# Patient Record
Sex: Male | Born: 1950 | Race: White | Hispanic: No | Smoking: Former smoker
Health system: Southern US, Community
[De-identification: ages and names within clinical notes are randomized; demographics above are authoritative.]

## PROBLEM LIST (undated history)

## (undated) DIAGNOSIS — K7682 Hepatic encephalopathy: Secondary | ICD-10-CM

## (undated) DIAGNOSIS — H3320 Serous retinal detachment, unspecified eye: Secondary | ICD-10-CM

## (undated) DIAGNOSIS — Z992 Dependence on renal dialysis: Secondary | ICD-10-CM

## (undated) DIAGNOSIS — K625 Hemorrhage of anus and rectum: Secondary | ICD-10-CM

## (undated) DIAGNOSIS — N186 End stage renal disease: Secondary | ICD-10-CM

## (undated) DIAGNOSIS — I781 Nevus, non-neoplastic: Secondary | ICD-10-CM

## (undated) DIAGNOSIS — K7290 Hepatic failure, unspecified without coma: Secondary | ICD-10-CM

## (undated) DIAGNOSIS — K922 Gastrointestinal hemorrhage, unspecified: Secondary | ICD-10-CM

## (undated) DIAGNOSIS — I8500 Esophageal varices without bleeding: Secondary | ICD-10-CM

## (undated) DIAGNOSIS — J189 Pneumonia, unspecified organism: Secondary | ICD-10-CM

## (undated) DIAGNOSIS — I10 Essential (primary) hypertension: Secondary | ICD-10-CM

## (undated) DIAGNOSIS — K709 Alcoholic liver disease, unspecified: Secondary | ICD-10-CM

## (undated) HISTORY — PX: OTHER SURGICAL HISTORY: SHX169

## (undated) HISTORY — DX: Hepatic encephalopathy: K76.82

## (undated) HISTORY — DX: Alcoholic liver disease, unspecified: K70.9

## (undated) HISTORY — DX: Dependence on renal dialysis: Z99.2

## (undated) HISTORY — DX: Essential (primary) hypertension: I10

## (undated) HISTORY — DX: Hemorrhage of anus and rectum: K62.5

## (undated) HISTORY — DX: Esophageal varices without bleeding: I85.00

## (undated) HISTORY — DX: Gastrointestinal hemorrhage, unspecified: K92.2

## (undated) HISTORY — PX: ESOPHAGOGASTRODUODENOSCOPY: SHX1529

## (undated) HISTORY — DX: Hepatic failure, unspecified without coma: K72.90

---

## 1999-03-03 ENCOUNTER — Encounter: Payer: Self-pay | Admitting: Gastroenterology

## 1999-03-03 ENCOUNTER — Inpatient Hospital Stay (HOSPITAL_COMMUNITY): Admission: EM | Admit: 1999-03-03 | Discharge: 1999-03-06 | Payer: Self-pay | Admitting: Emergency Medicine

## 1999-03-05 ENCOUNTER — Encounter: Payer: Self-pay | Admitting: Gastroenterology

## 2001-11-30 ENCOUNTER — Encounter: Payer: Self-pay | Admitting: Urology

## 2001-11-30 ENCOUNTER — Ambulatory Visit (HOSPITAL_COMMUNITY): Admission: RE | Admit: 2001-11-30 | Discharge: 2001-11-30 | Payer: Self-pay | Admitting: Urology

## 2001-12-22 ENCOUNTER — Encounter: Admission: RE | Admit: 2001-12-22 | Discharge: 2001-12-22 | Payer: Self-pay | Admitting: Sports Medicine

## 2001-12-22 ENCOUNTER — Encounter: Payer: Self-pay | Admitting: Sports Medicine

## 2001-12-28 ENCOUNTER — Encounter: Admission: RE | Admit: 2001-12-28 | Discharge: 2001-12-28 | Payer: Self-pay | Admitting: Infectious Diseases

## 2002-01-01 ENCOUNTER — Ambulatory Visit (HOSPITAL_COMMUNITY): Admission: RE | Admit: 2002-01-01 | Discharge: 2002-01-01 | Payer: Self-pay | Admitting: Neurosurgery

## 2002-01-03 ENCOUNTER — Inpatient Hospital Stay (HOSPITAL_COMMUNITY): Admission: EM | Admit: 2002-01-03 | Discharge: 2002-01-05 | Payer: Self-pay | Admitting: Emergency Medicine

## 2002-01-03 ENCOUNTER — Encounter: Admission: RE | Admit: 2002-01-03 | Discharge: 2002-01-03 | Payer: Self-pay | Admitting: Infectious Diseases

## 2002-01-04 ENCOUNTER — Encounter: Payer: Self-pay | Admitting: Internal Medicine

## 2002-01-05 ENCOUNTER — Encounter: Payer: Self-pay | Admitting: Internal Medicine

## 2002-02-19 ENCOUNTER — Encounter: Admission: RE | Admit: 2002-02-19 | Discharge: 2002-02-19 | Payer: Self-pay | Admitting: Internal Medicine

## 2002-03-19 ENCOUNTER — Encounter: Admission: RE | Admit: 2002-03-19 | Discharge: 2002-03-19 | Payer: Self-pay | Admitting: Infectious Diseases

## 2003-06-09 ENCOUNTER — Inpatient Hospital Stay (HOSPITAL_COMMUNITY): Admission: AD | Admit: 2003-06-09 | Discharge: 2003-06-11 | Payer: Self-pay | Admitting: Family Medicine

## 2007-03-23 ENCOUNTER — Inpatient Hospital Stay (HOSPITAL_COMMUNITY): Admission: EM | Admit: 2007-03-23 | Discharge: 2007-03-25 | Payer: Self-pay | Admitting: Emergency Medicine

## 2007-03-24 ENCOUNTER — Encounter (INDEPENDENT_AMBULATORY_CARE_PROVIDER_SITE_OTHER): Payer: Self-pay | Admitting: Internal Medicine

## 2007-04-19 ENCOUNTER — Ambulatory Visit: Payer: Self-pay | Admitting: Cardiology

## 2007-07-31 ENCOUNTER — Emergency Department (HOSPITAL_COMMUNITY): Admission: EM | Admit: 2007-07-31 | Discharge: 2007-07-31 | Payer: Self-pay | Admitting: Emergency Medicine

## 2009-09-02 ENCOUNTER — Ambulatory Visit: Payer: Self-pay | Admitting: Cardiology

## 2009-12-01 ENCOUNTER — Inpatient Hospital Stay (HOSPITAL_COMMUNITY)
Admission: EM | Admit: 2009-12-01 | Discharge: 2009-12-06 | Payer: Self-pay | Source: Home / Self Care | Admitting: Emergency Medicine

## 2009-12-02 ENCOUNTER — Ambulatory Visit: Payer: Self-pay | Admitting: Cardiology

## 2009-12-02 ENCOUNTER — Encounter (INDEPENDENT_AMBULATORY_CARE_PROVIDER_SITE_OTHER): Payer: Self-pay | Admitting: Internal Medicine

## 2009-12-10 HISTORY — PX: LUNG SURGERY: SHX703

## 2010-04-13 ENCOUNTER — Telehealth: Payer: Self-pay | Admitting: Gastroenterology

## 2010-08-13 NOTE — Progress Notes (Signed)
Summary: wants asap appt  ---- Converted from flag ---- ---- 04/13/2010 11:00 AM, Cloria Spring LPN wrote: Timothy Chan, i have received two phone calls on this pt this AM. First his son called and wanted him seen TODAY. I told him we do not do same day appts. He said he is having liver problems and was told to call and see Dr. Jena Gauss. He said he thought he needed fluid drawn off of his stomach. (Last seen here in 2001) I told him if he had an immediate problem he should contact his PCP, who his son said was Dr. Sherril Croon. And then if Dr. Sherril Croon thought he should be seen here he could let the doctors know. A little while later, pt's friend, Timothy Chan, called.  She said he was seen in hospital at [facility] approx 4-6 weeks ago. She  said Dr. Karilyn Cota saw him there and said he should follow up with a liver doctor because he was not a liver doctor. She said pt is having problems with BP especially and it is going real low when he has dialysis and they think it is because of his liver and not his dialysis. please advise! ------------------------------   Agree with above. He should f/u with his PCP for urgent needs. We do not do taps, we arrange all via radiology which his PCP can arrange as well. He is a NUR patient, he should consider arranging appt with NUR, cannot flip back and forth between practices. Otherwise, can use an urgent for next week.  Appended Document: wants asap appt Pt was informed.

## 2010-09-04 ENCOUNTER — Inpatient Hospital Stay (HOSPITAL_COMMUNITY): Payer: Medicare Other

## 2010-09-04 ENCOUNTER — Inpatient Hospital Stay (HOSPITAL_COMMUNITY)
Admission: RE | Admit: 2010-09-04 | Discharge: 2010-09-08 | DRG: 377 | Disposition: A | Discharge status: Home / Self Care | Payer: Medicare Other | Source: Other Acute Inpatient Hospital | Attending: Internal Medicine | Admitting: Internal Medicine

## 2010-09-04 DIAGNOSIS — K219 Gastro-esophageal reflux disease without esophagitis: Secondary | ICD-10-CM | POA: Diagnosis present

## 2010-09-04 DIAGNOSIS — Z992 Dependence on renal dialysis: Secondary | ICD-10-CM

## 2010-09-04 DIAGNOSIS — N039 Chronic nephritic syndrome with unspecified morphologic changes: Secondary | ICD-10-CM | POA: Diagnosis present

## 2010-09-04 DIAGNOSIS — J4489 Other specified chronic obstructive pulmonary disease: Secondary | ICD-10-CM | POA: Diagnosis present

## 2010-09-04 DIAGNOSIS — K31811 Angiodysplasia of stomach and duodenum with bleeding: Secondary | ICD-10-CM | POA: Diagnosis present

## 2010-09-04 DIAGNOSIS — D631 Anemia in chronic kidney disease: Secondary | ICD-10-CM | POA: Diagnosis present

## 2010-09-04 DIAGNOSIS — K766 Portal hypertension: Secondary | ICD-10-CM | POA: Diagnosis present

## 2010-09-04 DIAGNOSIS — F17200 Nicotine dependence, unspecified, uncomplicated: Secondary | ICD-10-CM | POA: Diagnosis present

## 2010-09-04 DIAGNOSIS — K2961 Other gastritis with bleeding: Principal | ICD-10-CM | POA: Diagnosis present

## 2010-09-04 DIAGNOSIS — G8929 Other chronic pain: Secondary | ICD-10-CM | POA: Diagnosis present

## 2010-09-04 DIAGNOSIS — D62 Acute posthemorrhagic anemia: Secondary | ICD-10-CM | POA: Diagnosis present

## 2010-09-04 DIAGNOSIS — E119 Type 2 diabetes mellitus without complications: Secondary | ICD-10-CM | POA: Diagnosis present

## 2010-09-04 DIAGNOSIS — M549 Dorsalgia, unspecified: Secondary | ICD-10-CM | POA: Diagnosis present

## 2010-09-04 DIAGNOSIS — K319 Disease of stomach and duodenum, unspecified: Secondary | ICD-10-CM | POA: Diagnosis present

## 2010-09-04 DIAGNOSIS — N186 End stage renal disease: Secondary | ICD-10-CM | POA: Diagnosis present

## 2010-09-04 DIAGNOSIS — Z79899 Other long term (current) drug therapy: Secondary | ICD-10-CM

## 2010-09-04 DIAGNOSIS — Z86711 Personal history of pulmonary embolism: Secondary | ICD-10-CM

## 2010-09-04 DIAGNOSIS — K7030 Alcoholic cirrhosis of liver without ascites: Secondary | ICD-10-CM | POA: Diagnosis present

## 2010-09-04 DIAGNOSIS — J449 Chronic obstructive pulmonary disease, unspecified: Secondary | ICD-10-CM | POA: Diagnosis present

## 2010-09-04 DIAGNOSIS — I120 Hypertensive chronic kidney disease with stage 5 chronic kidney disease or end stage renal disease: Secondary | ICD-10-CM | POA: Diagnosis present

## 2010-09-04 LAB — GLUCOSE, CAPILLARY: Glucose-Capillary: 158 mg/dL — ABNORMAL HIGH (ref 70–99)

## 2010-09-05 HISTORY — PX: ESOPHAGOGASTRODUODENOSCOPY: SHX1529

## 2010-09-05 LAB — COMPREHENSIVE METABOLIC PANEL
ALT: 14 U/L (ref 0–53)
Alkaline Phosphatase: 82 U/L (ref 39–117)
GFR calc non Af Amer: 13 mL/min — ABNORMAL LOW (ref >60)
Potassium: 3.8 mEq/L (ref 3.5–5.1)
Sodium: 134 mEq/L — ABNORMAL LOW (ref 135–145)

## 2010-09-05 LAB — PHOSPHORUS: Phosphorus: 4.3 mg/dL (ref 2.3–4.6)

## 2010-09-05 LAB — GLUCOSE, CAPILLARY
Glucose-Capillary: 94 mg/dL (ref 70–99)
Glucose-Capillary: 106 mg/dL — ABNORMAL HIGH (ref 70–99)
Glucose-Capillary: 206 mg/dL — ABNORMAL HIGH (ref 70–99)

## 2010-09-05 LAB — HEMOGLOBIN AND HEMATOCRIT, BLOOD
HCT: 26.9 % — ABNORMAL LOW (ref 39.0–52.0)
Hemoglobin: 9.1 g/dL — ABNORMAL LOW (ref 13.0–17.0)

## 2010-09-05 LAB — IRON AND TIBC
TIBC: 189 ug/dL — ABNORMAL LOW (ref 215–435)
UIBC: 151 ug/dL

## 2010-09-05 LAB — HEMOGLOBIN A1C: Mean Plasma Glucose: 123 mg/dL — ABNORMAL HIGH (ref <117)

## 2010-09-05 LAB — APTT: aPTT: 32 seconds (ref 24–37)

## 2010-09-05 LAB — LIPID PANEL
HDL: 33 mg/dL — ABNORMAL LOW (ref >39)
Triglycerides: 58 mg/dL (ref <150)

## 2010-09-05 LAB — MAGNESIUM: Magnesium: 1.9 mg/dL (ref 1.5–2.5)

## 2010-09-06 LAB — COMPREHENSIVE METABOLIC PANEL
ALT: 16 U/L (ref 0–53)
BUN: 27 mg/dL — ABNORMAL HIGH (ref 6–23)
CO2: 27 mEq/L (ref 19–32)
Calcium: 8.5 mg/dL (ref 8.4–10.5)
Creatinine, Ser: 5.85 mg/dL — ABNORMAL HIGH (ref 0.4–1.5)
GFR calc non Af Amer: 10 mL/min — ABNORMAL LOW (ref >60)
Glucose, Bld: 141 mg/dL — ABNORMAL HIGH (ref 70–99)
Sodium: 134 mEq/L — ABNORMAL LOW (ref 135–145)

## 2010-09-06 LAB — CBC
HCT: 28.3 % — ABNORMAL LOW (ref 39.0–52.0)
Hemoglobin: 9.5 g/dL — ABNORMAL LOW (ref 13.0–17.0)
MCH: 29.9 pg (ref 26.0–34.0)
MCHC: 33.6 g/dL (ref 30.0–36.0)
MCV: 89 fL (ref 78.0–100.0)

## 2010-09-06 LAB — AMMONIA: Ammonia: 82 umol/L — ABNORMAL HIGH (ref 11–35)

## 2010-09-06 LAB — GLUCOSE, CAPILLARY: Glucose-Capillary: 204 mg/dL — ABNORMAL HIGH (ref 70–99)

## 2010-09-07 ENCOUNTER — Inpatient Hospital Stay (HOSPITAL_COMMUNITY): Payer: Medicare Other

## 2010-09-07 HISTORY — PX: COLONOSCOPY: SHX174

## 2010-09-07 LAB — COMPREHENSIVE METABOLIC PANEL
ALT: 14 U/L (ref 0–53)
AST: 34 U/L (ref 0–37)
Alkaline Phosphatase: 74 U/L (ref 39–117)
CO2: 25 mEq/L (ref 19–32)
Chloride: 100 mEq/L (ref 96–112)
GFR calc Af Amer: 10 mL/min — ABNORMAL LOW (ref >60)
GFR calc non Af Amer: 9 mL/min — ABNORMAL LOW (ref >60)
Sodium: 134 mEq/L — ABNORMAL LOW (ref 135–145)
Total Bilirubin: 1.1 mg/dL (ref 0.3–1.2)

## 2010-09-07 LAB — AMMONIA: Ammonia: 75 umol/L — ABNORMAL HIGH (ref 11–35)

## 2010-09-07 LAB — CBC
Hemoglobin: 9.2 g/dL — ABNORMAL LOW (ref 13.0–17.0)
MCH: 31.9 pg (ref 26.0–34.0)
MCHC: 35.2 g/dL (ref 30.0–36.0)

## 2010-09-08 LAB — GLUCOSE, CAPILLARY: Glucose-Capillary: 163 mg/dL — ABNORMAL HIGH (ref 70–99)

## 2010-09-08 LAB — CBC
HCT: 26.5 % — ABNORMAL LOW (ref 39.0–52.0)
MCV: 91.4 fL (ref 78.0–100.0)
RBC: 2.9 MIL/uL — ABNORMAL LOW (ref 4.22–5.81)
WBC: 5.7 10*3/uL (ref 4.0–10.5)

## 2010-09-09 NOTE — Consult Note (Signed)
NAMEDEVONTAE, CASASOLA NO.:  [id number]  MEDICAL RECORD NO.:  [id number]           PATIENT TYPE:  I  LOCATION:  2604                         FACILITY:  [facility]  PHYSICIAN:  Willis Modena, MD     DATE OF BIRTH:  26-Oct-1950  DATE OF CONSULTATION:  09/05/2010 DATE OF DISCHARGE:                                CONSULTATION   CONSULTING PHYSICIAN:  Rosanna Randy, MD, of [facility].  REASON FOR CONSULTATION:  Guaiac-positive stools with anemia.  CHIEF COMPLAINT:  Weakness.  HISTORY OF PRESENT ILLNESS:  Mr. Zoeller is a 60 year old gentleman with history of alcohol-related cirrhosis.  His history is challenging, but his son helps to provide some of the background.  Apparently, he was diagnosed many years with alcohol-related cirrhosis and had a TIPS procedure done in [place] for refractory ascites over 10 years ago.  He has had some troubles with encephalopathy recently, according to the family, they have partially occluded his TIPS to help improve this.  He presented to [facility], and subsequently was transferred to [facility], for evaluation of black stool with anemia.  Apparently, he was admitted for weakness and found to have a hemoglobin of 7 that was heme positive.  His hemoglobin did not improve appropriately with transfusion, and he was transferred over to [facility].  The patient denies any hematemesis, but the son tells me he has had some episodes of hematemesis as well as his anemia.  He apparently had an endoscopy many years ago.  He also endorses having a colonoscopy.  None of these reports were available for review.  Past medical history, allergies, past surgical history, home medications, family history, social history, and review of systems all from dictated note from Dr. Gwenlyn Perking.  I have reviewed and I agree.  PHYSICAL EXAMINATION:  GENERAL:  Mr. Cozort is chronically ill appearing.  He appears older  than stated age. SKIN:  Patient has some telangiectasias and ecchymoses consistent with cirrhosis. ENT:  Poor dentition.  Sclerae are anicteric. LUNGS:  Clear to auscultation. HEART:  Regular rhythm. ABDOMEN:  Slightly distended, but no appreciable ascites.  No obviously palpable liver or splenic enlargement. EXTREMITIES:  He has AV fistula in his left wrist.  No peripheral cyanosis or clubbing. NEUROLOGIC:  The patient is somewhat somnolent, but re-arousable, diffusely weak, but nonfocal without lateralizing signs. LYMPHATICS:  No palpable axillary, submandibular, or supraclavicular adenopathy.  LABORATORY STUDIES:  Hemoglobin was 9.1 after transfusion, INR 1.23. Sodium 134, potassium 3.8, chloride 93, bicarb 23, BUN 19, creatinine 4.7, total protein 5.1, albumin 1.9, total bilirubin 1.2, alk phos 82, AST 22, ALT 14.  RADIOLOGY:  Chest x-ray done yesterday showed chronic lung disease.  No acute cardiopulmonary disease.  IMPRESSION:  Mr. Cassarino is a 60 year old gentleman with anemia, heme- positive stool.  There is a report of hematemesis, but I cannot get this corroborated.  He is hemodynamically stable.  He is chronically ill from his cirrhosis.  PLAN: 1. Agree with supportive management with gentle hydration, serial CBCs     and proton pump inhibitor therapy. 2. We will proceed  with endoscopy for further evaluation. 3. If his endoscopy is unrevealing, one can consider colonoscopy.     However, we will need to address his encephalopathy and ability to     tolerate a bowel prep further before we pursue this. 4. We will start antibiotics in the setting of GI bleeding in a     patient with cirrhosis.  Thanks again for allowing me to participate in Mr. Russomanno care.     Willis Modena, MD     WO/MEDQ  D:  09/05/2010  T:  09/06/2010  Job:  [id number]  Electronically Signed by Willis Modena  on 09/09/2010 05:43:32 PM

## 2010-09-16 ENCOUNTER — Inpatient Hospital Stay (HOSPITAL_COMMUNITY)
Admission: EM | Admit: 2010-09-16 | Discharge: 2010-09-18 | DRG: 368 | Disposition: A | Discharge status: Home Health | Payer: Medicare Other | Attending: Internal Medicine | Admitting: Internal Medicine

## 2010-09-16 ENCOUNTER — Emergency Department (HOSPITAL_COMMUNITY): Payer: Medicare Other

## 2010-09-16 DIAGNOSIS — K7290 Hepatic failure, unspecified without coma: Secondary | ICD-10-CM | POA: Diagnosis present

## 2010-09-16 DIAGNOSIS — K7682 Hepatic encephalopathy: Secondary | ICD-10-CM | POA: Diagnosis present

## 2010-09-16 DIAGNOSIS — D638 Anemia in other chronic diseases classified elsewhere: Secondary | ICD-10-CM | POA: Diagnosis present

## 2010-09-16 DIAGNOSIS — F1011 Alcohol abuse, in remission: Secondary | ICD-10-CM | POA: Diagnosis present

## 2010-09-16 DIAGNOSIS — N186 End stage renal disease: Secondary | ICD-10-CM | POA: Diagnosis present

## 2010-09-16 DIAGNOSIS — Z86718 Personal history of other venous thrombosis and embolism: Secondary | ICD-10-CM

## 2010-09-16 DIAGNOSIS — K7030 Alcoholic cirrhosis of liver without ascites: Secondary | ICD-10-CM | POA: Diagnosis present

## 2010-09-16 DIAGNOSIS — E119 Type 2 diabetes mellitus without complications: Secondary | ICD-10-CM | POA: Diagnosis present

## 2010-09-16 DIAGNOSIS — M549 Dorsalgia, unspecified: Secondary | ICD-10-CM | POA: Diagnosis present

## 2010-09-16 DIAGNOSIS — K226 Gastro-esophageal laceration-hemorrhage syndrome: Principal | ICD-10-CM | POA: Diagnosis present

## 2010-09-16 DIAGNOSIS — K219 Gastro-esophageal reflux disease without esophagitis: Secondary | ICD-10-CM | POA: Diagnosis present

## 2010-09-16 DIAGNOSIS — G8929 Other chronic pain: Secondary | ICD-10-CM | POA: Diagnosis present

## 2010-09-16 DIAGNOSIS — I120 Hypertensive chronic kidney disease with stage 5 chronic kidney disease or end stage renal disease: Secondary | ICD-10-CM | POA: Diagnosis present

## 2010-09-16 DIAGNOSIS — R040 Epistaxis: Secondary | ICD-10-CM | POA: Diagnosis present

## 2010-09-16 DIAGNOSIS — Z992 Dependence on renal dialysis: Secondary | ICD-10-CM

## 2010-09-16 LAB — COMPREHENSIVE METABOLIC PANEL
ALT: 18 U/L (ref 0–53)
AST: 29 U/L (ref 0–37)
Alkaline Phosphatase: 86 U/L (ref 39–117)
CO2: 23 mEq/L (ref 19–32)
GFR calc Af Amer: 9 mL/min — ABNORMAL LOW (ref >60)
GFR calc non Af Amer: 7 mL/min — ABNORMAL LOW (ref >60)
Glucose, Bld: 139 mg/dL — ABNORMAL HIGH (ref 70–99)
Potassium: 5 mEq/L (ref 3.5–5.1)
Sodium: 139 mEq/L (ref 135–145)
Total Bilirubin: 0.9 mg/dL (ref 0.3–1.2)
Total Protein: 6.2 g/dL (ref 6.0–8.3)

## 2010-09-16 LAB — DIFFERENTIAL
Basophils Absolute: 0.1 10*3/uL (ref 0.0–0.1)
Basophils Relative: 1 % (ref 0–1)
Eosinophils Absolute: 0.2 10*3/uL (ref 0.0–0.7)
Lymphocytes Relative: 36 % (ref 12–46)
Monocytes Relative: 9 % (ref 3–12)
Neutro Abs: 2.5 10*3/uL (ref 1.7–7.7)
Neutrophils Relative %: 50 % (ref 43–77)

## 2010-09-16 LAB — CBC
HCT: 29.9 % — ABNORMAL LOW (ref 39.0–52.0)
Hemoglobin: 9.8 g/dL — ABNORMAL LOW (ref 13.0–17.0)
RBC: 3.16 MIL/uL — ABNORMAL LOW (ref 4.22–5.81)
WBC: 4.9 10*3/uL (ref 4.0–10.5)

## 2010-09-16 LAB — AMMONIA: Ammonia: 77 umol/L — ABNORMAL HIGH (ref 11–35)

## 2010-09-16 LAB — MRSA PCR SCREENING: MRSA by PCR: NEGATIVE

## 2010-09-16 LAB — PROTIME-INR: Prothrombin Time: 14.8 seconds (ref 11.6–15.2)

## 2010-09-16 LAB — HEMOGLOBIN AND HEMATOCRIT, BLOOD: HCT: 27.1 % — ABNORMAL LOW (ref 39.0–52.0)

## 2010-09-17 ENCOUNTER — Inpatient Hospital Stay (HOSPITAL_COMMUNITY): Payer: Medicare Other

## 2010-09-17 DIAGNOSIS — K7030 Alcoholic cirrhosis of liver without ascites: Secondary | ICD-10-CM

## 2010-09-17 DIAGNOSIS — Z8719 Personal history of other diseases of the digestive system: Secondary | ICD-10-CM

## 2010-09-17 DIAGNOSIS — N186 End stage renal disease: Secondary | ICD-10-CM

## 2010-09-17 DIAGNOSIS — K920 Hematemesis: Secondary | ICD-10-CM

## 2010-09-17 LAB — GLUCOSE, CAPILLARY
Glucose-Capillary: 103 mg/dL — ABNORMAL HIGH (ref 70–99)
Glucose-Capillary: 132 mg/dL — ABNORMAL HIGH (ref 70–99)
Glucose-Capillary: 143 mg/dL — ABNORMAL HIGH (ref 70–99)
Glucose-Capillary: 86 mg/dL (ref 70–99)

## 2010-09-17 LAB — HEMOGLOBIN AND HEMATOCRIT, BLOOD
HCT: 26 % — ABNORMAL LOW (ref 39.0–52.0)
HCT: 26.5 % — ABNORMAL LOW (ref 39.0–52.0)
HCT: 25.3 % — ABNORMAL LOW (ref 39.0–52.0)
Hemoglobin: 8.7 g/dL — ABNORMAL LOW (ref 13.0–17.0)
Hemoglobin: 8.7 g/dL — ABNORMAL LOW (ref 13.0–17.0)
Hemoglobin: 8.3 g/dL — ABNORMAL LOW (ref 13.0–17.0)

## 2010-09-17 LAB — BASIC METABOLIC PANEL
BUN: 57 mg/dL — ABNORMAL HIGH (ref 6–23)
Chloride: 110 mEq/L (ref 96–112)
Creatinine, Ser: 7.75 mg/dL — ABNORMAL HIGH (ref 0.4–1.5)
Glucose, Bld: 97 mg/dL (ref 70–99)
Potassium: 4.4 mEq/L (ref 3.5–5.1)

## 2010-09-17 LAB — MAGNESIUM: Magnesium: 2.3 mg/dL (ref 1.5–2.5)

## 2010-09-17 NOTE — Op Note (Signed)
  NAMEGUTHRIE, LEMME                  ACCOUNT NO.:  [id number]  MEDICAL RECORD NO.:  [id number]           PATIENT TYPE:  I  LOCATION:  6730                         FACILITY:  [facility]  PHYSICIAN:  Jebediah Macrae C. Madilyn Fireman, M.D.    DATE OF BIRTH:  1951-01-12  DATE OF PROCEDURE:  09/07/2010 DATE OF DISCHARGE:                              OPERATIVE REPORT   INDICATIONS FOR PROCEDURE:  Heme-positive stool and anemia.  PROCEDURE:  The patient was placed in the left lateral decubitus position and placed on the pulse monitor with continuous low-flow oxygen delivered by nasal cannula.  He was sedated with 50 mcg of IV fentanyl and 4 mg of IV Versed.  Olympus video colonoscope was inserted into the rectum and advanced to the cecum, confirmed by transillumination of McBurney point and visualization of the ileocecal valve and appendiceal orifice.  The prep was fair, but I could not rule out polyps less than 2 cm in all locations.  Otherwise cecum, ascending, transverse, descending, and sigmoid colon all appeared normal with no masses, polyps, diverticula, or other mucosal abnormalities.  The rectum likewise appeared normal and retroflexed view of the anus revealed no obvious internal hemorrhoids.  The scope was then withdrawn and the patient returned to the recovery room in a stable condition.  He tolerated the procedure well and there were no immediate complications.  IMPRESSION:  Normal colonoscopy.  PLAN:  Repeat colonoscopy in 10 years for screening purposes.          ______________________________ Everardo All Madilyn Fireman, M.D.     JCH/MEDQ  D:  09/07/2010  T:  09/08/2010  Job:  [id number]  Electronically Signed by Dorena Cookey M.D. on 09/15/2010 07:10:55 PM

## 2010-09-18 LAB — CBC
HCT: 25 % — ABNORMAL LOW (ref 39.0–52.0)
Hemoglobin: 8.2 g/dL — ABNORMAL LOW (ref 13.0–17.0)
RBC: 2.7 MIL/uL — ABNORMAL LOW (ref 4.22–5.81)
WBC: 5 10*3/uL (ref 4.0–10.5)

## 2010-09-18 LAB — HEMOGLOBIN AND HEMATOCRIT, BLOOD
HCT: 25.1 % — ABNORMAL LOW (ref 39.0–52.0)
Hemoglobin: 8.1 g/dL — ABNORMAL LOW (ref 13.0–17.0)

## 2010-09-18 LAB — BASIC METABOLIC PANEL
CO2: 27 mEq/L (ref 19–32)
GFR calc Af Amer: 14 mL/min — ABNORMAL LOW (ref >60)
Glucose, Bld: 90 mg/dL (ref 70–99)
Potassium: 3.7 mEq/L (ref 3.5–5.1)
Sodium: 139 mEq/L (ref 135–145)

## 2010-09-18 LAB — DIFFERENTIAL
Basophils Absolute: 0.1 10*3/uL (ref 0.0–0.1)
Basophils Relative: 1 % (ref 0–1)
Eosinophils Absolute: 0.2 10*3/uL (ref 0.0–0.7)
Eosinophils Relative: 3 % (ref 0–5)
Lymphs Abs: 1.6 10*3/uL (ref 0.7–4.0)
Neutrophils Relative %: 52 % (ref 43–77)

## 2010-09-18 LAB — GLUCOSE, CAPILLARY: Glucose-Capillary: 89 mg/dL (ref 70–99)

## 2010-09-20 NOTE — Consult Note (Signed)
NAMEANZEL, KEARSE NO.:  [id number]  MEDICAL RECORD NO.:  [id number]           PATIENT TYPE:  LOCATION:                                 FACILITY:  PHYSICIAN:  Jorja Loa, M.D.DATE OF BIRTH:  06-01-1951  DATE OF CONSULTATION: DATE OF DISCHARGE:                                CONSULTATION   The patient is admitted to the hospitalist group.  REASON FOR CONSULTATION:  End-stage renal disease for hemodialysis.  Timothy Chan is a 60 years old gentleman who has history of end-stage renal disease on maintenance hemodialysis Monday, Wednesday, and Friday, history of alcoholic liver cirrhosis, portal hypertension and status post TIPS  placement and removal.  Presently, came with complaints of hematemesis and also dark stools for a couple of days.  Timothy Chan had similar problem in February about 3 weeks ago, where he was admitted to [facility].  During that time, the patient had colonoscopy and also endoscopy.  The only finding was some mild gastritis and the patient was discharged home.  Presently, he came back as stated above with some dark stool and also with cough and he said he has seen some blood with his sputum.  He denies any nausea or vomiting at this moment. He is feeling better.  He said, he did not have any bowel movement today.  He denies any fevers, chills, or sweating.  PAST MEDICAL HISTORY: 1. He has history of alcoholic liver cirrhosis. 2. History of portal hypertension, status post TIPS placement and     removal because of very high ammonia level uncontrolled on his     medication. 3. History of end-stage renal disease on maintenance hemodialysis     Monday, Wednesday, Friday.  Last dialysis was on Monday. 4. History of diabetes. 5. History of PE. 6. History of chronic back pain. 7. History of hypertension. 8. History of COPD. 9. Previous history of osteomyelitis. 10.History of GERD. 11.Occasional recurrent hepatic  encephalopathy. 12.History of anemia.  SURGICAL HISTORY:  He is status post TIPS placement and status post removal.  His medication include insulin sliding scale.  He is also on lactulose 22.5 mL t.i.d.  He is also on Protonix 80 mg IV daily.  He is also on Zofran 4 mg on a p.r.n. basis. as an outpatient.  However, the patient was also on metronidazole 250 mg twice a day, and rifaximin 550 mg one tablet p.o. b.i.d.  He is also getting Fosrenol 1000 mg half tablet p.o. t.i.d. with snack and because of his hypokalemia he is also getting 20 mEq of KCl for dialysis.  He is on 3K bath, and he is on sodium bicarb 650 mg twice a day and folic acid 1 tablet p.o. t.i.d. p.o. daily.  SOCIAL HISTORY:  He has history of smoking, but he has stopped alcohol use or illicit drug use.  FAMILY HISTORY:  History of renal failure.  REVIEW OF SYSTEMS:  He is feeling better, he is asking, have to eat some food.  He does not have any nausea.  No vomiting.  He denies any chest pain.  He  denies also any urgency or frequency.  He denies any shortness of breath, orthopnea, paroxysmal nocturnal dyspnea.  Overall, he said since this morning he did not offer any complaints.  PHYSICAL EXAMINATION:  VITAL SIGNS:  The patient's blood pressure is 126/57, pulse of 75. HEENT:  No conjunctival pallor.  No icterus.  Oral mucosa seems to be moist.  Chest is clear to auscultation. HEART:  Reveals regular rate and rhythm.  No murmur.  No S3. ABDOMEN:  Soft and positive bowel sounds. EXTREMITIES:  No edema.  LABORATORY DATA:  His white blood cell count is 4.9, hemoglobin 8.4, hematocrit 26, seems to slowly declining and platelet is 121.  Sodium 141, potassium 4.4, BUN is 57, creatinine 7.7 and his phosphorus is 6.8. His ammonia level is 77.  ASSESSMENT: 1. Nausea and also vomiting of blood which seems to be getting better.     He had similar issue as stated above in February, he had endoscopy     and colonoscopy,  possible gastritis but no significant finding.     Presently, still his H and H seems to be declining. 2. History of alcoholic liver cirrhosis. 3. History of end-stage renal disease.  He is on maintenance     hemodialysis Monday, Wednesday, Friday.  His last dialysis on     Monday. 4. History of diabetes. 5. History of previous pulmonary embolism and he was on Coumadin.     Presently, he is off. 6. History of  gastroesophageal reflux disease. 7. History of chronic back pain. 8. History of hepatic encephalopathy.  He is on lactulose and also on     p.o. antibiotics. 9. Hyperphosphatemia.  The patient was on Fosrenol 500 mg p.o. his     phosphorus seems to be somewhat high at this moment, he is not on     any binder. 10.History of status post TIPS removal.  RECOMMENDATIONS:  We will make arrangement for the patient to get dialysis.  At this moment, we will try to hold heparin, we will give him some Epogen once the patient is able to eat, probably will put him back on binders.  The patient probably also may need to be put back on other medications once he is able to eat.     Jorja Loa, M.D.    BB/MEDQ  D:  09/17/2010  T:  09/18/2010  Job:  [id number]  Electronically Signed by Jorja Loa M.D. on 09/20/2010 08:35:12 AM

## 2010-09-28 LAB — COMPREHENSIVE METABOLIC PANEL
AST: 25 U/L (ref 0–37)
Albumin: 2.1 g/dL — ABNORMAL LOW (ref 3.5–5.2)
Alkaline Phosphatase: 96 U/L (ref 39–117)
BUN: 44 mg/dL — ABNORMAL HIGH (ref 6–23)
GFR calc Af Amer: 16 mL/min — ABNORMAL LOW (ref >60)
Potassium: 3.9 mEq/L (ref 3.5–5.1)
Total Protein: 6.2 g/dL (ref 6.0–8.3)

## 2010-09-28 LAB — BASIC METABOLIC PANEL
BUN: 43 mg/dL — ABNORMAL HIGH (ref 6–23)
BUN: 50 mg/dL — ABNORMAL HIGH (ref 6–23)
BUN: 52 mg/dL — ABNORMAL HIGH (ref 6–23)
CO2: 18 mEq/L — ABNORMAL LOW (ref 19–32)
CO2: 23 mEq/L (ref 19–32)
Calcium: 8.1 mg/dL — ABNORMAL LOW (ref 8.4–10.5)
Calcium: 8.1 mg/dL — ABNORMAL LOW (ref 8.4–10.5)
Calcium: 8.1 mg/dL — ABNORMAL LOW (ref 8.4–10.5)
Chloride: 113 mEq/L — ABNORMAL HIGH (ref 96–112)
Creatinine, Ser: 4.58 mg/dL — ABNORMAL HIGH (ref 0.4–1.5)
Creatinine, Ser: 5.43 mg/dL — ABNORMAL HIGH (ref 0.4–1.5)
Creatinine, Ser: 5.52 mg/dL — ABNORMAL HIGH (ref 0.4–1.5)
GFR calc Af Amer: 13 mL/min — ABNORMAL LOW (ref >60)
GFR calc Af Amer: 13 mL/min — ABNORMAL LOW (ref >60)
GFR calc non Af Amer: 11 mL/min — ABNORMAL LOW (ref >60)
GFR calc non Af Amer: 12 mL/min — ABNORMAL LOW (ref >60)
GFR calc non Af Amer: 13 mL/min — ABNORMAL LOW (ref >60)
Glucose, Bld: 136 mg/dL — ABNORMAL HIGH (ref 70–99)
Glucose, Bld: 143 mg/dL — ABNORMAL HIGH (ref 70–99)
Glucose, Bld: 156 mg/dL — ABNORMAL HIGH (ref 70–99)
Potassium: 3.3 mEq/L — ABNORMAL LOW (ref 3.5–5.1)

## 2010-09-28 LAB — CROSSMATCH
ABO/RH(D): B NEG
Antibody Screen: NEGATIVE

## 2010-09-28 LAB — RENAL FUNCTION PANEL
Albumin: 1.9 g/dL — ABNORMAL LOW (ref 3.5–5.2)
BUN: 48 mg/dL — ABNORMAL HIGH (ref 6–23)
Calcium: 8.3 mg/dL — ABNORMAL LOW (ref 8.4–10.5)
Creatinine, Ser: 4.6 mg/dL — ABNORMAL HIGH (ref 0.4–1.5)
Glucose, Bld: 124 mg/dL — ABNORMAL HIGH (ref 70–99)
Phosphorus: 4.2 mg/dL (ref 2.3–4.6)
Potassium: 3.3 mEq/L — ABNORMAL LOW (ref 3.5–5.1)

## 2010-09-28 LAB — GLUCOSE, CAPILLARY
Glucose-Capillary: 165 mg/dL — ABNORMAL HIGH (ref 70–99)
Glucose-Capillary: 167 mg/dL — ABNORMAL HIGH (ref 70–99)
Glucose-Capillary: 181 mg/dL — ABNORMAL HIGH (ref 70–99)
Glucose-Capillary: 205 mg/dL — ABNORMAL HIGH (ref 70–99)
Glucose-Capillary: 208 mg/dL — ABNORMAL HIGH (ref 70–99)
Glucose-Capillary: 212 mg/dL — ABNORMAL HIGH (ref 70–99)
Glucose-Capillary: 222 mg/dL — ABNORMAL HIGH (ref 70–99)

## 2010-09-28 LAB — HEPATIC FUNCTION PANEL
ALT: 19 U/L (ref 0–53)
AST: 27 U/L (ref 0–37)
Albumin: 2.2 g/dL — ABNORMAL LOW (ref 3.5–5.2)
Alkaline Phosphatase: 101 U/L (ref 39–117)
Indirect Bilirubin: 0.8 mg/dL (ref 0.3–0.9)
Total Protein: 6.6 g/dL (ref 6.0–8.3)

## 2010-09-28 LAB — CBC
HCT: 24.9 % — ABNORMAL LOW (ref 39.0–52.0)
HCT: 26 % — ABNORMAL LOW (ref 39.0–52.0)
HCT: 27.9 % — ABNORMAL LOW (ref 39.0–52.0)
Hemoglobin: 8.6 g/dL — ABNORMAL LOW (ref 13.0–17.0)
MCHC: 34.9 g/dL (ref 30.0–36.0)
MCHC: 35.7 g/dL (ref 30.0–36.0)
MCV: 93.2 fL (ref 78.0–100.0)
MCV: 95.2 fL (ref 78.0–100.0)
Platelets: 110 10*3/uL — ABNORMAL LOW (ref 150–400)
Platelets: 118 10*3/uL — ABNORMAL LOW (ref 150–400)
Platelets: 119 10*3/uL — ABNORMAL LOW (ref 150–400)
Platelets: 138 10*3/uL — ABNORMAL LOW (ref 150–400)
Platelets: 149 10*3/uL — ABNORMAL LOW (ref 150–400)
RBC: 2.45 MIL/uL — ABNORMAL LOW (ref 4.22–5.81)
RBC: 2.59 MIL/uL — ABNORMAL LOW (ref 4.22–5.81)
RDW: 14.7 % (ref 11.5–15.5)
RDW: 14.8 % (ref 11.5–15.5)
RDW: 15.3 % (ref 11.5–15.5)
RDW: 15.4 % (ref 11.5–15.5)
WBC: 17.1 10*3/uL — ABNORMAL HIGH (ref 4.0–10.5)
WBC: 8.7 10*3/uL (ref 4.0–10.5)
WBC: 9.3 10*3/uL (ref 4.0–10.5)
WBC: 9.5 10*3/uL (ref 4.0–10.5)

## 2010-09-28 LAB — DIFFERENTIAL
Band Neutrophils: 1 % (ref 0–10)
Basophils Absolute: 0.1 10*3/uL (ref 0.0–0.1)
Basophils Absolute: 0.1 10*3/uL (ref 0.0–0.1)
Basophils Absolute: 0.1 10*3/uL (ref 0.0–0.1)
Basophils Relative: 1 % (ref 0–1)
Basophils Relative: 1 % (ref 0–1)
Basophils Relative: 1 % (ref 0–1)
Eosinophils Absolute: 0.1 10*3/uL (ref 0.0–0.7)
Eosinophils Absolute: 0.2 10*3/uL (ref 0.0–0.7)
Eosinophils Relative: 0 % (ref 0–5)
Eosinophils Relative: 1 % (ref 0–5)
Eosinophils Relative: 2 % (ref 0–5)
Eosinophils Relative: 3 % (ref 0–5)
Lymphocytes Relative: 12 % (ref 12–46)
Lymphocytes Relative: 14 % (ref 12–46)
Lymphocytes Relative: 32 % (ref 12–46)
Lymphs Abs: 1.9 10*3/uL (ref 0.7–4.0)
Lymphs Abs: 3.5 10*3/uL (ref 0.7–4.0)
Monocytes Absolute: 1.2 10*3/uL — ABNORMAL HIGH (ref 0.1–1.0)
Monocytes Absolute: 1.8 10*3/uL — ABNORMAL HIGH (ref 0.1–1.0)
Monocytes Relative: 12 % (ref 3–12)
Monocytes Relative: 13 % — ABNORMAL HIGH (ref 3–12)
Monocytes Relative: 2 % — ABNORMAL LOW (ref 3–12)
Neutro Abs: 4 10*3/uL (ref 1.7–7.7)
Neutro Abs: 4.9 10*3/uL (ref 1.7–7.7)
Neutro Abs: 8.2 10*3/uL — ABNORMAL HIGH (ref 1.7–7.7)
Neutrophils Relative %: 44 % (ref 43–77)
Neutrophils Relative %: 60 % (ref 43–77)
Neutrophils Relative %: 63 % (ref 43–77)
Neutrophils Relative %: 84 % — ABNORMAL HIGH (ref 43–77)

## 2010-09-28 LAB — TSH: TSH: 1.621 u[IU]/mL (ref 0.350–4.500)

## 2010-09-28 LAB — CULTURE, BLOOD (ROUTINE X 2)
Culture: NO GROWTH
Report Status: 5312011
Report Status: 5312011

## 2010-09-28 LAB — PROTIME-INR
INR: 1.25 (ref 0.00–1.49)
INR: 1.28 (ref 0.00–1.49)
INR: 1.86 — ABNORMAL HIGH (ref 0.00–1.49)
INR: 2.95 — ABNORMAL HIGH (ref 0.00–1.49)
Prothrombin Time: 15.6 seconds — ABNORMAL HIGH (ref 11.6–15.2)
Prothrombin Time: 15.9 seconds — ABNORMAL HIGH (ref 11.6–15.2)
Prothrombin Time: 30.5 seconds — ABNORMAL HIGH (ref 11.6–15.2)

## 2010-09-28 LAB — LIPID PANEL
Cholesterol: 152 mg/dL (ref 0–200)
LDL Cholesterol: 72 mg/dL (ref 0–99)

## 2010-09-28 LAB — CARDIAC PANEL(CRET KIN+CKTOT+MB+TROPI)
CK, MB: 3 ng/mL (ref 0.3–4.0)
CK, MB: 3.2 ng/mL (ref 0.3–4.0)
Relative Index: INVALID (ref 0.0–2.5)
Total CK: 83 U/L (ref 7–232)
Total CK: 86 U/L (ref 7–232)
Troponin I: 0.02 ng/mL (ref 0.00–0.06)
Troponin I: 0.02 ng/mL (ref 0.00–0.06)

## 2010-09-28 LAB — IRON AND TIBC
Iron: 48 ug/dL (ref 42–135)
Saturation Ratios: 33 % (ref 20–55)
UIBC: 96 ug/dL

## 2010-09-28 LAB — HEPARIN LEVEL (UNFRACTIONATED)
Heparin Unfractionated: 0.21 IU/mL — ABNORMAL LOW (ref 0.30–0.70)
Heparin Unfractionated: 0.34 IU/mL (ref 0.30–0.70)
Heparin Unfractionated: 0.38 IU/mL (ref 0.30–0.70)
Heparin Unfractionated: 0.45 IU/mL (ref 0.30–0.70)
Heparin Unfractionated: 0.98 IU/mL — ABNORMAL HIGH (ref 0.30–0.70)
Heparin Unfractionated: 1.5 IU/mL — ABNORMAL HIGH (ref 0.30–0.70)
Heparin Unfractionated: <0.1 IU/mL — ABNORMAL LOW (ref 0.30–0.70)

## 2010-09-28 LAB — D-DIMER, QUANTITATIVE: D-Dimer, Quant: 0.68 ug/mL-FEU — ABNORMAL HIGH (ref 0.00–0.48)

## 2010-09-28 LAB — BRAIN NATRIURETIC PEPTIDE: Pro B Natriuretic peptide (BNP): 109 pg/mL — ABNORMAL HIGH (ref 0.0–100.0)

## 2010-09-28 LAB — POCT CARDIAC MARKERS

## 2010-09-28 LAB — VITAMIN B12: Vitamin B-12: 726 pg/mL (ref 211–911)

## 2010-09-30 NOTE — Discharge Summary (Signed)
[name], [name] NO.:  [id number]  MEDICAL RECORD NO.:  [id number]           PATIENT TYPE:  I  LOCATION:  6730                         FACILITY:  [facility]  PHYSICIAN:  Clydia Llano, MD       DATE OF BIRTH:  [birth year]  DATE OF ADMISSION:  09/04/2010 DATE OF DISCHARGE:                              DISCHARGE SUMMARY   PRIMARY CARE PHYSICIAN:  Everardo Pacific in [place], [place].  REASON FOR ADMISSION:  GI bleed.  DISCHARGE DIAGNOSES: 1. Gastrointestinal bleed probably secondary to mild gastritis versus     mild gastric antral vascular ectasia. 2. Anemia. 3. End-stage renal disease on hemodialysis. 4. Liver cirrhosis. 5. Status post transjugular intrahepatic portosystemic shunt. 6. History of alcohol abuse. 7. History of diabetes mellitus type 2. 8. Hypertension. 9. Chronic back pain. 10.Chronic obstructive pulmonary disease. 11.History of L1-L2 osteomyelitis in 2003. 12.Gastroesophageal reflux disease.  CONSULTS: 1. Willis Modena, [facility]. 2. [employer].  PROCEDURES: 1. EGD showed:     a.     Mild portal hypertensive gastropathy.     b.     Mild linear antral gastritis versus mild GAVE. 2. Otherwise, normal endoscopy.  No evidence of old or fresh blood. 3. Colonoscopy:  Normal colonoscopy. 4. Radiology, September 04, 2010, chest x-ray showed chronic lung     disease including right basilar pleural scarring and/or chronic     small effusion adjacent airspace disease, appears stable.  BRIEF HISTORY EXAMINATION:  Timothy Chan is 60 year old Caucasian gentleman with history of alcohol-related cirrhosis.  The patient appears to have TIPS procedure done for refractory ascites over 10 years ago.  The patient developed encephalopathy from that and according to the family, they have partially occluded his TIPS to improve his mentation.  The patient presented to [facility] and then subsequently transferred to [facility] after he had dark stools.  The patient admitted at that time for weakness and found to have hemoglobin of 7 and his Hemoccult was positive.  He did have transfusion over there and his hemoglobin did not improve, so transferred over to [facility] for further evaluation.  Upon admission, the patient denies hematemesis, denies fever, denies chills.  BRIEF HOSPITAL COURSE: 1. GI bleed.  The patient admitted to the hospital to telemetry.     Gastroenterology was consulted.  EGD was done on September 05, 2010,     which showed mild antral gastritis versus gastric antral vascular     ectasia and according to GI, this probably can explain the anemia     and Hemoccult positive stools.  The patient also had colonoscopy     which appears to be normal.  GI recommended ciprofloxacin for 7     days because of patient cirrhosis with a concurrent GI bleed.  They     recommend also iron supplementation.  The patient got IV iron with     dialysis and probably he will get iron with dialysis afterwards. 2. Anemia.  The patient's anemia is secondary to end-stage renal     disease exacerbated by recent GI bleed.  The patient did have 2     units of blood transfused in the [facility].  The patient's     hemoglobin was stable during his stay in [facility] with     hemoglobin around 9.  On day of discharge, hemoglobin is 9.2.  GI     recommended iron supplementations to get it with dialysis. 3. Liver cirrhosis.  The patient's cirrhosis is stable.  Continue on     his lactulose.  The patient has TIPS procedure done for refractory     ascites. 4. Diabetes mellitus.  His diabetes is well controlled with hemoglobin     A1c 5.9.  His home medication were continued throughout the     hospital stay and patient CBGs were within the accepted limits. 5. End-stage renal disease.  Nephrology were consulted for a routine     hemodialysis.  The patient was seen by Nephrology, dialyzed twice      while he was in the hospital.  Please note that the patient wants to go home.  He is pushing for that. He will have his dialysis this afternoon and afterwards, he will be able to go home.  The patient refused to stay overnight to check his hemoglobin after his colonoscopy were negative.  DISCHARGE INSTRUCTIONS: 1. Diet, renal carbohydrate-modified diet. 2. Activity as tolerated. 3. Disposition, home.  Follow up with hemodialysis.     Clydia Llano, MD     ME/MEDQ  D:  09/07/2010  T:  09/08/2010  Job:  [id number]  cc:   Dr. Dellie Burns, MD  Electronically Signed by Clydia Llano  on 09/30/2010 07:59:11 PM

## 2010-09-30 NOTE — H&P (Signed)
  Timothy Chan, Timothy Chan NO.:  [id number]  MEDICAL RECORD NO.:  [id number]           PATIENT TYPE:  I  LOCATION:  IC11                          FACILITY:  [facility]  PHYSICIAN:  Houston Siren, MD           DATE OF BIRTH:  04/28/1951  DATE OF ADMISSION:  09/16/2010 DATE OF DISCHARGE:                             HISTORY & PHYSICAL   REASON FOR ADMISSION:  Hematemesis.  ADVANCE DIRECTIVE:  Full code.  PRIMARY CARE PHYSICIAN:  Everardo Pacific in [place], [place].  HISTORY OF PRESENT ILLNESS:  This is a 60 year old male with history of end-stage renal disease, on hemodialysis Monday, Wednesday and Friday, history of alcoholic cirrhosis, status post TIPS procedure over 10 years ago, history of pulmonary embolism off Coumadin 2011, type 2 diabetes, chronic back pain, presented to the emergency room with one episode of hematemesis with cough and fresh blood.  He was not retching prior to this and has no abdominal pain.  He has black stool for the past 2-3 weeks and has been on iron supplement.  He did not take any aspirin or any nonsteroidal anti-inflammatory drugs.  It should be noted that on September 07, 2010, he was admitted to [facility] for GI bleed and at that time, upper and lower endoscopy were done, which showed mild gastritis versus mild GAVE. There were no presence of ulcer and  particularly no esophageal varices.  In the emergency room, hemoglobin was 9.8, which is stable, normal INR, platelet count 120,000.  He remained stable hemodynamically.  His ammonia level is 77.  Abdominal film shows no changes in the right pleural effusion and no other acute finding.  With his end-stage renal disease on hemodialysis, his creatinine is 7.50 and his potassium is 5.0.  Liver function tests were normal.  Hospitalist was asked to admit the patient for upper GI bleed.  Dr. Karilyn Cota was consulted by the emergency room physician and will do consultation tomorrow.  PAST MEDICAL  HISTORY: 1. Pulmonary embolism, last dose Coumadin in December 2011. 2. End-stage renal disease, on dialysis Monday, Wednesday, Friday. 3. Liver cirrhosis, status post transjugular intrahepatic systemic     shunt. 4. History of alcohol abuse, who has not been drinking for over 10     years. 5. Diabetes type 2. 6. Hypertension. 7. Chronic back pain. 8. Anemia of chronic disease.  Baseline hemoglobin 9.0. 9. Emphysema. 10.History of osteomyelitis of L1-L2 in 2003. 11.GERD.  MEDICATIONS:  He is on lactulose, sodium bicarbonate, glipizide 5 mg b.i.d., folic acid, Ultram, Zofran as needed with potassium supplement. This is a partial list.  SOCIAL HISTORY:  He lives in [place], smoke about pack per day. Denies current alcohol or drug abuse.  REVIEW OF SYSTEMS:  Otherwise unremarkable.  PHYSICAL EXAMINATION: Dictation ended at this point.     Houston Siren, MD     PL/MEDQ  D:  09/16/2010  T:  09/16/2010  Job:  [id number]  Electronically Signed by Houston Siren  on 09/30/2010 05:44:57 AM

## 2010-09-30 NOTE — H&P (Signed)
  Timothy Chan, CUMBIE NO.:  [id number]  MEDICAL RECORD NO.:  [id number]           PATIENT TYPE:  I  LOCATION:  IC11                          FACILITY:  [facility]  PHYSICIAN:  Houston Siren, MD           DATE OF BIRTH:  1951-04-11  DATE OF ADMISSION:  09/16/2010 DATE OF DISCHARGE:  LH                             HISTORY & PHYSICAL   ADDENDUM  Dictated H and P was cut off.  This is a re-dictation, continuation of H and P on September 16, 2010.  PHYSICAL EXAMINATION:  VITAL SIGNS:  Blood pressure 120/50, pulse of 80, respiratory rate of 20, temp 97.6. GENERAL:  He is alert and oriented and is in no apparent distress. HEENT:  Sclerae nonicteric.  Speech is fluent.  Tongue is midline. Uvula elevated with phonation. NECK:  Supple.  No lymphadenopathy.  No stridor. CARDIAC:  Revealed S1 and S2, regular.  I did not hear any murmur, rub, or gallop. LUNGS:  Clear.  No wheezes, rales, or any evidence of consolidation. ABDOMEN:  Slightly obese, nondistended, nontender.  Bowel sounds present.  No palpable mass.  No rebound.  Minimal if any evidence of ascites. EXTREMITIES:  Showed 1-2+ edema.  Good distal perfusion with no central or peripheral cyanosis. SKIN:  Warm and dry. MUSCULOSKELETAL:  He has no asterixis. NEUROLOGIC:  Unremarkable. PSYCHIATRIC:  Unremarkable as well.  LABORATORY STUDY:  White count 4900, hemoglobin of 9.8, platelet count of 121,000.  INR of 1.14.  Serum sodium 139, potassium 5.0, glucose of 139, creatinine is 7.50, BUN 54, ammonia level 77, calcium of 8.8, albumin 2.3.  IMPRESSION:  This is a 60 year old male with history of end-stage renal disease on hemodialysis, alcoholic cirrhosis, status post transjugular intrahepatic portosystemic shunt, and diabetes, who presented with upper gastrointestinal bleed.  He has stable hemoglobin and good hemodynamic stability, and I suspect that the amount of upper gastrointestinal bleed is very small.  We will  follow H and H q.6 hours and will transfuse if his hemoglobin is less than 8.  He does have elevated ammonia level and I will give him lactulose q.i.d. if he can tolerate it.  We will hold his iron supplements in case he needs to be rescoped.  We will consult Renal, so he can continue his dialysis as he needs one today. Fortunately, he does not have any esophageal varices and hopefully he will not rebleed.  He is a full code, will be admitted to [facility] Team 1.  He is stable.     Houston Siren, MD     PL/MEDQ  D:  09/16/2010  T:  09/17/2010  Job:  [id number]  Electronically Signed by Houston Siren  on 09/30/2010 05:46:05 AM

## 2010-10-02 ENCOUNTER — Inpatient Hospital Stay (HOSPITAL_COMMUNITY)
Admission: EM | Admit: 2010-10-02 | Discharge: 2010-10-03 | DRG: 811 | Discharge status: Against Medical Advice | Payer: Medicare Other | Attending: Internal Medicine | Admitting: Internal Medicine

## 2010-10-02 ENCOUNTER — Encounter: Payer: Self-pay | Admitting: Internal Medicine

## 2010-10-02 DIAGNOSIS — D649 Anemia, unspecified: Principal | ICD-10-CM | POA: Diagnosis present

## 2010-10-02 DIAGNOSIS — D696 Thrombocytopenia, unspecified: Secondary | ICD-10-CM | POA: Diagnosis present

## 2010-10-02 DIAGNOSIS — E876 Hypokalemia: Secondary | ICD-10-CM | POA: Diagnosis present

## 2010-10-02 DIAGNOSIS — K7030 Alcoholic cirrhosis of liver without ascites: Secondary | ICD-10-CM | POA: Diagnosis present

## 2010-10-02 DIAGNOSIS — I120 Hypertensive chronic kidney disease with stage 5 chronic kidney disease or end stage renal disease: Secondary | ICD-10-CM | POA: Diagnosis present

## 2010-10-02 DIAGNOSIS — E119 Type 2 diabetes mellitus without complications: Secondary | ICD-10-CM | POA: Diagnosis present

## 2010-10-02 DIAGNOSIS — K921 Melena: Secondary | ICD-10-CM | POA: Diagnosis present

## 2010-10-02 DIAGNOSIS — N186 End stage renal disease: Secondary | ICD-10-CM | POA: Diagnosis present

## 2010-10-02 DIAGNOSIS — Z992 Dependence on renal dialysis: Secondary | ICD-10-CM

## 2010-10-02 LAB — COMPREHENSIVE METABOLIC PANEL
AST: 29 U/L (ref 0–37)
CO2: 26 mEq/L (ref 19–32)
Calcium: 8.5 mg/dL (ref 8.4–10.5)
Creatinine, Ser: 4.62 mg/dL — ABNORMAL HIGH (ref 0.4–1.5)
GFR calc Af Amer: 16 mL/min — ABNORMAL LOW (ref >60)
GFR calc non Af Amer: 13 mL/min — ABNORMAL LOW (ref >60)

## 2010-10-02 LAB — DIFFERENTIAL
Basophils Relative: 1 % (ref 0–1)
Eosinophils Absolute: 0.2 10*3/uL (ref 0.0–0.7)
Monocytes Absolute: 0.7 10*3/uL (ref 0.1–1.0)
Monocytes Relative: 12 % (ref 3–12)

## 2010-10-02 LAB — CBC
Hemoglobin: 7.8 g/dL — ABNORMAL LOW (ref 13.0–17.0)
MCH: 30.1 pg (ref 26.0–34.0)
MCHC: 31.7 g/dL (ref 30.0–36.0)
Platelets: 145 10*3/uL — ABNORMAL LOW (ref 150–400)

## 2010-10-02 LAB — GLUCOSE, CAPILLARY: Glucose-Capillary: 331 mg/dL — ABNORMAL HIGH (ref 70–99)

## 2010-10-02 LAB — APTT: aPTT: 32 seconds (ref 24–37)

## 2010-10-02 LAB — PROTIME-INR
INR: 1.14 (ref 0.00–1.49)
Prothrombin Time: 14.6 seconds (ref 11.6–15.2)

## 2010-10-02 NOTE — Telephone Encounter (Signed)
rx called to [facility] in [place]

## 2010-10-02 NOTE — Telephone Encounter (Signed)
Patient called evening of 3/22; out of Dexilant 60 mg daily - just ran out - can't afford it; wants prilosec for reflux.  We will call in an Rx to [facility]

## 2010-10-03 LAB — CBC
MCHC: 32.4 g/dL (ref 30.0–36.0)
Platelets: 135 10*3/uL — ABNORMAL LOW (ref 150–400)
RDW: 17.5 % — ABNORMAL HIGH (ref 11.5–15.5)
WBC: 4.9 10*3/uL (ref 4.0–10.5)

## 2010-10-03 LAB — GLUCOSE, CAPILLARY: Glucose-Capillary: 151 mg/dL — ABNORMAL HIGH (ref 70–99)

## 2010-10-03 LAB — RENAL FUNCTION PANEL
Albumin: 2 g/dL — ABNORMAL LOW (ref 3.5–5.2)
Calcium: 8.3 mg/dL — ABNORMAL LOW (ref 8.4–10.5)
Creatinine, Ser: 5.38 mg/dL — ABNORMAL HIGH (ref 0.4–1.5)
GFR calc Af Amer: 13 mL/min — ABNORMAL LOW (ref >60)
GFR calc non Af Amer: 11 mL/min — ABNORMAL LOW (ref >60)
Phosphorus: 4.1 mg/dL (ref 2.3–4.6)

## 2010-10-03 LAB — DIFFERENTIAL
Basophils Absolute: 0.1 10*3/uL (ref 0.0–0.1)
Eosinophils Absolute: 0.3 10*3/uL (ref 0.0–0.7)
Eosinophils Relative: 5 % (ref 0–5)
Monocytes Absolute: 0.8 10*3/uL (ref 0.1–1.0)

## 2010-10-03 LAB — CROSSMATCH: ABO/RH(D): B NEG

## 2010-10-04 NOTE — H&P (Signed)
NAME:  Timothy Chan, Timothy Chan NO.:  [id number]  MEDICAL RECORD NO.:  [id number]           PATIENT TYPE:  I  LOCATION:  A334                          FACILITY:  [facility]  PHYSICIAN:  Elliot Cousin, M.D.    DATE OF BIRTH:  February 02, 1951  DATE OF ADMISSION:  10/02/2010 DATE OF DISCHARGE:  LH                             HISTORY & PHYSICAL   CHIEF COMPLAINT:  The patient was sent to the emergency department for anemia.  He also reports black stools.  HISTORY OF PRESENT ILLNESS:  The patient is a 60 year old man with a past medical history significant for alcoholic cirrhosis, type 2 diabetes mellitus, end-stage renal disease, and recent evaluation for gastrointestinal bleeding.  He was hospitalized in February 2012 for a heme-positive stool and anemia.  At that time, an upper endoscopy was performed presumably by Dr. Dorena Cookey.  Per the records reviewed, there was only mild gastritis versus mild gastric antral vascular ectasia.  A colonoscopy was performed in February as well and per Dr. Florina Ou report, the patient had a normal colonoscopy on September 08, 2010.  He returned to the hospital on September 16, 2010, with coughing of bloody sputum.  He was evaluated by Dr. Karilyn Cota.  It was believed at that time that the patient may have had a Mallory-Weiss tear versus swallowed blood from epistaxis.  When he was discharged on September 18, 2010, his hemoglobin was 8.1.  The patient was at hemodialysis today in no acute distress.  Apparently, lab work was ordered.  Following the results, the patient was advised to come to the hospital for further evaluation of anemia.  In the emergency department, his hemoglobin is 7.8. Per my discussion with him, he has had no abdominal pain, coughing up of blood, coffee-ground emesis, or any blood in his emesis.  Two to three days ago, he did have one episode of nausea and vomiting, but it was only undigested food that he had eaten 2-3 hours prior.  He  does acknowledge black stools; however, he also agrees that he has had black stools for the past 2 months.  PAST MEDICAL HISTORY: 1. GI bleeding in February 2012, possibly secondary to mild gastritis     versus mild antral vascular ectasia per EGD.  The patient's     colonoscopy was normal. 2. Chronic anemia, secondary to possible chronic GI bleeding and end-     stage renal disease.  His baseline hemoglobin is approximately 8.5     to 9.0 grams. 3. Hematemesis versus hemoptysis, on September 16, 2010, possibly secondary     to Mallory-Weiss tear versus swallowed blood from epistaxis. 4. History of alcohol-induced cirrhosis. 5. Status post TIPS placement and removal in the past. 6. Portal hypertension secondary to cirrhosis. 7. End-stage renal disease, on maintenance hemodialysis Mondays,     Wednesdays, and Fridays. 8. Type 2 diabetes mellitus. 9. History of pulmonary emboli. 10.COPD. 11.Chronic low back pain. 12.Hypertension. 13.Osteomyelitis of L1-L2 vertebrae in June 2003. 14.Gastroesophageal reflux disease. 15.History of hepatic encephalopathy.  MEDICATIONS: 1. Folic acid 1 mg daily. 2. Fosrenol 1000 mg half a tablet with  meals and snacks. 3. Lactulose 60 mL four times daily. 4. Nexium 40 mg daily. 5. Potassium chloride 20 mEq 3 tablets 3 times a week after dialysis. 6. Sodium bicarbonate 650 mg b.i.d. 7. Ultram 50 mg q.4 h p.r.n. pain. 8. Rifaximin 550 mg twice daily (the patient states that he has not     been taking this medication because he cannot financially afford     it). 9. Zinc gluconate daily over-the-counter tablet. 10.Zofran 4 mg every 4 hours as needed for nausea.  ALLERGIES:  THE PATIENT HAS ALLERGY TO MORPHINE, WHICH CAUSES ITCHING.  SOCIAL HISTORY:  The patient is married.  His wife is in [place]. According to him, the authorities will not allow her to come back into the [place].  He has three children.  [place]  He  is disabled.  He is a former Corporate investment banker. He denies alcohol use, although he did have a history of heavy alcohol use.  He denies illicit drug use.  He says that he smokes, but does not inhale less than half a pack of cigarettes daily.  FAMILY HISTORY:  His mother is 55 years of age and has diabetes mellitus and degenerative joint disease.  His father is 28 years of age and has coronary artery disease, status post CABG, and hypertension.  PHYSICAL EXAMINATION:  VITAL SIGNS:  Temperature 97.7, blood pressure 138/66, pulse 91, respiratory rate 20, oxygen saturation 100% on room air.  GENERAL:  The patient is a pleasant, alert, 60 year old Caucasian man, who is currently sitting up in bed, in no acute distress. HEENT:  Head is normocephalic, nontraumatic.  Pupils equal, round, and reactive to light.  Extraocular movements are intact.  Conjunctivae are clear.  Sclerae are white.  Tympanic membranes not examined.  Nasal mucosa is dry.  No sinus tenderness.  Oropharynx reveals a full set of dentures.  Mucous membranes are moist.  No posterior exudates or erythema. NECK:  Supple.  No adenopathy.  No thyromegaly.  No bruit.  No JVD. LUNGS:  Decreased breath sounds in the bases, otherwise clear. HEART:  S1 and S2 with no murmurs, rubs, or gallops. ABDOMEN:  Moderately obese, positive bowel sounds, soft, nontender, and nondistended.  No hepatosplenomegaly.  No masses palpated. GU/RECTAL:  Deferred. EXTREMITIES:  Trace of pedal edema bilaterally.  Pedal pulses barely palpable.  No acute hot red joints.  There is a palpable thrill of his left arm. NEUROLOGIC:  The patient is alert and oriented x3.  Cranial nerves II through XII are intact.  Strength is 5/5 throughout.  Sensation is intact.  ADMISSION LABORATORIES:  PTT 32, PT 14.6, INR 1.14.  Sodium 136, potassium 3.3, chloride 99, CO2 of 26, glucose 198, BUN 18, creatinine 4.62, total bilirubin 0.6, alkaline phosphatase 103, SGOT 29,  SGPT 18, total protein 6.2, albumin 2.2, calcium 8.5.  WBC 5.9, hemoglobin 7.8, hematocrit 24.6, platelet count 145, MCV 95.  ASSESSMENT: 1. Acute on chronic anemia with concomitant melena, which has been     subacute to chronic over the past several months.  The patient has     neither had hematemesis nor bright red blood per rectum.  He has     had black stools for several months, which is not new.  He is     asymptomatic.  His hemoglobin was 8.1 two weeks ago and it is 7.8     today.  The small drop in his hemoglobin could be attributed  to lab variation.  He is treated chronically with a proton pump     inhibitor.  He is hemodynamically stable.  However, given his     medical history, he will be admitted for transfusion and     monitoring for 24-48 hours.  Of note, the patient wants to go     home, but did agree to staying overnight for the transfusion. 2. End-stage renal disease. 3. Type 2 diabetes mellitus.  Apparently, glipizide was discontinued     during the previous hospitalization due to low normal blood sugars.     His venous glucose is currently 198. 4. Chronic hypokalemia.  The patient's serum potassium is 3.3 today. 5. Chronic thrombocytopenia.  His thrombocytopenia is likely from     cirrhosis.  During the review of his past medical history     and previous anemia panel, there appears to be no evidence of     vitamin B12 or folate deficiency.  His platelet count in February     was 109 and today it is 145. 6. Cirrhosis.  He does have a history of hepatic encephalopathy, but     no evidence currently.  He is alert and oriented and cooperative.  PLAN: 1. The patient will be admitted for 24 to 48-hour monitoring.  We will     consult the gastroenterologist only if needed. 2. We will transfuse him 1 unit of packed red blood cells over 4 hours     at a minimum. 3. We will consult Nephrology as needed. 4. We will start intravenous proton pump inhibitor therapy for  24-48     hours before transitioning him to the oral preparation. 5. Supportive treatment. 6. We will start with a full liquid diet and advance as tolerated     particularly if he continues to be asymptomatic. 7. We will check his hemoglobin after transfusion tonight and then     again in the morning. 8. We will start sliding scale NovoLog before each meal and at bedtime     and monitor his capillary blood glucose closely.     Elliot Cousin, M.D.     DF/MEDQ  D:  10/02/2010  T:  10/02/2010  Job:  [id number]  cc:   Lionel December, M.D. Fax: [phone]  Doreen Beam, MD Fax: [phone]  Dr. Everardo Pacific  Electronically Signed by Elliot Cousin M.D. on 10/04/2010 02:15:47 PM

## 2010-10-04 NOTE — Discharge Summary (Signed)
NAMESHERON, Timothy Chan NO.:  [id number]  MEDICAL RECORD NO.:  [id number]           PATIENT TYPE:  I  LOCATION:  A334                          FACILITY:  [facility]  PHYSICIAN:  Elliot Cousin, M.D.    DATE OF BIRTH:  [birth year]  DATE OF ADMISSION:  10/02/2010 DATE OF DISCHARGE:  03/24/2012LH                              DISCHARGE SUMMARY   DISCHARGE DIAGNOSES: 1. The patient left against medical advice. 2. Acute on chronic anemia with concomitant melena. 3. The patient's hemoglobin on admission was 7.8 and following 1 unit     of packed red blood cell transfusion, it was 7.7 at the     time he left. 4. Hepatic cirrhosis. 5. Chronic thrombocytopenia. 6. End stage renal disease.  DISCHARGE MEDICATIONS:  None as the patient left against medical advice.  DISCHARGE DISPOSITION:  The patient left against medical advice.  He did mention that he will follow up with his primary care physician, Dr. Sherril Croon and possibly go to the hospital in [place], [place] for further treatment.  CONSULTATIONS:  Nephrology consultation was pending.  PROCEDURE PERFORMED:  None.  HISTORY OF PRESENT ILLNESS:  The patient is a 60 year old man with a past medical history significant for acute on chronic anemia, recent diagnosis of mild gastritis versus mild antral vascular ectasia in February 2012, cirrhosis, and end-stage renal disease.  He presented to the emergency department yesterday after the dialysis staff found that his hemoglobin was low.  When he presented to the emergency department, his hemoglobin was 7.8.  When he was discharged on September 18, 2010, his hemoglobin was 8.1.  The patient also reported black tarry stools, which he has had on and off for several months.  The patient was admitted for further evaluation and management.  HOSPITAL COURSE:  The patient was restarted on proton pump inhibitor therapy with Protonix.  It was given intravenously, but the plan was  to transition it to the oral preparation after 48 hours.  One 1 unit of packed red blood cells was typed and crossed.  He was transfused over 4 hours.  I decided to transfuse him only 1 unit overnight because he has end-stage renal disease.  CBC was ordered 1 hour after transfusion, however, apparently it was not done.  Following the 1 unit transfusion, the following morning, his hemoglobin was 7.7.  The patient denied any bright red blood per rectum.  He denied hematemesis or coughing up of blood.  He denied abdominal pain or cramping.  He did not have a bowel movement overnight or this morning.  When I informed him that his hemoglobin did not increase, he became upset because he received only 1 unit of blood instead of two overnight.  I explained that I did not want to give him 2 units of blood in the setting of end-stage renal disease because his next scheduled hemodialysis was not until March 25.  I discussed the patient with Dr. Kristian Covey.  He did agree with giving him another unit of packed red blood cells. He went on to say, that if the patient needed emergent dialysis, he  would be aware of it and be prepared if it was needed.  I informed the patient of this, however, he was upset and wanted to leave.  He did not want to wait for another transfusion.  He stated that he will go to the hospital in [place], [place].  The patient decided to leave against medical advice, although I did explain to him that there was a potential source of bleeding that we had not found yet.  He understood, but was adamant about leaving against medical advice.  PHYSICAL EXAMINATION:  VITAL SIGNS:  Temperature 98.3, pulse 87, respiratory rate 16, blood pressure 126/67, oxygen saturation 98% on room air.  CBG 151. LUNGS:  Decreased breath sounds in the bases.  Clear anteriorly. HEART:  S1 and S2 with a soft systolic murmur. ABDOMEN:  Obese, positive bowel sounds, soft, nontender,  nondistended. No hepatosplenomegaly.  No masses palpated. EXTREMITIES:  Trace of pedal edema bilaterally.  Pedal pulses barely palpable. NEUROLOGIC:  The patient is alert and oriented x3.  Cranial nerves II through XII are intact.  Sensation is decreased over the lower extremities.  The patient is able to raise both legs against gravity off the bed.  CURRENT AND DISCHARGE LABORATORY DATA:  WBC 4.9, hemoglobin 7.7, MCV 93.3, platelet count 135.  Sodium 137, potassium 3.7, chloride 104, CO2 of 27, glucose 168, BUN 23, creatinine 5.38, albumin 2.0, calcium 8.3, phosphorus 4.1.     Elliot Cousin, M.D.     DF/MEDQ  D:  10/03/2010  T:  10/03/2010  Job:  [id number]  cc:   Lionel December, M.D. Fax: [phone]  Doreen Beam, MD Fax: [phone]  Electronically Signed by Elliot Cousin M.D. on 10/04/2010 02:29:09 PM

## 2010-10-07 NOTE — Telephone Encounter (Signed)
.  rmr 

## 2010-10-13 NOTE — Consult Note (Signed)
NAMEREISE, Timothy Chan NO.:  [id number]  MEDICAL RECORD NO.:  [id number]           PATIENT TYPE:  LOCATION:                                 FACILITY:  PHYSICIAN:  Lionel December, M.D.    DATE OF BIRTH:  Oct 05, 1950  DATE OF CONSULTATION: DATE OF DISCHARGE:                                CONSULTATION   CHIEF COMPLAINT:  Coughing up blood.  HISTORY OF PRESENT ILLNESS:  This is a 60 year old male with a history of end-stage renal disease on hemodialysis, alcoholic cirrhosis, and diabetes who presented to the hospital with history of coughing up blood one time.  The patient was admitted to the hospital at this time and he has had a stable hemoglobin and H and H q.6 h. has remained stable throughout the course of his hospitalization.  The patient states that he felt a little nauseous on the day of the hospital admission.  He states he coughed up blood 1 time less than a cup full and decided to come to the hospital at that time.  The patient does state he has had black stools for the past 2 weeks; however, the patient has been iron supplement at this time.  Further history reveals that the patient did have a mild nose bleed 2 days prior to the episode of hematemesis at which he states was mild and did not recur again.  The patient was hospitalized in February at [facility] for GI bleed and he did have an upper and lower endoscopy.  At that time, EGD showed mild gastritis versus mild portal gastropathy with no esophageal varices.  The patient had a normal colonoscopy during that hospital stay as well.  PAST MEDICAL HISTORY: 1. End-stage renal disease on dialysis, Monday, Wednesday, and Friday     schedule. 2. Liver cirrhosis status post transjugular intrahepatic systemic     shunt. 3. Diabetes type 2. 4. Hypertension. 5. Anemia of chronic disease with baseline hemoglobin 9.0. 6. GERD. 7. Emphysema.  MEDICATIONS:  The patient is on lactulose, glipizide 5 mg  b.i.d., folic acid, Ultram, Zofran.  SOCIAL HISTORY:  The patient is a former Corporate investment banker, smokes a pack per day, denies current alcohol or drug use.  REVIEW OF SYSTEMS:  Significant for nose bleed 2 days prior.  The patient denies any abdominal pain, change in bowel habits other than black stool for 2 weeks.  PHYSICAL EXAMINATION:  GENERAL:  Alert and oriented x3, no acute distress. VITAL SIGNS:  Temperature 98.1, pulse 70, respirations 14, blood pressure 147/71. HEENT:  Normocephalic and atraumatic.  Pupils equal, round, and reactive to light.  Extraocular muscles are intact.  Throat is clear and dry. NECK:  Supple.  No masses.  No thyromegaly. CARDIOVASCULAR:  Regular rate and rhythm with no murmur appreciated. LUNGS:  Clear to auscultation bilaterally.  No wheezes, rales, or rhonchi. ABDOMEN:  Bowel sounds present.  Mild distention, soft.  No mass present.  No hepatosplenomegaly. EXTREMITIES:  Pulses 2/4 bilaterally.  No pretibial edema.  LABORATORY DATA:  Hemoglobin times last four 9.8, 8.7, 8.4, 8.7; hematocrit most recent 26.5.  Ammonia 77.  RADIOLOGY:  PA, lateral on chest impression, no change in effusion and basilar airspace opacities with chronic abnormality.  IMPRESSION:  This is a 60 year old male with known alcoholic liver disease complicated by ascites and HE who presents with single episode of  hematemesis. He is hemodynamically stable. he does give history of nose bleeds. He therefore could have vomited swallowed blood or he could have Mallory Weiss  tear. He just underwent EGD and TCS at [facility] last month. I do not see any  indication to repeat these studies unless he has active GI bleed.  RECOMMENDATIONS: 1. Serial H/H as you are doing. 2. Will start him back on Xifaxan for chronic HE. We appreciat the opportunity to participate in the care of this gentleman.    Lionel December, M.D.     NR/MEDQ  D:  09/17/2010  T:  09/18/2010  Job:   [id number]  Electronically Signed by Lionel December M.D. on 10/13/2010 09:30:06 AM

## 2010-10-22 NOTE — Discharge Summary (Signed)
NAMEEWART, CARRERA                  ACCOUNT NO.:  [id number]  MEDICAL RECORD NO.:  [id number]           PATIENT TYPE:  I  LOCATION:  IC12                          FACILITY:  [facility]  PHYSICIAN:  Naziah Portee L. Lendell Caprice, MDDATE OF BIRTH:  [birth year]  DATE OF ADMISSION:  09/16/2010 DATE OF DISCHARGE:  03/09/2012LH                              DISCHARGE SUMMARY   DISCHARGE DIAGNOSES: 1. Hematemesis, possibly Mallory-Weiss tear versus swallowed blood     from epistaxis. 2. Acute on chronic anemia. 3. History of alcoholic cirrhosis, currently sober. 4. History of TIPS procedure. 5. Diabetes. 6. Endstage renal disease, on dialysis. 7. Hepatic encephalopathy. 8. History of pulmonary embolus. 9. Chronic back pain. 10.Gastroesophageal reflux disease.  DISCHARGE MEDICATIONS: 1. Folic acid 1 mg daily. 2. Fosrenol 1000 mg one-half tablet with meals and snacks. 3. Lactulose 60 mL p.o. q.i.d. 4. Nexium 40 mg daily. 5. KCl 20 mEq 3 tablets three times a week after dialysis. 6. Sodium bicarbonate 650 mg twice a day. 7. Ultram 50 mg p.o. q.4 h. p.r.n. pain. 8. Xifaxan 550 mg p.o. b.i.d. 9. Zinc gluconate daily (over-the-counter). 10.Zofran 4 mg p.o. q.4 h. p.r.n. nausea. 11.Stop glipizide.  CONDITION:  Stable.  ACTIVITY:  He uses a wheelchair.  FOLLOWUP:  With his primary care physician next week to monitor hemoglobin and diabetes.  Follow up with Dr. Karilyn Cota if bleeding returns.  CONDITION:  Stable.  CONSULTATIONS:  Dr. Kristian Covey and Dr. Karilyn Cota.  PROCEDURES:  Hemodialysis.  Diet should be diabetic.  LABORATORY DATA:  On admission, CBC significant for a hemoglobin of 9.8, hematocrit 29.9, and platelet count 121.  At discharge, his hemoglobin is 8.2, hematocrit 25, and platelet count 93,000.  He did not require transfusion on admission.  Complete metabolic panel significant for a glucose of 139, BUN 54, and creatinine 7.5.  At discharge, his glucose is 90, BUN 26, creatinine 5.9,  magnesium 2.3, phosphorus 6.8, and ammonia 77.  PT and PTT normal.  DIAGNOSTICS:  Acute abdominal series showed no change in right effusion and basilar airspace opacity, most consistent with chronic change.  HISTORY AND HOSPITAL COURSE:  Please see H and P for details.  Mr. Hands is a 60 year old white male with a history of cirrhosis who presented with hematemesis.  He had a recent upper and lower endoscopy last month at [facility].  He also reported black stool, but he is on iron supplementation.  His endoscopy showed gastritis last month. The patient also reported some epistaxis and may have swallowed some blood.  The patient was admitted to step-down.  His hemoglobin dropped slightly, but remained stable for several days.  GI was consulted and felt that since he had no further obvious bleeding and his hemoglobin was stable, he would not require repeat endoscopy.  The patient did have hemodialysis while here.  He has had no further epistaxis, and his hemodynamic monitoring has been stable.  He is requesting discharge.  I will advance his diet as tolerated.  He can be followed up as an outpatient.  His blood glucoses remained 80 to about 110 while off his glipizide,  and I have asked him to hold it unless his blood sugars become more elevated.  His other medical problems remained stable during the hospitalization. Total time on the day of discharge is greater than 30 minutes.     Courage Biglow L. Lendell Caprice, MD     CLS/MEDQ  D:  09/18/2010  T:  09/19/2010  Job:  [id number]  Electronically Signed by Crista Curb MD on 10/22/2010 11:01:49 AM

## 2010-11-17 ENCOUNTER — Ambulatory Visit (INDEPENDENT_AMBULATORY_CARE_PROVIDER_SITE_OTHER): Payer: Medicare Other | Admitting: Internal Medicine

## 2010-11-17 DIAGNOSIS — K625 Hemorrhage of anus and rectum: Secondary | ICD-10-CM

## 2010-11-24 NOTE — Discharge Summary (Signed)
Timothy Chan, Timothy Chan NO.:  [id number]   MEDICAL RECORD NO.:  [id number]          PATIENT TYPE:  INP   LOCATION:  A212                          FACILITY:  [facility]   PHYSICIAN:  Timothy Shipper, MD     DATE OF BIRTH:  09-Feb-1951   DATE OF ADMISSION:  03/23/2007  DATE OF DISCHARGE:  09/13/2008LH                               DISCHARGE SUMMARY   The patient used to see Dr. Kathlene Chan in [place] but no longer has a PMD.   He is also followed by [facility] for liver cirrhosis.   DISCHARGE DIAGNOSES:  1. Pulmonary edema, improved, of unclear etiology.  2. History of hypertension, stable.  3. History of type 2 diabetes, stable.  4. History of alcoholic liver cirrhosis, stable.  5. Renal failure, likely chronic.   Please see the H&P dictated by Dr. Benson Chan for details regarding  patient's presenting illness.   BRIEF HOSPITAL COURSE:  1. Shortness of breath.  This is a 60 year old Caucasian male who      presented with a one-week history of shortness of breath.  He was      evaluated in the emergency department.  He was also giving history      of orthopnea and PND.  The patient underwent chest x-ray which      showed bilateral effusions with associated atelectasis, right worse      than left, and there was evidence for mild interstitial edema.  The      patients BNP unfortunately was checked, not at that day, but the      next day, and it was 111.  The patient was given IV Lasix in the ED      and subsequently admitted with b.i.d. IV Lasix.  The patient      imroved significantly yesterday.  His symptoms had previously      resolved as of yesterday.  Today, he is feeling much, much more      better.  He is very keen on going home.  Echocardiogram was      ordered; however, it was not done for unclear reasons.  Now, this      is the weekend, and the earliest it would be done is Monday.  Since      the patient wants to go home, I  think I will go ahead and discharge      him.  His cardiac enzymes have been negative, so there is really no      urgent need to do this echocardiogram.  BNP today is 52.  Etiology      for the pulmonary edema again is not very clear.  We will discharge      him on once a day dose of Lasix, and we will go ahead and      discontinue his Ace inhibitor.  2. Renal insufficiency.  Creatinine when he presented was 2.2 with a      BUN of 24.  It has gone up a little bit to 29 and 2.7 today.  He is      making urine.  He says he has a history of kidney disease.  Again,      we would like to see improvement in the renal function.  However,      since the patient is feeling so much better and is stable, I will      let him go and have labs done in a few days time.  He tells me that      he has an appointment at [facility] on September 24, and      if he is feeling well, I think it will be reasonable for him to      have the blood work done there.  3. History of diabetes, stable.  He can continue his home medications.  4. 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  Function is stable.  He had      an ultrasound which showed that he has had a TIPS placed in the      past, and that appears to be patent.  No gallstones and fatty      infiltration of the liver was noted.  He also has anemia which      appears to be secondary to chronic disease, and H&H is also stable.   Once again, we are left with a not-so-ideal situation here.  Today is a  weekend.  The patient does not have a PMD on the outside, but he is  symptomatically much better.  He is really keen on going home.  I think  it is reasonable to discharge him, with close followup.  We will give  him the phone number for one of the unassigned physicians who was on, on  September 11th, and he can call their office on Monday and get an  appointment set up pretty quickly.  I will write him prescriptions for  the echocardiogram and a basic  metabolic profile.  I have told him that,  if he at any time has any difficulty passing urine, experiences nausea  or vomiting, worsening shortness of breath, then he is to seek attention  immediately.   DISCHARGE MEDICATIONS:  1. Lasix 40 mg p.o. daily.  2. Glipizide 10 mg b.i.d.  3. He is to discontinue his lisinopril.   OTHER TESTS:  BMET in a week's time, 2D echocardiogram as an outpatient.   FOLLOWUP:  With the unassigned physician for September 11th (Dr.  Erby Chan).  The patient to call for appointment on Monday, September  15th and follow up with [facility] as scheduled on September  24th.   DIET:  Renal, modified carbohydrate.   PHYSICAL ACTIVITY:  No restrictions.   CONSULTATIONS:  No consultations obtained during this admission.   Total time of discharge 35 minutes.      Timothy Shipper, MD  Electronically Signed     GK/MEDQ  D:  03/25/2007  T:  03/25/2007  Job:  [id number]   cc:   Timothy Chan, M.D.

## 2010-11-24 NOTE — H&P (Signed)
NAMEREMINGTON, HIGHBAUGH NO.:  [id number]   MEDICAL RECORD NO.:  [id number]          PATIENT TYPE:  EMS   LOCATION:  ED                            FACILITY:  [facility]   PHYSICIAN:  Marcello Moores, MD   DATE OF BIRTH:  09-28-1950   DATE OF ADMISSION:  03/23/2007  DATE OF DISCHARGE:  LH                              HISTORY & PHYSICAL   PMD:  Unassigned.   CHIEF COMPLAINT:  Shortness of breath of 1 week's duration.   HISTORY OF PRESENT ILLNESS:  Timothy Chan is 60 year old man with a  history of liver cirrhosis, alcohol-related, diabetes mellitus,  hypertension, who came with shortness of breath of 1 week's duration.  According to the patient, for the last 1 week he had this shortness of  breath, which was worsened every day.  He admitted that he has orthopnea  and paroxysmal nocturnal dyspnea.  He has associated cough with whitish  sputum but denied any fever, no chest pain, no palpitation, no  dizziness, no vomiting.  He has not any abdominal or urinary complaints  as well.  The patient stated that he had cirrhosis for the last several  years and he had his creatinine related to his cirrhosis and he believes  that his chronic liver problem is related to his alcohol abuse.  The  patient lives in [place] and he following with Dr. Doyne Keel at [facility]  before but now he has not any primary doctor.   REVIEW OF SYSTEMS:  A 10-point review of systems is as detailed above.  She has not any headache, no neurological or any musculoskeletal  complaints as well.   ALLERGIES:  He has no known drug allergies.   SOCIAL HISTORY:  He lives in [place] and is a current smoker and  still admitted he drinks and denied any drug abuse.   FAMILY HISTORY:  Noncontributory.   PAST MEDICAL HISTORY:  1. Cirrhosis of liver secondary to alcohol abuse.  2. Diabetes mellitus.  Hypertension.   HOME MEDICATIONS:  1. Glipizide 10 mg twice a day.  2. Lisinopril 10 mg once a day.   PHYSICAL  EXAMINATION:  The patient is lying in the bed without any  current respiratory distress, lying comfortably.  VITAL SIGNS:  Blood pressure is 169/74, temperature 98.8 and pulse is  92, respiratory rate is 20 per minute and saturation is 96% on room air.  HEENT:  He has pink conjunctivae.  Nonicteric sclerae.  NECK:  Supple.  CHEST:  He has air entry bilaterally with decreased air entry to  posterior basal area bilaterally, more on the right side.  Otherwise, he  has no rhonchi.  No wheezes.  CVS:  S1-S2 well-heard.  No murmur or gallop appreciated.  ABDOMEN:  Distended, obese, questionable flank dullness.  No area of  tenderness.  Normoactive bowel sounds.  EXTREMITIES:  He has not any pitting pedal or pretibial edema.  Peripheral pulses positive.  CNS:  He is alert and well-oriented.   LABS:  CBC:  White blood cell count is 8.9, hemoglobin is 10.9,  hematocrit 32  and platelet count is 233.  PT/INR is 14/1.1.  CK-MB is  2.3 and troponin is less than 0.05.  On the chemistry, sodium is 142,  potassium is 4.5, chloride is 114 and bicarb 24, glucose 88, BUN 24,  creatinine is 2.2, calcium is 9.  Total protein is 6.6 and albumin is  2.2, AST is 29.  Chest x-ray was done in the emergency room, which  showed bilateral pleural effusion, more on the right, and mild pulmonary  edema as well.   ASSESSMENT:  1. Bilateral pleural effusion with mild pulmonary edema associated      with shortness of breath.  This could be related to his liver      failure but congestive heart failure should be ruled out as well.      Will put him on Lasix and Aldactone and will do tomorrow      echocardiogram.  We will send BNP now and will do tomorrow      ultrasound of the liver and ascites as well as the kidney.  Will      send an ammonia level today and we will do ABG and will send liver      function panel as well.  2. Hypertension, not well-controlled.  Will continue his home      medication, lisinopril, and  the patient will receive also Lasix as      well as Aldactone, and will monitor his vital signs.  3. Liver cirrhosis, from the history alcohol-related.  Will see how      the liver function tests and will see also whether there is ascites      related to this tomorrow by ultrasound of the abdomen.  4. Diabetes mellitus, fairly controlled.  Will put him on sliding      scale sensitive level.   Our further management will depend on our investigation results whether  we will go for congestive heart failure or liver cirrhosis and response  of the patient, and the patient will be put on GI as well as DVT  prophylaxis.      Marcello Moores, MD  Electronically Signed     MT/MEDQ  D:  03/23/2007  T:  03/24/2007  Job:  [id number]

## 2010-11-27 NOTE — Discharge Summary (Signed)
[place]. [facility]  Patient:    Timothy Chan, Timothy Chan Visit Number: [id number] MRN: [id number]          Service Type: MED Location: [phone] Attending Physician:  Madaline Guthrie Dictated by:   Hillery Aldo, M.D. Admit Date:  01/03/2002 Discharge Date: 01/05/2002   CC:         Cristi Loron, M.D.  Rockey Situ. Flavia Shipper., M.D.  [facility], Attn. Dr. Doyne Keel   Discharge Summary  DISCHARGE DIAGNOSES:  1. 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  2. Diskitis.  3. Cirrhosis of the liver, status post transjugular intrahepatic     portosystemic shunt in 2001 secondary to intractable ascites.  4. Diet controlled diabetes.  5. History of gastrointestinal bleed,  6. History of alcohol abuse, abstinence since 1999.  DISCHARGE MEDICATIONS:  1. Vancomycin 2 g IV q.d.  2. Nexium 20 mg p.o. b.i.d.  3. Stool softeners p.r.n. while on pain medications.  4. Percocet 5/325 mg one to two tablets p.o. q.4h p.r.n. back pain.  CONSULTANTS: Dr. Burnice Logan, infectious disease.  HISTORY OF PRESENT ILLNESS: The patient is a 60 year old white male with vertebral osteomyelitis and diskitis dating back for five months.  The patient initially presented with back pain and had multiple evaluations by both multiple hospitals and specialists, and was finally diagnosed back in March 2003.  The patient recently underwent intrathecal contrast CT scanning on December 22, 2001 which showed an L1-L2 narrowing of the interspace and disruption of the anterior margin of the L2 vertebral body with left lateral and anterolateral involvement as well.  This was worse than the CT scan that was obtained on September 21, 2001 at [facility].  The patient was therefore thought to have osteomyelitis and diskitis as well as a paravertebral abscess which was also seen on the CT scan on December 22, 2001.  The patient underwent a CT-guided aspiration of this area on January 01, 2002, which subsequently  grew out Staphylococcus aureus, pan-sensitive.  The patient denied any fever or chills.  He has had back pain, nearly constant and of a throbbing quality, rated 10+/10 at its worse.  The patient states that lying still sometimes partially alleviates the pain.  He also reports bilateral leg numbness for approximately one month.  He has had some gait instability secondary to the numb feeling.  He denies any bowel or bladder incontinence.  He does have decreased appetite, with an approximate 20 pound weight loss in the past five months.  No nausea, vomiting, or diarrhea.  He does have occasional constipation secondary to pain medications.  He does have occasional dizziness as well.  No hematuria but occasional dysuria and hesitancy.  ADMISSION PHYSICAL EXAMINATION:  VITAL SIGNS: Temperature 97.8 degrees, blood pressure 160/80, pulse 85, respirations 20.  O2 saturation 98% on room air.  GENERAL: Well-developed, well-nourished white male in obvious discomfort.  HEENT: Normocephalic, atraumatic.  PERRL.  EOMI.  Oropharynx clear.  NECK: Supple without lymphadenopathy or thyromegaly.  CHEST: Lungs clear to auscultation bilaterally with positive spider angiomata on the anterior chest wall.  HEART: Regular rate and rhythm.  No murmurs, rubs, or gallops.  ABDOMEN: Soft, nontender, nondistended, with positive bowel sounds.  There is questionable hepatosplenomegaly.  EXTREMITIES: No clubbing, edema, or cyanosis.  BACK: The patient is exquisitely tender to mild palpation in the L1-L2 area as well as the left paraspinal area, which is greater than his tenderness on the right.  NEUROLOGIC: Alert and oriented x3.  The patient reports  subjective numbness of bilateral lower extremities, although he can determine sensation to monofilament testing.  There is no asterixis.  The patient does have some mild motor weakness to his bilateral lower extremities but this appears to be secondary to pain  and not an actual neurological impingement.  LABORATORY DATA: Sodium 135, potassium 4.0, chloride 101, bicarbonate 28, glucose 154, BUN 20, creatinine 1.7, calcium 8.9, total protein 8.2, albumin 2.3, AST 23, ALT 14, alkaline phosphatase 213, total bilirubin 0.5.  CBC showed a WBC of 6.0, hemoglobin 9.0, hematocrit 26.4, platelet count 237,000. His erythrocyte sedimentation rate was 125.  His C-reactive protein was 13.1. Urinalysis was negative for nitrites, leukocyte esterase, and ketones.  He did have 30 mg/dl protein and a large amount of hemoglobin.  Hemoglobin A1C was 6.0.  Repeat urinalysis revealed 3-6 rbc/hpf.  The patients PT was 15.5, INR 1.2.  Ferritin was 548, GGT 156, reticulocyte count 1.0%, absolute reticulocyte count 31.2.  Iron was 19, total iron binding capacity 172, per cent saturation 11.  HOSPITAL COURSE: PROBLEM #1 - OSTEOMYELITIS/DISKITIS: The patient was initially treated with empiric Ancef.  The sensitivities returned the following day and showed a pan-sensitive Staphylococcus aureus.  A PICC line was placed secondary to the need for long-term antibiotic therapy for six to eight weeks.  The patients antibiotic regimen was changed to IV vancomycin on January 05, 2002 secondary to need for q.d. dosing as he will have to obtain his IV antibiotics at an outpatient facility.  The pharmacy saw him and dosed his vancomycin.  He is to have a vancomycin trough drawn before the fifth dose is given and once a week after that.  PROBLEM #2 - ANEMIA: The patient has a history of hematuria and did have a large amount of blood in the first urinalysis.  This was inadequate to explain his hemoglobin which dropped from 9.0 to 7.8 on admission.  He has had urological work-up in the past, although it is unclear as to what the source of his urological problem was.  Iron studies revealed a picture of anemia  secondary to chronic disease.  His ferritin was elevated so he was not felt  to benefit from iron supplementation.  His hemoglobin stabilized and on discharge was 8.3.  PROBLEM #3 - LIVER DISEASE: The patient has known cirrhosis, which has been stable.  His elevated GGT was worrisome so an MRI was obtained to rule out seeding of the liver by Staphylococcus aureus.  Once these films were reviewed, the patient was cleared for discharge.  PROBLEM #4 - HYPERGLYCEMIA: The patient maintains his glucose level strictly through dietary control.  He has not required any additional intervention at this point./  PROBLEM #5 - HYPERTENSION: The patient was somewhat hypertensive during this hospital stay but this was felt to be secondary to acute pain.  If it persists he may need an antihypertensive.  PROBLEM #6 - PAIN: The patient had severe back pain and was treated with p.r.n. narcotic analgesics.  He was also fitted for a lumbar corset to help relieve the pressure on the lumbar spine.  PROBLEM #7 - CONSTIPATION: The patient was given stool softeners p.r.n. and will need to take these for as long as he remains on narcotic analgesic medications.  DISPOSITION: The patient is discharged home.  FOLLOW-UP: He is to return to [facility] as an outpatient for daily vancomycin therapy.  He understands he should call the M.D. for any worsening back pain, increased numbness or loss of power in  his legs, bladder or bowel dysfunction, fever or chills, or faintness.  The patient will follow up with Dr. Roxan Hockey at the outpatient clinic in three weeks.  Additionally, Dr. Doyne Keel at [facility] will assume his care on discharge. Dictated by:   Hillery Aldo, M.D. Attending Physician:  Madaline Guthrie DD:  01/07/02 TD:  01/09/02 Job: [id number]

## 2010-11-27 NOTE — Discharge Summary (Signed)
NAME:  Timothy Chan, Timothy Chan                            ACCOUNT NO.:  [id number]   MEDICAL RECORD NO.:  [id number]                   PATIENT TYPE:  INP   LOCATION:  5707                                 FACILITY:  [facility]   PHYSICIAN:  Alfonse Spruce, M.D.               DATE OF BIRTH:  1950-10-29   DATE OF ADMISSION:  06/09/2003  DATE OF DISCHARGE:  06/10/2003                                 DISCHARGE SUMMARY   DISPOSITION:  Discharged against medical advice.   FINAL DIAGNOSIS:  1. Acute pancreatitis.  2. Alcohol abuse with history of liver cirrhosis.  3. Underlying history of hypertension started on beta blocker.  4. History of vascular stent.  5. Renal function impairment.   HISTORY OF PRESENT ILLNESS:  The patient was admitted by Dr. __________ with  abdominal epigastric pain, and he has significant elevation of his lipase  and amylase as well. His amylase was 1,079, lipase was 4,373, and also he  had epigastric pain. He underwent ultrasound which fluid around the pancreas  with underlying pancreatitis with history of drinking beer in the month;  however, the patient is not compliant and reliable and probability of  drinking more than that amount to cause the acute pancreatitis. In the  hospital, the patient was started IV fluids as well as thiamine as well as  monitoring infection, started on Primaxin. The patient subsequently today  called the nurses and told them that he wants to leave, and in spite of  nursing discussion as well as myself - I went and tried to persuade the  patient for continuing the course of treatment for the acute pancreatitis  and discussed his current laboratory and treatment with still the lipase and  amylase was elevated. His blood pressure was high this morning at 204/98;  however, it improved subsequently after he took the Lopressor but still  uncontrolled 162/84, and the patient was planned for the patient planning to  gradually increase his diet to clear  liquid to full liquid to advance to  avoid the nausea and vomiting as well. The patient adamantly and resistant  refused and decided to sign against medical advice.   Laboratory on discharge was sodium 139, potassium 3.8, chloride 102, carbon  dioxide 24, BUN 7, creatinine 1.7, blood sugar 108. His vital signs were  temperature of 98.6, pulse 70, respiratory rate 22, pulse oximetry 97, and  blood pressure 162/84. Discuss with him the results of lab as well with the  serum amylase and lipase slightly improved, however, is not normalized; his  amylase 412 and the lipase 845. His examination was with clinical  improvement, however, and the patient advised to see his physician, Dr.  Wende Crease. The patient stated that he will see him tomorrow. Given him  small prescription for blood pressure to hold until he sees doctor Lopressor  25 mg p.o. b.i.d., 10 tablets, and Protonix 40  mg p.o. q.d. five tablets,  and the patient advised to advance the diet very slow from clear to fluid  liquids to see if he tolerates the intake.                                                Alfonse Spruce, M.D.    Wynn Maudlin  D:  06/11/2003  T:  06/11/2003  Job:  [id number]

## 2010-11-27 NOTE — H&P (Signed)
NAME:  Timothy Chan, Timothy Chan NO.:  [id number]   MEDICAL RECORD NO.:  [id number]                   PATIENT TYPE:  EMS   LOCATION:  MAJO                                 FACILITY:  [facility]   PHYSICIAN:  Renato Battles, M.D.                  DATE OF BIRTH:  [birth year]   DATE OF ADMISSION:  06/09/2003  DATE OF DISCHARGE:                                HISTORY & PHYSICAL   REASON FOR ADMISSION:  Abdominal pain.   HISTORY OF PRESENT ILLNESS:  The patient is a pleasant 60 year old white  male who presented to the emergency room with three-day history of  progressive epigastric pain without radiation, associated with nausea and  vomiting and anorexia.  The pain got much worse today.  The patient was  unable to tolerate it any longer, so was driven down with significant others  to multiple hospitals for admission and treatment.   REVIEW OF SYSTEMS:  CONSTITUTIONAL:  No fevers, chills, or night sweats.  No  weight changes.  GI; Positive for nausea, vomiting, and abdominal pain,  loose bowel movements.  No hematochezia, no hematemesis.  CARDIOPULMONARY:  No chest pain, no cough, no shortness of breath.  GU:  No dysuria,  hematuria, or urinary retention.   PAST MEDICAL HISTORY:  1. Hepatic cirrhosis.  2. Alcoholism.  3. History of pancreatitis.  4. History of spinal abscess.   PAST SURGICAL HISTORY:  1. Liver stent.  2. Vascular stents in lower extremity vessels.   FAMILY HISTORY:  Mother had diabetes.  Father has heart disease.   SOCIAL HISTORY:  He smokes a pack and one-half a day for the last 33 years.  He relapsed back to alcoholism one month ago.  Denies drugs.  He lives in  [place].   HOME MEDICATIONS:  None.   ALLERGIES:  No known drug allergies.   PHYSICAL EXAMINATION:  GENERAL:  Alert and oriented in no acute distress.  VITAL SIGNS:  Temperature 99, heart rate 96, respiratory rate 12, blood  pressure 172/93.  HEENT:  Atraumatic and normocephalic.   Pupils are equal and reactive to  light and accommodation.  The patient has mild jaundice.  He has significant  buckle telangiectasia.  NECK:  No lymphadenopathy, thyromegaly, JVD.  CHEST:  Clear to auscultation bilaterally.  No wheeze, rales, or rhonchi.  Positive bilateral gynecomastia.  HEART:  Regular rate and rhythm.  Cardiac no murmur, gallop, or rub.  ABDOMEN:  Soft, nondistended, and no hepatomegaly.  The patient has mild  tenderness on deep palpation in epigastric area.  There is early abdominal  wall venous dilation.  EXTREMITIES:  No cyanosis, edema, clubbing.   LABORATORY STUDIES:  CBC showed white count of 10.6 with 86% neutrophils.  Hemoglobin was 126 with MCV 95.  Platelets were low at 89.  Amylase was  elevated at 1079.  Lipase was elevated at 1079.  Basic  metabolic panel  showed elevated creatinine at 1.8, elevated glucose at 182.  Liver functions  showed normal __________ 7.2 and albumin 3.1, AST 49, ALT 31.   Chest x-ray showed unchanged heart enlargement.   ASSESSMENT/PLAN:  1. 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  The patient will be on     bowel rest, supportive treatment with Demerol for pain management and     Phenergan for nausea and vomiting.  We are going to rule out any hepatic     obstructive lesion as the cause for this pancreatitis.  I am going to     order hepatic ultrasound.  2. Alcoholism.  The patient will be thiamine and folate.  3. Elevated blood pressure. This is most likely secondary to pain; however,     will put the patient on low-dose beta blocker.  4. Thrombocytopenia.  I am going to just repeat the blood test and monitor.  5. Elevated creatinine.  We are going to hydrate patient and reevaluate.                                                Renato Battles, M.D.    SA/MEDQ  D:  06/09/2003  T:  06/09/2003  Job:  [id number]

## 2010-11-30 ENCOUNTER — Emergency Department (HOSPITAL_COMMUNITY)
Admission: EM | Admit: 2010-11-30 | Discharge: 2010-12-01 | Disposition: A | Discharge status: Home / Self Care | Payer: Medicare Other | Attending: Emergency Medicine | Admitting: Emergency Medicine

## 2010-11-30 DIAGNOSIS — N186 End stage renal disease: Secondary | ICD-10-CM | POA: Insufficient documentation

## 2010-11-30 DIAGNOSIS — K7460 Unspecified cirrhosis of liver: Secondary | ICD-10-CM | POA: Insufficient documentation

## 2010-11-30 DIAGNOSIS — R0602 Shortness of breath: Secondary | ICD-10-CM | POA: Insufficient documentation

## 2010-11-30 DIAGNOSIS — I120 Hypertensive chronic kidney disease with stage 5 chronic kidney disease or end stage renal disease: Secondary | ICD-10-CM | POA: Insufficient documentation

## 2010-11-30 DIAGNOSIS — Z950 Presence of cardiac pacemaker: Secondary | ICD-10-CM | POA: Insufficient documentation

## 2010-11-30 DIAGNOSIS — Z79899 Other long term (current) drug therapy: Secondary | ICD-10-CM | POA: Insufficient documentation

## 2010-11-30 DIAGNOSIS — J40 Bronchitis, not specified as acute or chronic: Secondary | ICD-10-CM | POA: Insufficient documentation

## 2010-11-30 DIAGNOSIS — E119 Type 2 diabetes mellitus without complications: Secondary | ICD-10-CM | POA: Insufficient documentation

## 2010-11-30 LAB — GLUCOSE, CAPILLARY: Glucose-Capillary: 165 mg/dL — ABNORMAL HIGH (ref 70–99)

## 2010-12-01 ENCOUNTER — Encounter (HOSPITAL_BASED_OUTPATIENT_CLINIC_OR_DEPARTMENT_OTHER): Payer: Medicare Other | Admitting: Internal Medicine

## 2010-12-01 ENCOUNTER — Emergency Department (HOSPITAL_COMMUNITY): Payer: Medicare Other

## 2010-12-01 ENCOUNTER — Ambulatory Visit (HOSPITAL_COMMUNITY)
Admission: RE | Admit: 2010-12-01 | Discharge: 2010-12-01 | Disposition: A | Discharge status: Home / Self Care | Payer: Medicare Other | Source: Ambulatory Visit | Attending: Internal Medicine | Admitting: Internal Medicine

## 2010-12-01 DIAGNOSIS — K921 Melena: Secondary | ICD-10-CM | POA: Insufficient documentation

## 2010-12-01 DIAGNOSIS — D509 Iron deficiency anemia, unspecified: Secondary | ICD-10-CM

## 2010-12-01 DIAGNOSIS — K5521 Angiodysplasia of colon with hemorrhage: Secondary | ICD-10-CM

## 2010-12-01 DIAGNOSIS — K922 Gastrointestinal hemorrhage, unspecified: Secondary | ICD-10-CM

## 2010-12-01 DIAGNOSIS — F1020 Alcohol dependence, uncomplicated: Secondary | ICD-10-CM | POA: Insufficient documentation

## 2010-12-01 DIAGNOSIS — K625 Hemorrhage of anus and rectum: Secondary | ICD-10-CM

## 2010-12-01 DIAGNOSIS — N186 End stage renal disease: Secondary | ICD-10-CM | POA: Insufficient documentation

## 2010-12-01 DIAGNOSIS — K7030 Alcoholic cirrhosis of liver without ascites: Secondary | ICD-10-CM | POA: Insufficient documentation

## 2010-12-01 DIAGNOSIS — I789 Disease of capillaries, unspecified: Secondary | ICD-10-CM | POA: Insufficient documentation

## 2010-12-01 DIAGNOSIS — Z992 Dependence on renal dialysis: Secondary | ICD-10-CM | POA: Insufficient documentation

## 2010-12-01 HISTORY — PX: OTHER SURGICAL HISTORY: SHX169

## 2010-12-01 LAB — DIFFERENTIAL
Basophils Absolute: 0.1 10*3/uL (ref 0.0–0.1)
Eosinophils Absolute: 0.1 10*3/uL (ref 0.0–0.7)
Eosinophils Relative: 1 % (ref 0–5)
Lymphs Abs: 1.1 10*3/uL (ref 0.7–4.0)
Monocytes Absolute: 1.1 10*3/uL — ABNORMAL HIGH (ref 0.1–1.0)

## 2010-12-01 LAB — COMPREHENSIVE METABOLIC PANEL
AST: 40 U/L — ABNORMAL HIGH (ref 0–37)
Albumin: 2.1 g/dL — ABNORMAL LOW (ref 3.5–5.2)
Alkaline Phosphatase: 107 U/L (ref 39–117)
CO2: 30 mEq/L (ref 19–32)
Creatinine, Ser: 5.16 mg/dL — ABNORMAL HIGH (ref 0.4–1.5)
Glucose, Bld: 187 mg/dL — ABNORMAL HIGH (ref 70–99)
Sodium: 137 mEq/L (ref 135–145)
Total Bilirubin: 0.6 mg/dL (ref 0.3–1.2)

## 2010-12-01 LAB — CBC
MCHC: 31.9 g/dL (ref 30.0–36.0)
MCV: 96.2 fL (ref 78.0–100.0)
Platelets: 113 10*3/uL — ABNORMAL LOW (ref 150–400)
RDW: 16.6 % — ABNORMAL HIGH (ref 11.5–15.5)
WBC: 5.1 10*3/uL (ref 4.0–10.5)

## 2010-12-07 ENCOUNTER — Emergency Department (HOSPITAL_COMMUNITY): Payer: Medicare Other

## 2010-12-07 ENCOUNTER — Emergency Department (HOSPITAL_COMMUNITY)
Admission: EM | Admit: 2010-12-07 | Discharge: 2010-12-07 | Disposition: A | Discharge status: Home / Self Care | Payer: Medicare Other | Attending: Emergency Medicine | Admitting: Emergency Medicine

## 2010-12-07 DIAGNOSIS — J40 Bronchitis, not specified as acute or chronic: Secondary | ICD-10-CM | POA: Insufficient documentation

## 2010-12-07 DIAGNOSIS — R05 Cough: Secondary | ICD-10-CM | POA: Insufficient documentation

## 2010-12-07 DIAGNOSIS — R0602 Shortness of breath: Secondary | ICD-10-CM | POA: Insufficient documentation

## 2010-12-07 DIAGNOSIS — K7460 Unspecified cirrhosis of liver: Secondary | ICD-10-CM | POA: Insufficient documentation

## 2010-12-07 DIAGNOSIS — Z992 Dependence on renal dialysis: Secondary | ICD-10-CM | POA: Insufficient documentation

## 2010-12-07 DIAGNOSIS — E119 Type 2 diabetes mellitus without complications: Secondary | ICD-10-CM | POA: Insufficient documentation

## 2010-12-07 DIAGNOSIS — I120 Hypertensive chronic kidney disease with stage 5 chronic kidney disease or end stage renal disease: Secondary | ICD-10-CM | POA: Insufficient documentation

## 2010-12-07 DIAGNOSIS — N186 End stage renal disease: Secondary | ICD-10-CM | POA: Insufficient documentation

## 2010-12-07 DIAGNOSIS — R059 Cough, unspecified: Secondary | ICD-10-CM | POA: Insufficient documentation

## 2010-12-07 LAB — DIFFERENTIAL
Basophils Relative: 1 % (ref 0–1)
Eosinophils Absolute: 0.3 10*3/uL (ref 0.0–0.7)
Eosinophils Relative: 4 % (ref 0–5)
Monocytes Absolute: 0.8 10*3/uL (ref 0.1–1.0)
Monocytes Relative: 10 % (ref 3–12)

## 2010-12-07 LAB — BASIC METABOLIC PANEL
BUN: 39 mg/dL — ABNORMAL HIGH (ref 6–23)
CO2: 24 mEq/L (ref 19–32)
Calcium: 8.8 mg/dL (ref 8.4–10.5)
Chloride: 102 mEq/L (ref 96–112)
Creatinine, Ser: 7.49 mg/dL — ABNORMAL HIGH (ref 0.4–1.5)

## 2010-12-07 LAB — CBC
Hemoglobin: 7.5 g/dL — ABNORMAL LOW (ref 13.0–17.0)
MCH: 30.4 pg (ref 26.0–34.0)
MCHC: 32.2 g/dL (ref 30.0–36.0)
MCV: 94.3 fL (ref 78.0–100.0)
Platelets: 157 10*3/uL (ref 150–400)
RBC: 2.47 MIL/uL — ABNORMAL LOW (ref 4.22–5.81)

## 2010-12-10 ENCOUNTER — Encounter (HOSPITAL_BASED_OUTPATIENT_CLINIC_OR_DEPARTMENT_OTHER): Payer: Medicare Other | Admitting: Internal Medicine

## 2010-12-10 ENCOUNTER — Ambulatory Visit (HOSPITAL_COMMUNITY)
Admission: RE | Admit: 2010-12-10 | Discharge: 2010-12-10 | Disposition: A | Discharge status: Home / Self Care | Payer: Medicare Other | Source: Ambulatory Visit | Attending: Internal Medicine | Admitting: Internal Medicine

## 2010-12-10 DIAGNOSIS — D509 Iron deficiency anemia, unspecified: Secondary | ICD-10-CM

## 2010-12-10 DIAGNOSIS — Z79899 Other long term (current) drug therapy: Secondary | ICD-10-CM | POA: Insufficient documentation

## 2010-12-10 DIAGNOSIS — K31811 Angiodysplasia of stomach and duodenum with bleeding: Secondary | ICD-10-CM

## 2010-12-10 DIAGNOSIS — D649 Anemia, unspecified: Secondary | ICD-10-CM | POA: Insufficient documentation

## 2010-12-10 DIAGNOSIS — K922 Gastrointestinal hemorrhage, unspecified: Secondary | ICD-10-CM

## 2010-12-10 LAB — CBC
MCH: 29.9 pg (ref 26.0–34.0)
MCV: 92.6 fL (ref 78.0–100.0)
Platelets: 156 10*3/uL (ref 150–400)
RDW: 15.8 % — ABNORMAL HIGH (ref 11.5–15.5)

## 2010-12-10 LAB — GLUCOSE, CAPILLARY
Glucose-Capillary: 341 mg/dL — ABNORMAL HIGH (ref 70–99)
Glucose-Capillary: 352 mg/dL — ABNORMAL HIGH (ref 70–99)

## 2010-12-10 LAB — HEMOGLOBIN AND HEMATOCRIT, BLOOD: HCT: 27.3 % — ABNORMAL LOW (ref 39.0–52.0)

## 2010-12-10 LAB — BASIC METABOLIC PANEL
BUN: 42 mg/dL — ABNORMAL HIGH (ref 6–23)
CO2: 26 mEq/L (ref 19–32)
Calcium: 8.9 mg/dL (ref 8.4–10.5)
Creatinine, Ser: 5.18 mg/dL — ABNORMAL HIGH (ref 0.4–1.5)
Glucose, Bld: 280 mg/dL — ABNORMAL HIGH (ref 70–99)

## 2010-12-11 LAB — CROSSMATCH: Unit division: 0

## 2010-12-21 NOTE — Op Note (Signed)
  NAMEKEY, CEN NO.:  [id number]  MEDICAL RECORD NO.:  [id number]           PATIENT TYPE:  O  LOCATION:  DAYP                          FACILITY:  [facility]  PHYSICIAN:  Lionel December, M.D.    DATE OF BIRTH:  02-Dec-1950  DATE OF PROCEDURE:  12/01/2010 DATE OF DISCHARGE:  12/01/2010                              OPERATIVE REPORT   PROCEDURE:  Small bowel Given capsule study.  INDICATION:  Timothy Chan is a 60 year old Caucasian male with history of alcoholic cirrhosis complicated by hepatic encephalopathy and ascites as well as end-stage renal disease who is on hemodialysis.  He has been experiencing recurrent GI bleed and need for transfusion.  He apparently had EGD and colonoscopy within the last 8-10 weeks at [facility].  He previously has been evaluated at [facility] in [place], [place], but presently not on list.  He was recently hospitalized at [facility] and transfused.  The patient's son, Timothy Chan, talked with Dr. Fulton Mole, patient's hepatologist at [facility], [place], and she recommended further evaluation with Given capsule study.  Procedure risks were reviewed with the patient.  Informed consent was obtained.  FINDINGS:  The patient was able to swallow Given capsule without any difficulty.  Capsule reached the stomach in 45 minutes and in the bulb in 11 minutes and 31 seconds, and reached ileocecal valve in 3 hours 47 minutes and 11 seconds period, it was an 8-hour study.  Course review of esophageal mucosa revealed no varices.  In the stomach, there are areas of mucosa covered in fresh blood and numerous telangiectasia.  Proximal part of small bowel had coffee-ground material.  Distally, mucosa was normal.  Capsule reached colon in 3 hours and 47 minutes with view of colonic mucosa was very limited because of stool.  FINAL DIAGNOSES: 1. Examination of small bowel is very limited because of coffee-     ground, felt to be originating from  stomach. 2. 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     Suspect these changes are due to severe portal gastropathy.  RECOMMENDATIONS: 1. These findings were reviewed with the patient's son, Timothy Chan     over the phone. 2. I recommended that if he develop postal symptoms or evidence of     active bleeding, he should preferably be taken to [facility] and they can     decide if they could treat him with argon plasma coagulator.     Unfortunately,     treatment options are very limited. 3. If patient does not want to go back to [facility], he can consider coming     to [facility] in [place] or go to [facility] and we would     offer him argon plasma, we will offer him endoscopic therapy with     APC.          ______________________________ Lionel December, M.D.     NR/MEDQ  D:  12/03/2010  T:  12/04/2010  Job:  [id number]  cc:   Dr. Sherril Croon  Electronically Signed by Lionel December M.D. on 12/21/2010 02:14:04 PM

## 2010-12-21 NOTE — Op Note (Signed)
[name], Chan                  ACCOUNT NO.:  [id number]  MEDICAL RECORD NO.:  [id number]           PATIENT TYPE:  O  LOCATION:  A322                          FACILITY:  [facility]  PHYSICIAN:  Lionel December, M.D.    DATE OF BIRTH:  Nov 28, 1950  DATE OF PROCEDURE:  12/10/2010 DATE OF DISCHARGE:  12/10/2010                              OPERATIVE REPORT   PROCEDURE:  Esophagogastroduodenoscopy with APC ablation of gastric antral vascular ectasia.  INDICATION:  Timothy Chan is a 60 year old Caucasian male with multiple medical problems including alcoholic cirrhosis who has been requiring frequent transfusions.  He has had EGD and colonoscopy at other institutions.  He had a Given capsule by me last week which shows multiple telangiectasia in his stomach, some covered with fresh blood.  He is therefore admitted losing blood from his stomach.  I reviewed the findings with the patient and his son and also talked with Jim Like, patient's hepatologist at [facility] and we felt that he would benefit from [facility] therapy.  Because the patient lives in [place], it would be difficult for him to travel back-and-forth to [facility] and he is therefore undergoing this therapy at this facility.  Procedure risks were reviewed with the patient. Informed consent was obtained.  Please note, the patient's hemoglobin this morning is 7.3.  His INR is 1.12 and his platelet count is 155,000.  MEDICATIONS FOR CONSCIOUS SEDATION:  Cetacaine spray for pharyngeal topical anesthesia, Demerol 50 mg IV, and Versed 6 mg IV.  FINDINGS:  Procedure performed in endoscopy suite.  The patient's vital signs and O2 saturations were monitored during the procedure and remained stable.  The patient was placed in left lateral recumbent position and Pentax videoscope was passed via oropharynx without any difficulty into esophagus.  Esophagus:  Mucosa of the esophagus was normal except distally there were 2 patches of pink mucosa  suspicious for short segment Barrett's. This was left alone.  No varices were identified.  GE junction was at 43 cm from the incisors.  Stomach:  It was empty and distended very well with insufflation.  No blood was noted in the stomach.  There was mucosal edema at gastric body with few red spots best seen on retroflex view.  However, in the antrum, there were multiple bright red telangiectasia i.e., watermelon stomach. None of these were bleeding.  Pictures were taken for the record. Angularis also had few of these lesions.  Fundus and cardia were examined and no varices were identified.  Duodenum:  Bulbar and postbulbar mucosa was normal.  Endoscope was pulled back in the stomach and the procedure with APC therapy of these lesions.  More than 50% of these lesions treated, some bled transiently, one on application of energy stopped bleeding. Pictures were taken post APC therapy for the record.  Endoscope was withdrawn.  The patient tolerated the procedure well.  FINAL DIAGNOSES: 1. 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 2. Mild changes of portal gastropathy. 3. Gastric antral vascular ectasia.  More than 50% of these lesions     were ablated with argon plasma coagulator. 4. No evidence of esophageal  and gastric varices or peptic ulcer     disease.  RECOMMENDATIONS: 1. The patient will be admitted as an observation and he will be given     2 units of PRBCs before he goes home. 2. The patient was advised not to take any OTC NSAIDs or aspirin. 3. We will bring him back in 2-3 weeks for repeat therapy.          ______________________________ Lionel December, M.D.     NR/MEDQ  D:  12/10/2010  T:  12/10/2010  Job:  [id number]  cc:   Dr. Barnett Abu, [facility] [place], [place] [facility], [place], [place]  Electronically Signed by Lionel December M.D. on 12/21/2010 02:14:17 PM

## 2010-12-31 ENCOUNTER — Encounter (HOSPITAL_BASED_OUTPATIENT_CLINIC_OR_DEPARTMENT_OTHER): Payer: Medicare Other | Admitting: Internal Medicine

## 2010-12-31 ENCOUNTER — Ambulatory Visit (HOSPITAL_COMMUNITY)
Admission: RE | Admit: 2010-12-31 | Discharge: 2010-12-31 | Disposition: A | Discharge status: Home / Self Care | Payer: Medicare Other | Source: Ambulatory Visit | Attending: Internal Medicine | Admitting: Internal Medicine

## 2010-12-31 DIAGNOSIS — K259 Gastric ulcer, unspecified as acute or chronic, without hemorrhage or perforation: Secondary | ICD-10-CM | POA: Insufficient documentation

## 2010-12-31 DIAGNOSIS — K31811 Angiodysplasia of stomach and duodenum with bleeding: Secondary | ICD-10-CM | POA: Insufficient documentation

## 2010-12-31 DIAGNOSIS — N186 End stage renal disease: Secondary | ICD-10-CM | POA: Insufficient documentation

## 2010-12-31 DIAGNOSIS — Z992 Dependence on renal dialysis: Secondary | ICD-10-CM | POA: Insufficient documentation

## 2010-12-31 DIAGNOSIS — K922 Gastrointestinal hemorrhage, unspecified: Secondary | ICD-10-CM

## 2010-12-31 DIAGNOSIS — K31819 Angiodysplasia of stomach and duodenum without bleeding: Secondary | ICD-10-CM

## 2010-12-31 DIAGNOSIS — K7460 Unspecified cirrhosis of liver: Secondary | ICD-10-CM

## 2010-12-31 DIAGNOSIS — E119 Type 2 diabetes mellitus without complications: Secondary | ICD-10-CM | POA: Insufficient documentation

## 2010-12-31 HISTORY — PX: ESOPHAGOGASTRODUODENOSCOPY: SHX1529

## 2011-01-19 NOTE — Op Note (Signed)
NAMEOSIE, AMPARO NO.:  [id number]  MEDICAL RECORD NO.:  [id number]  LOCATION:  DAYP                          FACILITY:  [facility]  PHYSICIAN:  Lionel December, M.D.    DATE OF BIRTH:  1950/10/07  DATE OF PROCEDURE:  12/31/2010 DATE OF DISCHARGE:                              OPERATIVE REPORT   PROCEDURE:  Esophagogastroduodenoscopy with APC therapy of gastric antral vascular ectasia (second session).  INDICATION:  Timothy Chan is a 60 year old Caucasian male with end-stage cirrhosis as well as end-stage renal disease, was on hemodialysis.  He was recently evaluated for recurrent melena and found to have gastric antral vascular ectasia with active bleeding on given capsule study.  He underwent first session of APC therapy on Dec 10, 2010 and he states he has not had any need for transfusion and has not had black stools.  He is returning for second session today.  Procedure risks were reviewed with the patient.  Informed consent was obtained.  MEDICATIONS FOR CONSCIOUS SEDATION:  Cetacaine spray for pharyngeal topical anesthesia, Demerol 50 mg IV and Versed 6 mg IV.  FINDINGS:  Procedure performed in endoscopy suite.  The patient was placed in left lateral recumbent position and Pentax videoscope was passed oropharynx without any difficulty into esophagus.  Esophagus, mucosa of the esophagus was normal.  No varices were identified.  GE junction was at 43 cm.  Stomach, it was emptied and distended very well insufflation.  Folds of proximal stomach revealed some edema and mosaic pattern consistent with changes of portal gastropathy.  There was some coffee-ground material in the antrum and there was oozing from four of these antral vacular and angiectasia.  There were quite a few left even though several were treated 3 weeks ago.  There are few small ulcers sites which were retreated.  Pyloric channel was patent.  Angularis, fundus, and cardia were examined by  retroflexing scope and no varices were identified.  Duodenum, bulbar and postbulbar mucosa was normal.  Scope was passed into second part of duodenum where mucosa and folds were normal.  Endoscope was pulled back in the stomach and several of these vascular lesions were treated, starting with once which were oozing.  Most of these lesions were treated.  Pictures taken for the record.  Endoscope was withdrawn.  The patient tolerated the procedure well.  FINAL DIAGNOSES: 1. Active bleeding/oozing noted from 4 or 5 vascular lesions at antrum     (GAVE).  These and others were treated with APC. 2. Mild change of portal gastropathy. 3. No evidence of esophageal/gastric varices. 4. Few small ulcers noted at gastric antrum sites where he had APC     therapy.  RECOMMENDATIONS: 1. Standard instructions given. 2. Carafate liquid 1 g a.c. and nightly for 2 weeks. 3. We will try to get a copy of his H and H from the [facility]ialysis [facility]. 4. We will repeat his H and H in 3-4 weeks.          ______________________________ Lionel December, M.D.     NR/MEDQ  D:  12/31/2010  T:  01/01/2011  Job:  [id number]  cc:   Doreen Beam, MD Fax:  [phone]  Viviana Simpler, PA Hepatology Department [place] [facility] [place], [facility] [place]  Electronically Signed by Lionel December M.D. on 01/19/2011 08:18:02 PM

## 2011-02-09 ENCOUNTER — Encounter (INDEPENDENT_AMBULATORY_CARE_PROVIDER_SITE_OTHER): Payer: Self-pay

## 2011-02-22 ENCOUNTER — Emergency Department (HOSPITAL_COMMUNITY)
Admission: EM | Admit: 2011-02-22 | Discharge: 2011-02-22 | Disposition: A | Discharge status: Home / Self Care | Payer: Medicare Other | Attending: Emergency Medicine | Admitting: Emergency Medicine

## 2011-02-22 ENCOUNTER — Encounter (HOSPITAL_COMMUNITY): Payer: Self-pay | Admitting: *Deleted

## 2011-02-22 ENCOUNTER — Emergency Department (HOSPITAL_COMMUNITY): Payer: Medicare Other

## 2011-02-22 DIAGNOSIS — N186 End stage renal disease: Secondary | ICD-10-CM

## 2011-02-22 DIAGNOSIS — F17200 Nicotine dependence, unspecified, uncomplicated: Secondary | ICD-10-CM | POA: Insufficient documentation

## 2011-02-22 DIAGNOSIS — Z992 Dependence on renal dialysis: Secondary | ICD-10-CM | POA: Insufficient documentation

## 2011-02-22 DIAGNOSIS — K7682 Hepatic encephalopathy: Secondary | ICD-10-CM

## 2011-02-22 DIAGNOSIS — K7290 Hepatic failure, unspecified without coma: Secondary | ICD-10-CM | POA: Insufficient documentation

## 2011-02-22 DIAGNOSIS — E119 Type 2 diabetes mellitus without complications: Secondary | ICD-10-CM | POA: Insufficient documentation

## 2011-02-22 DIAGNOSIS — K7689 Other specified diseases of liver: Secondary | ICD-10-CM | POA: Insufficient documentation

## 2011-02-22 DIAGNOSIS — I120 Hypertensive chronic kidney disease with stage 5 chronic kidney disease or end stage renal disease: Secondary | ICD-10-CM | POA: Insufficient documentation

## 2011-02-22 LAB — CBC
HCT: 26.7 % — ABNORMAL LOW (ref 39.0–52.0)
Platelets: 192 10*3/uL (ref 150–400)
RDW: 17.9 % — ABNORMAL HIGH (ref 11.5–15.5)
WBC: 6.1 10*3/uL (ref 4.0–10.5)

## 2011-02-22 LAB — URINALYSIS, ROUTINE W REFLEX MICROSCOPIC
Bilirubin Urine: NEGATIVE
Glucose, UA: 250 mg/dL — AB
Ketones, ur: NEGATIVE mg/dL
Protein, ur: 100 mg/dL — AB

## 2011-02-22 LAB — COMPREHENSIVE METABOLIC PANEL
AST: 19 U/L (ref 0–37)
Albumin: 2.5 g/dL — ABNORMAL LOW (ref 3.5–5.2)
Alkaline Phosphatase: 112 U/L (ref 39–117)
BUN: 43 mg/dL — ABNORMAL HIGH (ref 6–23)
Chloride: 107 mEq/L (ref 96–112)
Potassium: 4 mEq/L (ref 3.5–5.1)
Total Bilirubin: 0.6 mg/dL (ref 0.3–1.2)

## 2011-02-22 LAB — URINE MICROSCOPIC-ADD ON

## 2011-02-22 LAB — AMMONIA: Ammonia: 102 umol/L — ABNORMAL HIGH (ref 11–60)

## 2011-02-22 MED ORDER — LACTULOSE 10 GM/15ML PO SOLN: 20.0000 g | Freq: Three times a day (TID) | ORAL | Status: AC

## 2011-02-22 MED ORDER — LACTULOSE 10 GM/15ML PO SOLN
20.0000 g | Freq: Once | ORAL | Status: AC
  Administered 2011-02-22: 20 g via ORAL
  Filled 2011-02-22: qty 30

## 2011-02-22 MED ORDER — LACTULOSE 10 GM/15ML PO SOLN
ORAL | Status: AC
  Filled 2011-02-22: qty 30

## 2011-02-22 MED ORDER — LACTULOSE ENEMA
300.0000 mL | Freq: Once | ORAL | Status: DC
  Filled 2011-02-22: qty 300

## 2011-02-22 NOTE — ED Notes (Signed)
Patient states his legs hurt and he was pulled up in bed by D. Mabe and T. Abbott. Patient was given a warm blanket for comfort.

## 2011-02-22 NOTE — ED Notes (Signed)
In and Out done per Dr. Adriana Simas. Clean and clear in color. Performed by Stephens Shire NT Assisted by Fran Lowes.

## 2011-02-22 NOTE — ED Notes (Signed)
Pt was sent from dialysis because he had altered mental status. Stated that he wasn't acting right. Pt alert and oriented x 3 at this time but states that he doesn't feel right. Pt did not have his dialysis treatment this am. States that he just feels weak and sleepy. Denies pain.

## 2011-02-22 NOTE — ED Notes (Signed)
Patient states that he is on dialysis and can not urinate right now but a urinal was left just in case some could be given. RN Jill Alexanders aware.

## 2011-02-22 NOTE — ED Notes (Signed)
Pt resting in bed no noted distress no stated needs 

## 2011-02-22 NOTE — ED Provider Notes (Addendum)
History    Scribed for Donnetta Hutching, MD, the patient was seen in room APA11/APA11. This chart was scribed by Katha Cabal.    CSN: [id number] Arrival date & time: 02/22/2011 12:28 PM  Chief Complaint  Patient presents with  . Altered Mental Status   HPI Timothy Chan is a 60 year old male that was brought to the ED by friend for AMS onset unknown.  Notes associated weakness.  Denies SOB and chest pain.  Pt is in end stage renal disease with hx of alcohol abuse and cirrhosis of the liver.  Pt missed dialysis today and is usually scheduled for M/W/F.   Patient notes AMS hx with increased ammonia levels in the past.   HPI ELEMENTS:  Onset: unknown Duration:perisisent since onset Timing: constat  Context: Pt missed dialysis today.  Associated symptoms: weakness, denies SOB, denies chest pain   PAST MEDICAL HISTORY:  Past Medical History  Diagnosis Date  . Rectal bleeding   . Dialysis care   . GI bleed   . Hypertension   . Esophageal varices   . Alcoholic liver disease   . Hepatic encephalopathy   . Ascites   . Diabetes mellitus     PAST SURGICAL HISTORY:  Past Surgical History  Procedure Date  . Esophagogastroduodenoscopy 12/31/2010    EGD APC THERAPY  . Esophagogastroduodenoscopy 05/31/2012E    GD APC ABLATION  . Small bowel givens 12/01/2010  . Colonoscopy 09/07/2010  . Esophagogastroduodenoscopy 09/05/2010    OUTLAW    MEDICATIONS:  Previous Medications   ESOMEPRAZOLE (NEXIUM) 40 MG CAPSULE    Take 40 mg by mouth daily before breakfast.     FOLIC ACID (FOLVITE) 1 MG TABLET    Take 1 mg by mouth daily.     GLIPIZIDE (GLUCOTROL XL) 5 MG 24 HR TABLET    Take 5 mg by mouth at bedtime. IF NEEDED TAKE ONE TABLET IN THE MORNING FOR HIGH GLUCOSE LEVELS    GLIPIZIDE (GLUCOTROL) 5 MG TABLET    Take 5 mg by mouth 2 (two) times daily before a meal.     LACTULOSE (CHRONULAC) 10 GM/15ML SOLUTION    Take 60 mLs by mouth 2 (two) times daily.     LANTHANUM (FOSRENOL) 1000 MG CHEWABLE  TABLET    Chew 500 mg by mouth 3 (three) times daily with meals.    LIDOCAINE (XYLOCAINE) 2 % JELLY    Apply topically as needed.     LIDOCAINE-PRILOCAINE (EMLA) CREAM    Apply 1 application topically as needed. APPLY PRIOR TO DIALYSIS    ONDANSETRON (ZOFRAN) 4 MG TABLET    Take 4 mg by mouth every 8 (eight) hours as needed. FOR NAUSEA   POTASSIUM CHLORIDE (K-DUR) 10 MEQ TABLET    Take 30 mEq by mouth 3 (three) times a week. TAKE 3 TABLETS ONCE A DAY THREE TIMES PER WEEK AFTER DIALYSIS   RIFAXIMIN (XIFAXAN) 200 MG TABLET    Take by mouth.     RIFAXIMIN (XIFAXAN) 550 MG TABS    Take 175 mg by mouth 2 (two) times daily. TAKE ONE HALF OF A TABLET TWICE A DAY    SODIUM BICARBONATE 650 MG TABLET    Take 650 mg by mouth 2 (two) times daily.    TRAMADOL (ULTRAM) 50 MG TABLET    Take 50 mg by mouth every 6 (six) hours as needed. For PAIN   ZINC GLUCONATE 50 MG TABLET    Take 50 mg by mouth daily.  ALLERGIES:  Allergies as of 02/22/2011 - Review Complete 02/22/2011  Allergen Reaction Noted  . Tylenol (acetaminophen) Other (See Comments) 02/09/2011  . Morphine and related Itching 02/09/2011     FAMILY HISTORY:  History reviewed. No pertinent family history.   SOCIAL HISTORY: History   Social History  . Marital Status: Married    Spouse Name: N/A    Number of Children: N/A  . Years of Education: N/A   Social History Main Topics  . Smoking status: Current Everyday Smoker -- 0.5 packs/day  . Smokeless tobacco: None  . Alcohol Use: No  . Drug Use: No  . Sexually Active:    Other Topics Concern  . None   Social History Narrative  . None    Review of Systems 10 Systems reviewed and are negative for acute change except as noted in the HPI.  Physical Exam  BP 172/62  Pulse 87  Temp(Src) 98.3 F (36.8 C) (Oral)  Resp 18  Ht 6\' 3"  (1.905 m)  Wt 260 lb (117.935 kg)  BMI 32.50 kg/m2  SpO2 100%  Physical Exam  Nursing note and vitals reviewed. Constitutional: He appears  well-developed and well-nourished.  HENT:  Head: Normocephalic and atraumatic.  Eyes: Pupils are equal, round, and reactive to light.  Neck: Normal range of motion. Neck supple.  Cardiovascular: Normal rate and regular rhythm.   No murmur heard. Pulmonary/Chest: Effort normal. He has no wheezes.  Abdominal: Soft. He exhibits no distension. There is no tenderness.  Musculoskeletal: Normal range of motion. He exhibits no edema.  Neurological: No sensory deficit.  Skin: Skin is warm and dry.       Slight jaundice   Psychiatric:       Flat affect    ED Course  Procedures  OTHER DATA REVIEWED: Nursing notes, vital signs, and past medical records reviewed.    DIAGNOSTIC STUDIES: Oxygen Saturation is 100% on room air normal, by my interpretation.     LABS / RADIOLOGY: Results for orders placed during the hospital encounter of 02/22/11  GLUCOSE, CAPILLARY      Component Value Range   Glucose-Capillary 149 (*) 70 - 99 (mg/dL)  AMMONIA      Component Value Range   Ammonia 102 (*) 11 - 60 (umol/L)  CBC      Component Value Range   WBC 6.1  4.0 - 10.5 (K/uL)   RBC 2.94 (*) 4.22 - 5.81 (MIL/uL)   Hemoglobin 8.4 (*) 13.0 - 17.0 (g/dL)   HCT 16.1 (*) 09.6 - 52.0 (%)   MCV 90.8  78.0 - 100.0 (fL)   MCH 28.6  26.0 - 34.0 (pg)   MCHC 31.5  30.0 - 36.0 (g/dL)   RDW 04.5 (*) 40.9 - 15.5 (%)   Platelets 192  150 - 400 (K/uL)  COMPREHENSIVE METABOLIC PANEL      Component Value Range   Sodium 143  135 - 145 (mEq/L)   Potassium 4.0  3.5 - 5.1 (mEq/L)   Chloride 107  96 - 112 (mEq/L)   CO2 18 (*) 19 - 32 (mEq/L)   Glucose, Bld 142 (*) 70 - 99 (mg/dL)   BUN 43 (*) 6 - 23 (mg/dL)   Creatinine, Ser 8.11 (*) 0.50 - 1.35 (mg/dL)   Calcium 9.4  8.4 - 91.4 (mg/dL)   Total Protein 7.1  6.0 - 8.3 (g/dL)   Albumin 2.5 (*) 3.5 - 5.2 (g/dL)   AST 19  0 - 37 (U/L)   ALT 12  0 - 53 (U/L)   Alkaline Phosphatase 112  39 - 117 (U/L)   Total Bilirubin 0.6  0.3 - 1.2 (mg/dL)   GFR calc non Af  Amer 7 (*) >60 (mL/min)   GFR calc Af Amer 9 (*) >60 (mL/min)  URINALYSIS, ROUTINE W REFLEX MICROSCOPIC      Component Value Range   Color, Urine YELLOW  YELLOW    Appearance CLEAR  CLEAR    Specific Gravity, Urine 1.010  1.005 - 1.030    pH 8.0  5.0 - 8.0    Glucose, UA 250 (*) NEGATIVE (mg/dL)   Hgb urine dipstick TRACE (*) NEGATIVE    Bilirubin Urine NEGATIVE  NEGATIVE    Ketones, ur NEGATIVE  NEGATIVE (mg/dL)   Protein, ur 829 (*) NEGATIVE (mg/dL)   Urobilinogen, UA 0.2  0.0 - 1.0 (mg/dL)   Nitrite NEGATIVE  NEGATIVE    Leukocytes, UA NEGATIVE  NEGATIVE   URINE MICROSCOPIC-ADD ON      Component Value Range   Squamous Epithelial / LPF RARE  RARE    WBC, UA 0-2  <3 (WBC/hpf)   RBC / HPF 0-2  <3 (RBC/hpf)   Bacteria, UA RARE  RARE    Casts HYALINE CASTS (*) NEGATIVE    Dg Chest 2 View  02/22/2011  *RADIOLOGY REPORT*  Clinical Data: Weakness  CHEST - 2 VIEW  Comparison: 12/07/2010  Findings: Stable right pleural thickening/fluid.  Lungs are otherwise essentially clear. No pneumothorax.  Stable mild cardiomegaly.  Degenerative changes of the visualized thoracolumbar spine.  IMPRESSION: Stable right pleural thickening/fluid.  Stable mild cardiomegaly.  Original Report Authenticated By: Charline Bills, M.D.    PROCEDURES: Level V caveat severe illness and urgent need for intervention  ED COURSE / COORDINATION OF CARE: Admit for hepatic encephalopathy and end-stage renal disease   MDM: Differential Diagnosis:  Labs Ordered: Hepatic Function, Lipase, Ammonia Level, CBC, UA   PLAN:  The patient is to return the emergency department if there is any worsening of symptoms. I have reviewed the discharge instructions with the patient/family   CONDITION ON DISCHARGE: (Stable, Improved, Guarded, Critical)   MEDICATIONS GIVEN IN THE E.D.  Medications  glipiZIDE (GLUCOTROL XL) 5 MG 24 hr tablet (not administered)  lidocaine-prilocaine (EMLA) cream (not administered)  lactulose  (CHRONULAC) 10 GM/15ML solution (not administered)  rifaximin (XIFAXAN) 550 MG TABS (not administered)      I personally performed the services described in this documentation, which was scribed  in my presence. The recorded information has been reviewed and considered. Donnetta Hutching, MD    Patient is more alert. Does not want to stay in hospital. Will go home with family member. Is nontoxic at this time.    Donnetta Hutching, MD 02/22/11 1844  Donnetta Hutching, MD 02/22/11 5621  Donnetta Hutching, MD 02/22/11 2015

## 2011-02-22 NOTE — ED Notes (Signed)
Pt resting in bed in room no noted distress pt stating he is bored

## 2011-02-22 NOTE — ED Notes (Signed)
Patient is comfortable does not need anything at this time. 

## 2011-03-04 ENCOUNTER — Encounter (INDEPENDENT_AMBULATORY_CARE_PROVIDER_SITE_OTHER): Payer: Self-pay | Admitting: Internal Medicine

## 2011-03-04 ENCOUNTER — Ambulatory Visit (INDEPENDENT_AMBULATORY_CARE_PROVIDER_SITE_OTHER): Payer: Medicare Other | Admitting: Internal Medicine

## 2011-03-04 VITALS — BP 122/48 | HR 88 | Temp 97.9°F | Ht 75.0 in | Wt 224.1 lb

## 2011-03-04 DIAGNOSIS — K922 Gastrointestinal hemorrhage, unspecified: Secondary | ICD-10-CM

## 2011-03-04 DIAGNOSIS — K7460 Unspecified cirrhosis of liver: Secondary | ICD-10-CM

## 2011-03-04 DIAGNOSIS — D649 Anemia, unspecified: Secondary | ICD-10-CM

## 2011-03-04 LAB — CBC WITH DIFFERENTIAL/PLATELET
Basophils Relative: 1 % (ref 0–1)
Eosinophils Absolute: 0.3 10*3/uL (ref 0.0–0.7)
Eosinophils Relative: 3 % (ref 0–5)
Hemoglobin: 7.3 g/dL — ABNORMAL LOW (ref 13.0–17.0)
Lymphs Abs: 2.6 10*3/uL (ref 0.7–4.0)
MCH: 28.4 pg (ref 26.0–34.0)
MCHC: 30.2 g/dL (ref 30.0–36.0)
MCV: 94.2 fL (ref 78.0–100.0)
Monocytes Relative: 10 % (ref 3–12)
Neutrophils Relative %: 58 % (ref 43–77)
Platelets: 214 10*3/uL (ref 150–400)
RBC: 2.57 MIL/uL — ABNORMAL LOW (ref 4.22–5.81)

## 2011-03-04 NOTE — Progress Notes (Signed)
Subjective:     Patient ID: Timothy Chan, male   DOB: 12/17/1950, 60 y.o.   MRN: [id number]  HPI  Mr. Nathanyal presents today for f/u.  He has a history gastric antral vascular ectasis.  Deniece Portela has a hx of end stage cirrhosis as well as end stage renal disease. He is on hemodialysis.  He had a hx of recurrent melena and found to have gastric antral vascular with active bleeding on given capsule study. He underwent first session of APC therapy on Dec 10, 2010. In June he underwent his second session of APC therapy which revealed active bleeding,oozing noted from 4-5 vascular lesion at antrum. These and others were treated with APC.  Mild changes of portal gastropathy. No evidence of esophageal/gastric varicies.   Few small ulcers noted at gastric antrum sites where he had APC therapy.  He tells me he stools are green.  He denies seeing any rectal bleeding or melena.  Hemoglobin  02/19/2011: 8.2.  02/12/2011: 8.8, 02/03/2011: 9.7. 11/18/2010 9.4 and 28.8. His appetite is good. No weight loss.   He says he has not drank in over 20 yrs.  He takes dialysis Mon-Wed-Fridays.   Review of Systems see hpi  Current Outpatient Prescriptions  Medication Sig Dispense Refill  . esomeprazole (NEXIUM) 40 MG capsule Take 40 mg by mouth daily before breakfast.        . folic acid (FOLVITE) 1 MG tablet Take 1 mg by mouth daily.        [name] glipiZIDE (GLUCOTROL XL) 5 MG 24 hr tablet Take 5 mg by mouth at bedtime. IF NEEDED TAKE ONE TABLET IN THE MORNING FOR HIGH GLUCOSE LEVELS      . lactulose (CHRONULAC) 10 GM/15ML solution Take 60 mLs by mouth 2 (two) times daily.        [name] lactulose (CHRONULAC) 10 GM/15ML solution Take 30 mLs (20 g total) by mouth 3 (three) times daily.  240 mL  0  . lanthanum (FOSRENOL) 1000 MG chewable tablet Chew 500 mg by mouth 3 (three) times daily with meals.       . lidocaine (XYLOCAINE) 2 % jelly Apply topically as needed.        . lidocaine-prilocaine (EMLA) cream Apply 1 application topically as needed.  APPLY PRIOR TO DIALYSIS       . ondansetron (ZOFRAN) 4 MG tablet Take 4 mg by mouth every 8 (eight) hours as needed. FOR NAUSEA      . potassium chloride (K-DUR) 10 MEQ tablet Take 30 mEq by mouth 3 (three) times a week. TAKE 3 TABLETS ONCE A DAY THREE TIMES PER WEEK AFTER DIALYSIS      . rifaximin (XIFAXAN) 200 MG tablet Take 250 mg by mouth 2 (two) times daily.       . traMADol (ULTRAM) 50 MG tablet Take 50 mg by mouth every 6 (six) hours as needed. For PAIN      . zinc gluconate 50 MG tablet Take 50 mg by mouth daily.        [name] glipiZIDE (GLUCOTROL) 5 MG tablet Take 5 mg by mouth 2 (two) times daily before a meal.        . rifaximin (XIFAXAN) 550 MG TABS Take 175 mg by mouth 2 (two) times daily. TAKE ONE HALF OF A TABLET TWICE A DAY       . sodium bicarbonate 650 MG tablet Take 650 mg by mouth 2 (two) times daily.       [name] Past  Surgical History  Procedure Date  . Esophagogastroduodenoscopy 12/31/2010    EGD APC THERAPY  . Esophagogastroduodenoscopy 05/31/2012E    GD APC ABLATION  . Small bowel givens 12/01/2010  . Colonoscopy 09/07/2010  . Esophagogastroduodenoscopy 09/05/2010    OUTLAW      Past Medical History  Diagnosis Date  . Rectal bleeding   . Dialysis care   . GI bleed   . Hypertension   . Esophageal varices   . Alcoholic liver disease   . Hepatic encephalopathy   . Ascites   . Diabetes mellitus         Objective:   Physical Exam  Blood pressure 122/48, pulse 88, temperature 97.9 F (36.6 C), height 6\' 3"  (1.905 m), weight 224 lb 1.6 oz (101.651 kg).    Alert and oriented. Skin warm and dry. Oral mucosa is moist. Dentures in place. Sclera anicteric, conjunctivae is pink. Thyroid not enlarged. No cervical lymphadenopathy. Lungs clear. Heart regular rate and rhythm.  Abdomen is soft. Bowel sounds are positive. No hepatomegaly. No abdominal masses felt. No tenderness.  No edema to lower extremities. Patient is alert and oriented.      Assessment:   GAVE.  He has  had 2 sessions of APC therapy.  He has a slight drop in his hemoglobin.     Plan:    Repeat CBC today and discuss with Dr Karilyn Cota about possible transfusion.

## 2011-03-05 ENCOUNTER — Telehealth (INDEPENDENT_AMBULATORY_CARE_PROVIDER_SITE_OTHER): Payer: Self-pay | Admitting: Internal Medicine

## 2011-03-05 NOTE — Telephone Encounter (Signed)
Results given to patient.  He will come by office Monday.  I am going to guaiac his stool. If it is positive, he will be scheduled for an EGD with APC.  He is presently be transfused at [facility] in [place] for hemoglobin of 7.3

## 2011-03-08 ENCOUNTER — Telehealth (INDEPENDENT_AMBULATORY_CARE_PROVIDER_SITE_OTHER): Payer: Self-pay | Admitting: *Deleted

## 2011-03-08 NOTE — Telephone Encounter (Signed)
tammy Hyman wayne daltons family member margaret called this morning she states mr Huston told her he was to have a procedure done this morning ,that when he called him Friday nur told him his blood was low .she stated he is really confused and she needed to verify what Tyler Deis told him she said can nurse call her at [phone] work or cell # [phone]

## 2011-03-10 ENCOUNTER — Telehealth (INDEPENDENT_AMBULATORY_CARE_PROVIDER_SITE_OTHER): Payer: Self-pay | Admitting: Internal Medicine

## 2011-03-10 NOTE — Telephone Encounter (Signed)
Patient to be seen in office today by Delrae Rend

## 2011-03-10 NOTE — Telephone Encounter (Signed)
Timothy Chan presented today for me to guaiac his stool. He was guaiac positive in the office today. He was transfused last Friday 03/05/2011 with 2 units of PRBCs for a hemoglobin of 7.6.  He went home and became sick and developed a rash.  He went back to the hospital and received 2 more units.  After dialysis on Monday he received two more units for a hemoglobin 6.8.   Timothy Chan, Timothy Chan will need an EGD with APC theapy very soon.  He is having rectal bleeding and he is anemic.  Next week if you can.  Thank Camelia Eng

## 2011-03-11 ENCOUNTER — Telehealth (INDEPENDENT_AMBULATORY_CARE_PROVIDER_SITE_OTHER): Payer: Self-pay | Admitting: *Deleted

## 2011-03-11 DIAGNOSIS — K922 Gastrointestinal hemorrhage, unspecified: Secondary | ICD-10-CM

## 2011-03-11 DIAGNOSIS — R195 Other fecal abnormalities: Secondary | ICD-10-CM

## 2011-03-11 NOTE — Telephone Encounter (Signed)
Per Terri patient will need EGD w/ APC soon, per Dr Karilyn Cota ok to sch'd 03/26/11  EGD sch'd 03/26/11 @ 8:30 (7:30), lmom advising patient of appt, instructions mailed

## 2011-03-11 NOTE — Telephone Encounter (Signed)
EGD sch'd 03/26/11

## 2011-03-24 ENCOUNTER — Encounter (HOSPITAL_COMMUNITY): Payer: Self-pay | Admitting: *Deleted

## 2011-03-24 ENCOUNTER — Inpatient Hospital Stay (HOSPITAL_COMMUNITY)
Admission: EM | Admit: 2011-03-24 | Discharge: 2011-03-25 | DRG: 811 | Disposition: A | Discharge status: Home / Self Care | Payer: Medicare Other | Attending: Family Medicine | Admitting: Family Medicine

## 2011-03-24 DIAGNOSIS — Z992 Dependence on renal dialysis: Secondary | ICD-10-CM

## 2011-03-24 DIAGNOSIS — K31811 Angiodysplasia of stomach and duodenum with bleeding: Secondary | ICD-10-CM | POA: Diagnosis present

## 2011-03-24 DIAGNOSIS — N186 End stage renal disease: Secondary | ICD-10-CM

## 2011-03-24 DIAGNOSIS — D649 Anemia, unspecified: Secondary | ICD-10-CM

## 2011-03-24 DIAGNOSIS — D631 Anemia in chronic kidney disease: Secondary | ICD-10-CM | POA: Diagnosis present

## 2011-03-24 DIAGNOSIS — D62 Acute posthemorrhagic anemia: Principal | ICD-10-CM | POA: Diagnosis present

## 2011-03-24 DIAGNOSIS — I120 Hypertensive chronic kidney disease with stage 5 chronic kidney disease or end stage renal disease: Secondary | ICD-10-CM | POA: Diagnosis present

## 2011-03-24 DIAGNOSIS — K7030 Alcoholic cirrhosis of liver without ascites: Secondary | ICD-10-CM | POA: Diagnosis present

## 2011-03-24 LAB — COMPREHENSIVE METABOLIC PANEL
AST: 18 U/L (ref 0–37)
Albumin: 1.8 g/dL — ABNORMAL LOW (ref 3.5–5.2)
CO2: 34 mEq/L — ABNORMAL HIGH (ref 19–32)
Calcium: 7.8 mg/dL — ABNORMAL LOW (ref 8.4–10.5)
Creatinine, Ser: 2.81 mg/dL — ABNORMAL HIGH (ref 0.50–1.35)
GFR calc non Af Amer: 23 mL/min — ABNORMAL LOW (ref >60)
Sodium: 138 mEq/L (ref 135–145)
Total Protein: 5.4 g/dL — ABNORMAL LOW (ref 6.0–8.3)

## 2011-03-24 LAB — CBC
HCT: 19.4 % — ABNORMAL LOW (ref 39.0–52.0)
MCHC: 30.9 g/dL (ref 30.0–36.0)
MCV: 93.3 fL (ref 78.0–100.0)
Platelets: 178 10*3/uL (ref 150–400)
RDW: 16.9 % — ABNORMAL HIGH (ref 11.5–15.5)

## 2011-03-24 LAB — DIFFERENTIAL
Basophils Absolute: 0 10*3/uL (ref 0.0–0.1)
Basophils Relative: 1 % (ref 0–1)
Eosinophils Absolute: 0.1 10*3/uL (ref 0.0–0.7)
Eosinophils Relative: 3 % (ref 0–5)
Monocytes Absolute: 0.6 10*3/uL (ref 0.1–1.0)

## 2011-03-24 MED ORDER — GLIPIZIDE 5 MG PO TABS
5.0000 mg | ORAL_TABLET | Freq: Two times a day (BID) | ORAL | Status: DC
  Administered 2011-03-25: 5 mg via ORAL
  Filled 2011-03-24: qty 1

## 2011-03-24 MED ORDER — POTASSIUM CHLORIDE 10 MEQ PO TBCR
30.0000 meq | EXTENDED_RELEASE_TABLET | ORAL | Status: DC
  Filled 2011-03-24: qty 3

## 2011-03-24 MED ORDER — SODIUM CHLORIDE 0.9 % IJ SOLN
3.0000 mL | Freq: Two times a day (BID) | INTRAMUSCULAR | Status: DC
  Administered 2011-03-24: 3 mL via INTRAVENOUS

## 2011-03-24 MED ORDER — FOLIC ACID 1 MG PO TABS
1.0000 mg | ORAL_TABLET | ORAL | Status: DC
  Administered 2011-03-25: 1 mg via ORAL
  Filled 2011-03-24: qty 1

## 2011-03-24 MED ORDER — PANTOPRAZOLE SODIUM 40 MG PO TBEC
80.0000 mg | DELAYED_RELEASE_TABLET | Freq: Every day | ORAL | Status: DC
  Filled 2011-03-24: qty 2

## 2011-03-24 MED ORDER — SODIUM BICARBONATE 650 MG PO TABS
650.0000 mg | ORAL_TABLET | Freq: Two times a day (BID) | ORAL | Status: DC
  Administered 2011-03-25: 650 mg via ORAL
  Filled 2011-03-24: qty 1

## 2011-03-24 MED ORDER — SODIUM CHLORIDE 0.9 % IJ SOLN: 3.0000 mL | INTRAMUSCULAR | Status: DC | PRN

## 2011-03-24 MED ORDER — LACTULOSE 10 GM/15ML PO SOLN
40.0000 g | Freq: Two times a day (BID) | ORAL | Status: DC
  Administered 2011-03-25: 40 g via ORAL
  Filled 2011-03-24: qty 60

## 2011-03-24 MED ORDER — PANTOPRAZOLE SODIUM 40 MG IV SOLR
40.0000 mg | Freq: Two times a day (BID) | INTRAVENOUS | Status: DC
  Administered 2011-03-25: 40 mg via INTRAVENOUS
  Filled 2011-03-24: qty 40

## 2011-03-24 MED ORDER — ZINC SULFATE 220 (50 ZN) MG PO CAPS: 220.0000 mg | ORAL_CAPSULE | Freq: Every day | ORAL | Status: DC

## 2011-03-24 NOTE — H&P (Signed)
[name], Chan NO.:  [id number]  MEDICAL RECORD NO.:  [id number]  LOCATION:  [facility]06                         FACILITY:  [facility]  PHYSICIAN:  Tarry Kos, MD       DATE OF BIRTH:  1951-05-14  DATE OF ADMISSION:  03/24/2011 DATE OF DISCHARGE:  LH                             HISTORY & PHYSICAL   CHIEF COMPLAINT:  Weakness.  HISTORY OF PRESENT ILLNESS:  Timothy Chan is a 60 year old male who has a history of alcoholic cirrhosis, end-stage renal disease who is a dialysis dependent on Monday, Wednesday, Friday in [place], who comes in to the ED after being told by his Dialysis Unit that he needed a blood transfusion.  Apparently, he has been requiring a significant amount of transfusions over the last 6 months or so.  He says he had an EGD about 5 months ago, at which point he said that he had what sounds like varices in his stomach that were cauterized.  He was having a significant amount of melena at the end.  After he was cauterized with EGD, he says his melena stopped and he stopped having bleeding for several months and then over the past month, he started again having melanotic stools.  He has received at least 8 units of blood as an outpatient in his Dialysis Unit at [facility], and his hemoglobin was 6.7 at the Dialysis Unit today.  He is scheduled for an outpatient EGD on Friday with Dr. Tresa Res here at [facility], however, because he has progressively been getting weak, he finally came to the emergency room for further evaluation.  He denies any dizziness, denies any nausea, vomiting, abdominal pain.  Denies any diarrhea.  Denies any bright red blood per rectum.  REVIEW OF SYSTEMS:  Otherwise, negative.  He denies any fevers or cough.  MEDICATIONS:  Reviewed please see epic list  PAST MEDICAL HISTORY: 1. Alcoholic cirrhosis. 2. End-stage renal disease, dialysis dependent on Monday, Wednesday,     and Friday. 3. Hypertension. 4. Recent EGD showing  gastric antral vascular ectasia which was oozing at the time.  SOCIAL HISTORY:  He says he does not drink, however, his alcohol level is elevated here.  Denies any drugs.  Denies any smoking.  ALLERGIES:  None.  PHYSICAL EXAMINATION:  VITAL SIGNS:  His temperature is 97.3, pulse 73, respirations 18, blood pressure 102/65, 96% O2 sats on room air. GENERAL:  He is alert and oriented x4, in no apparent stress, cooperative and friendly and pale. HEENT:  Extraocular movements intact.  Pupils equal, round, and reactive to light.  Oropharynx clear.  Mucous membranes moist. NECK:  No JVD.  No carotid bruits. CARDIAC:  Regular rate and rhythm without murmurs, rubs, or gallops. CHEST:  Clear to auscultation bilaterally.  No wheeze, rhonchi, or rales. ABDOMEN:  Soft, nontender, nondistended.  Positive bowel sounds.  No hepatosplenomegaly. EXTREMITIES:  No clubbing, cyanosis, or edema. PSYCHIATRIC:  Normal affect. NEUROLOGIC:  No focal neurologic deficits. SKIN:  No rashes.  LABS:  His sodium is 138, potassium is 2.9, his creatinine is 2.8, his calcium is 7.8.  Hemoglobin is 6.  Chest x-ray reviewed by myself  shows no infiltrate.  Old records have been discovered.  He did have an EGD done which showed gastric antral vascular ectasia.  ASSESSMENT AND PLAN:  This is a 60 year old male with acute on chronic anemia, likely secondary to blood loss.  1. Upper gastrointestinal bleed with a history of gastric antral     vascular ectasia.  We will place the patient on telemetry floor,     will obtain a GI consultation, transfuse him 2 units of blood, keep     him n.p.o. overnight for possible EGD in the morning, hold off on     any anticoagulants at this point. 2. End-stage renal disease, dialysis dependent on Monday, Wednesday,     and Friday.  We will obtain a Nephrology consult. 3. History of alcoholic cirrhosis, stable.                                            ______________________________ Tarry Kos, MD     RD/MEDQ  D:  03/24/2011  T:  03/24/2011  Job:  [id number]

## 2011-03-24 NOTE — ED Provider Notes (Addendum)
History     CSN: [id number] Arrival date & time: 03/24/2011  4:55 PM  Chief Complaint  Patient presents with  . Headache   HPI level V caveat for urgent need for intervention area the patient has end-stage renal disease dialysis on Monday Wednesday and Friday. He was told by the dialysis center today that he was anemic and needed a transfusion. He is required 12 units of blood in the past month. See his local gastroenterologist for uncertain GI problem.  Dialysis performed in [facility]. Also has cirrhosis. Feels dizzy and has a headache.  Past Medical History  Diagnosis Date  . Rectal bleeding   . Dialysis care   . GI bleed   . Hypertension   . Esophageal varices   . Alcoholic liver disease   . Hepatic encephalopathy   . Ascites   . Diabetes mellitus     Past Surgical History  Procedure Date  . Esophagogastroduodenoscopy 12/31/2010    EGD APC THERAPY  . Esophagogastroduodenoscopy 05/31/2012E    GD APC ABLATION  . Small bowel givens 12/01/2010  . Colonoscopy 09/07/2010  . Esophagogastroduodenoscopy 09/05/2010    OUTLAW    History reviewed. No pertinent family history.  History  Substance Use Topics  . Smoking status: Former Smoker -- 0.5 packs/day  . Smokeless tobacco: Not on file  . Alcohol Use: No      Review of Systems  Unable to perform ROS: Other    Physical Exam  BP 117/60  Pulse 94  Temp(Src) 98.1 F (36.7 C) (Oral)  Resp 18  Ht 6\' 3"  (1.905 m)  Wt 224 lb (101.606 kg)  BMI 28.00 kg/m2  SpO2 98%  Physical Exam  Nursing note and vitals reviewed. Constitutional: He is oriented to person, place, and time. He appears well-developed and well-nourished.  HENT:  Head: Normocephalic and atraumatic.  Eyes: Conjunctivae and EOM are normal. Pupils are equal, round, and reactive to light.  Neck: Normal range of motion. Neck supple.  Cardiovascular: Normal rate and regular rhythm.   Pulmonary/Chest: Effort normal and breath sounds normal.    Abdominal: Soft. Bowel sounds are normal.  Musculoskeletal: Normal range of motion.  Neurological: He is alert and oriented to person, place, and time.  Skin: Skin is warm and dry.       Pale  Psychiatric: He has a normal mood and affect.    ED Course  Procedures Results for orders placed during the hospital encounter of 03/24/11  CBC      Component Value Range   WBC 4.5  4.0 - 10.5 (K/uL)   RBC 2.08 (*) 4.22 - 5.81 (MIL/uL)   Hemoglobin 6.0 (*) 13.0 - 17.0 (g/dL)   HCT 40.9 (*) 81.1 - 52.0 (%)   MCV 93.3  78.0 - 100.0 (fL)   MCH 28.8  26.0 - 34.0 (pg)   MCHC 30.9  30.0 - 36.0 (g/dL)   RDW 91.4 (*) 78.2 - 15.5 (%)   Platelets 178  150 - 400 (K/uL)  DIFFERENTIAL      Component Value Range   Neutrophils Relative 50  43 - 77 (%)   Neutro Abs 2.2  1.7 - 7.7 (K/uL)   Lymphocytes Relative 32  12 - 46 (%)   Lymphs Abs 1.4  0.7 - 4.0 (K/uL)   Monocytes Relative 14 (*) 3 - 12 (%)   Monocytes Absolute 0.6  0.1 - 1.0 (K/uL)   Eosinophils Relative 3  0 - 5 (%)   Eosinophils Absolute 0.1  0.0 - 0.7 (K/uL)   Basophils Relative 1  0 - 1 (%)   Basophils Absolute 0.0  0.0 - 0.1 (K/uL)  COMPREHENSIVE METABOLIC PANEL      Component Value Range   Sodium 138  135 - 145 (mEq/L)   Potassium 2.9 (*) 3.5 - 5.1 (mEq/L)   Chloride 98  96 - 112 (mEq/L)   CO2 34 (*) 19 - 32 (mEq/L)   Glucose, Bld 200 (*) 70 - 99 (mg/dL)   BUN 12  6 - 23 (mg/dL)   Creatinine, Ser 1.47 (*) 0.50 - 1.35 (mg/dL)   Calcium 7.8 (*) 8.4 - 10.5 (mg/dL)   Total Protein 5.4 (*) 6.0 - 8.3 (g/dL)   Albumin 1.8 (*) 3.5 - 5.2 (g/dL)   AST 18  0 - 37 (U/L)   ALT 11  0 - 53 (U/L)   Alkaline Phosphatase 115  39 - 117 (U/L)   Total Bilirubin 0.4  0.3 - 1.2 (mg/dL)   GFR calc non Af Amer 23 (*) >60 (mL/min)   GFR calc Af Amer 28 (*) >60 (mL/min)  TYPE AND SCREEN      Component Value Range   ABO/RH(D) B NEG     Antibody Screen NEG     Sample Expiration 03/27/2011     No results found.  MDM Patient has a host of health  problems.  hemoglobin 6.0. Will admit and transfuse. Discussed with hospitalist.      Donnetta Hutching, MD 03/24/11 1944  Donnetta Hutching, MD 03/24/11 1956  Donnetta Hutching, MD 03/24/11 1956

## 2011-03-24 NOTE — ED Notes (Signed)
Critical value hgb 6.0, hct 19.4 dr Adriana Simas notified

## 2011-03-24 NOTE — ED Notes (Signed)
Pt sent from dialysis for low blood count and pt states that he needs a transfusion. Pt states that his hemoglobin was 6.6. Pt finished his dialysis treatment today. C/o headache and dizziness. Pt also c/o pain in the back of his neck.

## 2011-03-24 NOTE — Progress Notes (Signed)
  [id number] 

## 2011-03-25 LAB — COMPREHENSIVE METABOLIC PANEL
ALT: 11 U/L (ref 0–53)
Alkaline Phosphatase: 105 U/L (ref 39–117)
CO2: 31 mEq/L (ref 19–32)
Chloride: 104 mEq/L (ref 96–112)
GFR calc Af Amer: 16 mL/min — ABNORMAL LOW (ref >60)
GFR calc non Af Amer: 14 mL/min — ABNORMAL LOW (ref >60)
Glucose, Bld: 194 mg/dL — ABNORMAL HIGH (ref 70–99)
Potassium: 3.3 mEq/L — ABNORMAL LOW (ref 3.5–5.1)
Sodium: 142 mEq/L (ref 135–145)
Total Bilirubin: 1.5 mg/dL — ABNORMAL HIGH (ref 0.3–1.2)

## 2011-03-25 LAB — CBC
MCHC: 32.8 g/dL (ref 30.0–36.0)
RDW: 17.1 % — ABNORMAL HIGH (ref 11.5–15.5)

## 2011-03-25 LAB — PROTIME-INR
INR: 1.19 (ref 0.00–1.49)
Prothrombin Time: 15.4 seconds — ABNORMAL HIGH (ref 11.6–15.2)

## 2011-03-25 MED ORDER — SODIUM CHLORIDE 0.45 % IV SOLN
Freq: Once | INTRAVENOUS | Status: AC
  Administered 2011-03-26: 20 mL/h via INTRAVENOUS

## 2011-03-25 MED ORDER — SODIUM CHLORIDE 0.9 % IJ SOLN
INTRAMUSCULAR | Status: AC
  Administered 2011-03-25: 02:00:00
  Filled 2011-03-25: qty 10

## 2011-03-25 MED ORDER — OXYCODONE HCL 5 MG PO TABS
2.5000 mg | ORAL_TABLET | Freq: Once | ORAL | Status: DC
  Filled 2011-03-25: qty 1

## 2011-03-25 NOTE — Discharge Summary (Signed)
Physician Discharge Summary  Patient ID: Timothy Chan MRN: [id number] DOB/AGE: 1951/03/31 60 y.o.  Admit date: 03/24/2011 Discharge date: 03/25/2011  Primary Care Physician:  Ignatius Specking., MD, MD   Discharge Diagnoses:   #1 anemia secondary to gastric vascular ectasia, status post 2 units of PRBCs. #2 melena secondary to gastric vascular ectasia, for EGD in the morning with Dr. Karilyn Cota. #3 end-stage renal disease on hemodialysis Monday Wednesday Friday. #4 alcoholic cirrhosis. #5 hypertension.    Current Discharge Medication List    CONTINUE these medications which have NOT CHANGED   Details  esomeprazole (NEXIUM) 40 MG capsule Take 40 mg by mouth daily before breakfast.      folic acid (FOLVITE) 1 MG tablet Take 1 mg by mouth every morning. At 6am    glipiZIDE (GLUCOTROL) 5 MG tablet Take 5 mg by mouth 2 (two) times daily before a meal. At 6 am and 5 pm     lactulose (CHRONULAC) 10 GM/15ML solution Take 60 mLs by mouth 2 (two) times daily.      lanthanum (FOSRENOL) 1000 MG chewable tablet Chew 500 mg by mouth 3 (three) times daily with meals. Chew one-half tablet with meals and with snacks    lidocaine-prilocaine (EMLA) cream Apply 1 application topically as needed. Rub on arm PRIOR TO DIALYSIS on Mondays, Wednesdays, and Fridays.    ondansetron (ZOFRAN) 4 MG tablet Take 4 mg by mouth every 12 (twelve) hours as needed. FOR NAUSEA    potassium chloride (K-DUR) 10 MEQ tablet Take 30 mEq by mouth 3 (three) times a week. TAKE 3 TABLETS ONCE A DAY THREE TIMES PER WEEK AFTER DIALYSIS on Mondays, Wednesdays, and Fridays.    sodium bicarbonate 650 MG tablet Take 650 mg by mouth 2 (two) times daily.     traMADol (ULTRAM) 50 MG tablet Take 50 mg by mouth every 4 (four) hours as needed. For PAIN    zinc gluconate 50 MG tablet Take 50 mg by mouth daily.        STOP taking these medications     glipiZIDE (GLUCOTROL XL) 5 MG 24 hr tablet      lidocaine (XYLOCAINE) 2 % jelly        rifaximin (XIFAXAN) 200 MG tablet      rifaximin (XIFAXAN) 550 MG TABS          Disposition and Follow-up: Patient is stable for discharge home today after 2 units of PRBC transfusion. Has a scheduled EGD in the morning with Dr. Karilyn Cota. We'll need to schedule a followup appointment with his primary care physician in 2 weeks.  Consults:  none    Significant Diagnostic Studies:  No results found.  Brief H and P: For complete details please refer to admission H and P, but in brief  Timothy Chan is a 60 year old white man with multiple medical comorbidities including end-stage renal disease on hemodialysis as well as melena secondary to gastric vascular ectasia with multiple PRBC transfusions over the past 4 weeks. He was sent to the hospital from his dialysis unit because he needed blood. He was found to have a hemoglobin of 6. He is asymptomatic. We were asked to admit him for further evaluation and management.    Hospital Course:  #1 anemia: This is secondary to acute blood loss anemia on top of anemia of chronic disease from his end-stage renal disease. He is known to have gastric vascular ectasia and in fact is due for a repeat EGD in the morning. He has  received approximately 15 units of PRBCs over the past 4 weeks.  Rest of chronic medical conditions are stable. His home medications have not been altered.   Time spent on Discharge:  greater than 30 minutes.   SignedChaya Jan 03/25/2011, 11:07 AM

## 2011-03-25 NOTE — Progress Notes (Signed)
D/c instructions reviewed with patient. Verbalized understanding. Pt dc'd to home with sister.  Schonewitz, Candelaria Stagers  03/25/2011  1125

## 2011-03-26 ENCOUNTER — Encounter (HOSPITAL_COMMUNITY): Payer: Self-pay | Admitting: *Deleted

## 2011-03-26 ENCOUNTER — Encounter (HOSPITAL_COMMUNITY)
Admission: RE | Disposition: A | Discharge status: Home / Self Care | Payer: Self-pay | Source: Ambulatory Visit | Attending: Internal Medicine

## 2011-03-26 ENCOUNTER — Ambulatory Visit (HOSPITAL_COMMUNITY)
Admission: RE | Admit: 2011-03-26 | Discharge: 2011-03-26 | Disposition: A | Discharge status: Home / Self Care | Payer: Medicare Other | Source: Ambulatory Visit | Attending: Internal Medicine | Admitting: Internal Medicine

## 2011-03-26 DIAGNOSIS — K319 Disease of stomach and duodenum, unspecified: Secondary | ICD-10-CM

## 2011-03-26 DIAGNOSIS — Z992 Dependence on renal dialysis: Secondary | ICD-10-CM | POA: Insufficient documentation

## 2011-03-26 DIAGNOSIS — I10 Essential (primary) hypertension: Secondary | ICD-10-CM | POA: Insufficient documentation

## 2011-03-26 DIAGNOSIS — K7030 Alcoholic cirrhosis of liver without ascites: Secondary | ICD-10-CM | POA: Insufficient documentation

## 2011-03-26 DIAGNOSIS — K31811 Angiodysplasia of stomach and duodenum with bleeding: Secondary | ICD-10-CM

## 2011-03-26 DIAGNOSIS — N186 End stage renal disease: Secondary | ICD-10-CM | POA: Insufficient documentation

## 2011-03-26 DIAGNOSIS — Z79899 Other long term (current) drug therapy: Secondary | ICD-10-CM | POA: Insufficient documentation

## 2011-03-26 DIAGNOSIS — E119 Type 2 diabetes mellitus without complications: Secondary | ICD-10-CM | POA: Insufficient documentation

## 2011-03-26 DIAGNOSIS — K449 Diaphragmatic hernia without obstruction or gangrene: Secondary | ICD-10-CM

## 2011-03-26 DIAGNOSIS — K922 Gastrointestinal hemorrhage, unspecified: Secondary | ICD-10-CM

## 2011-03-26 DIAGNOSIS — K7689 Other specified diseases of liver: Secondary | ICD-10-CM

## 2011-03-26 DIAGNOSIS — F1020 Alcohol dependence, uncomplicated: Secondary | ICD-10-CM | POA: Insufficient documentation

## 2011-03-26 HISTORY — PX: ESOPHAGOGASTRODUODENOSCOPY: SHX5428

## 2011-03-26 HISTORY — PX: HOT HEMOSTASIS: SHX5433

## 2011-03-26 SURGERY — EGD (ESOPHAGOGASTRODUODENOSCOPY)
Anesthesia: Moderate Sedation

## 2011-03-26 MED ORDER — MIDAZOLAM HCL 5 MG/5ML IJ SOLN
INTRAMUSCULAR | Status: DC | PRN
  Administered 2011-03-26 (×3): 2 mg via INTRAVENOUS

## 2011-03-26 MED ORDER — MEPERIDINE HCL 50 MG/ML IJ SOLN
INTRAMUSCULAR | Status: AC
  Filled 2011-03-26: qty 1

## 2011-03-26 MED ORDER — MIDAZOLAM HCL 5 MG/5ML IJ SOLN
INTRAMUSCULAR | Status: AC
  Filled 2011-03-26: qty 10

## 2011-03-26 MED ORDER — MEPERIDINE HCL 25 MG/ML IJ SOLN
INTRAMUSCULAR | Status: DC | PRN
  Administered 2011-03-26 (×2): 25 mg via INTRAVENOUS

## 2011-03-26 MED ORDER — BUTAMBEN-TETRACAINE-BENZOCAINE 2-2-14 % EX AERO
INHALATION_SPRAY | CUTANEOUS | Status: DC | PRN
  Administered 2011-03-26: 2 via TOPICAL

## 2011-03-26 NOTE — Op Note (Signed)
EGD PROCEDURE REPORT  PATIENT:  Timothy Chan  MR#:  [id number] Birthdate:  1950/09/15, 60 y.o., male Endoscopist:  Dr. Malissa Hippo, MD Referred By:  Hinda Lenis, MD Procedure Date: 03/26/2011  Procedure:   EGD with APC of GAVE.  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 He is undergoing APC therapy hoping to decrease the need for blood transfusion.            Informed Consent: Procedure and risks were reviewed with the patient and informed consent was obtained.  Medications:  Demerol 50 mg IV Versed 6 mg IV Cetacaine spray topically for oropharyngeal anesthesia  Description of procedure:  The endoscope was introduced through the mouth and advanced to the second portion of the duodenum without difficulty or limitations. The mucosal surfaces were surveyed very carefully during advancement of the scope and upon withdrawal.  Findings:  Esophagus:  Normal mucosa without esophageal varices. GEJ:  39 cm Hiatus:  41 cm Stomach:  There is edema to mucosa at gastric body. Numerous antral vascular ectasia noted in 3 of these were oozing. The ones that were bleeding were treated along with many more with argon plasma coagulator. No fundal varices identified. Pyloric channel is patent. Duodenum:  Normal bulbar and post bulbar mucosa.  Therapeutic/Diagnostic Maneuvers Performed:  Multiple telangiectasia and antrum were ablated with argon plasma coagulator.  Complications:  None  Impression: No evidence of esophageal or gastric varices. 2 cm size sliding-type hernia. Multiple gastric vascular antral ectasia. 3 of these lesions were oozing were treated with argon coagulator. Multiple other lesions are also treated he still had many more.  Recommendations:  Continue Nexium as before. No aspirin or other OTC NSAIDs. Next session in 3 weeks.  Timothy Chan,Timothy Chan  03/26/2011  9:25  AM  CC: Dr. Ignatius Specking, MD

## 2011-03-26 NOTE — H&P (Signed)
Timothy Chan is an 60 y.o. male.   Chief Complaint: Patient is here for EGD and APC of gastric antral vascular ectasia. 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 documented to be secondary to gastric antral vascular ectasia. He underwent EGD with APC in May and again in June 2012. He is having recurrent melena and need for blood transfusion. He is therefore undergoing repeat EGD with APC. Patient has been evaluated for liver transplant at [facility] in [facility]. His disease has been complicated by end-stage renal disease and he is on hemodialysis He unfortunately has been taken off the transplant list. He is still being followed at [facility]. He does not take any NSAIDs.  Past Medical History  Diagnosis Date  . Rectal bleeding   . Dialysis care   . GI bleed   . Hypertension   . Esophageal varices   . Alcoholic liver disease   . Hepatic encephalopathy   . Ascites   . Diabetes mellitus     Past Surgical History  Procedure Date  . Esophagogastroduodenoscopy 12/31/2010    EGD APC THERAPY  . Esophagogastroduodenoscopy 05/31/2012E    GD APC ABLATION  . Small bowel givens 12/01/2010  . Colonoscopy 09/07/2010  . Esophagogastroduodenoscopy 09/05/2010    OUTLAW  . Lung surgery 6/11    [place]    History reviewed. No pertinent family history. Social History:  reports that he has quit smoking. He does not have any smokeless tobacco history on file. He reports that he does not drink alcohol or use illicit drugs.  Allergies:  Allergies  Allergen Reactions  . Tylenol (Acetaminophen) Other (See Comments)    cirrhosis  . Morphine And Related Itching    Medications Prior to Admission  Medication Dose Route Frequency Provider Last Rate Last Dose  . 0.45 % sodium chloride infusion   Intravenous Once Malissa Hippo, MD 20 mL/hr at 03/26/11 0833 20 mL/hr at 03/26/11 0833  . meperidine (DEMEROL) 50  MG/ML injection           . midazolam (VERSED) 5 MG/5ML injection            Medications Prior to Admission  Medication Sig Dispense Refill  . esomeprazole (NEXIUM) 40 MG capsule Take 40 mg by mouth daily before breakfast.        . folic acid (FOLVITE) 1 MG tablet Take 1 mg by mouth every morning. At 6am      . lactulose (CHRONULAC) 10 GM/15ML solution Take 60 mLs by mouth 2 (two) times daily.        [name] lanthanum (FOSRENOL) 1000 MG chewable tablet Chew 500 mg by mouth 3 (three) times daily with meals. Chew one-half tablet with meals and with snacks      . ondansetron (ZOFRAN) 4 MG tablet Take 4 mg by mouth every 12 (twelve) hours as needed. FOR NAUSEA      . potassium chloride (K-DUR) 10 MEQ tablet Take 30 mEq by mouth 3 (three) times a week. TAKE 3 TABLETS ONCE A DAY THREE TIMES PER WEEK AFTER DIALYSIS on Mondays, Wednesdays, and Fridays.      . sodium bicarbonate 650 MG tablet Take 650 mg by mouth 2 (two) times daily.       . traMADol (ULTRAM) 50 MG tablet Take 50 mg by mouth every 4 (four) hours as needed. For PAIN      . zinc gluconate 50 MG tablet Take  50 mg by mouth daily.        [name] lidocaine-prilocaine (EMLA) cream Apply 1 application topically as needed. Rub on arm PRIOR TO DIALYSIS on Mondays, Wednesdays, and Fridays.        Results for orders placed during the hospital encounter of 03/24/11 (from the past 48 hour(s))  CBC     Status: Abnormal   Collection Time   03/24/11  6:10 PM      Component Value Range Comment   WBC 4.5  4.0 - 10.5 (K/uL)    RBC 2.08 (*) 4.22 - 5.81 (MIL/uL)    Hemoglobin 6.0 (*) 13.0 - 17.0 (g/dL)    HCT 16.1 (*) 09.6 - 52.0 (%)    MCV 93.3  78.0 - 100.0 (fL)    MCH 28.8  26.0 - 34.0 (pg)    MCHC 30.9  30.0 - 36.0 (g/dL)    RDW 04.5 (*) 40.9 - 15.5 (%)    Platelets 178  150 - 400 (K/uL)   DIFFERENTIAL     Status: Abnormal   Collection Time   03/24/11  6:10 PM      Component Value Range Comment   Neutrophils Relative 50  43 - 77 (%)    Neutro Abs 2.2   1.7 - 7.7 (K/uL)    Lymphocytes Relative 32  12 - 46 (%)    Lymphs Abs 1.4  0.7 - 4.0 (K/uL)    Monocytes Relative 14 (*) 3 - 12 (%)    Monocytes Absolute 0.6  0.1 - 1.0 (K/uL)    Eosinophils Relative 3  0 - 5 (%)    Eosinophils Absolute 0.1  0.0 - 0.7 (K/uL)    Basophils Relative 1  0 - 1 (%)    Basophils Absolute 0.0  0.0 - 0.1 (K/uL)   COMPREHENSIVE METABOLIC PANEL     Status: Abnormal   Collection Time   03/24/11  6:10 PM      Component Value Range Comment   Sodium 138  135 - 145 (mEq/L)    Potassium 2.9 (*) 3.5 - 5.1 (mEq/L)    Chloride 98  96 - 112 (mEq/L)    CO2 34 (*) 19 - 32 (mEq/L)    Glucose, Bld 200 (*) 70 - 99 (mg/dL)    BUN 12  6 - 23 (mg/dL)    Creatinine, Ser 8.11 (*) 0.50 - 1.35 (mg/dL)    Calcium 7.8 (*) 8.4 - 10.5 (mg/dL)    Total Protein 5.4 (*) 6.0 - 8.3 (g/dL)    Albumin 1.8 (*) 3.5 - 5.2 (g/dL)    AST 18  0 - 37 (U/L)    ALT 11  0 - 53 (U/L)    Alkaline Phosphatase 115  39 - 117 (U/L)    Total Bilirubin 0.4  0.3 - 1.2 (mg/dL)    GFR calc non Af Amer 23 (*) >60 (mL/min)    GFR calc Af Amer 28 (*) >60 (mL/min)   TYPE AND SCREEN     Status: Normal   Collection Time   03/24/11  6:10 PM      Component Value Range Comment   ABO/RH(D) B NEG      Antibody Screen NEG      Sample Expiration 03/27/2011      Unit Number [id number]      Blood Component Type RED CELLS,LR      Unit division 00      Status of Unit ISSUED,FINAL      Transfusion Status OK  TO TRANSFUSE      Crossmatch Result Compatible      Unit Number [id number]      Blood Component Type RED CELLS,LR      Unit division 00      Status of Unit ISSUED,FINAL      Transfusion Status OK TO TRANSFUSE      Crossmatch Result Compatible     PREPARE RBC (CROSSMATCH)     Status: Normal   Collection Time   03/24/11  6:10 PM      Component Value Range Comment   Order Confirmation ORDER PROCESSED BY BLOOD BANK     COMPREHENSIVE METABOLIC PANEL     Status: Abnormal   Collection Time   03/25/11  7:39 AM       Component Value Range Comment   Sodium 142  135 - 145 (mEq/L)    Potassium 3.3 (*) 3.5 - 5.1 (mEq/L)    Chloride 104  96 - 112 (mEq/L)    CO2 31  19 - 32 (mEq/L)    Glucose, Bld 194 (*) 70 - 99 (mg/dL)    BUN 18  6 - 23 (mg/dL)    Creatinine, Ser 5.40 (*) 0.50 - 1.35 (mg/dL) DELTA CHECK NOTED   Calcium 7.8 (*) 8.4 - 10.5 (mg/dL)    Total Protein 5.0 (*) 6.0 - 8.3 (g/dL)    Albumin 2.0 (*) 3.5 - 5.2 (g/dL)    AST 22  0 - 37 (U/L)    ALT 11  0 - 53 (U/L)    Alkaline Phosphatase 105  39 - 117 (U/L)    Total Bilirubin 1.5 (*) 0.3 - 1.2 (mg/dL)    GFR calc non Af Amer 14 (*) >60 (mL/min)    GFR calc Af Amer 16 (*) >60 (mL/min)   CBC     Status: Abnormal   Collection Time   03/25/11  7:39 AM      Component Value Range Comment   WBC 5.1  4.0 - 10.5 (K/uL)    RBC 2.66 (*) 4.22 - 5.81 (MIL/uL)    Hemoglobin 7.9 (*) 13.0 - 17.0 (g/dL) DELTA CHECK NOTED   HCT 24.1 (*) 39.0 - 52.0 (%)    MCV 90.6  78.0 - 100.0 (fL)    MCH 29.7  26.0 - 34.0 (pg)    MCHC 32.8  30.0 - 36.0 (g/dL)    RDW 98.1 (*) 19.1 - 15.5 (%)    Platelets 135 (*) 150 - 400 (K/uL) DELTA CHECK NOTED  PROTIME-INR     Status: Abnormal   Collection Time   03/25/11  7:39 AM      Component Value Range Comment   Prothrombin Time 15.4 (*) 11.6 - 15.2 (seconds)    INR 1.19  0.00 - 1.49    APTT     Status: Normal   Collection Time   03/25/11  7:39 AM      Component Value Range Comment   aPTT 32  24 - 37 (seconds)   CARDIAC PANEL(CRET KIN+CKTOT+MB+TROPI)     Status: Normal   Collection Time   03/25/11  7:40 AM      Component Value Range Comment   Total CK 41  7 - 232 (U/L)    CK, MB 2.5  0.3 - 4.0 (ng/mL)    Troponin I <0.30  <0.30 (ng/mL)    Relative Index RELATIVE INDEX IS INVALID  0.0 - 2.5     No results found.  Review of Systems  Constitutional: Negative for  weight loss.  Gastrointestinal: Negative for heartburn, nausea, vomiting, diarrhea, constipation and blood in stool. Abdominal pain: intermittent pain in right  lower quadrant. Melena: recurrent melena.    Blood pressure 135/73, pulse 86, temperature 98 F (36.7 C), temperature source Oral, resp. rate 16, height 6\' 3"  (1.905 m), weight 224 lb (101.606 kg), SpO2 100.00%. Physical Exam  Constitutional: He is oriented to person, place, and time. He appears well-developed and well-nourished.  HENT:  Head: Normocephalic.  Mouth/Throat: Oropharynx is clear and moist.  Eyes: Conjunctivae are normal. Pupils are equal, round, and reactive to light. No scleral icterus.  Neck: No thyromegaly present.  Cardiovascular: Normal rate, regular rhythm and normal heart sounds.   No murmur heard. GI: Soft. He exhibits no mass. There is no tenderness.  Musculoskeletal: Edema: race LE edema.  Lymphadenopathy:    He has no cervical adenopathy.  Neurological: He is alert and oriented to person, place, and time.       No asterixis  Skin: Skin is warm and dry.     Assessment/Plan Recurrent GI bleed secondary to gastric antral vascular ectasia with need for frequent transfusion with PRBCs. EGD with APC. This therapy if it works will only be temporary and what he needs is a not only a new liver but also a renal transplant. He has an appointment at [facility] in one week  Zenia Guest U 03/26/2011, 8:55 AM

## 2011-03-27 LAB — TYPE AND SCREEN
Antibody Screen: NEGATIVE
Unit division: 0

## 2011-03-29 ENCOUNTER — Telehealth (INDEPENDENT_AMBULATORY_CARE_PROVIDER_SITE_OTHER): Payer: Self-pay | Admitting: *Deleted

## 2011-03-29 NOTE — Telephone Encounter (Signed)
He Timothy Chan) was unable to attend his procedure Friday, would like for you to call him to bring him up to date on what is going on.

## 2011-04-01 ENCOUNTER — Encounter (HOSPITAL_COMMUNITY): Payer: Self-pay | Admitting: Internal Medicine

## 2011-04-04 NOTE — Telephone Encounter (Signed)
I talk with Timothy Chan earlier this week about his stats condition. His dad has very poor prognosis on this is likely enough to get liver and renal transplants. Timothy Chan has an office visit at [facility] in near future

## 2011-04-05 NOTE — Progress Notes (Signed)
Encounter addended by: Clarene Critchley on: 04/05/2011  3:07 PM<BR>     Documentation filed: Flowsheet VN

## 2011-04-23 LAB — DIFFERENTIAL
Basophils Absolute: 0.1
Basophils Absolute: 0.1
Eosinophils Absolute: 0.3
Eosinophils Absolute: 0.3
Eosinophils Relative: 3
Eosinophils Relative: 5
Lymphocytes Relative: 19
Lymphs Abs: 1.7
Lymphs Abs: 2.1
Monocytes Absolute: 0.9 — ABNORMAL HIGH
Monocytes Relative: 11
Neutro Abs: 5.8
Neutrophils Relative %: 62
Neutrophils Relative %: 65

## 2011-04-23 LAB — IRON AND TIBC
Saturation Ratios: 18 — ABNORMAL LOW
TIBC: 217
UIBC: 177

## 2011-04-23 LAB — APTT: aPTT: 33

## 2011-04-23 LAB — COMPREHENSIVE METABOLIC PANEL
AST: 29
BUN: 24 — ABNORMAL HIGH
CO2: 24
Calcium: 9
Chloride: 114 — ABNORMAL HIGH
Creatinine, Ser: 2.25 — ABNORMAL HIGH
GFR calc Af Amer: 37 — ABNORMAL LOW
GFR calc non Af Amer: 30 — ABNORMAL LOW
Total Bilirubin: 1.1

## 2011-04-23 LAB — BASIC METABOLIC PANEL
BUN: 26 — ABNORMAL HIGH
BUN: 29 — ABNORMAL HIGH
Chloride: 111
Creatinine, Ser: 2.36 — ABNORMAL HIGH
Creatinine, Ser: 2.77 — ABNORMAL HIGH
GFR calc non Af Amer: 24 — ABNORMAL LOW
GFR calc non Af Amer: 29 — ABNORMAL LOW
Glucose, Bld: 140 — ABNORMAL HIGH
Potassium: 3.9

## 2011-04-23 LAB — HEMOGLOBIN A1C
Hgb A1c MFr Bld: 5.6
Mean Plasma Glucose: 122

## 2011-04-23 LAB — FERRITIN: Ferritin: 418 — ABNORMAL HIGH (ref 22–322)

## 2011-04-23 LAB — CBC
HCT: 29.4 — ABNORMAL LOW
HCT: 31.8 — ABNORMAL LOW
MCHC: 34.4
MCV: 93.7
MCV: 94.1
MCV: 94.1
Platelets: 228
Platelets: 247
RBC: 3.38 — ABNORMAL LOW
RDW: 13.5
WBC: 7.7
WBC: 8.9

## 2011-04-23 LAB — BLOOD GAS, ARTERIAL
Bicarbonate: 21.9
Patient temperature: 37
TCO2: 19.6
pCO2 arterial: 32.8 — ABNORMAL LOW
pH, Arterial: 7.439
pO2, Arterial: 94.7

## 2011-04-23 LAB — POCT CARDIAC MARKERS
Operator id: 106841
Troponin i, poc: <0.05

## 2011-04-23 LAB — PROTIME-INR
INR: 1.1
Prothrombin Time: 14.1

## 2011-04-23 LAB — CARDIAC PANEL(CRET KIN+CKTOT+MB+TROPI)
Relative Index: INVALID
Total CK: 62
Troponin I: 0.02
Troponin I: 0.04

## 2011-04-23 LAB — B-NATRIURETIC PEPTIDE (CONVERTED LAB): Pro B Natriuretic peptide (BNP): 52.1

## 2011-04-23 LAB — HEPATIC FUNCTION PANEL
Indirect Bilirubin: 0.7
Total Protein: 6.1

## 2011-04-23 LAB — LACTATE DEHYDROGENASE: LDH: 221

## 2011-04-23 LAB — FOLATE RBC: RBC Folate: 1286 — ABNORMAL HIGH

## 2011-04-27 ENCOUNTER — Ambulatory Visit (INDEPENDENT_AMBULATORY_CARE_PROVIDER_SITE_OTHER): Payer: Medicare Other | Admitting: Internal Medicine

## 2011-05-04 ENCOUNTER — Telehealth (INDEPENDENT_AMBULATORY_CARE_PROVIDER_SITE_OTHER): Payer: Self-pay | Admitting: *Deleted

## 2011-05-04 NOTE — Telephone Encounter (Signed)
Timothy Chan needs a letter stating that he needs a letter from each of his doctors stating that he needs someone to take care of him.  He is trying to get his wife back her from [place].

## 2011-05-25 ENCOUNTER — Ambulatory Visit: Payer: Medicare Other | Admitting: Physical Therapy

## 2011-05-26 ENCOUNTER — Ambulatory Visit: Payer: Medicare Other | Attending: Internal Medicine | Admitting: Physical Therapy

## 2011-05-26 DIAGNOSIS — IMO0001 Reserved for inherently not codable concepts without codable children: Secondary | ICD-10-CM | POA: Insufficient documentation

## 2011-05-26 DIAGNOSIS — R5381 Other malaise: Secondary | ICD-10-CM | POA: Insufficient documentation

## 2011-05-26 DIAGNOSIS — M6281 Muscle weakness (generalized): Secondary | ICD-10-CM | POA: Insufficient documentation

## 2011-05-26 DIAGNOSIS — R262 Difficulty in walking, not elsewhere classified: Secondary | ICD-10-CM | POA: Insufficient documentation

## 2011-05-27 ENCOUNTER — Ambulatory Visit: Payer: Medicare Other | Admitting: Physical Therapy

## 2011-06-01 ENCOUNTER — Ambulatory Visit: Payer: Medicare Other | Admitting: Physical Therapy

## 2011-06-02 ENCOUNTER — Ambulatory Visit: Payer: Medicare Other | Admitting: Physical Therapy

## 2011-06-07 ENCOUNTER — Ambulatory Visit: Payer: Medicare Other | Admitting: Physical Therapy

## 2011-06-09 ENCOUNTER — Ambulatory Visit: Payer: Medicare Other | Admitting: Physical Therapy

## 2011-06-11 ENCOUNTER — Ambulatory Visit: Payer: Medicare Other | Admitting: Physical Therapy

## 2011-06-14 ENCOUNTER — Encounter: Payer: Medicare Other | Admitting: Physical Therapy

## 2011-06-16 ENCOUNTER — Ambulatory Visit: Payer: Medicare Other | Attending: Internal Medicine | Admitting: Physical Therapy

## 2011-06-16 DIAGNOSIS — M6281 Muscle weakness (generalized): Secondary | ICD-10-CM | POA: Insufficient documentation

## 2011-06-16 DIAGNOSIS — R5381 Other malaise: Secondary | ICD-10-CM | POA: Insufficient documentation

## 2011-06-16 DIAGNOSIS — R262 Difficulty in walking, not elsewhere classified: Secondary | ICD-10-CM | POA: Insufficient documentation

## 2011-06-16 DIAGNOSIS — IMO0001 Reserved for inherently not codable concepts without codable children: Secondary | ICD-10-CM | POA: Insufficient documentation

## 2011-06-18 ENCOUNTER — Ambulatory Visit: Payer: Medicare Other | Admitting: Physical Therapy

## 2011-06-21 ENCOUNTER — Ambulatory Visit: Payer: Medicare Other | Admitting: Physical Therapy

## 2011-06-23 ENCOUNTER — Ambulatory Visit: Payer: Medicare Other | Admitting: Physical Therapy

## 2011-06-25 ENCOUNTER — Ambulatory Visit: Payer: Medicare Other | Admitting: Physical Therapy

## 2011-06-28 ENCOUNTER — Ambulatory Visit: Payer: Medicare Other | Admitting: Physical Therapy

## 2011-06-30 ENCOUNTER — Ambulatory Visit: Payer: Medicare Other | Admitting: Physical Therapy

## 2011-06-30 ENCOUNTER — Telehealth (INDEPENDENT_AMBULATORY_CARE_PROVIDER_SITE_OTHER): Payer: Self-pay | Admitting: *Deleted

## 2011-06-30 NOTE — Telephone Encounter (Signed)
Would like to see if Dr. Karilyn Cota would please refer him to Dr. Doretha Sou at [place], [phone]. This will cut his drive in 1/2 compared to going to [facility] where he see Dr. Chilton Si now, [phone].  Timothy Chan's return phone number is [phone]. He would also like to know if we have an samples of Xifaxan?

## 2011-06-30 NOTE — Telephone Encounter (Signed)
I called the patient at the number provided. I left a message that we had samples of the Xifaxan, and he could come by and pick them up . I also told him that I would make Dr. Karilyn Cota aware of his request to go to [facility] instead of [place]. Once addressed we would make him ware.

## 2011-06-30 NOTE — Telephone Encounter (Signed)
We can make an appointment for him to go [facility]

## 2011-07-02 ENCOUNTER — Ambulatory Visit: Payer: Medicare Other | Admitting: Physical Therapy

## 2011-07-08 NOTE — Telephone Encounter (Signed)
Referral & Notes faxed to [facility], they will contact patient with appt

## 2011-07-12 ENCOUNTER — Encounter: Payer: Medicare Other | Admitting: Physical Therapy

## 2011-07-14 ENCOUNTER — Encounter: Payer: Medicare Other | Admitting: Physical Therapy

## 2011-12-09 ENCOUNTER — Encounter (INDEPENDENT_AMBULATORY_CARE_PROVIDER_SITE_OTHER): Payer: Self-pay

## 2012-01-21 ENCOUNTER — Telehealth (INDEPENDENT_AMBULATORY_CARE_PROVIDER_SITE_OTHER): Payer: Self-pay | Admitting: *Deleted

## 2012-01-21 NOTE — Telephone Encounter (Signed)
Needs samples of Xifaxan please call [phone]

## 2012-01-24 NOTE — Telephone Encounter (Signed)
Samples left at front desk and labeled

## 2012-01-24 NOTE — Telephone Encounter (Signed)
Samples left at front desk and labeled 

## 2012-02-17 ENCOUNTER — Telehealth (INDEPENDENT_AMBULATORY_CARE_PROVIDER_SITE_OTHER): Payer: Self-pay | Admitting: *Deleted

## 2012-02-17 NOTE — Telephone Encounter (Signed)
Timothy Chan called on 02/16/12 and ask if Dr.Rehman would make a referral to a doctor for him for his lower back and hip pain. I told him that his PCP should do this. His reply was that he had ask them and they insisted that it had to be done in [place] , TimothyDavid does not want to go there for this problem. He was told that Dr.Rehman would be made aware and once it was addressed we would call him .

## 2012-02-17 NOTE — Telephone Encounter (Signed)
He needs to have PCP do this.

## 2012-02-17 NOTE — Telephone Encounter (Signed)
Patient was called and made aware of Dr.Rehman's recommendation. 

## 2012-04-10 ENCOUNTER — Encounter (INDEPENDENT_AMBULATORY_CARE_PROVIDER_SITE_OTHER): Payer: Self-pay | Admitting: Internal Medicine

## 2012-04-10 ENCOUNTER — Ambulatory Visit (INDEPENDENT_AMBULATORY_CARE_PROVIDER_SITE_OTHER): Payer: Medicare Other | Admitting: Internal Medicine

## 2012-04-10 VITALS — BP 150/84 | HR 88 | Temp 97.7°F | Resp 16 | Ht 75.0 in | Wt 229.4 lb

## 2012-04-10 DIAGNOSIS — H3321 Serous retinal detachment, right eye: Secondary | ICD-10-CM | POA: Insufficient documentation

## 2012-04-10 DIAGNOSIS — G629 Polyneuropathy, unspecified: Secondary | ICD-10-CM | POA: Insufficient documentation

## 2012-04-10 DIAGNOSIS — N186 End stage renal disease: Secondary | ICD-10-CM | POA: Insufficient documentation

## 2012-04-10 DIAGNOSIS — K7030 Alcoholic cirrhosis of liver without ascites: Secondary | ICD-10-CM

## 2012-04-10 DIAGNOSIS — E1165 Type 2 diabetes mellitus with hyperglycemia: Secondary | ICD-10-CM | POA: Insufficient documentation

## 2012-04-10 DIAGNOSIS — K7290 Hepatic failure, unspecified without coma: Secondary | ICD-10-CM

## 2012-04-10 MED ORDER — RIFAXIMIN 550 MG PO TABS: 550.0000 mg | ORAL_TABLET | Freq: Two times a day (BID) | ORAL | Status: DC

## 2012-04-10 MED ORDER — LACTULOSE 10 GM/15ML PO SOLN: 10.0000 g | Freq: Three times a day (TID) | ORAL | Status: DC

## 2012-04-10 NOTE — Progress Notes (Signed)
Presenting complaint;  Followup for chronic liver disease, hepatic encephalopathy and anemia.  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 He was last seen in September 2012 when he presented with anemia and GI bleed and underwent EGD with APC of gastric antral vascular ectasia. He states he has not required any more blood transfusion. He is now seeing Dr. Pollyann Samples of [facility] and has been evaluated for liver and renal transplant. His last visit was in March 2013 and he is supposed to be seeing him later this year. He has good appetite. He is still driving but only in [place]. He lives alone but he has plenty of support from his family members and friends. He denies recent change in his weight or confusion. He has not taken lactulose at at the recommended dose because he became incontinent. He has mild back pain. From his description it appears he has had vertebroplasty. He also complains of numbness in his lower extremities. He was given Tegretol by Dr. Ophelia Charter but it made him drowsy and he stopped this medication. He denies melena or rectal bleeding. He had surgery on his right eye for detached retina on 03/22/2012. He states his vision is slowly coming back. He quit cigarette smoking in February 2013. He has not drank alcohol in the last 12 years. He has been on dialysis for 3-1/2 years.  Current Medications: Current Outpatient Prescriptions  Medication Sig Dispense Refill  . esomeprazole (NEXIUM) 40 MG capsule Take 40 mg by mouth daily before breakfast.        . glipiZIDE (GLUCOTROL) 5 MG tablet Take 5 mg by mouth 2 (two) times daily before a meal. At 6 am and 5 pm       . lanthanum (FOSRENOL) 1000 MG chewable tablet Chew 500 mg by mouth 3 (three) times daily with meals. Chew one-half tablet with meals and with snacks      . lidocaine-prilocaine (EMLA) cream Apply 1 application topically as needed. Rub on arm PRIOR TO  DIALYSIS on Mondays, Wednesdays, and Fridays.      . ondansetron (ZOFRAN) 4 MG tablet Take 4 mg by mouth every 12 (twelve) hours as needed. FOR NAUSEA      . potassium chloride (K-DUR) 10 MEQ tablet Take 30 mEq by mouth 3 (three) times a week. TAKE 3 TABLETS ONCE A DAY THREE TIMES PER WEEK AFTER DIALYSIS on Mondays, Wednesdays, and Fridays.      . rifaximin (XIFAXAN) 550 MG TABS Take 550 mg by mouth. Patient takes 1/2 pill in the morning and 1/2 pill at night      . traMADol (ULTRAM) 50 MG tablet Take 50 mg by mouth every 4 (four) hours as needed. For PAIN         Objective: Blood pressure 150/84, pulse 88, temperature 97.7 F (36.5 C), temperature source Oral, resp. rate 16, height 6\' 3"  (1.905 m), weight 229 lb 6.4 oz (104.055 kg). Patient is alert and does not have asterixis. Conjunctiva is pink. Sclera is nonicteric Oropharyngeal mucosa is normal. No neck masses or thyromegaly noted. Cardiac exam with regular rhythm normal S1 and S2.  Lungs are clear to auscultation. Abdomen is protuberant. Shifting dullness is absent. Spleen is palpable. Liver edge is indistinct. No LE edema or clubbing noted.  Lab data; Ultrasound from 10/06/2011 done at [facility]. Small to moderate right pleural effusion small left lower effusion stable cardiomegaly and TIPS in place.    Assessment:  #1. Alcoholic  cirrhosis complicated by hepatic encephalopathy ascites. He has had TIPS few years ago. Clinically he does not have ascites. He does not appear to be encephalopathic but he needs to be on lactulose in addition to Xifaxan. He is waiting for liver and kidney transplant. #2. History of anemia secondary GI bleed from gastric antral vascular ectasia. Last APC one year ago.   Plan: Patient advised to have regular daily contact via phone with family and friends so that we know he is doing fine. Lactulose 10-20 g by mouth twice a day new prescription given. Continue Xifaxan at 550 mg by mouth twice a day.  Samples given. Alpha-fetoprotein with his next blood work to be done at dialysis center. Office visit in 3 months.

## 2012-04-10 NOTE — Patient Instructions (Addendum)
Please have dialysis center fax your next blood work report to our office. Please have dialysis center do alpha-fetoprotein with your next blood draw(paperwork provided).

## 2012-04-24 ENCOUNTER — Encounter (HOSPITAL_COMMUNITY): Payer: Self-pay

## 2012-04-24 ENCOUNTER — Emergency Department (HOSPITAL_COMMUNITY)
Admission: EM | Admit: 2012-04-24 | Discharge: 2012-04-24 | Disposition: A | Discharge status: Home / Self Care | Payer: Medicare Other | Attending: Emergency Medicine | Admitting: Emergency Medicine

## 2012-04-24 DIAGNOSIS — I120 Hypertensive chronic kidney disease with stage 5 chronic kidney disease or end stage renal disease: Secondary | ICD-10-CM | POA: Insufficient documentation

## 2012-04-24 DIAGNOSIS — Z888 Allergy status to other drugs, medicaments and biological substances status: Secondary | ICD-10-CM | POA: Insufficient documentation

## 2012-04-24 DIAGNOSIS — N186 End stage renal disease: Secondary | ICD-10-CM | POA: Insufficient documentation

## 2012-04-24 DIAGNOSIS — Z87891 Personal history of nicotine dependence: Secondary | ICD-10-CM | POA: Insufficient documentation

## 2012-04-24 DIAGNOSIS — Z885 Allergy status to narcotic agent status: Secondary | ICD-10-CM | POA: Insufficient documentation

## 2012-04-24 DIAGNOSIS — E119 Type 2 diabetes mellitus without complications: Secondary | ICD-10-CM | POA: Insufficient documentation

## 2012-04-24 DIAGNOSIS — M549 Dorsalgia, unspecified: Secondary | ICD-10-CM

## 2012-04-24 DIAGNOSIS — K709 Alcoholic liver disease, unspecified: Secondary | ICD-10-CM | POA: Insufficient documentation

## 2012-04-24 DIAGNOSIS — F1021 Alcohol dependence, in remission: Secondary | ICD-10-CM | POA: Insufficient documentation

## 2012-04-24 MED ORDER — OXYCODONE HCL 5 MG PO TABS: 5.0000 mg | ORAL_TABLET | ORAL | Status: DC | PRN

## 2012-04-24 NOTE — ED Notes (Signed)
Pt with left side /back pain for 28 months after his lung surgery for pleural effusions and PNA

## 2012-04-24 NOTE — ED Provider Notes (Addendum)
History     CSN: [id number]  Arrival date & time 04/24/12  1026   First MD Initiated Contact with Patient 04/24/12 1033      No chief complaint on file.   (Consider location/radiation/quality/duration/timing/severity/associated sxs/prior treatment) HPI  61 year old male with history of diabetes, end-stage renal disease on (M-W-F) dialysis, and history of hypertension presents complaining of back pain. Patient reports he has history of back pain and history of slipped disc. He has been having pain to the left side of his back for over 2 years. Pains described as a stabbing sensation, constant, worsened with movement or when he lays on his left side. Pain is nonradiating. There is no associated fever, lightheadedness, chest pain, shortness of breath, rash, recent trauma, or urinary symptoms. He has been taking, last needed for pain. His pain has been worsening for the past 4 days which concerns him. He denies any new numbness weakness. Patient does make urine but denies any urinary complaint. He reports he has seen by orthopedic, Dr. Ophelia Charter this past september for his back pain and was treated with "liquid cement" which did provides transient relief.  Patient has history of alcohol abuse and history of liver cirrhosis. However states he has not been drinking alcohol as of late.    Past Medical History  Diagnosis Date  . Rectal bleeding   . Dialysis care   . GI bleed   . Hypertension   . Esophageal varices   . Alcoholic liver disease   . Hepatic encephalopathy   . Ascites   . Diabetes mellitus     Past Surgical History  Procedure Date  . Esophagogastroduodenoscopy 12/31/2010    EGD APC THERAPY  . Esophagogastroduodenoscopy 05/31/2012E    GD APC ABLATION  . Small bowel givens 12/01/2010  . Colonoscopy 09/07/2010  . Esophagogastroduodenoscopy 09/05/2010    OUTLAW  . Lung surgery 6/11    [place]  . Esophagogastroduodenoscopy 03/26/2011    Procedure:  ESOPHAGOGASTRODUODENOSCOPY (EGD);  Surgeon: Malissa Hippo, MD;  Location: [facility] ENDO SUITE;  Service: Endoscopy;  Laterality: N/A;  8:30 am  . Hot hemostasis 03/26/2011    Procedure: HOT HEMOSTASIS (ARGON PLASMA COAGULATION/BICAP);  Surgeon: Malissa Hippo, MD;  Location: [facility] ENDO SUITE;  Service: Endoscopy;  Laterality: N/A;    No family history on file.  History  Substance Use Topics  . Smoking status: Former Smoker -- 0.5 packs/day    Types: Cigarettes    Quit date: 08/11/2011  . Smokeless tobacco: Never Used  . Alcohol Use: No      Review of Systems  All other systems reviewed and are negative.    Allergies  Tylenol and Morphine and related  Home Medications   Current Outpatient Rx  Name Route Sig Dispense Refill  . ESOMEPRAZOLE MAGNESIUM 40 MG PO CPDR Oral Take 40 mg by mouth daily before breakfast.      . GLIPIZIDE 5 MG PO TABS Oral Take 5 mg by mouth 2 (two) times daily before a meal. At 6 am and 5 pm     . LACTULOSE 10 GM/15ML PO SOLN Oral Take 15 mLs (10 g total) by mouth 3 (three) times daily. 960 mL 5  . LANTHANUM CARBONATE 1000 MG PO CHEW Oral Chew 500 mg by mouth 3 (three) times daily with meals. Chew one-half tablet with meals and with snacks    . LIDOCAINE-PRILOCAINE 2.5-2.5 % EX CREA Topical Apply 1 application topically as needed. Rub on arm PRIOR TO DIALYSIS on Mondays, Wednesdays, and  Fridays.    [name] ONDANSETRON HCL 4 MG PO TABS Oral Take 4 mg by mouth every 12 (twelve) hours as needed. FOR NAUSEA    . POTASSIUM CHLORIDE ER 10 MEQ PO TBCR Oral Take 30 mEq by mouth 3 (three) times a week. TAKE 3 TABLETS ONCE A DAY THREE TIMES PER WEEK AFTER DIALYSIS on Mondays, Wednesdays, and Fridays.    [name] RIFAXIMIN 550 MG PO TABS Oral Take 1 tablet (550 mg total) by mouth 2 (two) times daily. Patient takes 1/2 pill in the morning and 1/2 pill at night 18 tablet 0  . TRAMADOL HCL 50 MG PO TABS Oral Take 50 mg by mouth every 4 (four) hours as needed. For PAIN      There were no  vitals taken for this visit.  Physical Exam  Nursing note reviewed. Constitutional: He is oriented to person, place, and time. He appears well-developed and well-nourished. No distress.  HENT:  Head: Atraumatic.  Eyes: Conjunctivae normal are normal.  Neck: Neck supple.  Abdominal: Bowel sounds are normal. He exhibits distension. There is no tenderness. There is no rebound and no guarding.       No CVA tenderness  Musculoskeletal: He exhibits tenderness (Increasing tenderness to mid and lower lumbar midline with back flexion and extension. No step-off).       Left forearm fistula with normal bruit and appears to be in good working order  Tenderness to left lateral aspects of mid and low back on percussion and palpation without overlying skin changes. Well-healing posterior scar in the left from prior lobectomy. No signs of infection  Neurological: He is alert and oriented to person, place, and time.  Skin: Skin is warm. No rash noted.  Psychiatric: He has a normal mood and affect.    ED Course  Procedures (including critical care time)  Labs Reviewed - No data to display No results found.   No diagnosis found.  1. Back pain  MDM  Patient is acute on chronic back pain. No urinary symptoms concerning for kidney stones, or pyelonephritis. No recent trauma concerning for acute fractures or dislocation. neurovascularly intact. Due to the chronicity of his pain and since pain is reproducible on exam, pain is likely to be musculoskeletal. Low suspicion for AAA, or aortic dissection. Discussed care with my attending.  Plan to prescribe a short course of pain medication and recommendation for followup with Dr. Ophelia Charter for further care. Patient otherwise appears nontoxic, afebrile, stable normal vital signs.        Fayrene Helper, PA-C 04/24/12 1100  Fayrene Helper, PA-C 04/24/12 1130

## 2012-04-24 NOTE — ED Provider Notes (Signed)
Medical screening examination/treatment/procedure(s) were conducted as a shared visit with non-physician practitioner(s) and myself.  I personally evaluated the patient during the encounter Pt with left sided mid back pain for past 2+ years. Constant. Pt notes kyphoplasty for same 1-2 months ago w transient relief in symptoms. Pain worse w change in position, turning/rotating torso, palpation area. No cough or sob. No cp. No pleuritic pain. No recent trauma/fall. Spine non tender. No fever/chills. abd soft nt, no masses.   Suzi Roots, MD 04/24/12 1125

## 2012-04-25 NOTE — ED Provider Notes (Signed)
Medical screening examination/treatment/procedure(s) were conducted as a shared visit with non-physician practitioner(s) and myself.  I personally evaluated the patient during the encountes\ See prior note  Suzi Roots, MD 04/25/12 [phone]

## 2012-05-26 ENCOUNTER — Encounter (HOSPITAL_COMMUNITY): Payer: Self-pay | Admitting: *Deleted

## 2012-05-26 ENCOUNTER — Emergency Department (HOSPITAL_COMMUNITY)
Admission: EM | Admit: 2012-05-26 | Discharge: 2012-05-26 | Disposition: A | Discharge status: Home / Self Care | Payer: Medicare Other | Attending: Emergency Medicine | Admitting: Emergency Medicine

## 2012-05-26 DIAGNOSIS — Z8719 Personal history of other diseases of the digestive system: Secondary | ICD-10-CM | POA: Insufficient documentation

## 2012-05-26 DIAGNOSIS — K7290 Hepatic failure, unspecified without coma: Secondary | ICD-10-CM | POA: Insufficient documentation

## 2012-05-26 DIAGNOSIS — I10 Essential (primary) hypertension: Secondary | ICD-10-CM | POA: Insufficient documentation

## 2012-05-26 DIAGNOSIS — Z79899 Other long term (current) drug therapy: Secondary | ICD-10-CM | POA: Insufficient documentation

## 2012-05-26 DIAGNOSIS — E1169 Type 2 diabetes mellitus with other specified complication: Secondary | ICD-10-CM | POA: Insufficient documentation

## 2012-05-26 DIAGNOSIS — R188 Other ascites: Secondary | ICD-10-CM | POA: Insufficient documentation

## 2012-05-26 DIAGNOSIS — K7682 Hepatic encephalopathy: Secondary | ICD-10-CM | POA: Insufficient documentation

## 2012-05-26 DIAGNOSIS — R739 Hyperglycemia, unspecified: Secondary | ICD-10-CM

## 2012-05-26 DIAGNOSIS — Z87891 Personal history of nicotine dependence: Secondary | ICD-10-CM | POA: Insufficient documentation

## 2012-05-26 HISTORY — DX: Serous retinal detachment, unspecified eye: H33.20

## 2012-05-26 MED ORDER — SODIUM CHLORIDE 0.9 % IV BOLUS (SEPSIS): 1000.0000 mL | Freq: Once | INTRAVENOUS | Status: DC

## 2012-05-26 NOTE — ED Notes (Signed)
Pt says his cbg was 407 at 5 pm.  Pts doctor told him to take extra glipizide.  Recent adm to [facility] for hyperglycemia.

## 2012-05-26 NOTE — ED Notes (Signed)
MD informed of CBG-  251      Awaiting additional orders prior to IV start or labs - patient does not want an IV started

## 2012-05-26 NOTE — ED Notes (Signed)
Informed patient that Dr. Estell Harpin still wants him to have IV fluid and labs completed.  Patient is refusing IV fluid, states that he doesn't want fluids because dialysis will have to pull harder.  Dr. Estell Harpin aware and orders cancelled.

## 2012-05-26 NOTE — ED Provider Notes (Signed)
History  This chart was scribed for Timothy Lennert, MD by Timothy Chan, ED Scribe. The patient was seen in room APA05/APA05. Patient's care was started at 2135.   CSN: [id number]  Arrival date & time 05/26/12  2123   First MD Initiated Contact with Patient 05/26/12 2135      Chief Complaint  Patient presents with  . Hyperglycemia    Patient is a 61 y.o. male presenting with diabetes problem. The history is provided by the patient. No language interpreter was used.  Diabetes He has type 2 diabetes mellitus. Pertinent negatives for hypoglycemia include no headaches or seizures. Pertinent negatives for diabetes include no chest pain and no fatigue. Hypoglycemia complications include hospitalization. Risk factors for coronary artery disease include diabetes mellitus and hypertension. He is compliant with treatment all of the time. His home blood glucose trend is fluctuating minimally. His lunch blood glucose range is generally >200 mg/dl.     HPI Comments: Timothy Chan is a 61 y.o. male with a history of diabetes who presents to the Emergency Department complaining of hyperglycemia onset 4-5 hours ago. Patient states that he ate lunch at 3:00 PM. When he checked his CBG at 5:00 PM, it was 407. Patient says that he called his doctor who referred him to the ED for further evaluation. Patient was admitted to [facility] on Wednesday, 05/24/12 for hyperglycemia and was discharged yesterday. Patient states that his level was 475 when he arrived at [facility]. Patient denies any other symptoms at this time. Other medical history includes end stage renal disease, alcoholic liver disease, hepatic encephalopathy, HTN and dialysis care. He is a former smoker (quit date: 08/11/2011).  PCP - Timothy Chan   Past Medical History  Diagnosis Date  . Rectal bleeding   . Dialysis care   . GI bleed   . Hypertension   . Esophageal varices   . Alcoholic liver disease   . Hepatic encephalopathy   .  Ascites   . Diabetes mellitus   . Detached retina     Past Surgical History  Procedure Date  . Esophagogastroduodenoscopy 12/31/2010    EGD [facility] THERAPY  . Esophagogastroduodenoscopy 05/31/2012E    GD APC ABLATION  . Small bowel givens 12/01/2010  . Colonoscopy 09/07/2010  . Esophagogastroduodenoscopy 09/05/2010    OUTLAW  . Lung surgery 6/11    [place]  . Esophagogastroduodenoscopy 03/26/2011    Procedure: ESOPHAGOGASTRODUODENOSCOPY (EGD);  Surgeon: Malissa Hippo, MD;  [facility];  Service: Endoscopy;  Laterality: N/A;  8:30 am  . Hot hemostasis 03/26/2011    Procedure: HOT HEMOSTASIS (ARGON PLASMA COAGULATION/BICAP);  Surgeon: Malissa Hippo, MD;  Location: [facility];  Service: Endoscopy;  Laterality: N/A;  . Stent in liver     History reviewed. No pertinent family history.  History  Substance Use Topics  . Smoking status: Former Smoker -- 0.5 packs/day    Types: Cigarettes    Quit date: 08/11/2011  . Smokeless tobacco: Never Used  . Alcohol Use: No      Review of Systems  Constitutional: Negative for fatigue.  HENT: Negative for congestion, sinus pressure and ear discharge.   Eyes: Negative for discharge.  Respiratory: Negative for cough.   Cardiovascular: Negative for chest pain.  Gastrointestinal: Negative for abdominal pain and diarrhea.  Genitourinary: Negative for frequency and hematuria.  Musculoskeletal: Negative for back pain.  Skin: Negative for rash.  Neurological: Negative for seizures and headaches.  Hematological: Negative.   Psychiatric/Behavioral: Negative  for hallucinations.    Allergies  Tylenol and Morphine and related  Home Medications   Current Outpatient Rx  Name  Route  Sig  Dispense  Refill  . CIPROFLOXACIN HCL 0.3 % OP SOLN   Right Eye   Place 1 drop into the right eye every 2 (two) hours.         [name] ESOMEPRAZOLE MAGNESIUM 40 MG PO CPDR   Oral   Take 40 mg by mouth daily before breakfast.            . FUROSEMIDE 80 MG PO TABS   Oral   Take 80 mg by mouth 2 (two) times daily.         [name] GABAPENTIN 300 MG PO CAPS   Oral   Take 300 mg by mouth 2 (two) times daily.         [name] GLIPIZIDE 5 MG PO TABS   Oral   Take 5 mg by mouth 2 (two) times daily before a meal. At 6 am and 5 pm          . LACTULOSE 10 GM/15ML PO SOLN   Oral   Take 20 g by mouth at bedtime.         [name] LANTHANUM CARBONATE 1000 MG PO CHEW   Oral   Chew 500-1,000 mg by mouth 3 (three) times daily with meals. Chew one-half tablet with meals and with snacks         . LIDOCAINE-PRILOCAINE 2.5-2.5 % EX CREA   Topical   Apply 1 application topically as needed. Rub on arm PRIOR TO DIALYSIS on Mondays, Wednesdays, and Fridays.         [name] RENA-VITE PO TABS   Oral   Take 1 tablet by mouth daily.         [name] ONDANSETRON HCL 4 MG PO TABS   Oral   Take 4 mg by mouth every 12 (twelve) hours as needed. FOR NAUSEA         . OXYCODONE HCL 5 MG PO TABS   Oral   Take 1 tablet (5 mg total) by mouth every 4 (four) hours as needed for pain.   20 tablet   0   . POTASSIUM CHLORIDE ER 10 MEQ PO TBCR   Oral   Take 30 mEq by mouth See admin instructions. Takes 30 meq post dialysis on Monday, Wednesday, and Friday.         [name] PREDNISOLONE ACETATE 1 % OP SUSP   Right Eye   Place 1 drop into the right eye 4 (four) times daily.         [name] RIFAXIMIN 550 MG PO TABS   Oral   Take 275 mg by mouth 2 (two) times daily. Patient takes 1/2 pill in the morning and 1/2 pill at night           Triage Vitals: BP 128/66  Pulse 88  Temp 98.8 F (37.1 C) (Oral)  Resp 20  Ht 6\' 3"  (1.905 m)  Wt 208 lb (94.348 kg)  BMI 26.00 kg/m2  SpO2 98%  Physical Exam  Constitutional: He is oriented to person, place, and time. He appears well-developed.  HENT:  Head: Normocephalic and atraumatic.  Mouth/Throat: Mucous membranes are dry.  Eyes: Conjunctivae normal and EOM are normal. No scleral icterus.  Neck: Neck supple. No  thyromegaly present.  Cardiovascular: Normal rate and regular rhythm.  Exam reveals no gallop and no friction rub.   No murmur heard.  Pulmonary/Chest: No stridor. He has no wheezes. He has no rales. He exhibits no tenderness.  Abdominal: He exhibits no distension. There is no tenderness. There is no rebound.  Musculoskeletal: Normal range of motion. He exhibits no edema.  Lymphadenopathy:    He has no cervical adenopathy.  Neurological: He is oriented to person, place, and time. Coordination normal.  Skin: No rash noted. No erythema.  Psychiatric: He has a normal mood and affect. His behavior is normal.    ED Course  Procedures (including critical care time) DIAGNOSTIC STUDIES: Oxygen Saturation is 98% on room air, normal by my interpretation.    COORDINATION OF CARE: 9:40 PM- Patient informed of current plan for treatment and evaluation and agrees with plan at this time. Will administer 1000 ml IV fluids and order basic metabolic panel and CBC.  Results for orders placed during the hospital encounter of 05/26/12  GLUCOSE, CAPILLARY      Component Value Range   Glucose-Capillary 251 (*) 70 - 99 mg/dL    Labs Reviewed - No data to display  No results found.   No diagnosis found.  Pt refused blood work  MDM        The chart was scribed for me under my direct supervision.  I personally performed the history, physical, and medical decision making and all procedures in the evaluation of this patient.Timothy Lennert, MD 05/26/12 2245

## 2012-05-26 NOTE — ED Notes (Signed)
Informed the Nurse Tech he wants to go home

## 2012-05-26 NOTE — ED Notes (Addendum)
States he was discharged from [facility] yesterday and his blood sugar was high and he was never given anything no insulin no glypizide - nothing.  Had dialysis today and blood sugar was in the 300's.  Was given fluid at dialysis due to his headache.    "Just wants a shot of insulin and then send him home"

## 2012-06-14 ENCOUNTER — Encounter (INDEPENDENT_AMBULATORY_CARE_PROVIDER_SITE_OTHER): Payer: Self-pay | Admitting: *Deleted

## 2012-07-07 ENCOUNTER — Telehealth (INDEPENDENT_AMBULATORY_CARE_PROVIDER_SITE_OTHER): Payer: Self-pay | Admitting: *Deleted

## 2012-07-07 NOTE — Telephone Encounter (Signed)
Timothy Chan would like to know if there is something cheaper he can take or stop until the office gets more samples in? He is taking Xifaxan.  Jauan has no pills left as of Monday 07/10/12.  His return phone number is [phone] after 3:00 pm.

## 2012-07-10 NOTE — Telephone Encounter (Signed)
We currently do not have any samples of Xifaxan.

## 2012-07-11 ENCOUNTER — Ambulatory Visit: Payer: Self-pay | Admitting: Vascular Surgery

## 2012-07-11 DIAGNOSIS — E119 Type 2 diabetes mellitus without complications: Secondary | ICD-10-CM

## 2012-07-11 LAB — BASIC METABOLIC PANEL
Anion Gap: 9 (ref 7–16)
Chloride: 100 mmol/L (ref 98–107)
Co2: 26 mmol/L (ref 21–32)
Creatinine: 6.28 mg/dL — ABNORMAL HIGH (ref 0.60–1.30)
EGFR (Non-African Amer.): 9 — ABNORMAL LOW
Glucose: 244 mg/dL — ABNORMAL HIGH (ref 65–99)
Potassium: 3.6 mmol/L (ref 3.5–5.1)
Sodium: 135 mmol/L — ABNORMAL LOW (ref 136–145)

## 2012-07-11 LAB — CBC
HCT: 30.2 % — ABNORMAL LOW (ref 40.0–52.0)
HGB: 10.6 g/dL — ABNORMAL LOW (ref 13.0–18.0)
MCH: 32.6 pg (ref 26.0–34.0)
MCHC: 35 g/dL (ref 32.0–36.0)
RBC: 3.24 10*6/uL — ABNORMAL LOW (ref 4.40–5.90)
RDW: 15.1 % — ABNORMAL HIGH (ref 11.5–14.5)

## 2012-07-11 LAB — HEPATIC FUNCTION PANEL A (ARMC)
Alkaline Phosphatase: 147 U/L — ABNORMAL HIGH (ref 50–136)
Bilirubin, Direct: 0.2 mg/dL (ref 0.00–0.20)

## 2012-07-18 NOTE — Telephone Encounter (Signed)
Patient advised no samples available.

## 2012-07-20 ENCOUNTER — Ambulatory Visit: Payer: Self-pay | Admitting: Vascular Surgery

## 2012-08-08 ENCOUNTER — Ambulatory Visit (INDEPENDENT_AMBULATORY_CARE_PROVIDER_SITE_OTHER): Payer: Medicare Other | Admitting: Internal Medicine

## 2012-08-08 ENCOUNTER — Encounter (INDEPENDENT_AMBULATORY_CARE_PROVIDER_SITE_OTHER): Payer: Self-pay | Admitting: Internal Medicine

## 2012-08-08 VITALS — BP 130/80 | HR 86 | Temp 98.0°F | Resp 18 | Ht 75.0 in | Wt 229.1 lb

## 2012-08-08 DIAGNOSIS — K7290 Hepatic failure, unspecified without coma: Secondary | ICD-10-CM

## 2012-08-08 DIAGNOSIS — K7460 Unspecified cirrhosis of liver: Secondary | ICD-10-CM

## 2012-08-08 NOTE — Progress Notes (Signed)
Presenting complaint;  Followup for decompensated cirrhosis.  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 He was last seen 4 months ago. He has been evaluated at [facility] and felt not to be candidate for hepatorenal transplant is still staying in touch with transplant coordinator. He had a PD catheter placed at [facility] and is going to start peritoneal dialysis next week. He believes he had had a wide is that he is getting better. His appetite is up and down. He denies nausea vomiting abdominal pain melena or rectal bleeding. He has not experienced confusion episodes as long as he takes Xifaxan and would like to get more samples. He states his last hemoglobin was 11.6 g. He states he has not require transfusion since last APC therapy for gait in September 2012. His last colon[place] was in [place] in February 2012.  Current Medications: Current Outpatient Prescriptions  Medication Sig Dispense Refill  . Bisacodyl (LAXATIVE PO) Take 1 tablet by mouth daily as needed. For relief      . esomeprazole (NEXIUM) 40 MG capsule Take 40 mg by mouth daily as needed. For indigestion      . furosemide (LASIX) 80 MG tablet Take 80 mg by mouth 2 (two) times daily.      [name] glipiZIDE (GLUCOTROL) 5 MG tablet Take 10 mg by mouth 2 (two) times daily. At 6 am and 5 pm      . lactulose (CHRONULAC) 10 GM/15ML solution Take 10 g by mouth 2 (two) times daily.       [name] lanthanum (FOSRENOL) 1000 MG chewable tablet Chew 500-1,000 mg by mouth 3 (three) times daily with meals. Patient states that he chew 1 tablet with each meal , and 1/2 tablet with snack      . lidocaine-prilocaine (EMLA) cream Apply 1 application topically as needed. Rub on arm PRIOR TO DIALYSIS on Mondays, Wednesdays, and Fridays.      . multivitamin (RENA-VIT) TABS tablet Take 1 tablet by mouth daily.      . ondansetron (ZOFRAN) 4 MG  tablet Take 4 mg by mouth every 12 (twelve) hours as needed. FOR NAUSEA      . potassium chloride (K-DUR) 10 MEQ tablet Take 30 mEq by mouth See admin instructions. Takes 30 meq post dialysis on Monday, Wednesday, and Friday.      . traMADol (ULTRAM) 50 MG tablet Take 50 mg by mouth every 6 (six) hours as needed. For pain      . rifaximin (XIFAXAN) 550 MG TABS Take 275 mg by mouth 2 (two) times daily. Patient takes 1/2 pill in the morning and 1/2 pill at night         Objective: Blood pressure 130/80, pulse 86, temperature 98 F (36.7 C), temperature source Oral, resp. rate 18, height 6\' 3"  (1.905 m), weight 229 lb 1.6 oz (103.919 kg). Patient is alert and does not have asterixis. Conjunctiva is pink. Sclera is nonicteric Oropharyngeal mucosa is normal. No neck masses or thyromegaly noted. Cardiac exam with regular rhythm normal S1 and S2. No murmur or gallop noted. Lungs are clear to auscultation. Abdomen is full distressing to her right midabdomen device held in the right periumblical region.  No LE edema or clubbing noted.   Assessment:  Decompensated alcoholic cirrhosis. He had TIPS for refractory ascites few years ago at [facility]. He has encephalopathy responding to therapy. He is due for screening for HCC.  Plan:  Sample of Xifaxan given to patient. He will have AFP with his next blood draw at renal clinic. Hepatic ultrasound for screening for HCC. Office visit in 4 months.

## 2012-08-08 NOTE — Patient Instructions (Addendum)
Ultrasound of upper abdomen to be scheduled. Physician will contact you with results of ultrasound and blood work and completed.

## 2012-08-10 ENCOUNTER — Ambulatory Visit (HOSPITAL_COMMUNITY)
Admission: RE | Admit: 2012-08-10 | Discharge: 2012-08-10 | Disposition: A | Discharge status: Home / Self Care | Payer: Medicare Other | Source: Ambulatory Visit | Attending: Internal Medicine | Admitting: Internal Medicine

## 2012-08-10 DIAGNOSIS — R188 Other ascites: Secondary | ICD-10-CM | POA: Insufficient documentation

## 2012-08-10 DIAGNOSIS — K7460 Unspecified cirrhosis of liver: Secondary | ICD-10-CM | POA: Insufficient documentation

## 2012-08-14 ENCOUNTER — Telehealth (INDEPENDENT_AMBULATORY_CARE_PROVIDER_SITE_OTHER): Payer: Self-pay | Admitting: *Deleted

## 2012-08-14 NOTE — Telephone Encounter (Signed)
Dr.Rehman the number is noted below for the [facility].

## 2012-08-14 NOTE — Telephone Encounter (Signed)
Timothy Chan requested Timothy Chan from [facility] call Timothy Chan office about their resent findings. Timothy Chan would like to speak with Timothy Chan or Timothy Chan and her return phone number is [phone].

## 2012-08-15 ENCOUNTER — Other Ambulatory Visit (INDEPENDENT_AMBULATORY_CARE_PROVIDER_SITE_OTHER): Payer: Self-pay | Admitting: Internal Medicine

## 2012-08-15 DIAGNOSIS — K7460 Unspecified cirrhosis of liver: Secondary | ICD-10-CM

## 2012-08-15 DIAGNOSIS — R935 Abnormal findings on diagnostic imaging of other abdominal regions, including retroperitoneum: Secondary | ICD-10-CM

## 2012-08-16 ENCOUNTER — Encounter: Payer: Self-pay | Admitting: Cardiology

## 2012-08-16 DIAGNOSIS — R079 Chest pain, unspecified: Secondary | ICD-10-CM

## 2012-08-16 DIAGNOSIS — R0602 Shortness of breath: Secondary | ICD-10-CM

## 2012-08-17 NOTE — Telephone Encounter (Signed)
I called Ms. Timothy Chan yesterday and have not heard back from her

## 2012-08-18 ENCOUNTER — Telehealth (INDEPENDENT_AMBULATORY_CARE_PROVIDER_SITE_OTHER): Payer: Self-pay | Admitting: *Deleted

## 2012-08-18 NOTE — Telephone Encounter (Signed)
Timothy Chan just got out of [facility] last night, 08/17/12, at 9:30 pm. He said he is still passing blood and was told to he needed to have a full blood count done. Advised Timothy Chan that Dr. Karilyn Cota is not in the office that he needed to call his PCP or go [facility] ED. Timothy Chan said "that he was not going back to Dr. Sherril Croon because he was paged 5 times the night he went to [facility] ED and never heard back from him and he will go to the ED next week if the blood doesn't clear up." Scheduled an apt with Terri on 08/22/12. One was offered for Monday and he refused stating he had dialysis then. There return phone number is [phone].

## 2012-08-18 NOTE — Telephone Encounter (Signed)
I have called [facility] and have ask that they fax to [place] the notes from last night ED visit. I will tehn review it with Delrae Rend.

## 2012-08-18 NOTE — Telephone Encounter (Signed)
Terri reviewed the results from [facility]. She states that the patient should go to ED as his HGB is 7.4 on 08/17/12 and on 08/16/12 it was 8.0. She is going to make Dr.Rehman aware , patient will be called again and advised that he needs to go to the ED for further evaluation.

## 2012-08-22 ENCOUNTER — Ambulatory Visit (INDEPENDENT_AMBULATORY_CARE_PROVIDER_SITE_OTHER): Payer: Medicare Other | Admitting: Internal Medicine

## 2012-08-24 ENCOUNTER — Ambulatory Visit (HOSPITAL_COMMUNITY)
Admission: RE | Admit: 2012-08-24 | Discharge: 2012-08-24 | Disposition: A | Discharge status: Home / Self Care | Payer: Medicare Other | Source: Ambulatory Visit | Attending: Internal Medicine | Admitting: Internal Medicine

## 2012-08-24 DIAGNOSIS — K7460 Unspecified cirrhosis of liver: Secondary | ICD-10-CM | POA: Insufficient documentation

## 2012-08-24 DIAGNOSIS — R935 Abnormal findings on diagnostic imaging of other abdominal regions, including retroperitoneum: Secondary | ICD-10-CM | POA: Insufficient documentation

## 2012-08-24 MED ORDER — IOHEXOL 300 MG/ML  SOLN
100.0000 mL | Freq: Once | INTRAMUSCULAR | Status: AC | PRN
  Administered 2012-08-24: 100 mL via INTRAVENOUS

## 2012-08-30 ENCOUNTER — Telehealth (INDEPENDENT_AMBULATORY_CARE_PROVIDER_SITE_OTHER): Payer: Self-pay | Admitting: *Deleted

## 2012-08-30 NOTE — Telephone Encounter (Signed)
Patient called and a message left that when Dr.Rehman gets the results of CT , he would contact him with those. results

## 2012-08-30 NOTE — Telephone Encounter (Signed)
Would like to get the results of his CT done on 08/24/12. The return phone number is [phone].

## 2012-08-31 ENCOUNTER — Encounter (INDEPENDENT_AMBULATORY_CARE_PROVIDER_SITE_OTHER): Payer: Self-pay

## 2012-09-18 ENCOUNTER — Encounter (HOSPITAL_COMMUNITY): Payer: Self-pay | Admitting: *Deleted

## 2012-09-18 ENCOUNTER — Telehealth (INDEPENDENT_AMBULATORY_CARE_PROVIDER_SITE_OTHER): Payer: Self-pay | Admitting: *Deleted

## 2012-09-18 ENCOUNTER — Emergency Department (HOSPITAL_COMMUNITY)
Admission: EM | Admit: 2012-09-18 | Discharge: 2012-09-18 | Disposition: A | Discharge status: Home / Self Care | Payer: Medicare Other | Attending: Emergency Medicine | Admitting: Emergency Medicine

## 2012-09-18 ENCOUNTER — Emergency Department (HOSPITAL_COMMUNITY): Payer: Medicare Other

## 2012-09-18 DIAGNOSIS — E119 Type 2 diabetes mellitus without complications: Secondary | ICD-10-CM | POA: Insufficient documentation

## 2012-09-18 DIAGNOSIS — Z992 Dependence on renal dialysis: Secondary | ICD-10-CM | POA: Insufficient documentation

## 2012-09-18 DIAGNOSIS — Z79899 Other long term (current) drug therapy: Secondary | ICD-10-CM | POA: Insufficient documentation

## 2012-09-18 DIAGNOSIS — Z8719 Personal history of other diseases of the digestive system: Secondary | ICD-10-CM | POA: Insufficient documentation

## 2012-09-18 DIAGNOSIS — K7682 Hepatic encephalopathy: Secondary | ICD-10-CM | POA: Insufficient documentation

## 2012-09-18 DIAGNOSIS — R142 Eructation: Secondary | ICD-10-CM | POA: Insufficient documentation

## 2012-09-18 DIAGNOSIS — R141 Gas pain: Secondary | ICD-10-CM | POA: Insufficient documentation

## 2012-09-18 DIAGNOSIS — H3320 Serous retinal detachment, unspecified eye: Secondary | ICD-10-CM | POA: Insufficient documentation

## 2012-09-18 DIAGNOSIS — Z8619 Personal history of other infectious and parasitic diseases: Secondary | ICD-10-CM | POA: Insufficient documentation

## 2012-09-18 DIAGNOSIS — Z9889 Other specified postprocedural states: Secondary | ICD-10-CM | POA: Insufficient documentation

## 2012-09-18 DIAGNOSIS — I10 Essential (primary) hypertension: Secondary | ICD-10-CM | POA: Insufficient documentation

## 2012-09-18 DIAGNOSIS — K5900 Constipation, unspecified: Secondary | ICD-10-CM | POA: Insufficient documentation

## 2012-09-18 DIAGNOSIS — R110 Nausea: Secondary | ICD-10-CM | POA: Insufficient documentation

## 2012-09-18 DIAGNOSIS — Z87891 Personal history of nicotine dependence: Secondary | ICD-10-CM | POA: Insufficient documentation

## 2012-09-18 DIAGNOSIS — Z8669 Personal history of other diseases of the nervous system and sense organs: Secondary | ICD-10-CM | POA: Insufficient documentation

## 2012-09-18 LAB — COMPREHENSIVE METABOLIC PANEL
BUN: 39 mg/dL — ABNORMAL HIGH (ref 6–23)
CO2: 28 mEq/L (ref 19–32)
Chloride: 95 mEq/L — ABNORMAL LOW (ref 96–112)
Creatinine, Ser: 8.08 mg/dL — ABNORMAL HIGH (ref 0.50–1.35)
GFR calc Af Amer: 7 mL/min — ABNORMAL LOW (ref >90)
GFR calc non Af Amer: 6 mL/min — ABNORMAL LOW (ref >90)
Glucose, Bld: 249 mg/dL — ABNORMAL HIGH (ref 70–99)
Total Bilirubin: 0.8 mg/dL (ref 0.3–1.2)

## 2012-09-18 LAB — AMMONIA: Ammonia: 66 umol/L — ABNORMAL HIGH (ref 11–60)

## 2012-09-18 LAB — CBC WITH DIFFERENTIAL/PLATELET
Basophils Absolute: 0.1 10*3/uL (ref 0.0–0.1)
Eosinophils Absolute: 0.4 10*3/uL (ref 0.0–0.7)
Lymphocytes Relative: 21 % (ref 12–46)
Lymphs Abs: 1.6 10*3/uL (ref 0.7–4.0)
Neutrophils Relative %: 60 % (ref 43–77)
Platelets: 159 10*3/uL (ref 150–400)
RBC: 3.37 MIL/uL — ABNORMAL LOW (ref 4.22–5.81)
WBC: 7.6 10*3/uL (ref 4.0–10.5)

## 2012-09-18 MED ORDER — FLEET ENEMA 7-19 GM/118ML RE ENEM
1.0000 | ENEMA | Freq: Once | RECTAL | Status: AC
  Administered 2012-09-18: 1 via RECTAL

## 2012-09-18 MED ORDER — PEG 3350-KCL-NABCB-NACL-NASULF 236 G PO SOLR: 4.0000 L | Freq: Once | ORAL | Status: DC

## 2012-09-18 NOTE — Telephone Encounter (Signed)
Timothy Chan said he has not had a bowel movement for 7 days. He has tried Lactulose and it has not helped. Timothy Chan started vomiting this morning, 09/18/12. His return phone number is [phone].

## 2012-09-18 NOTE — ED Provider Notes (Signed)
History     This chart was scribed for Charles B. Bernette Mayers, MD, MD by Smitty Pluck, ED Scribe. The patient was seen in room APA02/APA02 and the patient's care was started at 3:17 PM.   CSN: [id number]  Arrival date & time 09/18/12  1232       Chief Complaint  Patient presents with  . Constipation    The history is provided by the patient and medical records. No language interpreter was used.   Timothy Chan is a 62 y.o. male with hx of dialysis (started 3 years), DM, GI Bleed and who presents to the Emergency Department complaining of constant, moderate constipation onset 7 days ago. Pt reports that he has nausea. He reports that he was seen in [place] ED 3 days ago and had normal labs. He states he has taken laxatives without relief. Pt denies fever, chills, nausea, vomiting, diarrhea, weakness, cough, SOB and any other pain. He reports having stents placed in his liver due to cirrhosis.   PCP is Dr. Sherril Croon    Past Medical History  Diagnosis Date  . Rectal bleeding   . Dialysis care   . GI bleed   . Hypertension   . Esophageal varices   . Alcoholic liver disease   . Hepatic encephalopathy   . Ascites   . Diabetes mellitus   . Detached retina     Past Surgical History  Procedure Laterality Date  . Esophagogastroduodenoscopy  12/31/2010    EGD [facility] THERAPY  . Esophagogastroduodenoscopy  05/31/2012E    GD APC ABLATION  . Small bowel givens  12/01/2010  . Colonoscopy  09/07/2010  . Esophagogastroduodenoscopy  09/05/2010    OUTLAW  . Lung surgery  6/11    [place]  . Esophagogastroduodenoscopy  03/26/2011    Procedure: ESOPHAGOGASTRODUODENOSCOPY (EGD);  Surgeon: Malissa Hippo, MD;  [facility];  Service: Endoscopy;  Laterality: N/A;  8:30 am  . Hot hemostasis  03/26/2011    Procedure: HOT HEMOSTASIS (ARGON PLASMA COAGULATION/BICAP);  Surgeon: Malissa Hippo, MD;  Location: [facility];  Service: Endoscopy;  Laterality: N/A;  . Stent in liver       History reviewed. No pertinent family history.  History  Substance Use Topics  . Smoking status: Former Smoker -- 0.50 packs/day    Types: Cigarettes    Quit date: 08/11/2011  . Smokeless tobacco: Never Used  . Alcohol Use: No      Review of Systems10 Systems reviewed and all are negative for acute change except as noted in the HPI.    Allergies  Tylenol and Morphine and related  Home Medications   Current Outpatient Rx  Name  Route  Sig  Dispense  Refill  . Bisacodyl (LAXATIVE PO)   Oral   Take 1 tablet by mouth daily as needed. For relief         . esomeprazole (NEXIUM) 40 MG capsule   Oral   Take 40 mg by mouth daily as needed. For indigestion         . furosemide (LASIX) 80 MG tablet   Oral   Take 80 mg by mouth 2 (two) times daily.         [name] glipiZIDE (GLUCOTROL) 5 MG tablet   Oral   Take 10 mg by mouth 2 (two) times daily. At 6 am and 5 pm         . lactulose (CHRONULAC) 10 GM/15ML solution   Oral   Take 10 g  by mouth 2 (two) times daily.          [name] lanthanum (FOSRENOL) 1000 MG chewable tablet   Oral   Chew 500-1,000 mg by mouth 3 (three) times daily with meals. Patient states that he chew 1 tablet with each meal , and 1/2 tablet with snack         . lidocaine-prilocaine (EMLA) cream   Topical   Apply 1 application topically as needed. Rub on arm PRIOR TO DIALYSIS on Mondays, Wednesdays, and Fridays.         . multivitamin (RENA-VIT) TABS tablet   Oral   Take 1 tablet by mouth daily.         . ondansetron (ZOFRAN) 4 MG tablet   Oral   Take 4 mg by mouth every 12 (twelve) hours as needed. FOR NAUSEA         . potassium chloride (K-DUR) 10 MEQ tablet   Oral   Take 30 mEq by mouth See admin instructions. Takes 30 meq post dialysis on Monday, Wednesday, and Friday.         . rifaximin (XIFAXAN) 550 MG TABS   Oral   Take 275 mg by mouth 2 (two) times daily. Patient takes 1/2 pill in the morning and 1/2 pill at night          . traMADol (ULTRAM) 50 MG tablet   Oral   Take 50 mg by mouth every 6 (six) hours as needed. For pain           BP 132/60  Pulse 71  Temp(Src) 97.7 F (36.5 C) (Oral)  Resp 18  Ht 6\' 3"  (1.905 m)  Wt 226 lb (102.513 kg)  BMI 28.25 kg/m2  SpO2 98%  Physical Exam  Nursing note and vitals reviewed. Constitutional: He is oriented to person, place, and time. He appears well-developed and well-nourished.  HENT:  Head: Normocephalic and atraumatic.  Eyes: EOM are normal. Pupils are equal, round, and reactive to light.  Neck: Normal range of motion. Neck supple.  Cardiovascular: Normal rate, normal heart sounds and intact distal pulses.   Pulmonary/Chest: Effort normal and breath sounds normal.  Abdominal: He exhibits distension. Bowel sounds are decreased. There is no tenderness.  Musculoskeletal: Normal range of motion. He exhibits no edema and no tenderness.  Neurological: He is alert and oriented to person, place, and time. He has normal strength. No cranial nerve deficit or sensory deficit.  Skin: Skin is warm and dry. No rash noted.  Psychiatric: He has a normal mood and affect.    ED Course  Procedures (including critical care time) DIAGNOSTIC STUDIES: Oxygen Saturation is 98% on room air, normal by my interpretation.    COORDINATION OF CARE: 3:22 PM Discussed ED treatment with pt and pt agrees.     Labs Reviewed  COMPREHENSIVE METABOLIC PANEL - Abnormal; Notable for the following:    Potassium 3.2 (*)    Chloride 95 (*)    Glucose, Bld 249 (*)    BUN 39 (*)    Creatinine, Ser 8.08 (*)    Albumin 2.8 (*)    Alkaline Phosphatase 120 (*)    GFR calc non Af Amer 6 (*)    GFR calc Af Amer 7 (*)    All other components within normal limits  CBC WITH DIFFERENTIAL - Abnormal; Notable for the following:    RBC 3.37 (*)    Hemoglobin 10.6 (*)    HCT 32.0 (*)    Monocytes Relative  13 (*)    All other components within normal limits  AMMONIA - Abnormal; Notable  for the following:    Ammonia 66 (*)    All other components within normal limits   Dg Abd Acute W/chest  09/18/2012  *RADIOLOGY REPORT*  Clinical Data: Abdominal pain.  Swelling.  Question of small bowel obstruction.  The patient is on peritoneal dialysis.  ACUTE ABDOMEN SERIES (ABDOMEN 2 VIEW & CHEST 1 VIEW)  Comparison: 09/15/2012  Findings: Heart is mildly enlarged.  There is right-sided pleural thickening and/or pleural effusion.  Left side shows emphysematous changes but no focal consolidations.  There is minimal atelectasis bilaterally. At the right lung base, there is a rounded density which measures 5.0 cm in diameter, suspicious for mass or rounded atelectasis.  By previous CT exam, this has been a stable finding since 2012 and may represent rounded atelectasis.  Right upper quadrant TIPSS is visualized.  There is metallic density throughout the stool.  Residual contrast present. Nonobstructive bowel gas pattern.  Degenerative changes are seen in the spine and hips.  IMPRESSION:  1. 1.  Right lung base atelectasis, pleural change, and rounded density as described. 2.  Nonobstructed bowel gas pattern with significant residual contrast/metallic density in the stool. 3.  Prior TIPSS.   Original Report Authenticated By: Norva Pavlov, M.D.      No diagnosis found.    MDM  Labs and imaging as above, unremarkable. Abdomen is benign, no fever, and no leukocytosis to suggest SBP. No SBO on AAS. Pt has had some results with enema in the ED. Advised to continue home meds and use miralax/golytely for continued constipation at home. PCP followup.   I personally performed the services described in this documentation, which was scribed in my presence. The recorded information has been reviewed and is accurate.         Charles B. Bernette Mayers, MD 09/18/12 1745

## 2012-09-18 NOTE — ED Notes (Addendum)
Pt says no bm for 8 days,  Seen at [facility] on Friday for same sx.  .  Says his bp has been elevated.   and cbg has been running over 200.  Pt is on peritoneal dialysis

## 2012-09-18 NOTE — ED Notes (Signed)
Pt performs peritoneal dialysis at home.  States injection of fluid approx 1145 today.  Pt states does not need to be drained until approx 1900.

## 2012-09-18 NOTE — Telephone Encounter (Signed)
Per Dr.Rehman the patient should use 2 fleet enemas and double up on his lactulose. Patient called and made aware.

## 2012-09-26 ENCOUNTER — Telehealth (INDEPENDENT_AMBULATORY_CARE_PROVIDER_SITE_OTHER): Payer: Self-pay | Admitting: *Deleted

## 2012-09-26 NOTE — Telephone Encounter (Signed)
Dr.Rehman to call patient

## 2012-09-26 NOTE — Telephone Encounter (Signed)
Timothy Chan said he still has not heard back on his CT scan. He would like to get the results. Please have Dr. Karilyn Cota return his call to [phone].

## 2012-10-05 ENCOUNTER — Encounter (INDEPENDENT_AMBULATORY_CARE_PROVIDER_SITE_OTHER): Payer: Self-pay

## 2012-10-05 ENCOUNTER — Telehealth (INDEPENDENT_AMBULATORY_CARE_PROVIDER_SITE_OTHER): Payer: Self-pay | Admitting: *Deleted

## 2012-10-05 NOTE — Telephone Encounter (Signed)
This has been addressed.

## 2012-10-05 NOTE — Telephone Encounter (Signed)
Open Error

## 2012-10-20 ENCOUNTER — Inpatient Hospital Stay (HOSPITAL_COMMUNITY)
Admission: EM | Admit: 2012-10-20 | Discharge: 2012-10-24 | DRG: 441 | Discharge status: Against Medical Advice | Payer: Medicare Other | Attending: Internal Medicine | Admitting: Internal Medicine

## 2012-10-20 ENCOUNTER — Encounter (HOSPITAL_COMMUNITY): Payer: Self-pay | Admitting: Emergency Medicine

## 2012-10-20 ENCOUNTER — Emergency Department (HOSPITAL_COMMUNITY): Payer: Medicare Other

## 2012-10-20 DIAGNOSIS — Z6828 Body mass index (BMI) 28.0-28.9, adult: Secondary | ICD-10-CM

## 2012-10-20 DIAGNOSIS — Z91158 Patient's noncompliance with renal dialysis for other reason: Secondary | ICD-10-CM

## 2012-10-20 DIAGNOSIS — R4182 Altered mental status, unspecified: Secondary | ICD-10-CM | POA: Diagnosis present

## 2012-10-20 DIAGNOSIS — IMO0002 Reserved for concepts with insufficient information to code with codable children: Secondary | ICD-10-CM | POA: Diagnosis present

## 2012-10-20 DIAGNOSIS — K319 Disease of stomach and duodenum, unspecified: Secondary | ICD-10-CM | POA: Diagnosis present

## 2012-10-20 DIAGNOSIS — Z79899 Other long term (current) drug therapy: Secondary | ICD-10-CM

## 2012-10-20 DIAGNOSIS — K922 Gastrointestinal hemorrhage, unspecified: Secondary | ICD-10-CM | POA: Diagnosis present

## 2012-10-20 DIAGNOSIS — Z992 Dependence on renal dialysis: Secondary | ICD-10-CM | POA: Diagnosis present

## 2012-10-20 DIAGNOSIS — K7290 Hepatic failure, unspecified without coma: Principal | ICD-10-CM

## 2012-10-20 DIAGNOSIS — Z87891 Personal history of nicotine dependence: Secondary | ICD-10-CM

## 2012-10-20 DIAGNOSIS — K7682 Hepatic encephalopathy: Principal | ICD-10-CM

## 2012-10-20 DIAGNOSIS — I120 Hypertensive chronic kidney disease with stage 5 chronic kidney disease or end stage renal disease: Secondary | ICD-10-CM | POA: Diagnosis present

## 2012-10-20 DIAGNOSIS — Z885 Allergy status to narcotic agent status: Secondary | ICD-10-CM

## 2012-10-20 DIAGNOSIS — K7030 Alcoholic cirrhosis of liver without ascites: Secondary | ICD-10-CM | POA: Diagnosis present

## 2012-10-20 DIAGNOSIS — K31811 Angiodysplasia of stomach and duodenum with bleeding: Secondary | ICD-10-CM | POA: Diagnosis present

## 2012-10-20 DIAGNOSIS — D62 Acute posthemorrhagic anemia: Secondary | ICD-10-CM | POA: Diagnosis present

## 2012-10-20 DIAGNOSIS — E119 Type 2 diabetes mellitus without complications: Secondary | ICD-10-CM

## 2012-10-20 DIAGNOSIS — F1020 Alcohol dependence, uncomplicated: Secondary | ICD-10-CM | POA: Diagnosis present

## 2012-10-20 DIAGNOSIS — M479 Spondylosis, unspecified: Secondary | ICD-10-CM | POA: Diagnosis present

## 2012-10-20 DIAGNOSIS — Z9115 Patient's noncompliance with renal dialysis: Secondary | ICD-10-CM

## 2012-10-20 DIAGNOSIS — E876 Hypokalemia: Secondary | ICD-10-CM | POA: Diagnosis present

## 2012-10-20 DIAGNOSIS — E1165 Type 2 diabetes mellitus with hyperglycemia: Secondary | ICD-10-CM | POA: Diagnosis present

## 2012-10-20 DIAGNOSIS — E669 Obesity, unspecified: Secondary | ICD-10-CM | POA: Diagnosis present

## 2012-10-20 DIAGNOSIS — E7220 Disorder of urea cycle metabolism, unspecified: Secondary | ICD-10-CM | POA: Diagnosis present

## 2012-10-20 DIAGNOSIS — D649 Anemia, unspecified: Secondary | ICD-10-CM

## 2012-10-20 DIAGNOSIS — N186 End stage renal disease: Secondary | ICD-10-CM

## 2012-10-20 DIAGNOSIS — N2581 Secondary hyperparathyroidism of renal origin: Secondary | ICD-10-CM | POA: Diagnosis present

## 2012-10-20 DIAGNOSIS — Z886 Allergy status to analgesic agent status: Secondary | ICD-10-CM

## 2012-10-20 LAB — CBC WITH DIFFERENTIAL/PLATELET
Basophils Absolute: 0 10*3/uL (ref 0.0–0.1)
Eosinophils Relative: 0 % (ref 0–5)
Lymphs Abs: 2 10*3/uL (ref 0.7–4.0)
MCH: 30.1 pg (ref 26.0–34.0)
Monocytes Absolute: 0.9 10*3/uL (ref 0.1–1.0)
Monocytes Relative: 9 % (ref 3–12)
Neutrophils Relative %: 71 % (ref 43–77)
Platelets: 171 10*3/uL (ref 150–400)
RBC: 2.26 MIL/uL — ABNORMAL LOW (ref 4.22–5.81)
RDW: 15 % (ref 11.5–15.5)
WBC: 9.9 10*3/uL (ref 4.0–10.5)

## 2012-10-20 LAB — APTT: aPTT: 33 seconds (ref 24–37)

## 2012-10-20 LAB — COMPREHENSIVE METABOLIC PANEL
ALT: 48 U/L (ref 0–53)
AST: 55 U/L — ABNORMAL HIGH (ref 0–37)
Albumin: 2 g/dL — ABNORMAL LOW (ref 3.5–5.2)
CO2: 24 mEq/L (ref 19–32)
Calcium: 8.5 mg/dL (ref 8.4–10.5)
Creatinine, Ser: 8.97 mg/dL — ABNORMAL HIGH (ref 0.50–1.35)
GFR calc non Af Amer: 6 mL/min — ABNORMAL LOW (ref >90)
Sodium: 133 mEq/L — ABNORMAL LOW (ref 135–145)

## 2012-10-20 LAB — PROTIME-INR
INR: 1.22 (ref 0.00–1.49)
Prothrombin Time: 15.2 seconds (ref 11.6–15.2)

## 2012-10-20 LAB — GLUCOSE, CAPILLARY: Glucose-Capillary: 244 mg/dL — ABNORMAL HIGH (ref 70–99)

## 2012-10-20 MED ORDER — SODIUM CHLORIDE 0.9 % IV SOLN
80.0000 mg | Freq: Two times a day (BID) | INTRAVENOUS | Status: DC
  Administered 2012-10-21 (×2): 80 mg via INTRAVENOUS
  Filled 2012-10-20 (×6): qty 80

## 2012-10-20 MED ORDER — LACTULOSE ENEMA
300.0000 mL | Freq: Once | ORAL | Status: AC
  Administered 2012-10-20: 300 mL via RECTAL
  Filled 2012-10-20: qty 300

## 2012-10-20 MED ORDER — SODIUM CHLORIDE 0.9 % IJ SOLN
3.0000 mL | Freq: Two times a day (BID) | INTRAMUSCULAR | Status: DC
  Administered 2012-10-21 – 2012-10-23 (×6): 3 mL via INTRAVENOUS

## 2012-10-20 MED ORDER — ONDANSETRON HCL 4 MG/2ML IJ SOLN
4.0000 mg | INTRAMUSCULAR | Status: DC | PRN
  Administered 2012-10-23 (×2): 4 mg via INTRAVENOUS
  Filled 2012-10-20 (×3): qty 2

## 2012-10-20 MED ORDER — SODIUM CHLORIDE 0.9 % IV SOLN
INTRAVENOUS | Status: AC
  Administered 2012-10-20: 23:00:00 via INTRAVENOUS

## 2012-10-20 MED ORDER — LACTULOSE ENEMA
300.0000 mL | ORAL | Status: DC
  Administered 2012-10-21 – 2012-10-22 (×6): 300 mL via RECTAL
  Filled 2012-10-20 (×15): qty 300

## 2012-10-20 NOTE — ED Provider Notes (Signed)
History     CSN: [id number]  Arrival date & time 10/20/12  1814   First MD Initiated Contact with Patient 10/20/12 1843      Chief Complaint  Patient presents with  . Fatigue  . Hyperglycemia     HPI Pt was seen at 1900.   Per pt, c/o gradual onset and persistence of constant generalized weakness and fatigue for the past 1 week.  Has been associated with generalized body aches.  Pt states he was admitted to [facility] 4 days ago for same, but left AMA 3 days ago.  EMS states pt's family reported pt's CBG's were "high" but EMS noted CBG of "315."  Denies fevers, no CP/SOB, no cough, no back pain, no abd pain, no N/V/D.     Past Medical History  Diagnosis Date  . Rectal bleeding   . Dialysis care   . GI bleed   . Hypertension   . Esophageal varices   . Alcoholic liver disease   . Hepatic encephalopathy   . Ascites   . Diabetes mellitus   . Detached retina     Past Surgical History  Procedure Laterality Date  . Esophagogastroduodenoscopy  12/31/2010    EGD [facility] THERAPY  . Esophagogastroduodenoscopy  05/31/2012E    GD [facility] ABLATION  . Small bowel givens  12/01/2010  . Colonoscopy  09/07/2010  . Esophagogastroduodenoscopy  09/05/2010    OUTLAW  . Lung surgery  6/11    [place]  . Esophagogastroduodenoscopy  03/26/2011    Procedure: ESOPHAGOGASTRODUODENOSCOPY (EGD);  Surgeon: Malissa Hippo, MD;  Location: [facility];  Service: Endoscopy;  Laterality: N/A;  8:30 am  . Hot hemostasis  03/26/2011    Procedure: HOT HEMOSTASIS (ARGON PLASMA COAGULATION/BICAP);  Surgeon: Malissa Hippo, MD;  Location: [facility];  Service: Endoscopy;  Laterality: N/A;  . Stent in liver       History  Substance Use Topics  . Smoking status: Former Smoker -- 0.50 packs/day    Types: Cigarettes    Quit date: 08/11/2011  . Smokeless tobacco: Never Used  . Alcohol Use: No      Review of Systems ROS: Statement: All systems negative except as marked or noted in the  HPI; Constitutional: Negative for fever and chills. +generalized weakness/fatigue, generalized body aches.; ; Eyes: Negative for eye pain, redness and discharge. ; ; ENMT: Negative for ear pain, hoarseness, nasal congestion, sinus pressure and sore throat. ; ; Cardiovascular: Negative for chest pain, palpitations, diaphoresis, dyspnea and peripheral edema. ; ; Respiratory: Negative for cough, wheezing and stridor. ; ; Gastrointestinal: Negative for nausea, vomiting, diarrhea, abdominal pain, blood in stool, hematemesis, jaundice and rectal bleeding. . ; ; Genitourinary: Negative for dysuria, flank pain and hematuria. ; ; Musculoskeletal: Negative for back pain and neck pain. Negative for swelling and trauma.; ; Skin: Negative for pruritus, rash, abrasions, blisters, bruising and skin lesion.; ; Neuro: +lethargy. Negative for headache, lightheadedness and neck stiffness. Negative for altered mental status, extremity weakness, paresthesias, involuntary movement, seizure and syncope.       Allergies  Tylenol and Morphine and related  Home Medications   Current Outpatient Rx  Name  Route  Sig  Dispense  Refill  . Bisacodyl (LAXATIVE PO)   Oral   Take 1 tablet by mouth daily as needed. For relief         . esomeprazole (NEXIUM) 40 MG capsule   Oral   Take 40 mg by mouth daily as needed. For  indigestion         . furosemide (LASIX) 80 MG tablet   Oral   Take 80 mg by mouth 2 (two) times daily.         [name] glipiZIDE (GLUCOTROL) 5 MG tablet   Oral   Take 10 mg by mouth 2 (two) times daily. At 6 am and 5 pm         . lactulose (CHRONULAC) 10 GM/15ML solution   Oral   Take 10 g by mouth 2 (two) times daily.          [name] lanthanum (FOSRENOL) 1000 MG chewable tablet   Oral   Chew 500-1,000 mg by mouth 3 (three) times daily with meals. Patient states that he chew 1 tablet with each meal , and 1/2 tablet with snack         . lidocaine-prilocaine (EMLA) cream   Topical   Apply 1  application topically as needed. Rub on arm PRIOR TO DIALYSIS on Mondays, Wednesdays, and Fridays.         . multivitamin (RENA-VIT) TABS tablet   Oral   Take 1 tablet by mouth daily.         . ondansetron (ZOFRAN) 4 MG tablet   Oral   Take 4 mg by mouth every 12 (twelve) hours as needed. FOR NAUSEA         . polyethylene glycol (GOLYTELY) 236 G solution   Oral   Take 4,000 mLs by mouth once.   4000 mL   0   . potassium chloride (K-DUR) 10 MEQ tablet   Oral   Take 30 mEq by mouth See admin instructions. Takes 30 meq post dialysis on Monday, Wednesday, and Friday.         . rifaximin (XIFAXAN) 550 MG TABS   Oral   Take 275 mg by mouth 2 (two) times daily. Patient takes 1/2 pill in the morning and 1/2 pill at night         . traMADol (ULTRAM) 50 MG tablet   Oral   Take 50 mg by mouth every 6 (six) hours as needed. For pain           BP 160/71  Pulse 84  Temp(Src) 98.1 F (36.7 C) (Oral)  Resp 12  Ht 6\' 3"  (1.905 m)  Wt 226 lb (102.513 kg)  BMI 28.25 kg/m2  SpO2 99%  Physical Exam 1905: Physical examination:  Nursing notes reviewed; Vital signs and O2 SAT reviewed;  Constitutional: Well developed, Well nourished, In no acute distress; Head:  Normocephalic, atraumatic; Eyes: EOMI, PERRL, No scleral icterus; ENMT: Mouth and pharynx normal, Mucous membranes dry; Neck: Supple, Full range of motion, No lymphadenopathy; Cardiovascular: Regular rate and rhythm, No gallop; Respiratory: Breath sounds clear & equal bilaterally, No wheezes.  Speaking full sentences with ease, Normal respiratory effort/excursion; Chest: Nontender, Movement normal; Abdomen: Soft, Nontender, Nondistended, Normal bowel sounds.+PD catheter without drainage. Rectal exam performed w/permission of pt and ED RN chaperone present.  Anal tone normal.  Non-tender, soft brown stool in rectal vault, heme positive.  No fissures, no external hemorrhoids, no palp masses.;; Extremities: +left wrist AV fistula  with palp thrill. Pulses normal, No tenderness, No edema, No calf edema or asymmetry.; Neuro: Lethargic, but wakes to his name, AA&Ox3, Major CN grossly intact. No facial droop. Speech clear. No gross focal motor or sensory deficits in extremities.; Skin: Color normal, Warm, Dry.   ED Course  Procedures     MDM  MDM  Reviewed: previous chart, nursing note and vitals Reviewed previous: labs Interpretation: labs, ECG and x-ray    Date: 10/20/2012  Rate: 90  Rhythm: normal sinus rhythm  QRS Axis: normal  Intervals: normal  ST/T Wave abnormalities: normal  Conduction Disutrbances:none  Narrative Interpretation:   Old EKG Reviewed: none available.  Results for orders placed during the hospital encounter of 10/20/12  CBC WITH DIFFERENTIAL      Result Value Range   WBC 9.9  4.0 - 10.5 K/uL   RBC 2.26 (*) 4.22 - 5.81 MIL/uL   Hemoglobin 6.8 (*) 13.0 - 17.0 g/dL   HCT 16.1 (*) 09.6 - 04.5 %   MCV 87.6  78.0 - 100.0 fL   MCH 30.1  26.0 - 34.0 pg   MCHC 34.3  30.0 - 36.0 g/dL   RDW 40.9  81.1 - 91.4 %   Platelets 171  150 - 400 K/uL   Neutrophils Relative 71  43 - 77 %   Lymphocytes Relative 20  12 - 46 %   Monocytes Relative 9  3 - 12 %   Eosinophils Relative 0  0 - 5 %   Basophils Relative 0  0 - 1 %   Neutro Abs 7.0  1.7 - 7.7 K/uL   Lymphs Abs 2.0  0.7 - 4.0 K/uL   Monocytes Absolute 0.9  0.1 - 1.0 K/uL   Eosinophils Absolute 0.0  0.0 - 0.7 K/uL   Basophils Absolute 0.0  0.0 - 0.1 K/uL   RBC Morphology POLYCHROMASIA PRESENT    COMPREHENSIVE METABOLIC PANEL      Result Value Range   Sodium 133 (*) 135 - 145 mEq/L   Potassium 3.7  3.5 - 5.1 mEq/L   Chloride 92 (*) 96 - 112 mEq/L   CO2 24  19 - 32 mEq/L   Glucose, Bld 238 (*) 70 - 99 mg/dL   BUN 99 (*) 6 - 23 mg/dL   Creatinine, Ser 7.82 (*) 0.50 - 1.35 mg/dL   Calcium 8.5  8.4 - 95.6 mg/dL   Total Protein 5.8 (*) 6.0 - 8.3 g/dL   Albumin 2.0 (*) 3.5 - 5.2 g/dL   AST 55 (*) 0 - 37 U/L   ALT 48  0 - 53 U/L   Alkaline  Phosphatase 147 (*) 39 - 117 U/L   Total Bilirubin 0.5  0.3 - 1.2 mg/dL   GFR calc non Af Amer 6 (*) >90 mL/min   GFR calc Af Amer 6 (*) >90 mL/min  AMMONIA      Result Value Range   Ammonia 146 (*) 11 - 60 umol/L  PROTIME-INR      Result Value Range   Prothrombin Time 15.2  11.6 - 15.2 seconds   INR 1.22  0.00 - 1.49  APTT      Result Value Range   aPTT 33  24 - 37 seconds  GLUCOSE, CAPILLARY      Result Value Range   Glucose-Capillary 244 (*) 70 - 99 mg/dL   Dg Chest Portable 1 View 10/20/2012  *RADIOLOGY REPORT*  Clinical Data: Generalized weakness.  Shortness of breath. Hyperglycemia.  PORTABLE CHEST - 1 VIEW 10/20/2012 1852 hours:  Comparison: One-view chest x-ray 09/18/2012.  Two-view chest x-ray 08/16/2012.  Portable chest x-ray 10/11/2011.  Findings: Cardiac silhouette enlarged but stable.  Thoracic aorta atherosclerotic, unchanged.  Hilar and mediastinal contours otherwise unremarkable.  Emphysematous changes in the upper lobes, left greater than right, unchanged.  Pleuroparenchymal scarring at the right base,  unchanged.  No new pulmonary parenchymal abnormalities.  IMPRESSION: Stable cardiomegaly.  Stable COPD/emphysema and pleuroparenchymal scarring at the right base.  No acute cardiopulmonary disease.   Original Report Authenticated By: Hulan Saas, M.D.     Results for WYATT, THORSTENSON (MRN [id number]) as of 10/20/2012 20:25  Ref. Range 03/25/2011 07:39 09/18/2012 15:35 10/20/2012 19:15  BUN Latest Range: 6-23 mg/dL 18 39 (H) 99 (H)  Creatinine Latest Range: 0.50-1.35 mg/dL 4.09 (H) 8.11 (H) 9.14 (H)    Results for AGUSTINE, ROSSITTO (MRN [id number]) as of 10/20/2012 20:25  Ref. Range 03/04/2011 11:39 03/24/2011 18:10 03/25/2011 07:39 09/18/2012 15:35 10/20/2012 19:15  Hemoglobin Latest Range: 13.0-17.0 g/dL 7.3 (L) 6.0 (LL) 7.9 (L) 10.6 (L) 6.8 (LL)  HCT Latest Range: 39.0-52.0 % 24.2 (L) 19.4 (L) 24.1 (L) 32.0 (L) 19.8 (L)     Results for JAHKEEM, KURKA (MRN [id number]) as of 10/20/2012  20:25  Ref. Range 09/16/2010 15:04 12/01/2010 02:00 02/22/2011 15:03 09/18/2012 15:41 10/20/2012 19:10  Ammonia Latest Range: 11-60 umol/L 77 (H) 31 102 (H) 66 (H) 146 (H)    2040:  Pt continues lethargic, arousable to name.  Stable VS, easy resps, NAD.  Endorses he has not been taking his lactulose for "a while."  Will dose lactulose via enema. [facility] records from his admit pending receipt.  Dx and testing d/w pt.  Questions answered.  Verb understanding, agreeable to admit. T/C to [facility]. Orvan Falconer, case discussed, including:  HPI, pertinent PM/SHx, VS/PE, dx testing, ED course and treatment:  Agreeable to admit, requests to write temporary orders, obtain tele bed.     Laray Anger, DO 10/22/12 1341

## 2012-10-20 NOTE — ED Notes (Signed)
CRITICAL VALUE ALERT  Critical value received:  Hem 6.8  Date of notification:  10/20/12  Time of notification:  *1948  Critical value read backyes  Nurse who received alert:  P.Dae Antonucci,rn  MD notified (1st page):  [email or web address]  Time of first page: 1949  MD notified (2nd page):  Time of second page:  Responding MD:  mcmanus  Time MD responded:  1949

## 2012-10-20 NOTE — ED Notes (Signed)
Patient brought in via EMS. Lethargic but aroused. Patient alert to person, place, and situation-confused to time. Patient seen and admitted at [facility] on Monday for abd pain left AMA on Tuesday. Patient c/o generalized pain and weakness. Patient has multiple health issues-refer to medical hx. Per family patient's blood sugars elevated. Per EMS blood sugar 315. 12 lead normal sinus rhythm per EMS.

## 2012-10-21 DIAGNOSIS — D649 Anemia, unspecified: Secondary | ICD-10-CM

## 2012-10-21 DIAGNOSIS — R4182 Altered mental status, unspecified: Secondary | ICD-10-CM

## 2012-10-21 LAB — COMPREHENSIVE METABOLIC PANEL
ALT: 50 U/L (ref 0–53)
Alkaline Phosphatase: 117 U/L (ref 39–117)
CO2: 25 mEq/L (ref 19–32)
Calcium: 8.6 mg/dL (ref 8.4–10.5)
GFR calc Af Amer: 6 mL/min — ABNORMAL LOW (ref >90)
GFR calc non Af Amer: 5 mL/min — ABNORMAL LOW (ref >90)
Glucose, Bld: 112 mg/dL — ABNORMAL HIGH (ref 70–99)
Potassium: 3.4 mEq/L — ABNORMAL LOW (ref 3.5–5.1)
Sodium: 134 mEq/L — ABNORMAL LOW (ref 135–145)

## 2012-10-21 LAB — OCCULT BLOOD X 1 CARD TO LAB, STOOL: Fecal Occult Bld: POSITIVE — AB

## 2012-10-21 LAB — CBC
MCH: 31.8 pg (ref 26.0–34.0)
RBC: 2.83 MIL/uL — ABNORMAL LOW (ref 4.22–5.81)
WBC: 9.5 10*3/uL (ref 4.0–10.5)

## 2012-10-21 LAB — GLUCOSE, CAPILLARY
Glucose-Capillary: 208 mg/dL — ABNORMAL HIGH (ref 70–99)
Glucose-Capillary: 113 mg/dL — ABNORMAL HIGH (ref 70–99)

## 2012-10-21 MED ORDER — SODIUM CHLORIDE 0.9 % IV SOLN: 100.0000 mL | INTRAVENOUS | Status: DC | PRN

## 2012-10-21 MED ORDER — DEXTROSE 5 % IV SOLN
2.0000 g | Freq: Every day | INTRAVENOUS | Status: DC
  Administered 2012-10-21 – 2012-10-23 (×3): 2 g via INTRAVENOUS
  Filled 2012-10-21 (×6): qty 2

## 2012-10-21 MED ORDER — PANTOPRAZOLE SODIUM 40 MG IV SOLR
INTRAVENOUS | Status: AC
  Filled 2012-10-21: qty 80

## 2012-10-21 MED ORDER — LIDOCAINE HCL (PF) 1 % IJ SOLN
5.0000 mL | INTRAMUSCULAR | Status: DC | PRN
  Filled 2012-10-21: qty 5

## 2012-10-21 MED ORDER — INSULIN ASPART 100 UNIT/ML ~~LOC~~ SOLN
0.0000 [IU] | SUBCUTANEOUS | Status: DC
  Administered 2012-10-21 (×2): 1 [IU] via SUBCUTANEOUS
  Administered 2012-10-21: 3 [IU] via SUBCUTANEOUS
  Administered 2012-10-22 (×2): 5 [IU] via SUBCUTANEOUS
  Administered 2012-10-22: 1 [IU] via SUBCUTANEOUS
  Administered 2012-10-23: 7 [IU] via SUBCUTANEOUS
  Administered 2012-10-23 (×3): 3 [IU] via SUBCUTANEOUS
  Administered 2012-10-23: 7 [IU] via SUBCUTANEOUS
  Administered 2012-10-23: 1 [IU] via SUBCUTANEOUS
  Administered 2012-10-24: 5 [IU] via SUBCUTANEOUS
  Administered 2012-10-24 (×2): 2 [IU] via SUBCUTANEOUS

## 2012-10-21 MED ORDER — HEPARIN SODIUM (PORCINE) 1000 UNIT/ML DIALYSIS
1000.0000 [IU] | INTRAMUSCULAR | Status: DC | PRN
  Filled 2012-10-21: qty 1

## 2012-10-21 MED ORDER — PANTOPRAZOLE SODIUM 40 MG IV SOLR
40.0000 mg | Freq: Two times a day (BID) | INTRAVENOUS | Status: DC
  Administered 2012-10-21 – 2012-10-22 (×2): 40 mg via INTRAVENOUS
  Filled 2012-10-21 (×3): qty 40

## 2012-10-21 MED ORDER — METOPROLOL TARTRATE 25 MG PO TABS
25.0000 mg | ORAL_TABLET | Freq: Two times a day (BID) | ORAL | Status: DC
  Administered 2012-10-21 – 2012-10-23 (×5): 25 mg via ORAL
  Filled 2012-10-21 (×6): qty 1

## 2012-10-21 MED ORDER — NEPRO/CARBSTEADY PO LIQD
237.0000 mL | ORAL | Status: DC | PRN
  Filled 2012-10-21: qty 237

## 2012-10-21 MED ORDER — LIDOCAINE-PRILOCAINE 2.5-2.5 % EX CREA: 1.0000 "application " | TOPICAL_CREAM | CUTANEOUS | Status: DC | PRN

## 2012-10-21 MED ORDER — PENTAFLUOROPROP-TETRAFLUOROETH EX AERO
1.0000 "application " | INHALATION_SPRAY | CUTANEOUS | Status: DC | PRN
  Filled 2012-10-21: qty 103.5

## 2012-10-21 MED ORDER — ALTEPLASE 2 MG IJ SOLR
2.0000 mg | Freq: Once | INTRAMUSCULAR | Status: DC | PRN
  Filled 2012-10-21: qty 2

## 2012-10-21 NOTE — Progress Notes (Signed)
[facility] PROGRESS NOTE  Timothy Chan ZOX:[id number] DOB: [birth year] DOA: 10/20/2012 PCP: Ignatius Specking., MD  Assessment/Plan: 1. Hepatic encephalopathy. Continue lactulose enemas. 2. Abdominal pain. Concern for underlying SBP. Started on cefotaxime per GI and peritoneal fluid has been ordered for analysis. 3. Anemia. Unclear if this is related to GI bleeding. Will monitor for any melena. He is status post 2 units of PRBCs with improvement in hemoglobin. GI is following. Fecal occult blood is positive. Continue proton pump inhibitors for. 4. End-stage renal disease on peritoneal dialysis. Patient is undergoing hemodialysis while here in the hospital. Not entirely clear his uremia is from GI bleeding versus worsening renal failure. 5. Diabetes, blood sugars are stable. 6. Hypertension. Blood pressures running high. We'll restart beta blockers.  Code Status: Full code Family Communication: No family at bedside Disposition Plan: To be determined   Consultants:  Nephrology,  Gastroenterology  Procedures:  Hemodialysis  Antibiotics:  Cefotaxime 4/12  HPI/Subjective: Lethargic, opens eyes and answers simple questions but falls back asleep. Does not voice any complaints.  Objective: Filed Vitals:   10/21/12 0844 10/21/12 0942 10/21/12 1000 10/21/12 1403  BP: 179/86 179/80 180/79 155/75  Pulse: 81 79 78 85  Temp: 97.1 F (36.2 C) 97.3 F (36.3 C) 97.3 F (36.3 C) 97 F (36.1 C)  TempSrc: Oral Oral Oral Axillary  Resp: 12 16 12 16   Height:      Weight:      SpO2:    100%    Intake/Output Summary (Last 24 hours) at 10/21/12 1625 Last data filed at 10/21/12 1238  Gross per 24 hour  Intake 793.17 ml  Output      0 ml  Net 793.17 ml   Filed Weights   10/20/12 1844 10/20/12 2308 10/21/12 0446  Weight: 102.513 kg (226 lb) 103 kg (227 lb 1.2 oz) 102.9 kg (226 lb 13.7 oz)    Exam:   General:  Patient is lethargic, briefly open his eyes and falls back  asleep  Cardiovascular: S1, S2, regular rate and rhythm  Respiratory: Clear to auscultation bilaterally  Abdomen: Soft, distended, diffusely tender  Musculoskeletal: No lower extremity edema   Data Reviewed: Basic Metabolic Panel:  Recent Labs Lab 10/20/12 1915 10/21/12 1100  NA 133* 134*  K 3.7 3.4*  CL 92* 93*  CO2 24 25  GLUCOSE 238* 112*  BUN 99* 108*  CREATININE 8.97* 9.38*  CALCIUM 8.5 8.6  MG  --  2.4   Liver Function Tests:  Recent Labs Lab 10/20/12 1915 10/21/12 1100  AST 55* 51*  ALT 48 50  ALKPHOS 147* 117  BILITOT 0.5 0.9  PROT 5.8* 5.6*  ALBUMIN 2.0* 2.0*   No results found for this basename: LIPASE, AMYLASE,  in the last 168 hours  Recent Labs Lab 10/20/12 1910 10/21/12 1100  AMMONIA 146* 79*   CBC:  Recent Labs Lab 10/20/12 1915 10/21/12 1100  WBC 9.9 9.5  NEUTROABS 7.0  --   HGB 6.8* 9.0*  HCT 19.8* 24.8*  MCV 87.6 87.6  PLT 171 160   Cardiac Enzymes: No results found for this basename: CKTOTAL, CKMB, CKMBINDEX, TROPONINI,  in the last 168 hours BNP (last 3 results) No results found for this basename: PROBNP,  in the last 8760 hours CBG:  Recent Labs Lab 10/20/12 1834 10/21/12 0021 10/21/12 0427 10/21/12 0742 10/21/12 1157  GLUCAP 244* 208* 143* 126* 113*    No results found for this or any previous visit (from the past 240 hour(s)).  Studies: Dg Chest Portable 1 View  10/20/2012  *RADIOLOGY REPORT*  Clinical Data: Generalized weakness.  Shortness of breath. Hyperglycemia.  PORTABLE CHEST - 1 VIEW 10/20/2012 1852 hours:  Comparison: One-view chest x-ray 09/18/2012.  Two-view chest x-ray 08/16/2012.  Portable chest x-ray 10/11/2011.  Findings: Cardiac silhouette enlarged but stable.  Thoracic aorta atherosclerotic, unchanged.  Hilar and mediastinal contours otherwise unremarkable.  Emphysematous changes in the upper lobes, left greater than right, unchanged.  Pleuroparenchymal scarring at the right base, unchanged.  No  new pulmonary parenchymal abnormalities.  IMPRESSION: Stable cardiomegaly.  Stable COPD/emphysema and pleuroparenchymal scarring at the right base.  No acute cardiopulmonary disease.   Original Report Authenticated By: Hulan Saas, M.D.     Scheduled Meds: . cefoTAXime (CLAFORAN) IV  2 g Intravenous Q1200  . insulin aspart  0-9 Units Subcutaneous Q4H  . lactulose  300 mL Rectal Q4H  . pantoprazole (PROTONIX) IV  40 mg Intravenous Q12H  . sodium chloride  3 mL Intravenous Q12H   Continuous Infusions:   Principal Problem:   Hepatic encephalopathy Active Problems:   Alcoholic cirrhosis   Diabetes mellitus   End stage renal disease   ESRD on peritoneal dialysis   Anemia   Hyperammonemia   Altered mental status    Time spent:    [facility]  [facility] Pager [phone]. If 7PM-7AM, please contact night-coverage at [email or web address], password [facility] 10/21/2012, 4:25 PM  LOS: 1 day

## 2012-10-21 NOTE — Consult Note (Signed)
Reason for Consult: End-stage renal disease referring Physician: Dr. Barbette Merino Timothy Chan is an 62 y.o. male.  HPI:  he patient with history of liver cirrhosis, end-stage renal disease on maintenance hemodialysis presently came because of altered mental status and high ammonia. [facility] was [facility] because [facility]. During workup patient was found to have possible pneumonia versus questionable colitis. Patient was put on antibiotics and discharged home. During that time PD fluid was clear and no bacteria. Patient presently denies any difficulty breathing.  Past Medical History  Diagnosis Date  . Rectal bleeding   . Dialysis care   . GI bleed   . Hypertension   . Esophageal varices   . Alcoholic liver disease   . Hepatic encephalopathy   . Ascites   . Diabetes mellitus   . Detached retina     Past Surgical History  Procedure Laterality Date  . Esophagogastroduodenoscopy  12/31/2010    EGD [facility] THERAPY  . Esophagogastroduodenoscopy  05/31/2012E    GD [facility] ABLATION  . Small bowel givens  12/01/2010  . Colonoscopy  09/07/2010  . Esophagogastroduodenoscopy  09/05/2010    OUTLAW  . Lung surgery  6/11    [place]  . Esophagogastroduodenoscopy  03/26/2011    Procedure: ESOPHAGOGASTRODUODENOSCOPY (EGD);  Surgeon: Malissa Hippo, MD;  [facility];  Service: Endoscopy;  Laterality: N/A;  8:30 am  . Hot hemostasis  03/26/2011    Procedure: HOT HEMOSTASIS (ARGON PLASMA COAGULATION/BICAP);  Surgeon: Malissa Hippo, MD;  Location[facility];  Service: Endoscopy;  Laterality: N/A;  . Stent in liver      History reviewed. No pertinent family history.  Social History:  reports that he quit smoking about 14 months ago. His smoking use included Cigarettes. He smoked 0.50 packs per day. He quit smokeless tobacco use about 5 months ago. He reports that he does not drink alcohol or use illicit drugs.  Allergies:  Allergies  Allergen  Reactions  . Tylenol (Acetaminophen) Other (See Comments)    cirrhosis  . Morphine And Related Itching    Medications: I have reviewed the patient's current medications.  Results for orders placed during the hospital encounter of 10/20/12 (from the past 48 hour(s))  GLUCOSE, CAPILLARY     Status: Abnormal   Collection Time    10/20/12  6:34 PM      Result Value Range   Glucose-Capillary 244 (*) 70 - 99 mg/dL  AMMONIA     Status: Abnormal   Collection Time    10/20/12  7:10 PM      Result Value Range   Ammonia 146 (*) 11 - 60 umol/L  CBC WITH DIFFERENTIAL     Status: Abnormal   Collection Time    10/20/12  7:15 PM      Result Value Range   WBC 9.9  4.0 - 10.5 K/uL   RBC 2.26 (*) 4.22 - 5.81 MIL/uL   Hemoglobin 6.8 (*) 13.0 - 17.0 g/dL   Comment: RESULT REPEATED AND VERIFIED     CRITICAL RESULT CALLED TO, READ BACK BY AND VERIFIED WITH:     P. WATKINS AT 1947 ON 10/20/12 BY S. VANHOORNE   HCT 19.8 (*) 39.0 - 52.0 %   MCV 87.6  78.0 - 100.0 fL   MCH 30.1  26.0 - 34.0 pg   MCHC 34.3  30.0 - 36.0 g/dL   RDW 16.1  09.6 - 04.5 %   Platelets 171  150 - 400 K/uL   Neutrophils Relative 71  43 - 77 %   Lymphocytes Relative 20  12 - 46 %   Monocytes Relative 9  3 - 12 %   Eosinophils Relative 0  0 - 5 %   Basophils Relative 0  0 - 1 %   Neutro Abs 7.0  1.7 - 7.7 K/uL   Lymphs Abs 2.0  0.7 - 4.0 K/uL   Monocytes Absolute 0.9  0.1 - 1.0 K/uL   Eosinophils Absolute 0.0  0.0 - 0.7 K/uL   Basophils Absolute 0.0  0.0 - 0.1 K/uL   RBC Morphology POLYCHROMASIA PRESENT     Comment: SCHISTOCYTES PRESENT (2-5/hpf)     ELLIPTOCYTES  COMPREHENSIVE METABOLIC PANEL     Status: Abnormal   Collection Time    10/20/12  7:15 PM      Result Value Range   Sodium 133 (*) 135 - 145 mEq/L   Potassium 3.7  3.5 - 5.1 mEq/L   Chloride 92 (*) 96 - 112 mEq/L   CO2 24  19 - 32 mEq/L   Glucose, Bld 238 (*) 70 - 99 mg/dL   BUN 99 (*) 6 - 23 mg/dL   Creatinine, Ser 4.54 (*) 0.50 - 1.35 mg/dL   Calcium  8.5  8.4 - 09.8 mg/dL   Total Protein 5.8 (*) 6.0 - 8.3 g/dL   Albumin 2.0 (*) 3.5 - 5.2 g/dL   AST 55 (*) 0 - 37 U/L   ALT 48  0 - 53 U/L   Alkaline Phosphatase 147 (*) 39 - 117 U/L   Total Bilirubin 0.5  0.3 - 1.2 mg/dL   GFR calc non Af Amer 6 (*) >90 mL/min   GFR calc Af Amer 6 (*) >90 mL/min   Comment:            The eGFR has been calculated     using the CKD EPI equation.     This calculation has not been     validated in all clinical     situations.     eGFR's persistently     <90 mL/min signify     possible Chronic Kidney Disease.  PROTIME-INR     Status: None   Collection Time    10/20/12  7:45 PM      Result Value Range   Prothrombin Time 15.2  11.6 - 15.2 seconds   INR 1.22  0.00 - 1.49  APTT     Status: None   Collection Time    10/20/12  7:45 PM      Result Value Range   aPTT 33  24 - 37 seconds  TYPE AND SCREEN     Status: None   Collection Time    10/20/12  8:13 PM      Result Value Range   ABO/RH(D) B NEG     Antibody Screen NEG     Sample Expiration 10/23/2012     Unit Number [id number]     Blood Component Type RED CELLS,LR     Unit division 00     Status of Unit ISSUED     Transfusion Status OK TO TRANSFUSE     Crossmatch Result Compatible     Unit Number [id number]     Blood Component Type RED CELLS,LR     Unit division 00     Status of Unit ISSUED     Transfusion Status OK TO TRANSFUSE     Crossmatch Result Compatible  PREPARE RBC (CROSSMATCH)     Status: None   Collection Time    10/20/12  8:13 PM      Result Value Range   Order Confirmation ORDER PROCESSED BY BLOOD BANK    GLUCOSE, CAPILLARY     Status: Abnormal   Collection Time    10/21/12 12:21 AM      Result Value Range   Glucose-Capillary 208 (*) 70 - 99 mg/dL   Comment 1 Notify RN    GLUCOSE, CAPILLARY     Status: Abnormal   Collection Time    10/21/12  4:27 AM      Result Value Range   Glucose-Capillary 143 (*) 70 - 99 mg/dL   Comment 1 Notify RN    OCCULT BLOOD X 1  CARD TO LAB, STOOL     Status: Abnormal   Collection Time    10/21/12  6:41 AM      Result Value Range   Fecal Occult Bld POSITIVE (*) NEGATIVE  GLUCOSE, CAPILLARY     Status: Abnormal   Collection Time    10/21/12  7:42 AM      Result Value Range   Glucose-Capillary 126 (*) 70 - 99 mg/dL   Comment 1 Notify RN      Dg Chest Portable 1 View  10/20/2012  *RADIOLOGY REPORT*  Clinical Data: Generalized weakness.  Shortness of breath. Hyperglycemia.  PORTABLE CHEST - 1 VIEW 10/20/2012 1852 hours:  Comparison: One-view chest x-ray 09/18/2012.  Two-view chest x-ray 08/16/2012.  Portable chest x-ray 10/11/2011.  Findings: Cardiac silhouette enlarged but stable.  Thoracic aorta atherosclerotic, unchanged.  Hilar and mediastinal contours otherwise unremarkable.  Emphysematous changes in the upper lobes, left greater than right, unchanged.  Pleuroparenchymal scarring at the right base, unchanged.  No new pulmonary parenchymal abnormalities.  IMPRESSION: Stable cardiomegaly.  Stable COPD/emphysema and pleuroparenchymal scarring at the right base.  No acute cardiopulmonary disease.   Original Report Authenticated By: Hulan Saas, M.D.     Review of Systems  Respiratory: Negative for cough, hemoptysis and shortness of breath.   Gastrointestinal: Positive for vomiting and abdominal pain.  Neurological: Positive for weakness.  Psychiatric/Behavioral: Positive for memory loss.   Blood pressure 179/86, pulse 81, temperature 97.1 F (36.2 C), temperature source Oral, resp. rate 12, height 6\' 3"  (1.905 m), weight 102.9 kg (226 lb 13.7 oz), SpO2 100.00%. Physical Exam  Eyes: No scleral icterus.  Neck: No JVD present.  Cardiovascular: Normal rate and regular rhythm.   No murmur heard. Respiratory: He has wheezes.  GI: He exhibits distension. There is tenderness. There is no rebound.  Musculoskeletal: He exhibits no edema.  Neurological:  Sleepy but arousable. Patient states that he has his dialysis  until yesterday.    Assessment/Plan: Problem #1 altered mental status is secondary to hepatic encephalopathy. Mostly patient is noncompliant with his medication not sure whether he has been taking his lactulose. His ammonia level is very high. Problem #2 end-stage renal disease he is started on peritoneal dialysis presently his BUN and creatinine is also high. Patient is still that he's doing his PD was suggested. At this moment or sure Y. his BUN is very high. Problem #3 anemia patient with previous history of GI bleeding his hemoglobin and hematocrit is low. Possibly he might have bleeding patient has this moment   said he doesn't have any bleeding. Problem #4 hypertension blood pressure seems to be somewhat high Problem #5 he he diabetes Problem #6 history of abdominal pain long-standing. Patient  has been complaining for many months. Since he has a catheter also has intermittent abdominal pain. Patient insisted he to do paternal dialysis in spite of that. Patient will was non-compliant with his hemodialysis. Patient his time and also at times with dialysis for different reasons.  Plan: We'll make arrangements for patient to get dialysis today We'll use 4k/2.5 calcium bath. We'll check his basic metabolic panel and CBC and phosphorus in the morning. [email or web address],Briton Sellman S  10/21/2012, 9:28 AM

## 2012-10-21 NOTE — H&P (Signed)
[facility] History and Physical  RODDY Chan  UJW:[id number]  DOB: 08-28-1950   DOA: 10/20/2012   PCP:   Ignatius Specking., MD  Nephrologist: Dr. Alferd Apa M.D.  History compromised by patient's mental status changes  Chief Complaint:  Confusion for days   HPI: Timothy Chan is an 62 y.o. male.  Multiple medical problems including diabetes, end-stage renal disease on daily peritoneal dialysis, liver cirrhosis status post TIPS, recurrent episodes of hepatic encephalopathy maintained on lactulose,  and Xifaxan, baseline hemoglobin greater than 10. He is "frequent flyer"at our emergency room but very often leaves AMA. He was admitted to [facility] April 7 for abdominal pain and anemia, focal colitis noted on CAT scan. He was seen by nephrologist and gastroenterologist did not feel that the colitis was significant cause of his symptoms, but left AMA the following day. He now presents to our emergency room with progressive weakness and confusion and hospitalist service was called to assist.  A 4 point drop is noted in his hemoglobin over the past month; his ammonia level is elevated above baseline.  At the time of evaluation patient is oriented only to himself, and believes he is at [facility]. He is unable to give me any reliable history, although the nurse informs me that he was able to consent to blood transfusion.  Rewiew of Systems:  Unable to obtain; but no history of vomiting or diarrhea was elicited; no passage of blood.    Past Medical History  Diagnosis Date  . Rectal bleeding   . Dialysis care   . GI bleed   . Hypertension   . Esophageal varices   . Alcoholic liver disease   . Hepatic encephalopathy   . Ascites   . Diabetes mellitus   . Detached retina     Past Surgical History  Procedure Laterality Date  . Esophagogastroduodenoscopy  12/31/2010    EGD [facility] THERAPY  . Esophagogastroduodenoscopy  05/31/2012E    GD [facility] ABLATION  . Small bowel  givens  12/01/2010  . Colonoscopy  09/07/2010  . Esophagogastroduodenoscopy  09/05/2010    OUTLAW  . Lung surgery  6/11    [place]  . Esophagogastroduodenoscopy  03/26/2011    Procedure: ESOPHAGOGASTRODUODENOSCOPY (EGD);  Surgeon: Malissa Hippo, MD;  [facility];  Service: Endoscopy;  Laterality: N/A;  8:30 am  . Hot hemostasis  03/26/2011    Procedure: HOT HEMOSTASIS (ARGON PLASMA COAGULATION/BICAP);  Surgeon: Malissa Hippo, MD;  [facility];  Service: Endoscopy;  Laterality: N/A;  . Stent in liver      Medications:  HOME MEDS: Prior to Admission medications   Medication Sig Start Date End Date Taking? Authorizing Provider  Bisacodyl (LAXATIVE PO) Take 1 tablet by mouth daily as needed. For relief   Yes Historical Provider, MD  esomeprazole (NEXIUM) 40 MG capsule Take 40 mg by mouth daily as needed. For indigestion   Yes Historical Provider, MD  furosemide (LASIX) 80 MG tablet Take 80 mg by mouth 2 (two) times daily.   Yes Historical Provider, MD  glipiZIDE (GLUCOTROL) 5 MG tablet Take 10 mg by mouth 2 (two) times daily. At 6 am and 5 pm   Yes Historical Provider, MD  lactulose (CHRONULAC) 10 GM/15ML solution Take 10 g by mouth 2 (two) times daily.  04/10/12  Yes Malissa Hippo, MD  lanthanum (FOSRENOL) 1000 MG chewable tablet Chew 500-1,000 mg by mouth 3 (three) times daily with meals. Patient states that he  chew 1 tablet with each meal , and 1/2 tablet with snack   Yes Historical Provider, MD  multivitamin (RENA-VIT) TABS tablet Take 1 tablet by mouth daily.   Yes Historical Provider, MD  ondansetron (ZOFRAN) 4 MG tablet Take 4 mg by mouth every 12 (twelve) hours as needed. FOR NAUSEA   Yes Historical Provider, MD  potassium chloride (K-DUR) 10 MEQ tablet Take 30 mEq by mouth See admin instructions. Takes 30 meq post dialysis on Monday, Wednesday, and Friday.   Yes Historical Provider, MD  rifaximin (XIFAXAN) 550 MG TABS Take 275 mg by mouth 2 (two)  times daily. Patient takes 1/2 pill in the morning and 1/2 pill at night 04/10/12  Yes Malissa Hippo, MD  traMADol (ULTRAM) 50 MG tablet Take 50 mg by mouth every 6 (six) hours as needed. For pain   Yes Historical Provider, MD  lidocaine-prilocaine (EMLA) cream Apply 1 application topically as needed. Rub on arm PRIOR TO DIALYSIS on Mondays, Wednesdays, and Fridays.    Historical Provider, MD     Allergies:  Allergies  Allergen Reactions  . Tylenol (Acetaminophen) Other (See Comments)    cirrhosis  . Morphine And Related Itching    Social History:   reports that he quit smoking about 14 months ago. His smoking use included Cigarettes. He smoked 0.50 packs per day. He quit smokeless tobacco use about 5 months ago. He reports that he does not drink alcohol or use illicit drugs.  Family History: History reviewed. No pertinent family history.  unable to obtain due to patient's mental status  Physical Exam: Filed Vitals:   10/20/12 2112 10/20/12 2125 10/20/12 2200 10/20/12 2308  BP: 150/69  151/67 177/71  Pulse: 91  85 87  Temp:  97.5 F (36.4 C)  97.2 F (36.2 C)  TempSrc:  Oral  Oral  Resp: 17  22 20   Height:    6\' 3"  (1.905 m)  Weight:    103 kg (227 lb 1.2 oz)  SpO2: 100%   100%   Blood pressure 177/71, pulse 87, temperature 97.2 F (36.2 C), temperature source Oral, resp. rate 20, height 6\' 3"  (1.905 m), weight 103 kg (227 lb 1.2 oz), SpO2 100.00%.  GEN:  Confused, obese middle-aged Caucasian gentleman  lying bed in no acute distress;  unable to becooperative with exam PSYCH:  alert and oriented x1;   HEENT: Mucous membranes pale and icteric; PERRLA; EOM intact; thick neck Breasts:: Not examined CHEST WALL: No tenderness CHEST: Normal respiration, clear to auscultation bilaterally HEART: Regular rate and rhythm; 3/6 systolic murmur no rubs or gallops  BACK: No kyphosis no scoliosis; no CVA tenderness ABDOMEN: Obese, soft non-tender; no masses, no organomegaly  appreciated , normal abdominal bowel sounds; no intertriginous candida. Rectal Exam: Not done EXTREMITIES:  age-appropriate arthropathy of the hands and knees; no edema; no ulcerations. Genitalia: not examined PULSES: 2+ and symmetric SKIN: Normal hydration no rash or ulceration; ecchymoses from recent injections noted  CNS: Cranial nerves 2-12 grossly intact no focal lateralizing neurologic deficit   Labs on Admission:  Basic Metabolic Panel:  Recent Labs Lab 10/20/12 1915  NA 133*  K 3.7  CL 92*  CO2 24  GLUCOSE 238*  BUN 99*  CREATININE 8.97*  CALCIUM 8.5   Liver Function Tests:  Recent Labs Lab 10/20/12 1915  AST 55*  ALT 48  ALKPHOS 147*  BILITOT 0.5  PROT 5.8*  ALBUMIN 2.0*   No results found for this basename: LIPASE, AMYLASE,  in the last 168 hours  Recent Labs Lab 10/20/12 1910  AMMONIA 146*   CBC:  Recent Labs Lab 10/20/12 1915  WBC 9.9  NEUTROABS 7.0  HGB 6.8*  HCT 19.8*  MCV 87.6  PLT 171   Cardiac Enzymes: No results found for this basename: CKTOTAL, CKMB, CKMBINDEX, TROPONINI,  in the last 168 hours BNP: No components found with this basename: POCBNP,  D-dimer: No components found with this basename: D-DIMER,  CBG:  Recent Labs Lab 10/20/12 1834  GLUCAP 244*    Radiological Exams on Admission: Dg Chest Portable 1 View  10/20/2012  *RADIOLOGY REPORT*  Clinical Data: Generalized weakness.  Shortness of breath. Hyperglycemia.  PORTABLE CHEST - 1 VIEW 10/20/2012 1852 hours:  Comparison: One-view chest x-ray 09/18/2012.  Two-view chest x-ray 08/16/2012.  Portable chest x-ray 10/11/2011.  Findings: Cardiac silhouette enlarged but stable.  Thoracic aorta atherosclerotic, unchanged.  Hilar and mediastinal contours otherwise unremarkable.  Emphysematous changes in the upper lobes, left greater than right, unchanged.  Pleuroparenchymal scarring at the right base, unchanged.  No new pulmonary parenchymal abnormalities.  IMPRESSION: Stable  cardiomegaly.  Stable COPD/emphysema and pleuroparenchymal scarring at the right base.  No acute cardiopulmonary disease.   Original Report Authenticated By: Hulan Saas, M.D.      Assessment/Plan Present on Admission:  . Hepatic encephalopathy . Alcoholic cirrhosis . End stage renal disease . ESRD on peritoneal dialysis . Anemia . Hyperammonemia . Altered mental status . Diabetes mellitus   PLAN:  We'll admit this gentleman for transfusion of 2 units of packed red cell every 4 hour lactulose enemas;  consult the nephrology service and the gastrointestinal service for assistance with management. He was seen by his nephrologist and a recent admission to [facility].  Keep him n.p.o. as long as remains confused Review history when mental status improves.  Other plans as per orders.  Code Status: FULL CODE  Family Communication:  no family available at present  Disposition Plan:  On response to initial    Librado Guandique Nocturnist [facility] Pager [phone]   10/21/2012, 12:01 AM

## 2012-10-21 NOTE — Procedures (Signed)
3 hour hemodialysis treatment completed through left forearm AVF (15g needles ante/retrograde placement).  GOAL MET:  Tolerated removal of 2 liters with no interruption in ultrafiltration.  All blood was reinfused.  Hemostasis was achieved within 10 minutes.

## 2012-10-21 NOTE — Consult Note (Signed)
Referring Provider: No ref. provider found Primary Care Physician:  Ignatius Specking., MD Primary Gastroenterologist:  DR. Karilyn Cota Reason for Consultation:  ANEMIA  HPI:  LAST SEEN BY DR. Karilyn Cota IN JAN 2014 AS AN OP. HAVING DIFFICULTY WITH MS CHANGES. UNABLE TO AFFORD XIFAXAN SAMPLES GIVEN. PMHX: TIPS PLACED ? AT [facility] > 1 YEAR AGO. PT UNABLE TO GIVE HISTORY. PT ABLE TO FOLLOW SIMPLE COMMANDS INTERMITTENTLY. CONTINUES WITH PD AS AN OUTPATIENT.   Past Medical History  Diagnosis Date  . Rectal bleeding   . Dialysis care   . GI bleed   . Hypertension   . Esophageal varices   . Alcoholic liver disease   . Hepatic encephalopathy   . Ascites   . Diabetes mellitus   . Detached retina     Past Surgical History  Procedure Laterality Date  . Esophagogastroduodenoscopy  12/31/2010    EGD [facility] THERAPY  . Esophagogastroduodenoscopy  05/31/2012E    GD [facility] ABLATION  . Small bowel givens  12/01/2010  . Colonoscopy  09/07/2010  . Esophagogastroduodenoscopy  09/05/2010    OUTLAW  . Lung surgery  6/11    [place]  . Esophagogastroduodenoscopy  03/26/2011    Procedure: ESOPHAGOGASTRODUODENOSCOPY (EGD);  Surgeon: Malissa Hippo, MD;  Location: [facility];  Service: Endoscopy;  Laterality: N/A;  8:30 am  . Hot hemostasis  03/26/2011    Procedure: HOT HEMOSTASIS (ARGON PLASMA COAGULATION/BICAP);  Surgeon: Malissa Hippo, MD;  Location: [facility];  Service: Endoscopy;  Laterality: N/A;  . Stent in liver      Prior to Admission medications   Medication Sig Start Date End Date Taking? Authorizing Provider  Bisacodyl (LAXATIVE PO) Take 1 tablet by mouth daily as needed. For relief   Yes Historical Provider, MD  esomeprazole (NEXIUM) 40 MG capsule Take 40 mg by mouth daily as needed. For indigestion   Yes Historical Provider, MD  furosemide (LASIX) 80 MG tablet Take 80 mg by mouth 2 (two) times daily.   Yes Historical Provider, MD  glipiZIDE (GLUCOTROL) 5 MG tablet Take 10 mg by mouth 2  (two) times daily. At 6 am and 5 pm   Yes Historical Provider, MD  lactulose (CHRONULAC) 10 GM/15ML solution Take 10 g by mouth 2 (two) times daily.  04/10/12  Yes Malissa Hippo, MD  lanthanum (FOSRENOL) 1000 MG chewable tablet Chew 500-1,000 mg by mouth 3 (three) times daily with meals. Patient states that he chew 1 tablet with each meal , and 1/2 tablet with snack   Yes Historical Provider, MD  metoprolol succinate (TOPROL-XL) 25 MG 24 hr tablet Take 25 mg by mouth daily.   Yes Historical Provider, MD  multivitamin (RENA-VIT) TABS tablet Take 1 tablet by mouth daily.   Yes Historical Provider, MD  ondansetron (ZOFRAN) 4 MG tablet Take 4 mg by mouth every 12 (twelve) hours as needed. FOR NAUSEA   Yes Historical Provider, MD  potassium chloride (K-DUR) 10 MEQ tablet Take 30 mEq by mouth See admin instructions. Takes 30 meq post dialysis on Monday, Wednesday, and Friday.   Yes Historical Provider, MD  rifaximin (XIFAXAN) 550 MG TABS Take 275 mg by mouth 2 (two) times daily. Patient takes 1/2 pill in the morning and 1/2 pill at night 04/10/12  Yes Malissa Hippo, MD  traMADol (ULTRAM) 50 MG tablet Take 50 mg by mouth every 6 (six) hours as needed. For pain   Yes Historical Provider, MD  lidocaine-prilocaine (EMLA) cream Apply 1 application topically as  needed. Rub on arm PRIOR TO DIALYSIS on Mondays, Wednesdays, and Fridays.    Historical Provider, MD    Current Facility-Administered Medications  Medication Dose Route Frequency Provider Last Rate Last Dose  . 0.9 %  sodium chloride infusion   Intravenous STAT Laray Anger, DO 10 mL/hr at 10/20/12 2329    . insulin aspart (novoLOG) injection 0-9 Units  0-9 Units Subcutaneous Q4H Vania Rea, MD   1 Units at 10/21/12 [phone]  . lactulose (CHRONULAC) enema 300 mL  300 mL Rectal Q4H Vania Rea, MD   300 mL at 10/21/12 0511  . ondansetron (ZOFRAN) injection 4 mg  4 mg Intravenous Q4H PRN Vania Rea, MD      . pantoprazole (PROTONIX)  80 mg in sodium chloride 0.9 % 100 mL IVPB  80 mg Intravenous Q12H Vania Rea, MD   80 mg at 10/21/12 0059  . sodium chloride 0.9 % injection 3 mL  3 mL Intravenous Q12H Vania Rea, MD   3 mL at 10/21/12 0042    Allergies as of 10/20/2012 - Review Complete 10/20/2012  Allergen Reaction Noted  . Tylenol (acetaminophen) Other (See Comments) 02/09/2011  . Morphine and related Itching 02/09/2011    Family History:  UNABLE TO OBTAIN DUE TO MS CHANGES   History   Social History  . Marital Status: Divorced    Spouse Name: N/A    Number of Children: N/A  . Years of Education: N/A   Occupational History  . Not on file.   Social History Main Topics  . Smoking status: Former Smoker -- 0.50 packs/day    Types: Cigarettes    Quit date: 08/11/2011  . Smokeless tobacco: Former Neurosurgeon    Quit date: 05/12/2012  . Alcohol Use: No     Comment: denies  . Drug Use: No  . Sexually Active: Not on file   Other Topics Concern  . Not on file   Social History Narrative  . No narrative on file    Review of Systems: PER HPI OTHERWISE ALL SYSTEMS ARE NEGATIVE. LIMITED EVALUATION DUE TO MS CHANGES.   Vitals: Blood pressure 179/80, pulse 79, temperature 97.3 F (36.3 C), temperature source Oral, resp. rate 16, height 6\' 3"  (1.905 m), weight 226 lb 13.7 oz (102.9 kg), SpO2 100.00%.  Physical Exam: General:   AROUSABLE, DROWSY in NAD Head:  Normocephalic and atraumatic. Eyes:  Sclera  no icterus.    Mouth:  MUCOSA MOIST, dentition ABnormal. Neck:  Supple;  Lungs:  Clear throughout to auscultation.   No wheezes. No acute distress. Heart:  Regular rate and rhythm; no murmurs Abdomen:  Soft, nontender and distended. PD CATHETER I RLQ, Normal bowel sounds, without guarding, and without rebound.   Msk:  Symmetrical.  Extremities:  Without edema. BIL SCDs IN PLACE Neurologic:  NO FOCAL DEFICITS Psych:  FLAT affect.  Lab Results:  Recent Labs  10/20/12 1915  WBC 9.9  HGB 6.8*   HCT 19.8*  PLT 171   BMET  Recent Labs  10/20/12 1915  NA 133*  K 3.7  CL 92*  CO2 24  GLUCOSE 238*  BUN 99*  CREATININE 8.97*  CALCIUM 8.5   LFT  Recent Labs  10/20/12 1915  PROT 5.8*  ALBUMIN 2.0*  AST 55*  ALT 48  ALKPHOS 147*  BILITOT 0.5     Studies/Results: CXR: NACPD  Impression: MS CHANGES DUE TO UNCLEAR ETIOLOGY IN PT WITH KNOWN ETOH CIRRHOSIS, CHLD PUGH C, MELD 22 AND NOT A  CANDIDATE FOR LIVER TRANSPLANT. DIFFERENTIAL DIAGNOSIS INCLUDES SBP, UREMIA, AND LESS LIKELY OCCULT GIB.   Plan: 1. TRANSFUSE AS NEEDED. MONITOR FOR BLACK TARRY STOOLS, TRANSFUSION DEPENDENT ANEMIA, OR BRBP. 2. ADD CEFOTAXIME RENAL DOSE. SEND PD FLUID FOR CELL COUNT/DIFF AND CX 3. CONTINUE LACTUILOSE ENEMAS 4. MAY NEED TIPS EVALUATION.   LOS: 1 day   [facility]  10/21/2012, 9:59 AM

## 2012-10-21 NOTE — Progress Notes (Signed)
Nutrition Brief Note  Patient identified on the Malnutrition Screening Tool (MST) Report for score of 5.  Wt Readings from Last 10 Encounters:  10/21/12 226 lb 13.7 oz (102.9 kg)  09/18/12 226 lb (102.513 kg)  08/08/12 229 lb 1.6 oz (103.919 kg)  05/26/12 208 lb (94.348 kg)  04/10/12 229 lb 6.4 oz (104.055 kg)  03/26/11 224 lb (101.606 kg)  03/26/11 224 lb (101.606 kg)  03/25/11 229 lb 4.5 oz (104 kg)  03/04/11 224 lb 1.6 oz (101.651 kg)  02/22/11 260 lb (117.935 kg)   Chart reviewed. Pt on PD due to ESRD. Weight hx reveals weight has been stable over the past 6 months (1.3% loss x 3 months, 0% loss x 1 month, and 1.3% loss x 6 months). Noted distant hx of wt loss in 2012, which may be related to fluid loss. Wt changes within the past 6 months are not clinically significant.   Body mass index is 28.35 kg/(m^2). Patient meets criteria for overweight based on current BMI.   Current diet order is NPO, patient is consuming approximately n/a% of meals at this time. Labs and medications reviewed.   No nutrition interventions warranted at this time. If nutrition issues arise, please consult RD.   Melody Haver, RD, LDN Pager: [phone]

## 2012-10-22 LAB — URINALYSIS, ROUTINE W REFLEX MICROSCOPIC
Bilirubin Urine: NEGATIVE
Nitrite: NEGATIVE
Specific Gravity, Urine: <1.005 — ABNORMAL LOW (ref 1.005–1.030)
Urobilinogen, UA: 0.2 mg/dL (ref 0.0–1.0)

## 2012-10-22 LAB — CBC
HCT: 24.2 % — ABNORMAL LOW (ref 39.0–52.0)
Hemoglobin: 8.5 g/dL — ABNORMAL LOW (ref 13.0–17.0)
MCH: 31.1 pg (ref 26.0–34.0)
MCV: 88.6 fL (ref 78.0–100.0)
RBC: 2.73 MIL/uL — ABNORMAL LOW (ref 4.22–5.81)
WBC: 9.9 10*3/uL (ref 4.0–10.5)

## 2012-10-22 LAB — BASIC METABOLIC PANEL
CO2: 26 mEq/L (ref 19–32)
Chloride: 99 mEq/L (ref 96–112)
Glucose, Bld: 112 mg/dL — ABNORMAL HIGH (ref 70–99)
Sodium: 137 mEq/L (ref 135–145)

## 2012-10-22 LAB — TYPE AND SCREEN
ABO/RH(D): B NEG
Antibody Screen: NEGATIVE
Unit division: 0

## 2012-10-22 LAB — HEPATITIS B SURFACE ANTIGEN: Hepatitis B Surface Ag: NEGATIVE

## 2012-10-22 LAB — URINE MICROSCOPIC-ADD ON

## 2012-10-22 LAB — GLUCOSE, CAPILLARY
Glucose-Capillary: 102 mg/dL — ABNORMAL HIGH (ref 70–99)
Glucose-Capillary: 108 mg/dL — ABNORMAL HIGH (ref 70–99)
Glucose-Capillary: 134 mg/dL — ABNORMAL HIGH (ref 70–99)
Glucose-Capillary: 293 mg/dL — ABNORMAL HIGH (ref 70–99)
Glucose-Capillary: 294 mg/dL — ABNORMAL HIGH (ref 70–99)

## 2012-10-22 MED ORDER — LACTULOSE 10 GM/15ML PO SOLN
30.0000 g | Freq: Two times a day (BID) | ORAL | Status: DC
  Administered 2012-10-22 – 2012-10-23 (×3): 30 g via ORAL
  Filled 2012-10-22 (×3): qty 60

## 2012-10-22 MED ORDER — LACTULOSE 10 GM/15ML PO SOLN: 30.0000 g | Freq: Four times a day (QID) | ORAL | Status: DC

## 2012-10-22 MED ORDER — PANTOPRAZOLE SODIUM 40 MG PO TBEC
40.0000 mg | DELAYED_RELEASE_TABLET | Freq: Every day | ORAL | Status: DC
  Administered 2012-10-22 – 2012-10-24 (×3): 40 mg via ORAL
  Filled 2012-10-22 (×3): qty 1

## 2012-10-22 MED ORDER — SIMETHICONE 80 MG PO CHEW
160.0000 mg | CHEWABLE_TABLET | Freq: Four times a day (QID) | ORAL | Status: DC | PRN
  Administered 2012-10-22: 160 mg via ORAL
  Filled 2012-10-22 (×2): qty 2

## 2012-10-22 MED ORDER — BIOTENE DRY MOUTH MT LIQD
15.0000 mL | Freq: Two times a day (BID) | OROMUCOSAL | Status: DC
  Administered 2012-10-22 (×2): 15 mL via OROMUCOSAL

## 2012-10-22 MED ORDER — LORAZEPAM 1 MG PO TABS: 1.0000 mg | ORAL_TABLET | ORAL | Status: DC | PRN

## 2012-10-22 MED ORDER — CHLORHEXIDINE GLUCONATE 0.12 % MT SOLN
15.0000 mL | Freq: Two times a day (BID) | OROMUCOSAL | Status: DC
  Administered 2012-10-22 – 2012-10-24 (×5): 15 mL via OROMUCOSAL
  Filled 2012-10-22 (×5): qty 15

## 2012-10-22 NOTE — Progress Notes (Signed)
Subjective: Interval History: has no complaint of difficulty in breathing. Patient states that abdominal pain has improved. He doesn't remember what happened. Patient denies any nausea or vomiting..  Objective: Vital signs in last 24 hours: Temp:  [97 F (36.1 C)-98 F (36.7 C)] 97.7 F (36.5 C) (04/13 0517) Pulse Rate:  [78-128] 80 (04/13 0517) Resp:  [12-20] 20 (04/13 0517) BP: (109-187)/(66-92) 148/84 mmHg (04/13 0517) SpO2:  [99 %-100 %] 99 % (04/13 0517) Weight:  [101.8 kg (224 lb 6.9 oz)] 101.8 kg (224 lb 6.9 oz) (04/13 0517) Weight change: -0.713 kg (-1 lb 9.2 oz)  Intake/Output from previous day: 04/12 0701 - 04/13 0700 In: 84.5 [I.V.:34.5; IV Piggyback:50] Out: 2050  Intake/Output this shift:    General appearance: alert, cooperative and no distress Resp: clear to auscultation bilaterally Cardio: regular rate and rhythm, S1, S2 normal, no murmur, click, rub or gallop GI: soft, non-tender; bowel sounds normal; no masses,  no organomegaly Extremities: extremities normal, atraumatic, no cyanosis or edema  Lab Results:  Recent Labs  10/21/12 1100 10/22/12 0718  WBC 9.5 9.9  HGB 9.0* 8.5*  HCT 24.8* 24.2*  PLT 160 133*   BMET:  Recent Labs  10/21/12 1100 10/22/12 0718  NA 134* 137  K 3.4* 3.7  CL 93* 99  CO2 25 26  GLUCOSE 112* 112*  BUN 108* 63*  CREATININE 9.38* 6.69*  CALCIUM 8.6 8.2*   No results found for this basename: PTH,  in the last 72 hours Iron Studies: No results found for this basename: IRON, TIBC, TRANSFERRIN, FERRITIN,  in the last 72 hours  Studies/Results: Dg Chest Portable 1 View  10/20/2012  *RADIOLOGY REPORT*  Clinical Data: Generalized weakness.  Shortness of breath. Hyperglycemia.  PORTABLE CHEST - 1 VIEW 10/20/2012 1852 hours:  Comparison: One-view chest x-ray 09/18/2012.  Two-view chest x-ray 08/16/2012.  Portable chest x-ray 10/11/2011.  Findings: Cardiac silhouette enlarged but stable.  Thoracic aorta atherosclerotic,  unchanged.  Hilar and mediastinal contours otherwise unremarkable.  Emphysematous changes in the upper lobes, left greater than right, unchanged.  Pleuroparenchymal scarring at the right base, unchanged.  No new pulmonary parenchymal abnormalities.  IMPRESSION: Stable cardiomegaly.  Stable COPD/emphysema and pleuroparenchymal scarring at the right base.  No acute cardiopulmonary disease.   Original Report Authenticated By: Hulan Saas, M.D.     I have reviewed the patient's current medications.  Assessment/Plan: Problem #1 end-stage renal disease he status post hemodialysis yesterday BUN is 63 and creatinine is 6.69 has improved.  Problem #2 hypokalemia potassium is 3.7 corrected. Problem #3 altered mental status this is secondary to hepatic encephalopathy. Since his BUN was also high probably that may contribute to this problem. Problem #4 abdominal pain. This is a recurrent issue. Patient has been complaining of pain even before he was started on peritoneal dialysis. And once paternal catheter was put patient continued to complain pain. He has been up more head hospital about a week ago and during that time PD fluid to bacterial count and culture was negative. Patient was put on Lasix empirically. Presently he said his abdominal pain is getting better. Problem #5 metabolic bone disease presently his phosphorus is high. Problem #6 history of liver cirrhosis Problem #7 diabetes. Plan: We'll make arrangements for patient to get dialysis tomorrow. I discussed with him at this moment patient probably many to do hemodialysis. Patient very non-compliant with his medications and even his dialysis before. Presently her blood pressure was as she is doing paternal dialysis at home consistently.  Since patient also going back and forth to the hospital he has multiple interruptions. When he came his BUN was high and possibly he may not be getting adequate dialysis. At this moment cannot rule out upper GI  bleeding.   LOS: 2 days   Fedrick Cefalu S 10/22/2012,9:11 AM

## 2012-10-22 NOTE — Progress Notes (Signed)
[facility] PROGRESS NOTE  DICKEY CAAMANO WUJ:[id number] DOB: [birth year] DOA: 10/20/2012 PCP: Ignatius Specking., MD  Assessment/Plan: 1. Hepatic encephalopathy. Continue lactulose enemas. Mental status is significantly improved today. 2. Abdominal pain. Concern for underlying SBP. Started on cefotaxime per GI. Plan will be to continue total of 5 days of antibiotics.  3. Anemia. Unclear if this is related to GI bleeding. He is status post 2 units of PRBCs with improvement in hemoglobin. GI is following. Fecal occult blood is positive. Continue proton pump inhibitors. Patient did have dark tarry stool today. Will keep him n.p.o. after midnight in case there will be plans for endoscopy tomorrow. 4. End-stage renal disease on peritoneal dialysis. Patient is undergoing hemodialysis while here in the hospital. Not entirely clear his uremia is from GI bleeding versus worsening renal failure. 5. Diabetes, blood sugars are stable. 6. Hypertension. Stable  Code Status: Full code Family Communication: No family at bedside Disposition Plan: To be determined   Consultants:  Nephrology,  Gastroenterology  Procedures:  Hemodialysis  Antibiotics:  Cefotaxime 4/12  HPI/Subjective: Sitting up in bed, awake and alert, no complaints, abdominal pain is better.  Objective: Filed Vitals:   10/22/12 0213 10/22/12 0517 10/22/12 1018 10/22/12 1410  BP: 150/71 148/84 157/71 135/65  Pulse: 82 80 78 87  Temp: 98 F (36.7 C) 97.7 F (36.5 C) 97.7 F (36.5 C) 97.5 F (36.4 C)  TempSrc: Oral Oral Oral Oral  Resp: 20 20 20 20   Height:      Weight:  101.8 kg (224 lb 6.9 oz)    SpO2: 100% 99% 98% 100%    Intake/Output Summary (Last 24 hours) at 10/22/12 1628 Last data filed at 10/22/12 0900  Gross per 24 hour  Intake      0 ml  Output   2050 ml  Net  -2050 ml   Filed Weights   10/20/12 2308 10/21/12 0446 10/22/12 0517  Weight: 103 kg (227 lb 1.2 oz) 102.9 kg (226 lb 13.7 oz) 101.8 kg (224 lb  6.9 oz)    Exam:   General:  Awake, alert, no acute distress  Cardiovascular: S1, S2, regular rate and rhythm  Respiratory: Clear to auscultation bilaterally  Abdomen: Soft, distended, diffusely tender  Musculoskeletal: No lower extremity edema   Data Reviewed: Basic Metabolic Panel:  Recent Labs Lab 10/20/12 1915 10/21/12 1100 10/22/12 0718  NA 133* 134* 137  K 3.7 3.4* 3.7  CL 92* 93* 99  CO2 24 25 26   GLUCOSE 238* 112* 112*  BUN 99* 108* 63*  CREATININE 8.97* 9.38* 6.69*  CALCIUM 8.5 8.6 8.2*  MG  --  2.4  --   PHOS  --   --  6.4*   Liver Function Tests:  Recent Labs Lab 10/20/12 1915 10/21/12 1100  AST 55* 51*  ALT 48 50  ALKPHOS 147* 117  BILITOT 0.5 0.9  PROT 5.8* 5.6*  ALBUMIN 2.0* 2.0*   No results found for this basename: LIPASE, AMYLASE,  in the last 168 hours  Recent Labs Lab 10/20/12 1910 10/21/12 1100  AMMONIA 146* 79*   CBC:  Recent Labs Lab 10/20/12 1915 10/21/12 1100 10/22/12 0718  WBC 9.9 9.5 9.9  NEUTROABS 7.0  --   --   HGB 6.8* 9.0* 8.5*  HCT 19.8* 24.8* 24.2*  MCV 87.6 87.6 88.6  PLT 171 160 133*   Cardiac Enzymes: No results found for this basename: CKTOTAL, CKMB, CKMBINDEX, TROPONINI,  in the last 168 hours BNP (last 3 results)  No results found for this basename: PROBNP,  in the last 8760 hours CBG:  Recent Labs Lab 10/22/12 0029 10/22/12 0211 10/22/12 0555 10/22/12 0742 10/22/12 1154  GLUCAP 134* 108* 103* 102* 99    No results found for this or any previous visit (from the past 240 hour(s)).   Studies: Dg Chest Portable 1 View  10/20/2012  *RADIOLOGY REPORT*  Clinical Data: Generalized weakness.  Shortness of breath. Hyperglycemia.  PORTABLE CHEST - 1 VIEW 10/20/2012 1852 hours:  Comparison: One-view chest x-ray 09/18/2012.  Two-view chest x-ray 08/16/2012.  Portable chest x-ray 10/11/2011.  Findings: Cardiac silhouette enlarged but stable.  Thoracic aorta atherosclerotic, unchanged.  Hilar and  mediastinal contours otherwise unremarkable.  Emphysematous changes in the upper lobes, left greater than right, unchanged.  Pleuroparenchymal scarring at the right base, unchanged.  No new pulmonary parenchymal abnormalities.  IMPRESSION: Stable cardiomegaly.  Stable COPD/emphysema and pleuroparenchymal scarring at the right base.  No acute cardiopulmonary disease.   Original Report Authenticated By: Hulan Saas, M.D.     Scheduled Meds: . antiseptic oral rinse  15 mL Mouth Rinse q12n4p  . cefoTAXime (CLAFORAN) IV  2 g Intravenous Q1200  . chlorhexidine  15 mL Mouth Rinse BID  . insulin aspart  0-9 Units Subcutaneous Q4H  . lactulose  30 g Oral BID  . metoprolol tartrate  25 mg Oral BID  . pantoprazole  40 mg Oral QAC breakfast  . sodium chloride  3 mL Intravenous Q12H   Continuous Infusions:   Principal Problem:   Hepatic encephalopathy Active Problems:   Alcoholic cirrhosis   Diabetes mellitus   End stage renal disease   ESRD on peritoneal dialysis   Anemia   Hyperammonemia   Altered mental status    Time spent:    [facility]  [facility] Pager [phone]. If 7PM-7AM, please contact night-coverage at [email or web address], password [facility] 10/22/2012, 4:28 PM  LOS: 2 days

## 2012-10-22 NOTE — Progress Notes (Addendum)
Subjective: Since I last evaluated the patient his mental status is improved. REQUESTING FOOD. DOESN'T LIKE LACTULOSE. UNAWARE HE WAS GIVEN XIFAXAN AS OP. DIALYSIS NURSE UNABLE TO OBTAIN PERITONEAL FLUID.  Objective: Vital signs in last 24 hours: Temp:  [97 F (36.1 C)-98 F (36.7 C)] 97.7 F (36.5 C) (04/13 0517) Pulse Rate:  [78-128] 80 (04/13 0517) Resp:  [12-20] 20 (04/13 0517) BP: (109-187)/(66-92) 148/84 mmHg (04/13 0517) SpO2:  [99 %-100 %] 99 % (04/13 0517) Weight:  [224 lb 6.9 oz (101.8 kg)] 224 lb 6.9 oz (101.8 kg) (04/13 0517) Last BM Date: 10/22/12  Intake/Output from previous day: 04/12 0701 - 04/13 0700 In: 84.5 [I.V.:34.5; IV Piggyback:50] Out: 2050  Intake/Output this shift:   General appearance: alert, cooperative and no distress Resp: clear to auscultation bilaterally Cardio: regular rate and rhythm GI: soft, MILD DISTENTION, non-tender; bowel sounds normal;   Lab Results:  Recent Labs  10/20/12 1915 10/21/12 1100 10/22/12 0718  WBC 9.9 9.5 9.9  HGB 6.8* 9.0* 8.5*  HCT 19.8* 24.8* 24.2*  PLT 171 160 133*   BMET  Recent Labs  10/20/12 1915 10/21/12 1100 10/22/12 0718  NA 133* 134* 137  K 3.7 3.4* 3.7  CL 92* 93* 99  CO2 24 25 26   GLUCOSE 238* 112* 112*  BUN 99* 108* 63*  CREATININE 8.97* 9.38* 6.69*  CALCIUM 8.5 8.6 8.2*   LFT  Recent Labs  10/21/12 1100  PROT 5.6*  ALBUMIN 2.0*  AST 51*  ALT 50  ALKPHOS 117  BILITOT 0.9   PT/INR  Recent Labs  10/20/12 1945  LABPROT 15.2  INR 1.22   Hepatitis Panel No results found for this basename: HEPBSAG, HCVAB, HEPAIGM, HEPBIGM,  in the last 72 hours C-Diff No results found for this basename: CDIFFTOX,  in the last 72 hours Fecal Lactopherrin No results found for this basename: FECLLACTOFRN,  in the last 72 hours  Studies/Results: Dg Chest Portable 1 View  10/20/2012  *RADIOLOGY REPORT*  Clinical Data: Generalized weakness.  Shortness of breath. Hyperglycemia.  PORTABLE CHEST -  1 VIEW 10/20/2012 1852 hours:  Comparison: One-view chest x-ray 09/18/2012.  Two-view chest x-ray 08/16/2012.  Portable chest x-ray 10/11/2011.  Findings: Cardiac silhouette enlarged but stable.  Thoracic aorta atherosclerotic, unchanged.  Hilar and mediastinal contours otherwise unremarkable.  Emphysematous changes in the upper lobes, left greater than right, unchanged.  Pleuroparenchymal scarring at the right base, unchanged.  No new pulmonary parenchymal abnormalities.  IMPRESSION: Stable cardiomegaly.  Stable COPD/emphysema and pleuroparenchymal scarring at the right base.  No acute cardiopulmonary disease.   Original Report Authenticated By: Hulan Saas, M.D.     Medications: I have reviewed the patient's current medications.  Assessment/Plan: MS CHANGES DUE TO HEPATIC ENCEPHALOPATHY/UREMIA. MS IMPROVED AFTER HEMODIALYSIS, ABX,  AND LACTULOSE. NO SOURCE FOR ELEVATED AMMONIA IDENTIFIED. NO S/SX OF ACTIVE GIB.  PLAN: 1. COMPLETE 5 DAYS OF CEFOTAXTIME OR PO CIPRO  2. ADVANCE DIET 3. CHANGE LACTULOSE TO PO. CONTINUE XIFAXAN. 4. MONITOR HB. NO INDICATION FOR EGD AT THIS TIME.   LOS: 2 days   [facility] 10/22/2012, 8:08 AM

## 2012-10-23 DIAGNOSIS — K7030 Alcoholic cirrhosis of liver without ascites: Secondary | ICD-10-CM

## 2012-10-23 DIAGNOSIS — K7290 Hepatic failure, unspecified without coma: Secondary | ICD-10-CM

## 2012-10-23 DIAGNOSIS — E119 Type 2 diabetes mellitus without complications: Secondary | ICD-10-CM

## 2012-10-23 DIAGNOSIS — K921 Melena: Secondary | ICD-10-CM

## 2012-10-23 LAB — GLUCOSE, CAPILLARY
Glucose-Capillary: 127 mg/dL — ABNORMAL HIGH (ref 70–99)
Glucose-Capillary: 204 mg/dL — ABNORMAL HIGH (ref 70–99)
Glucose-Capillary: 327 mg/dL — ABNORMAL HIGH (ref 70–99)

## 2012-10-23 LAB — BASIC METABOLIC PANEL
BUN: 82 mg/dL — ABNORMAL HIGH (ref 6–23)
CO2: 23 mEq/L (ref 19–32)
Calcium: 8.4 mg/dL (ref 8.4–10.5)
Creatinine, Ser: 7.77 mg/dL — ABNORMAL HIGH (ref 0.50–1.35)
GFR calc non Af Amer: 7 mL/min — ABNORMAL LOW (ref >90)
Glucose, Bld: 218 mg/dL — ABNORMAL HIGH (ref 70–99)
Sodium: 139 mEq/L (ref 135–145)

## 2012-10-23 LAB — CBC
MCH: 31.2 pg (ref 26.0–34.0)
MCHC: 34.8 g/dL (ref 30.0–36.0)
MCV: 89.6 fL (ref 78.0–100.0)
Platelets: 152 10*3/uL (ref 150–400)
RBC: 2.79 MIL/uL — ABNORMAL LOW (ref 4.22–5.81)

## 2012-10-23 LAB — PHOSPHORUS: Phosphorus: 6.7 mg/dL — ABNORMAL HIGH (ref 2.3–4.6)

## 2012-10-23 LAB — OCCULT BLOOD, POC DEVICE: Fecal Occult Bld: POSITIVE — AB

## 2012-10-23 MED ORDER — OXYCODONE HCL 5 MG PO TABS
5.0000 mg | ORAL_TABLET | Freq: Four times a day (QID) | ORAL | Status: DC | PRN
  Administered 2012-10-23 (×2): 5 mg via ORAL
  Filled 2012-10-23 (×4): qty 1

## 2012-10-23 MED ORDER — LIDOCAINE-PRILOCAINE 2.5-2.5 % EX CREA
1.0000 "application " | TOPICAL_CREAM | CUTANEOUS | Status: DC | PRN
  Filled 2012-10-23: qty 5

## 2012-10-23 MED ORDER — HEPARIN SODIUM (PORCINE) 1000 UNIT/ML DIALYSIS
300.0000 [IU] | INTRAMUSCULAR | Status: DC | PRN
  Filled 2012-10-23: qty 1

## 2012-10-23 MED ORDER — SEVELAMER CARBONATE 800 MG PO TABS
1600.0000 mg | ORAL_TABLET | Freq: Three times a day (TID) | ORAL | Status: DC
  Administered 2012-10-23 – 2012-10-24 (×3): 1600 mg via ORAL
  Filled 2012-10-23 (×4): qty 2

## 2012-10-23 MED ORDER — ALPRAZOLAM 0.25 MG PO TABS
0.2500 mg | ORAL_TABLET | Freq: Every evening | ORAL | Status: DC | PRN
  Administered 2012-10-23 (×2): 0.25 mg via ORAL
  Filled 2012-10-23 (×3): qty 1

## 2012-10-23 MED ORDER — PENTAFLUOROPROP-TETRAFLUOROETH EX AERO
1.0000 "application " | INHALATION_SPRAY | CUTANEOUS | Status: DC | PRN
  Filled 2012-10-23: qty 103.5

## 2012-10-23 MED ORDER — SODIUM CHLORIDE 0.9 % IV SOLN: 100.0000 mL | INTRAVENOUS | Status: DC | PRN

## 2012-10-23 MED ORDER — HEPARIN SODIUM (PORCINE) 1000 UNIT/ML DIALYSIS
1000.0000 [IU] | INTRAMUSCULAR | Status: DC | PRN
  Filled 2012-10-23: qty 1

## 2012-10-23 MED ORDER — ALTEPLASE 2 MG IJ SOLR
2.0000 mg | Freq: Once | INTRAMUSCULAR | Status: DC | PRN
  Filled 2012-10-23: qty 2

## 2012-10-23 MED ORDER — SODIUM CHLORIDE 0.9 % IV SOLN
INTRAVENOUS | Status: DC
  Administered 2012-10-23: 19:00:00 via INTRAVENOUS

## 2012-10-23 MED ORDER — NEPRO/CARBSTEADY PO LIQD
237.0000 mL | ORAL | Status: DC | PRN
  Filled 2012-10-23: qty 237

## 2012-10-23 MED ORDER — LIDOCAINE HCL (PF) 1 % IJ SOLN
5.0000 mL | INTRAMUSCULAR | Status: DC | PRN
  Filled 2012-10-23: qty 5

## 2012-10-23 MED ORDER — HEPARIN SODIUM (PORCINE) 1000 UNIT/ML DIALYSIS
20.0000 [IU]/kg | INTRAMUSCULAR | Status: DC | PRN
  Filled 2012-10-23: qty 2

## 2012-10-23 NOTE — Progress Notes (Signed)
Inpatient Diabetes Program Recommendations  AACE/ADA: New Consensus Statement on Inpatient Glycemic Control (2013)  Target Ranges:  Prepandial:   less than 140 mg/dL      Peak postprandial:   less than 180 mg/dL (1-2 hours)      Critically ill patients:  140 - 180 mg/dL   Results for JAMIER, URBAS (MRN [id number]) as of 10/23/2012 13:18  Ref. Range 10/22/2012 16:43 10/22/2012 20:00 10/23/2012 00:01 10/23/2012 03:29 10/23/2012 08:05  Glucose-Capillary Latest Range: 70-99 mg/dL 213 (H) 086 (H) 578 (H) 204 (H) 207 (H)    Inpatient Diabetes Program Recommendations Insulin - Basal: Please consider ordering low dose basal insulin while inpatient.  Recommend Levemir 10 units daily. Correction (SSI): May want to change frequency to ACHS if patient is eating without any issues.  Note: Blood glucose over the past 20 hours has ranged from 204-327 mg/dl.  Please consider ordering low dose basal insulin while inpatient; recommend Levemir 10 units daily.  Also, if patient is eating well without any issues, may want to change the frequency of Novolog correction and CBGs to ACHS.  Will continue to follow.  Thanks, Orlando Penner, RN, BSN, CCRN Diabetes Coordinator Inpatient Diabetes Program [phone]

## 2012-10-23 NOTE — Progress Notes (Addendum)
Subjective: Interval History: has complaints diarrhea. He denies any nausea vomiting. Presently is feeling better. Abdominal pain is getting better..  Objective: Vital signs in last 24 hours: Temp:  [97.3 F (36.3 C)-98.1 F (36.7 C)] 97.3 F (36.3 C) (04/14 0003) Pulse Rate:  [78-87] 80 (04/14 0003) Resp:  [20] 20 (04/14 0003) BP: (134-157)/(62-71) 149/62 mmHg (04/14 0003) SpO2:  [97 %-100 %] 97 % (04/14 0003) Weight:  [103.9 kg (229 lb 0.9 oz)] 103.9 kg (229 lb 0.9 oz) (04/14 0440) Weight change: 2.1 kg (4 lb 10.1 oz)  Intake/Output from previous day: 04/13 0701 - 04/14 0700 In: 240 [P.O.:240] Out: -  Intake/Output this shift:    General appearance: alert, cooperative and no distress Resp: clear to auscultation bilaterally Cardio: regular rate and rhythm, S1, S2 normal, no murmur, click, rub or gallop GI: Somewhat distended, nontender and hyperactive Extremities: extremities normal, atraumatic, no cyanosis or edema  Lab Results:  Recent Labs  10/22/12 0718 10/23/12 0404  WBC 9.9 10.7*  HGB 8.5* 8.7*  HCT 24.2* 25.0*  PLT 133* 152   BMET:  Recent Labs  10/22/12 0718 10/23/12 0404  NA 137 139  K 3.7 3.6  CL 99 100  CO2 26 23  GLUCOSE 112* 218*  BUN 63* 82*  CREATININE 6.69* 7.77*  CALCIUM 8.2* 8.4   No results found for this basename: PTH,  in the last 72 hours Iron Studies: No results found for this basename: IRON, TIBC, TRANSFERRIN, FERRITIN,  in the last 72 hours  Studies/Results: No results found.  I have reviewed the patient's current medications.  Assessment/Plan: Problem #1 end-stage renal disease he status post hemodialysis on Saturday NEC 2 to creatinine 7.7. His potassium is 3.6. Problem #2 anemia his hemoglobin 8.7 hematocrit 25 on Epogen stable. Problem #3 hepatic encephalopathy improving Problem #4 diabetes Problem #5 abdominal pain presently he claims getting better. Patient on antibiotics. Problem #6 metabolic bone disease his calcium  is stable but phosphorus is increasing. Presently his binder was stopped because of his abdominal pain. Problem #7 degenerative joint disease with recurrent back pain. Plan: We'll make arrangements for patient to get dialysis today Presently he patient will like to continue with peritoneal dialysis as outpatient. We'll start him on Renvella 800 mg 2 tablet 2 3 times a day with meals and one with snack.   LOS: 3 days   [facility] S 10/23/2012,8:39 AM   Patient is seen on hemodialysis. Patient blood pressure started declining when we attempt to ultrafiltrate. Presently his blood pressure is 109/71. Patient is a symptomatic. Were only able to get about 1 L. Plan: Presently will DC ultrafiltration and continue his dialysis.

## 2012-10-23 NOTE — Procedures (Signed)
Hemodialysis treatment was delayed due to pt's need for EMLA cream and cling wrap.  Refused cannulation of AVF with 15g needles.  "Use the smallest needle you have."  Left forearm AVF was cannulated with 17g needles (antegrade) without difficulty.  4 hour hemodialysis treatment completed at Qb 250cc/min.  BP unable to tolerate ultrafiltration; only 1 L was removed.  Had two bowel movements during hemodialysis (bedside commode) and one emetic episode relieved with Zofran and NS bolus.  All blood was reinfused post HD and hemostasis was achieved within 10 minutes.  Dr. Kristian Covey was notified of above.  Report given to Arloa Koh, RN

## 2012-10-23 NOTE — Progress Notes (Signed)
Patient ID: Timothy Chan, male   DOB: [birth year], 62 y.o.   MRN: [id number] Alert this am. No complaints.  He says he passed black tarry stool during the night. Ammonia is coming down.  Filed Vitals:   10/22/12 1410 10/22/12 2007 10/23/12 0003 10/23/12 0440  BP: 135/65 134/69 149/62   Pulse: 87 83 80   Temp: 97.5 F (36.4 C) 98.1 F (36.7 C) 97.3 F (36.3 C)   TempSrc: Oral Oral Oral   Resp: 20 20 20    Height:      Weight:    229 lb 0.9 oz (103.9 kg)  SpO2: 100% 100% 97%    CBC    Component Value Date/Time   WBC 10.7* 10/23/2012 0404   RBC 2.79* 10/23/2012 0404   HGB 8.7* 10/23/2012 0404   HCT 25.0* 10/23/2012 0404   PLT 152 10/23/2012 0404   MCV 89.6 10/23/2012 0404   MCH 31.2 10/23/2012 0404   MCHC 34.8 10/23/2012 0404   RDW 15.8* 10/23/2012 0404   LYMPHSABS 2.0 10/20/2012 1915   MONOABS 0.9 10/20/2012 1915   EOSABS 0.0 10/20/2012 1915   BASOSABS 0.0 10/20/2012 1915   Will discuss with Dr. Karilyn Cota

## 2012-10-23 NOTE — Progress Notes (Signed)
[facility] PROGRESS NOTE  LUIAN SCHUMPERT WUJ:[id number] DOB: 04/13/1951 DOA: 10/20/2012 PCP: Ignatius Specking., MD  Assessment/Plan: 1. Hepatic encephalopathy. Mental status has improved. 2. Abdominal pain. Concern for underlying SBP. Started on cefotaxime per GI. Plan will be to continue total of 5 days of antibiotics. Today's day 3 of antibiotics. 3. Anemia. Unclear if this is related to GI bleeding. He is status post 2 units of PRBCs with improvement in hemoglobin. GI is following. Fecal occult blood is positive. Continue proton pump inhibitors. He is having dark stools. Question if this is due to ongoing bleeding versus old blood. Patient will be kept n.p.o. after midnight for endoscopy in the morning. Discussed with Dr. Karilyn Cota. 4. End-stage renal disease on peritoneal dialysis. Patient is undergoing hemodialysis while here in the hospital. Not entirely clear his uremia is from GI bleeding versus worsening renal failure. He will continue on peritoneal dialysis on discharge. 5. Diabetes, blood sugars are stable. 6. Hypertension. Stable  Code Status: Full code Family Communication: No family at bedside Disposition Plan: To be determined   Consultants:  Nephrology,  Gastroenterology  Procedures:  Hemodialysis  Antibiotics:  Cefotaxime 4/12  HPI/Subjective: He is mildly short of breath today. No vomiting. Continues to have dark bowel movements overnight.  Objective: Filed Vitals:   10/22/12 1410 10/22/12 2007 10/23/12 0003 10/23/12 0440  BP: 135/65 134/69 149/62   Pulse: 87 83 80   Temp: 97.5 F (36.4 C) 98.1 F (36.7 C) 97.3 F (36.3 C)   TempSrc: Oral Oral Oral   Resp: 20 20 20    Height:      Weight:    103.9 kg (229 lb 0.9 oz)  SpO2: 100% 100% 97%     Intake/Output Summary (Last 24 hours) at 10/23/12 1151 Last data filed at 10/22/12 1730  Gross per 24 hour  Intake    240 ml  Output      0 ml  Net    240 ml   Filed Weights   10/21/12 0446 10/22/12 0517  10/23/12 0440  Weight: 102.9 kg (226 lb 13.7 oz) 101.8 kg (224 lb 6.9 oz) 103.9 kg (229 lb 0.9 oz)    Exam:   General:  Awake, alert, no acute distress  Cardiovascular: S1, S2, regular rate and rhythm  Respiratory: Crackles at bases bilaterally  Abdomen: Soft, distended, diffusely tender  Musculoskeletal: No lower extremity edema   Data Reviewed: Basic Metabolic Panel:  Recent Labs Lab 10/20/12 1915 10/21/12 1100 10/22/12 0718 10/23/12 0404  NA 133* 134* 137 139  K 3.7 3.4* 3.7 3.6  CL 92* 93* 99 100  CO2 24 25 26 23   GLUCOSE 238* 112* 112* 218*  BUN 99* 108* 63* 82*  CREATININE 8.97* 9.38* 6.69* 7.77*  CALCIUM 8.5 8.6 8.2* 8.4  MG  --  2.4  --   --   PHOS  --   --  6.4* 6.7*   Liver Function Tests:  Recent Labs Lab 10/20/12 1915 10/21/12 1100  AST 55* 51*  ALT 48 50  ALKPHOS 147* 117  BILITOT 0.5 0.9  PROT 5.8* 5.6*  ALBUMIN 2.0* 2.0*   No results found for this basename: LIPASE, AMYLASE,  in the last 168 hours  Recent Labs Lab 10/20/12 1910 10/21/12 1100  AMMONIA 146* 79*   CBC:  Recent Labs Lab 10/20/12 1915 10/21/12 1100 10/22/12 0718 10/23/12 0404  WBC 9.9 9.5 9.9 10.7*  NEUTROABS 7.0  --   --   --   HGB 6.8* 9.0*  8.5* 8.7*  HCT 19.8* 24.8* 24.2* 25.0*  MCV 87.6 87.6 88.6 89.6  PLT 171 160 133* 152   Cardiac Enzymes: No results found for this basename: CKTOTAL, CKMB, CKMBINDEX, TROPONINI,  in the last 168 hours BNP (last 3 results) No results found for this basename: PROBNP,  in the last 8760 hours CBG:  Recent Labs Lab 10/22/12 1643 10/22/12 2000 10/23/12 0001 10/23/12 0329 10/23/12 0805  GLUCAP 293* 294* 327* 204* 207*    No results found for this or any previous visit (from the past 240 hour(s)).   Studies: No results found.  Scheduled Meds: . antiseptic oral rinse  15 mL Mouth Rinse q12n4p  . cefoTAXime (CLAFORAN) IV  2 g Intravenous Q1200  . chlorhexidine  15 mL Mouth Rinse BID  . insulin aspart  0-9 Units  Subcutaneous Q4H  . lactulose  30 g Oral BID  . metoprolol tartrate  25 mg Oral BID  . pantoprazole  40 mg Oral QAC breakfast  . sevelamer carbonate  1,600 mg Oral TID WC  . sodium chloride  3 mL Intravenous Q12H   Continuous Infusions:   Principal Problem:   Hepatic encephalopathy Active Problems:   Alcoholic cirrhosis   Diabetes mellitus   End stage renal disease   ESRD on peritoneal dialysis   Anemia   Hyperammonemia   Altered mental status    Time spent:    [facility]  [facility] Pager [phone]. If 7PM-7AM, please contact night-coverage at [email or web address], password [facility] 10/23/2012, 11:51 AM  LOS: 3 days

## 2012-10-24 ENCOUNTER — Encounter (HOSPITAL_COMMUNITY): Payer: Self-pay | Admitting: *Deleted

## 2012-10-24 ENCOUNTER — Encounter (HOSPITAL_COMMUNITY)
Admission: EM | Discharge status: Against Medical Advice | Payer: Self-pay | Source: Home / Self Care | Attending: Internal Medicine

## 2012-10-24 DIAGNOSIS — K31811 Angiodysplasia of stomach and duodenum with bleeding: Secondary | ICD-10-CM

## 2012-10-24 DIAGNOSIS — K766 Portal hypertension: Secondary | ICD-10-CM

## 2012-10-24 DIAGNOSIS — K922 Gastrointestinal hemorrhage, unspecified: Secondary | ICD-10-CM | POA: Diagnosis present

## 2012-10-24 HISTORY — PX: ESOPHAGOGASTRODUODENOSCOPY: SHX5428

## 2012-10-24 LAB — BASIC METABOLIC PANEL
Calcium: 8.1 mg/dL — ABNORMAL LOW (ref 8.4–10.5)
Creatinine, Ser: 5.4 mg/dL — ABNORMAL HIGH (ref 0.50–1.35)
GFR calc Af Amer: 12 mL/min — ABNORMAL LOW (ref >90)
GFR calc non Af Amer: 10 mL/min — ABNORMAL LOW (ref >90)

## 2012-10-24 LAB — CBC
MCV: 91.3 fL (ref 78.0–100.0)
Platelets: 98 10*3/uL — ABNORMAL LOW (ref 150–400)
RDW: 16 % — ABNORMAL HIGH (ref 11.5–15.5)
WBC: 6.3 10*3/uL (ref 4.0–10.5)

## 2012-10-24 LAB — GLUCOSE, CAPILLARY
Glucose-Capillary: 163 mg/dL — ABNORMAL HIGH (ref 70–99)
Glucose-Capillary: 265 mg/dL — ABNORMAL HIGH (ref 70–99)

## 2012-10-24 LAB — PREPARE RBC (CROSSMATCH)

## 2012-10-24 SURGERY — EGD (ESOPHAGOGASTRODUODENOSCOPY)
Anesthesia: Moderate Sedation

## 2012-10-24 MED ORDER — BUTAMBEN-TETRACAINE-BENZOCAINE 2-2-14 % EX AERO
INHALATION_SPRAY | CUTANEOUS | Status: DC | PRN
  Administered 2012-10-24: 1 via TOPICAL

## 2012-10-24 MED ORDER — MIDAZOLAM HCL 5 MG/5ML IJ SOLN
INTRAMUSCULAR | Status: AC
  Filled 2012-10-24: qty 10

## 2012-10-24 MED ORDER — SIMETHICONE 40 MG/0.6ML PO SUSP
ORAL | Status: DC | PRN
  Administered 2012-10-24: 12:00:00

## 2012-10-24 MED ORDER — MEPERIDINE HCL 25 MG/ML IJ SOLN
INTRAMUSCULAR | Status: DC | PRN
  Administered 2012-10-24 (×2): 25 mg via INTRAVENOUS

## 2012-10-24 MED ORDER — METOPROLOL TARTRATE 25 MG PO TABS
25.0000 mg | ORAL_TABLET | Freq: Two times a day (BID) | ORAL | Status: DC
  Administered 2012-10-24: 25 mg via ORAL
  Filled 2012-10-24: qty 1

## 2012-10-24 MED ORDER — MEPERIDINE HCL 50 MG/ML IJ SOLN
INTRAMUSCULAR | Status: AC
  Filled 2012-10-24: qty 1

## 2012-10-24 MED ORDER — SUCRALFATE 1 GM/10ML PO SUSP
1.0000 g | Freq: Three times a day (TID) | ORAL | Status: DC
  Administered 2012-10-24: 1 g via ORAL
  Filled 2012-10-24: qty 10

## 2012-10-24 MED ORDER — LACTULOSE 10 GM/15ML PO SOLN
30.0000 g | Freq: Two times a day (BID) | ORAL | Status: DC
  Administered 2012-10-24: 30 g via ORAL
  Filled 2012-10-24: qty 30

## 2012-10-24 MED ORDER — EPOETIN ALFA 10000 UNIT/ML IJ SOLN
10000.0000 [IU] | INTRAMUSCULAR | Status: DC
  Filled 2012-10-24: qty 1

## 2012-10-24 MED ORDER — MIDAZOLAM HCL 5 MG/5ML IJ SOLN
INTRAMUSCULAR | Status: DC | PRN
  Administered 2012-10-24 (×2): 1 mg via INTRAVENOUS
  Administered 2012-10-24: 2 mg via INTRAVENOUS
  Administered 2012-10-24: 1 mg via INTRAVENOUS
  Administered 2012-10-24: 2 mg via INTRAVENOUS
  Administered 2012-10-24: 1 mg via INTRAVENOUS

## 2012-10-24 NOTE — Progress Notes (Signed)
Inpatient Diabetes Program Recommendations  AACE/ADA: New Consensus Statement on Inpatient Glycemic Control (2013)  Target Ranges:  Prepandial:   less than 140 mg/dL      Peak postprandial:   less than 180 mg/dL (1-2 hours)      Critically ill patients:  140 - 180 mg/dL   Results for Timothy Chan, Timothy Chan (MRN [id number]) as of 10/24/2012 13:55  Ref. Range 10/23/2012 00:01 10/23/2012 03:29 10/23/2012 08:05 10/23/2012 16:20 10/23/2012 20:04 10/23/2012 23:43 10/24/2012 04:03 10/24/2012 07:34  Glucose-Capillary Latest Range: 70-99 mg/dL 308 (H) 657 (H) 846 (H) 127 (H) 310 (H) 228 (H) 169 (H) 163 (H)    Inpatient Diabetes Program Recommendations Insulin - Basal: Please consider ordering low dose basal insulin; recommend Levemir 10 units daily.   Note: Blood glucose has ranged from 127-327 mg/dl over the past 36 hours.  Please consider adding low dose basal insulin to improve glycemic control while inpatient.  Recommend Levemir 10 units daily.  Will continue to follow.  Thanks, Orlando Penner, RN, BSN, CCRN Diabetes Coordinator Inpatient Diabetes Program [phone]

## 2012-10-24 NOTE — Op Note (Signed)
EGD PROCEDURE REPORT  PATIENT:  Timothy Chan  MR#:  [id number] Birthdate:  1950-11-27, 62 y.o., male Endoscopist:  Dr. Malissa Hippo, MD Procedure Date: 10/24/2012  Procedure:   EGD with APC of GAVE.  Indications:  Patient is 62 year old Caucasian male with multiple medical problems including advanced liver disease complicated by hepatic encephalopathy who was developed melena and drop in his H&H. 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 He has not required PRBCs until this admission.            Informed Consent:  The risks, benefits, alternatives & imponderables which include, but are not limited to, bleeding, infection, perforation, drug reaction and potential missed lesion have been reviewed.  The potential for biopsy, lesion removal, esophageal dilation, etc. have also been discussed.  Questions have been answered.  All parties agreeable.  Please see history & physical in medical record for more information.  Medications:  Demerol 50 mg IV Versed 8 mg IV Cetacaine spray topically for oropharyngeal anesthesia  Description of procedure:  The endoscope was introduced through the mouth and advanced to the second portion of the duodenum without difficulty or limitations. The mucosal surfaces were surveyed very carefully during advancement of the scope and upon withdrawal.  Findings:  Esophagus:  Mucosa of the esophagus was normal. No varices is were identified. GEJ:  42 cm Stomach:  Stomach was empty and distended very well insufflation. Folds in the proximal stomach were normal. There was mosaic pattern and patchy erythema at gastric body and fundus. No fundal varices were identified. Multiple telangiectasia were noted at gastric antrum and two with active bleeding/oozing( GAVE). Duodenum:  Normal bulbar and post bulbar mucosa.  Therapeutic/Diagnostic Maneuvers Performed:   Multiple telangiectasia were ablated using argon plasma coagulator including the two that were  oozing blood. All of these lesions were not ablated.  Complications:  None  Impression: No evidence of esophageal or gastric varices. Mild portal gastropathy.  Multiple gastric antral vascular ectasia; 2 with active bleeding/oozing. Several of these lesions are ablated with APC including the ones that were bleeding.  Recommendations:  Patient will receive one unit of PRBCs today. Carafate suspension 1 g by mouth a.c. and each bedtime while he is in the hospital. Will plan to repeat session in 4-6 weeks.  REHMAN,NAJEEB U  10/24/2012  12:36 PM  CC: Dr. Ignatius Specking., MD & Dr. Bonnetta Barry ref. provider found

## 2012-10-24 NOTE — Care Management Note (Addendum)
    Page 1 of 1   10/25/2012     2:27:12 PM   CARE MANAGEMENT NOTE 10/25/2012  Patient:  Timothy Chan, Timothy Chan   Account Number:  [id number]  Date Initiated:  10/24/2012  Documentation initiated by:  Rosemary Holms  Subjective/Objective Assessment:   Pt admitted from home. Active with [facility]. To be dc'd home with resumption of HH. [facility] aware of pending DC.     Action/Plan:   Anticipated DC Date:  10/24/2012   Anticipated DC Plan:  HOME W HOME HEALTH SERVICES         Choice offered to / List presented to:             Status of service:  Completed, signed off Medicare Important Message given?  YES (If response is "NO", the following Medicare IM given date fields will be blank) Date Medicare IM given:  10/24/2012 Date Additional Medicare IM given:    Discharge Disposition:  HOME W HOME HEALTH SERVICES  Per UR Regulation:    If discussed at Long Length of Stay Meetings, dates discussed:    Comments:  10/25/12 Rosemary Holms RN BSN CN CM learned today that pt had actually signed out AMA late yesterday. [facility] [place] notified this am since pt was active with [facility] prior to admission.  10/24/12 Rosemary Holms RN BSN CM

## 2012-10-24 NOTE — Progress Notes (Signed)
Subjective: Interval History: has no complaint of nausea  or vomiting. His abdominal pain has improved. Presently he denies any difficulty in breathing.  Objective: Vital signs in last 24 hours: Temp:  [97.6 F (36.4 C)-98.3 F (36.8 C)] 98.1 F (36.7 C) (04/15 0404) Pulse Rate:  [59-81] 59 (04/15 0404) Resp:  [20] 20 (04/15 0404) BP: (91-135)/(41-82) 113/41 mmHg (04/15 0404) SpO2:  [99 %-100 %] 99 % (04/15 0404) Weight:  [102.513 kg (226 lb)] 102.513 kg (226 lb) (04/15 0453) Weight change: -1.387 kg (-3 lb 0.9 oz)  Intake/Output from previous day: 04/14 0701 - 04/15 0700 In: 565.3 [P.O.:350; I.V.:215.3] Out: 1003  Intake/Output this shift:    General appearance: alert, cooperative and no distress Resp: clear to auscultation bilaterally Cardio: regular rate and rhythm, S1, S2 normal, no murmur, click, rub or gallop GI: soft, non-tender; bowel sounds normal; no masses,  no organomegaly Extremities: extremities normal, atraumatic, no cyanosis or edema  Lab Results:  Recent Labs  10/23/12 0404 10/24/12 0525  WBC 10.7* 6.3  HGB 8.7* 7.9*  HCT 25.0* 23.2*  PLT 152 98*   BMET:  Recent Labs  10/23/12 0404 10/24/12 0525  NA 139 138  K 3.6 4.1  CL 100 102  CO2 23 28  GLUCOSE 218* 168*  BUN 82* 47*  CREATININE 7.77* 5.40*  CALCIUM 8.4 8.1*   No results found for this basename: PTH,  in the last 72 hours Iron Studies: No results found for this basename: IRON, TIBC, TRANSFERRIN, FERRITIN,  in the last 72 hours  Studies/Results: No results found.  I have reviewed the patient's current medications.  Assessment/Plan: Problem #1 end-stage renal disease patient is status post hemodialysis yesterday BUN is4 7 creatinine is 5.4 has improved. Potassium is 4.1. Problem #2 anemia his hemoglobin 7.9 hematocrit was 3.2 seems to be declining. Patient presently for possible endoscopy. Problem #3 diabetes his blood sugar seems to be reasonably controlled Problem #4  hypertension Problem #5 history of hepatic encephalopathy has improved. Problem #6 liver cirrhosis Problem #7 metabolic bone disease his phosphorus has improved. Plan: We'll make arrangements for patient to get dialysis tomorrow We'll check again his present metabolic panel and CBC in the morning We'll hold heparin during dialysis.    LOS: 4 days   Shady Padron S 10/24/2012,8:08 AM

## 2012-10-24 NOTE — Progress Notes (Signed)
[facility] PROGRESS NOTE  Timothy Chan ZOX:[id number] DOB: 03-01-1951 DOA: 10/20/2012 PCP: Ignatius Specking., MD  Assessment/Plan: 1. Hepatic encephalopathy. Mental status has improved to baseline. Continue lactulose. 2. Abdominal pain. Concern for underlying SBP. Started on cefotaxime per GI. Plan will be to continue total of 5 days of antibiotics. Today's day 4 of antibiotics. 3. Upper GI bleeding.  Patient had endoscopy today which revealed multiple gastric AVMs, 2 with active bleeding/oozing.  These were ablated with APC. Patient has been started on carafate.  He will need to follow up with Dr. Karilyn Cota in 4-6 weeks. Discussed with Dr. Karilyn Cota and since patient did show signs of active bleeding, recommendations are for continued observation overnight to ensure hemoglobin remains stable. 4. Acute blood loss Anemia. He is status post 2 units of PRBCs. With active bleeding/oozing and decline in hemoglobin, will give another unit of blood today. 5. End-stage renal disease on peritoneal dialysis. Patient is undergoing hemodialysis while here in the hospital. Not entirely clear his uremia is from GI bleeding versus worsening renal failure. He will continue on peritoneal dialysis on discharge. 6. Diabetes, blood sugars are stable. 7. Hypertension. Stable  Code Status: Full code Family Communication: No family at bedside Disposition Plan: With active bleeding requiring continued observation and need for another day of IV antibiotics, it was recommended to the patient to continue treatments in the hospital overnight and potentially discharge home tomorrow. The patient wants to discharge home today after his blood transfusion is complete. I reiterated the importance of continued observation overnight to watch for any recurrence of bleeding and need to complete IV antibiotics tomorrow. I also explained the risks of leaving the hospital prematurely including continued bleeding, decline in overall health and  even death.  Patient understands these risks and wishes to leave the hospital today after transfusion is complete.  I advised him that if he does leave, he would have to sign out against medical advice.   Consultants:  Nephrology,  Gastroenterology  Procedures:  Hemodialysis EGD:No evidence of esophageal or gastric varices.  Mild portal gastropathy.  Multiple gastric antral vascular ectasia; 2 with active bleeding/oozing. Several of these lesions are ablated with APC including the ones that were bleeding.    Antibiotics:  Cefotaxime 4/12  HPI/Subjective: Denies any complaints, wants to go home.  Objective: Filed Vitals:   10/24/12 1225 10/24/12 1230 10/24/12 1235 10/24/12 1258  BP: 105/36 104/47 108/57 115/51  Pulse: 74 76 75 74  Temp:    97.6 F (36.4 C)  TempSrc:    Oral  Resp: 15 16 11 16   Height:      Weight:      SpO2: 100% 100% 100% 99%    Intake/Output Summary (Last 24 hours) at 10/24/12 1519 Last data filed at 10/24/12 0607  Gross per 24 hour  Intake 565.34 ml  Output   1003 ml  Net -437.66 ml   Filed Weights   10/23/12 0440 10/24/12 0404 10/24/12 0453  Weight: 103.9 kg (229 lb 0.9 oz) 102.513 kg (226 lb) 102.513 kg (226 lb)    Exam:   General:  Awake, alert, no acute distress  Cardiovascular: S1, S2, regular rate and rhythm  Respiratory: clear bilaterally  Abdomen: Soft, distended, diffusely tender  Musculoskeletal: No lower extremity edema   Data Reviewed: Basic Metabolic Panel:  Recent Labs Lab 10/20/12 1915 10/21/12 1100 10/22/12 0718 10/23/12 0404 10/24/12 0525  NA 133* 134* 137 139 138  K 3.7 3.4* 3.7 3.6 4.1  CL 92* 93*  99 100 102  CO2 24 25 26 23 28   GLUCOSE 238* 112* 112* 218* 168*  BUN 99* 108* 63* 82* 47*  CREATININE 8.97* 9.38* 6.69* 7.77* 5.40*  CALCIUM 8.5 8.6 8.2* 8.4 8.1*  MG  --  2.4  --   --   --   PHOS  --   --  6.4* 6.7* 5.7*   Liver Function Tests:  Recent Labs Lab 10/20/12 1915 10/21/12 1100  AST  55* 51*  ALT 48 50  ALKPHOS 147* 117  BILITOT 0.5 0.9  PROT 5.8* 5.6*  ALBUMIN 2.0* 2.0*   No results found for this basename: LIPASE, AMYLASE,  in the last 168 hours  Recent Labs Lab 10/20/12 1910 10/21/12 1100  AMMONIA 146* 79*   CBC:  Recent Labs Lab 10/20/12 1915 10/21/12 1100 10/22/12 0718 10/23/12 0404 10/24/12 0525  WBC 9.9 9.5 9.9 10.7* 6.3  NEUTROABS 7.0  --   --   --   --   HGB 6.8* 9.0* 8.5* 8.7* 7.9*  HCT 19.8* 24.8* 24.2* 25.0* 23.2*  MCV 87.6 87.6 88.6 89.6 91.3  PLT 171 160 133* 152 98*   Cardiac Enzymes: No results found for this basename: CKTOTAL, CKMB, CKMBINDEX, TROPONINI,  in the last 168 hours BNP (last 3 results) No results found for this basename: PROBNP,  in the last 8760 hours CBG:  Recent Labs Lab 10/23/12 1620 10/23/12 2004 10/23/12 2343 10/24/12 0403 10/24/12 0734  GLUCAP 127* 310* 228* 169* 163*    No results found for this or any previous visit (from the past 240 hour(s)).   Studies: No results found.  Scheduled Meds: . antiseptic oral rinse  15 mL Mouth Rinse q12n4p  . cefoTAXime (CLAFORAN) IV  2 g Intravenous Q1200  . chlorhexidine  15 mL Mouth Rinse BID  . [START ON 10/25/2012] epoetin alfa  10,000 Units Intravenous 3 times weekly  . insulin aspart  0-9 Units Subcutaneous Q4H  . lactulose  30 g Oral BID  . meperidine      . metoprolol tartrate  25 mg Oral BID  . midazolam      . pantoprazole  40 mg Oral QAC breakfast  . sevelamer carbonate  1,600 mg Oral TID WC  . sodium chloride  3 mL Intravenous Q12H  . sucralfate  1 g Oral TID WC & HS   Continuous Infusions:   Principal Problem:   Hepatic encephalopathy Active Problems:   Alcoholic cirrhosis   Diabetes mellitus   End stage renal disease   ESRD on peritoneal dialysis   Anemia   Hyperammonemia   Altered mental status    Time spent:    [facility]  [facility] Pager [phone]. If 7PM-7AM, please contact night-coverage at  [email or web address], password [facility] 10/24/2012, 3:19 PM  LOS: 4 days

## 2012-10-26 ENCOUNTER — Inpatient Hospital Stay (HOSPITAL_COMMUNITY): Payer: Medicare Other

## 2012-10-26 ENCOUNTER — Emergency Department (HOSPITAL_COMMUNITY): Payer: Medicare Other

## 2012-10-26 ENCOUNTER — Inpatient Hospital Stay (HOSPITAL_COMMUNITY)
Admission: EM | Admit: 2012-10-26 | Discharge: 2012-11-02 | DRG: 871 | Disposition: A | Discharge status: Skilled Nursing Facility | Payer: Medicare Other | Attending: Internal Medicine | Admitting: Internal Medicine

## 2012-10-26 ENCOUNTER — Encounter (HOSPITAL_COMMUNITY): Payer: Self-pay | Admitting: Internal Medicine

## 2012-10-26 DIAGNOSIS — R6510 Systemic inflammatory response syndrome (SIRS) of non-infectious origin without acute organ dysfunction: Secondary | ICD-10-CM | POA: Diagnosis present

## 2012-10-26 DIAGNOSIS — A419 Sepsis, unspecified organism: Secondary | ICD-10-CM | POA: Diagnosis present

## 2012-10-26 DIAGNOSIS — K7030 Alcoholic cirrhosis of liver without ascites: Secondary | ICD-10-CM | POA: Diagnosis present

## 2012-10-26 DIAGNOSIS — K922 Gastrointestinal hemorrhage, unspecified: Secondary | ICD-10-CM

## 2012-10-26 DIAGNOSIS — T8571XA Infection and inflammatory reaction due to peritoneal dialysis catheter, initial encounter: Secondary | ICD-10-CM

## 2012-10-26 DIAGNOSIS — K658 Other peritonitis: Secondary | ICD-10-CM | POA: Diagnosis present

## 2012-10-26 DIAGNOSIS — E119 Type 2 diabetes mellitus without complications: Secondary | ICD-10-CM | POA: Diagnosis present

## 2012-10-26 DIAGNOSIS — E118 Type 2 diabetes mellitus with unspecified complications: Secondary | ICD-10-CM | POA: Diagnosis present

## 2012-10-26 DIAGNOSIS — K7290 Hepatic failure, unspecified without coma: Secondary | ICD-10-CM | POA: Diagnosis present

## 2012-10-26 DIAGNOSIS — I120 Hypertensive chronic kidney disease with stage 5 chronic kidney disease or end stage renal disease: Secondary | ICD-10-CM | POA: Diagnosis present

## 2012-10-26 DIAGNOSIS — Z992 Dependence on renal dialysis: Secondary | ICD-10-CM

## 2012-10-26 DIAGNOSIS — R5381 Other malaise: Secondary | ICD-10-CM | POA: Diagnosis present

## 2012-10-26 DIAGNOSIS — N62 Hypertrophy of breast: Secondary | ICD-10-CM | POA: Diagnosis present

## 2012-10-26 DIAGNOSIS — Z9119 Patient's noncompliance with other medical treatment and regimen: Secondary | ICD-10-CM

## 2012-10-26 DIAGNOSIS — R109 Unspecified abdominal pain: Secondary | ICD-10-CM | POA: Diagnosis present

## 2012-10-26 DIAGNOSIS — IMO0002 Reserved for concepts with insufficient information to code with codable children: Secondary | ICD-10-CM | POA: Diagnosis present

## 2012-10-26 DIAGNOSIS — D72829 Elevated white blood cell count, unspecified: Secondary | ICD-10-CM | POA: Diagnosis present

## 2012-10-26 DIAGNOSIS — G629 Polyneuropathy, unspecified: Secondary | ICD-10-CM

## 2012-10-26 DIAGNOSIS — R4182 Altered mental status, unspecified: Secondary | ICD-10-CM

## 2012-10-26 DIAGNOSIS — D649 Anemia, unspecified: Secondary | ICD-10-CM | POA: Diagnosis present

## 2012-10-26 DIAGNOSIS — K7682 Hepatic encephalopathy: Secondary | ICD-10-CM | POA: Diagnosis present

## 2012-10-26 DIAGNOSIS — F1020 Alcohol dependence, uncomplicated: Secondary | ICD-10-CM | POA: Diagnosis present

## 2012-10-26 DIAGNOSIS — K652 Spontaneous bacterial peritonitis: Secondary | ICD-10-CM

## 2012-10-26 DIAGNOSIS — Z87891 Personal history of nicotine dependence: Secondary | ICD-10-CM

## 2012-10-26 DIAGNOSIS — E7220 Disorder of urea cycle metabolism, unspecified: Secondary | ICD-10-CM | POA: Diagnosis present

## 2012-10-26 DIAGNOSIS — Y841 Kidney dialysis as the cause of abnormal reaction of the patient, or of later complication, without mention of misadventure at the time of the procedure: Secondary | ICD-10-CM | POA: Diagnosis present

## 2012-10-26 DIAGNOSIS — N186 End stage renal disease: Secondary | ICD-10-CM | POA: Diagnosis present

## 2012-10-26 DIAGNOSIS — N2581 Secondary hyperparathyroidism of renal origin: Secondary | ICD-10-CM | POA: Diagnosis present

## 2012-10-26 DIAGNOSIS — H3321 Serous retinal detachment, right eye: Secondary | ICD-10-CM

## 2012-10-26 DIAGNOSIS — B372 Candidiasis of skin and nail: Secondary | ICD-10-CM | POA: Diagnosis present

## 2012-10-26 DIAGNOSIS — Z91199 Patient's noncompliance with other medical treatment and regimen due to unspecified reason: Secondary | ICD-10-CM

## 2012-10-26 DIAGNOSIS — Z66 Do not resuscitate: Secondary | ICD-10-CM | POA: Diagnosis present

## 2012-10-26 DIAGNOSIS — D689 Coagulation defect, unspecified: Secondary | ICD-10-CM | POA: Diagnosis present

## 2012-10-26 DIAGNOSIS — H3320 Serous retinal detachment, unspecified eye: Secondary | ICD-10-CM | POA: Diagnosis present

## 2012-10-26 DIAGNOSIS — K769 Liver disease, unspecified: Secondary | ICD-10-CM | POA: Diagnosis present

## 2012-10-26 DIAGNOSIS — D62 Acute posthemorrhagic anemia: Secondary | ICD-10-CM

## 2012-10-26 DIAGNOSIS — D6959 Other secondary thrombocytopenia: Secondary | ICD-10-CM | POA: Diagnosis present

## 2012-10-26 DIAGNOSIS — D696 Thrombocytopenia, unspecified: Secondary | ICD-10-CM | POA: Diagnosis not present

## 2012-10-26 LAB — CBC WITH DIFFERENTIAL/PLATELET
Basophils Relative: 0 % (ref 0–1)
HCT: 33.8 % — ABNORMAL LOW (ref 39.0–52.0)
Hemoglobin: 11.4 g/dL — ABNORMAL LOW (ref 13.0–17.0)
MCH: 31.4 pg (ref 26.0–34.0)
MCHC: 33.7 g/dL (ref 30.0–36.0)
MCV: 93.1 fL (ref 78.0–100.0)
Monocytes Absolute: 0.8 10*3/uL (ref 0.1–1.0)
Monocytes Relative: 4 % (ref 3–12)
Neutro Abs: 15.5 10*3/uL — ABNORMAL HIGH (ref 1.7–7.7)

## 2012-10-26 LAB — COMPREHENSIVE METABOLIC PANEL
Albumin: 2.4 g/dL — ABNORMAL LOW (ref 3.5–5.2)
BUN: 63 mg/dL — ABNORMAL HIGH (ref 6–23)
Chloride: 94 mEq/L — ABNORMAL LOW (ref 96–112)
Creatinine, Ser: 7.55 mg/dL — ABNORMAL HIGH (ref 0.50–1.35)
GFR calc non Af Amer: 7 mL/min — ABNORMAL LOW (ref >90)
Total Bilirubin: 1.3 mg/dL — ABNORMAL HIGH (ref 0.3–1.2)

## 2012-10-26 LAB — LACTIC ACID, PLASMA: Lactic Acid, Venous: 2.9 mmol/L — ABNORMAL HIGH (ref 0.5–2.2)

## 2012-10-26 LAB — TROPONIN I: Troponin I: <0.3 ng/mL (ref <0.30)

## 2012-10-26 MED ORDER — MORPHINE SULFATE 2 MG/ML IJ SOLN
2.0000 mg | Freq: Once | INTRAMUSCULAR | Status: AC
  Administered 2012-10-26: 2 mg via INTRAVENOUS
  Filled 2012-10-26: qty 1

## 2012-10-26 MED ORDER — IOHEXOL 300 MG/ML  SOLN: 50.0000 mL | Freq: Once | INTRAMUSCULAR | Status: AC | PRN

## 2012-10-26 MED ORDER — METOPROLOL SUCCINATE ER 25 MG PO TB24
25.0000 mg | ORAL_TABLET | Freq: Every day | ORAL | Status: DC
  Administered 2012-10-27 – 2012-10-31 (×5): 25 mg via ORAL
  Filled 2012-10-26 (×5): qty 1

## 2012-10-26 MED ORDER — PANTOPRAZOLE SODIUM 40 MG IV SOLR
40.0000 mg | Freq: Once | INTRAVENOUS | Status: AC
  Administered 2012-10-26: 40 mg via INTRAVENOUS
  Filled 2012-10-26: qty 40

## 2012-10-26 MED ORDER — DEXTROSE 5 % IV SOLN
INTRAVENOUS | Status: AC
  Filled 2012-10-26: qty 10

## 2012-10-26 MED ORDER — RIFAXIMIN 550 MG PO TABS
ORAL_TABLET | ORAL | Status: AC
  Filled 2012-10-26: qty 1

## 2012-10-26 MED ORDER — SODIUM CHLORIDE 0.9 % IV BOLUS (SEPSIS)
500.0000 mL | Freq: Once | INTRAVENOUS | Status: AC
  Administered 2012-10-26: 18:00:00 via INTRAVENOUS

## 2012-10-26 MED ORDER — PANTOPRAZOLE SODIUM 40 MG IV SOLR
40.0000 mg | Freq: Two times a day (BID) | INTRAVENOUS | Status: DC
  Administered 2012-10-26 – 2012-10-30 (×8): 40 mg via INTRAVENOUS
  Filled 2012-10-26 (×8): qty 40

## 2012-10-26 MED ORDER — ONDANSETRON HCL 4 MG/2ML IJ SOLN
4.0000 mg | Freq: Once | INTRAMUSCULAR | Status: AC
  Administered 2012-10-26: 4 mg via INTRAVENOUS
  Filled 2012-10-26: qty 2

## 2012-10-26 MED ORDER — DEXTROSE 5 % IV SOLN
1.0000 g | INTRAVENOUS | Status: DC
  Administered 2012-10-26: 1 g via INTRAVENOUS
  Filled 2012-10-26: qty 10

## 2012-10-26 MED ORDER — RIFAXIMIN 550 MG PO TABS
275.0000 mg | ORAL_TABLET | Freq: Two times a day (BID) | ORAL | Status: DC
  Administered 2012-10-26 – 2012-11-02 (×13): 275 mg via ORAL
  Filled 2012-10-26 (×16): qty 1

## 2012-10-26 MED ORDER — TRAMADOL HCL 50 MG PO TABS
50.0000 mg | ORAL_TABLET | Freq: Four times a day (QID) | ORAL | Status: DC | PRN
  Administered 2012-10-27 – 2012-11-02 (×12): 50 mg via ORAL
  Filled 2012-10-26 (×12): qty 1

## 2012-10-26 MED ORDER — LACTULOSE 10 GM/15ML PO SOLN
20.0000 g | Freq: Four times a day (QID) | ORAL | Status: DC | PRN
  Administered 2012-10-26 – 2012-10-28 (×6): 20 g via ORAL
  Filled 2012-10-26 (×5): qty 30

## 2012-10-26 NOTE — ED Notes (Signed)
Patient requesting pain medication for his stomach.  Patient moaning in pain.  Dr. Adriana Simas aware and orders received.  Morphine order clarified.

## 2012-10-26 NOTE — ED Notes (Signed)
Pt moved from exam 9 to exam 2,

## 2012-10-26 NOTE — ED Notes (Addendum)
Attempted IV access to right AC without success, pt refuses to allow nurse to get IV access other than back of right hand, EDP made aware, assessed back of hand and nothing seen, pt with bruising to back of hand, another nurse to attempt IV access

## 2012-10-26 NOTE — ED Notes (Signed)
Patient resting with eyes closed.  Patient awaiting admission to stepdown unit.  Report called to Nicole Cella, Charity fundraiser.

## 2012-10-26 NOTE — ED Notes (Signed)
nt to bedside to obtain urine specimen, dr. Adriana Simas stated just wait that pt was too sick for that at present.

## 2012-10-26 NOTE — ED Notes (Signed)
Patient repositioned in bed.  Patient continues to c/o abdominal pain.  Patient continues to be tachycardic.

## 2012-10-26 NOTE — ED Provider Notes (Signed)
History    This chart was scribed for Donnetta Hutching, MD by Donne Anon, ED Scribe. This patient was seen in room [facility]09/[facility]09 and the patient's care was started at 1612.   CSN: [id number]  Arrival date & time 10/26/12  1601   First MD Initiated Contact with Patient 10/26/12 1612      Chief Complaint  Patient presents with  . Abdominal Pain   Level V caveat for urgent need for intervention  The history is provided by the patient. No language interpreter was used.   Timothy Chan is a 62 y.o. male brought in by ambulance, who presents to the Emergency Department complaining of gradual onset, constant, moderate, generalized abdominal pain that began this morning. Pt currently receives home dialysis on Mondays, Wednesdays and Fridays. Pt was seen in the ED earlier this week for constipation. Per EMS, pt reports associated melena, dark urine, CBG 454.    Past Medical History  Diagnosis Date  . Rectal bleeding   . Dialysis care   . GI bleed   . Hypertension   . Esophageal varices   . Alcoholic liver disease   . Hepatic encephalopathy   . Ascites   . Diabetes mellitus   . Detached retina   . Renal disorder     Past Surgical History  Procedure Laterality Date  . Esophagogastroduodenoscopy  12/31/2010    EGD [facility] THERAPY  . Esophagogastroduodenoscopy  05/31/2012E    GD [facility] ABLATION  . Small bowel givens  12/01/2010  . Colonoscopy  09/07/2010  . Esophagogastroduodenoscopy  09/05/2010    OUTLAW  . Lung surgery  6/11    [place]  . Esophagogastroduodenoscopy  03/26/2011    Procedure: ESOPHAGOGASTRODUODENOSCOPY (EGD);  Surgeon: Malissa Hippo, MD;  Location: [facility];  Service: Endoscopy;  Laterality: N/A;  8:30 am  . Hot hemostasis  03/26/2011    Procedure: HOT HEMOSTASIS (ARGON PLASMA COAGULATION/BICAP);  Surgeon: Malissa Hippo, MD;  Location: [facility];  Service: Endoscopy;  Laterality: N/A;  . Stent in liver    . Esophagogastroduodenoscopy N/A 10/24/2012   Procedure: ESOPHAGOGASTRODUODENOSCOPY (EGD);  Surgeon: Malissa Hippo, MD;  Location: [facility];  Service: Endoscopy;  Laterality: N/A;    History reviewed. No pertinent family history.  History  Substance Use Topics  . Smoking status: Former Smoker -- 0.50 packs/day    Types: Cigarettes    Quit date: 08/11/2011  . Smokeless tobacco: Former Neurosurgeon    Quit date: 05/12/2012  . Alcohol Use: No     Comment: denies      Review of Systems  Unable to perform ROS: Acuity of condition  All other systems reviewed and are negative.    Allergies  Tylenol and Morphine and related  Home Medications   Current Outpatient Rx  Name  Route  Sig  Dispense  Refill  . Bisacodyl (LAXATIVE PO)   Oral   Take 1 tablet by mouth daily as needed. For relief         . esomeprazole (NEXIUM) 40 MG capsule   Oral   Take 40 mg by mouth daily as needed. For indigestion         . furosemide (LASIX) 80 MG tablet   Oral   Take 80 mg by mouth 2 (two) times daily.         [name] glipiZIDE (GLUCOTROL) 5 MG tablet   Oral   Take 10 mg by mouth 2 (two) times daily. At 6 am and 5 pm         .  lactulose (CHRONULAC) 10 GM/15ML solution   Oral   Take 10 g by mouth 2 (two) times daily.          [name] lanthanum (FOSRENOL) 1000 MG chewable tablet   Oral   Chew 500-1,000 mg by mouth 3 (three) times daily with meals. Patient states that he chew 1 tablet with each meal , and 1/2 tablet with snack         . lidocaine-prilocaine (EMLA) cream   Topical   Apply 1 application topically as needed. Rub on arm PRIOR TO DIALYSIS on Mondays, Wednesdays, and Fridays.         . metoprolol succinate (TOPROL-XL) 25 MG 24 hr tablet   Oral   Take 25 mg by mouth daily.         . multivitamin (RENA-VIT) TABS tablet   Oral   Take 1 tablet by mouth daily.         . ondansetron (ZOFRAN) 4 MG tablet   Oral   Take 4 mg by mouth every 12 (twelve) hours as needed. FOR NAUSEA         . potassium chloride  (K-DUR) 10 MEQ tablet   Oral   Take 30 mEq by mouth See admin instructions. Takes 30 meq post dialysis on Monday, Wednesday, and Friday.         . rifaximin (XIFAXAN) 550 MG TABS   Oral   Take 275 mg by mouth 2 (two) times daily. Patient takes 1/2 pill in the morning and 1/2 pill at night         . traMADol (ULTRAM) 50 MG tablet   Oral   Take 50 mg by mouth every 6 (six) hours as needed. For pain           BP 146/80  Pulse 135  Temp(Src) 97.6 F (36.4 C) (Oral)  Resp 20  Ht 6\' 3"  (1.905 m)  Wt 223 lb (101.152 kg)  BMI 27.87 kg/m2  SpO2 100%  Physical Exam  Nursing note and vitals reviewed. Constitutional: He is oriented to person, place, and time. He appears well-developed and well-nourished.  Pale and dusky.  HENT:  Head: Normocephalic and atraumatic.  Eyes: Conjunctivae and EOM are normal. Pupils are equal, round, and reactive to light.  Neck: Normal range of motion. Neck supple.  Cardiovascular: Regular rhythm and normal heart sounds.  Tachycardia present.   Tachycardic  Pulmonary/Chest: Effort normal and breath sounds normal.  Abdominal: Soft. Bowel sounds are normal. There is tenderness (diffusely tender).  Abdomen in protruding.  Musculoskeletal: Normal range of motion.  Neurological: He is alert and oriented to person, place, and time.  Skin: Skin is warm and dry.  Psychiatric: He has a normal mood and affect.    ED Course  Procedures (including critical care time) DIAGNOSTIC STUDIES: Oxygen Saturation is 100% on room air, normal by my interpretation.    COORDINATION OF CARE: 4:20 PM Discussed treatment plan which includes reviewing past medical records with pt at bedside and pt agreed to plan.  5:26 PM Rechecked pt and performed rectal exam. There was brown stool and no mass present.  Results for orders placed during the hospital encounter of 10/26/12  CBC WITH DIFFERENTIAL      Result Value Range   WBC 17.3 (*) 4.0 - 10.5 K/uL   RBC 3.63 (*) 4.22  - 5.81 MIL/uL   Hemoglobin 11.4 (*) 13.0 - 17.0 g/dL   HCT 16.1 (*) 09.6 - 04.5 %   MCV 93.1  78.0 - 100.0 fL   MCH 31.4  26.0 - 34.0 pg   MCHC 33.7  30.0 - 36.0 g/dL   RDW 16.1 (*) 09.6 - 04.5 %   Platelets 149 (*) 150 - 400 K/uL   Neutrophils Relative 89 (*) 43 - 77 %   Neutro Abs 15.5 (*) 1.7 - 7.7 K/uL   Lymphocytes Relative 6 (*) 12 - 46 %   Lymphs Abs 1.0  0.7 - 4.0 K/uL   Monocytes Relative 4  3 - 12 %   Monocytes Absolute 0.8  0.1 - 1.0 K/uL   Eosinophils Relative 0  0 - 5 %   Eosinophils Absolute 0.0  0.0 - 0.7 K/uL   Basophils Relative 0  0 - 1 %   Basophils Absolute 0.0  0.0 - 0.1 K/uL  COMPREHENSIVE METABOLIC PANEL      Result Value Range   Sodium 137  135 - 145 mEq/L   Potassium 4.1  3.5 - 5.1 mEq/L   Chloride 94 (*) 96 - 112 mEq/L   CO2 29  19 - 32 mEq/L   Glucose, Bld 390 (*) 70 - 99 mg/dL   BUN 63 (*) 6 - 23 mg/dL   Creatinine, Ser 4.09 (*) 0.50 - 1.35 mg/dL   Calcium 9.2  8.4 - 81.1 mg/dL   Total Protein 7.0  6.0 - 8.3 g/dL   Albumin 2.4 (*) 3.5 - 5.2 g/dL   AST 28  0 - 37 U/L   ALT 35  0 - 53 U/L   Alkaline Phosphatase 164 (*) 39 - 117 U/L   Total Bilirubin 1.3 (*) 0.3 - 1.2 mg/dL   GFR calc non Af Amer 7 (*) >90 mL/min   GFR calc Af Amer 8 (*) >90 mL/min  TROPONIN I      Result Value Range   Troponin I <0.30  <0.30 ng/mL  AMMONIA      Result Value Range   Ammonia 119 (*) 11 - 60 umol/L     Dg Chest Portable 1 View  10/26/2012  *RADIOLOGY REPORT*  Clinical Data: Abdominal pain  PORTABLE CHEST - 1 VIEW  Comparison: 4/11/ 14  Findings: Cardiomegaly again noted.  No acute infiltrate or pulmonary edema.  Stable right basilar scarring.  Mild hyperinflation.  IMPRESSION: .  Cardiomegaly.  Stable right basilar scarring.  No acute infiltrate or pulmonary edema.   Original Report Authenticated By: Natasha Mead, M.D.     Labs Reviewed - No data to display No results found.   No diagnosis found.  CRITICAL CARE Performed by: Donnetta Hutching  ?  Total critical  care time: 40  Critical care time was exclusive of separately billable procedures and treating other patients.  Critical care was necessary to treat or prevent imminent or life-threatening deterioration.  Critical care was time spent personally by me on the following activities: development of treatment plan with patient and/or surrogate as well as nursing, discussions with consultants, evaluation of patient's response to treatment, examination of patient, obtaining history from patient or surrogate, ordering and performing treatments and interventions, ordering and review of laboratory studies, ordering and review of radiographic studies, pulse oximetry and re-evaluation of patient's condition.  MDM   Patient is very ill. Just discharged from the hospital. He is tachycardic, and heme positive stools, multitude of health problems.  Discussed the critical nature of his illness with his son at [phone]   I personally performed the services described in this documentation, which was scribed in my presence.  The recorded information has been reviewed and is accurate.         Donnetta Hutching, MD 10/26/12 [phone]

## 2012-10-26 NOTE — ED Notes (Signed)
Pt resting in bed, voice sno complaints, remains tachycardic, md aware.

## 2012-10-26 NOTE — H&P (Signed)
[facility] History and Physical  Timothy Chan  ZOX:[id number]  DOB: [birth year]   DOA: 10/26/2012   PCP:   Ignatius Specking., MD   Chief Complaint:  Abdominal pain and lethargy  HPI: Timothy Chan is an 61 y.o. male.   Caucasian gentleman with liver cirrhosis status post TIPS, end-stage renal disease on daily peritoneal dialysis, diabetes and hypertension, discharge from the service 2 days ago after treatment for hepatic encephalopathy associated with GI bleed and possible SBP; he received 3 units of packed red cells, and a 5 day course of his cefotaxime prior to discharge.  Patient lives alone and has been administering his own medications; he is brought in by relatives to report progressive lethargy and he complains of unbearable at abdominal pain, and episodes of nonbloody vomiting today. Although his history is not completely reliable he reports his dialyzed himself twice since discharge, he reports he has been taking all his medications, but had no stool yesterday and only one stool today.  In the emergency room he was ill-looking, lethargic, in pain and persistently tachycardic, and his ammonia levels were elevated; the hospitalist service was called to assist.  Rewiew of Systems:   All systems negative except as marked bold or noted in the HPI;  Constitutional:    malaise, fever and chills. ;  Eyes:   eye pain, redness and discharge. ;  ENMT:   ear pain, hoarseness, nasal congestion, sinus pressure and sore throat. ;  Cardiovascular:    chest pain, palpitations, diaphoresis, dyspnea and peripheral edema.  Respiratory:   cough, hemoptysis, wheezing and stridor. ;  Gastrointestinal:  nausea, vomiting, diarrhea, constipation, abdominal pain, melena, blood in stool, hematemesis, jaundice and rectal bleeding. unusual weight loss..   Genitourinary:    frequency, dysuria, incontinence,flank pain and hematuria; Musculoskeletal:   back pain and neck pain.  swelling and trauma.;  Skin: .   pruritus, rash, abrasions, bruising and skin lesion.; ulcerations Neuro:    headache, lightheadedness and neck stiffness.  weakness, altered level of consciousness, altered mental status, extremity weakness, burning feet, involuntary movement, seizure and syncope.  Psych:    anxiety, depression, insomnia, tearfulness, panic attacks, hallucinations, paranoia, suicidal or homicidal ideation    Past Medical History  Diagnosis Date  . Rectal bleeding   . Dialysis care   . GI bleed   . Hypertension   . Esophageal varices   . Alcoholic liver disease   . Hepatic encephalopathy   . Ascites   . Diabetes mellitus   . Detached retina   . Renal disorder     Past Surgical History  Procedure Laterality Date  . Esophagogastroduodenoscopy  12/31/2010    EGD [facility] THERAPY  . Esophagogastroduodenoscopy  05/31/2012E    GD [facility] ABLATION  . Small bowel givens  12/01/2010  . Colonoscopy  09/07/2010  . Esophagogastroduodenoscopy  09/05/2010    OUTLAW  . Lung surgery  6/11    [place]  . Esophagogastroduodenoscopy  03/26/2011    Procedure: ESOPHAGOGASTRODUODENOSCOPY (EGD);  Surgeon: Malissa Hippo, MD;  [facility];  Service: Endoscopy;  Laterality: N/A;  8:30 am  . Hot hemostasis  03/26/2011    Procedure: HOT HEMOSTASIS (ARGON PLASMA COAGULATION/BICAP);  Surgeon: Malissa Hippo, MD;  [facility];  Service: Endoscopy;  Laterality: N/A;  . Stent in liver    . Esophagogastroduodenoscopy N/A 10/24/2012    Procedure: ESOPHAGOGASTRODUODENOSCOPY (EGD);  Surgeon: Malissa Hippo, MD;  [facility];  Service: Endoscopy;  Laterality:  N/A;    Medications:  HOME MEDS: Prior to Admission medications   Medication Sig Start Date End Date Taking? Authorizing Provider  Bisacodyl (LAXATIVE PO) Take 1 tablet by mouth daily as needed. For relief   Yes Historical Provider, MD  esomeprazole (NEXIUM) 40 MG capsule Take 40 mg by mouth daily as needed. For indigestion   Yes  Historical Provider, MD  furosemide (LASIX) 80 MG tablet Take 80 mg by mouth 2 (two) times daily.   Yes Historical Provider, MD  glipiZIDE (GLUCOTROL) 5 MG tablet Take 10 mg by mouth 2 (two) times daily. At 6 am and 5 pm   Yes Historical Provider, MD  lactulose (CHRONULAC) 10 GM/15ML solution Take 10 g by mouth 2 (two) times daily.  04/10/12  Yes Malissa Hippo, MD  lanthanum (FOSRENOL) 1000 MG chewable tablet Chew 500-1,000 mg by mouth 3 (three) times daily with meals. Patient states that he chew 1 tablet with each meal , and 1/2 tablet with snack   Yes Historical Provider, MD  lidocaine-prilocaine (EMLA) cream Apply 1 application topically as needed. Rub on arm PRIOR TO DIALYSIS on Mondays, Wednesdays, and Fridays.   Yes Historical Provider, MD  metoprolol succinate (TOPROL-XL) 25 MG 24 hr tablet Take 25 mg by mouth daily.   Yes Historical Provider, MD  multivitamin (RENA-VIT) TABS tablet Take 1 tablet by mouth daily.   Yes Historical Provider, MD  ondansetron (ZOFRAN) 4 MG tablet Take 4 mg by mouth every 12 (twelve) hours as needed. FOR NAUSEA   Yes Historical Provider, MD  potassium chloride (K-DUR) 10 MEQ tablet Take 30 mEq by mouth See admin instructions. Takes 30 meq post dialysis on Monday, Wednesday, and Friday.   Yes Historical Provider, MD  rifaximin (XIFAXAN) 550 MG TABS Take 275 mg by mouth 2 (two) times daily. Patient takes 1/2 pill in the morning and 1/2 pill at night 04/10/12  Yes Malissa Hippo, MD  traMADol (ULTRAM) 50 MG tablet Take 50 mg by mouth every 6 (six) hours as needed. For pain   Yes Historical Provider, MD     Allergies:  Allergies  Allergen Reactions  . Tylenol (Acetaminophen) Other (See Comments)    cirrhosis  . Morphine And Related Itching    Social History:   reports that he quit smoking about 14 months ago. His smoking use included Cigarettes. He smoked 0.50 packs per day. He quit smokeless tobacco use about 5 months ago. He reports that he does not drink  alcohol or use illicit drugs.  Family History: History reviewed. No pertinent family history. Not reviewed because of patient's acute illness and need to prioritize care  Physical Exam: Filed Vitals:   10/26/12 1749 10/26/12 2022 10/26/12 2054 10/26/12 2220  BP: 108/69 108/52 93/43   Pulse: 130 131 130   Temp:   98.7 F (37.1 C) 98 F (36.7 C)  TempSrc:   Oral Oral  Resp: 20 34 22   Height:    6\' 3"  (1.905 m)  Weight:    104.3 kg (229 lb 15 oz)  SpO2: 100% 99% 2%    Blood pressure 93/43, pulse 130, temperature 98 F (36.7 C), temperature source Oral, resp. rate 22, height 6\' 3"  (1.905 m), weight 104.3 kg (229 lb 15 oz), SpO2 2.00%.  GEN:  Pleasant but ill-looking Caucasian gentleman lying bed ; unable to be fully cooperative with exam PSYCH:  alert and oriented x;  anxious or depressed; affect is appropriate. HEENT: Mucous membranes pink, dry and  anicteric; PERRLA; EOM intact; no cervical lymphadenopathy nor thyromegaly or carotid bruit; no JVD; Breasts:: Not examined CHEST WALL: No tenderness CHEST: Normal respiration, clear to auscultation bilaterally HEART: Tachycardic regular rate and rhythm; no murmurs rubs or gallops BACK: No kyphosis no scoliosis; no CVA tenderness ABDOMEN: Abdomen distended and tender; peritoneal dialysis catheter site clean dry, increased abdominal bowel sounds;no intertriginous candida. Rectal Exam: Brown stool heme positive per ER physician EXTREMITIES: No bone or joint deformity; age-appropriate arthropathy of the hands and knees; no edema; no ulcerations. Genitalia: not examined PULSES: 2+ and symmetric SKIN: Dry skin; no ulcerations CNS: Cranial nerves 2-12 grossly intact no focal lateralizing neurologic deficit   Labs on Admission:  Basic Metabolic Panel:  Recent Labs Lab 10/21/12 1100 10/22/12 0718 10/23/12 0404 10/24/12 0525 10/26/12 1634  NA 134* 137 139 138 137  K 3.4* 3.7 3.6 4.1 4.1  CL 93* 99 100 102 94*  CO2 25 26 23 28 29    GLUCOSE 112* 112* 218* 168* 390*  BUN 108* 63* 82* 47* 63*  CREATININE 9.38* 6.69* 7.77* 5.40* 7.55*  CALCIUM 8.6 8.2* 8.4 8.1* 9.2  MG 2.4  --   --   --   --   PHOS  --  6.4* 6.7* 5.7*  --    Liver Function Tests:  Recent Labs Lab 10/20/12 1915 10/21/12 1100 10/26/12 1634  AST 55* 51* 28  ALT 48 50 35  ALKPHOS 147* 117 164*  BILITOT 0.5 0.9 1.3*  PROT 5.8* 5.6* 7.0  ALBUMIN 2.0* 2.0* 2.4*   No results found for this basename: LIPASE, AMYLASE,  in the last 168 hours  Recent Labs Lab 10/20/12 1910 10/21/12 1100 10/26/12 1825  AMMONIA 146* 79* 119*   CBC:  Recent Labs Lab 10/20/12 1915 10/21/12 1100 10/22/12 0718 10/23/12 0404 10/24/12 0525 10/26/12 1634  WBC 9.9 9.5 9.9 10.7* 6.3 17.3*  NEUTROABS 7.0  --   --   --   --  15.5*  HGB 6.8* 9.0* 8.5* 8.7* 7.9* 11.4*  HCT 19.8* 24.8* 24.2* 25.0* 23.2* 33.8*  MCV 87.6 87.6 88.6 89.6 91.3 93.1  PLT 171 160 133* 152 98* 149*   Cardiac Enzymes:  Recent Labs Lab 10/26/12 1634  TROPONINI <0.30   BNP: No components found with this basename: POCBNP,  D-dimer: No components found with this basename: D-DIMER,  CBG:  Recent Labs Lab 10/23/12 2343 10/24/12 0403 10/24/12 0734 10/24/12 1711 10/24/12 2006  GLUCAP 228* 169* 163* 284* 265*    Radiological Exams on Admission: Dg Chest Portable 1 View  10/26/2012  *RADIOLOGY REPORT*  Clinical Data: Abdominal pain  PORTABLE CHEST - 1 VIEW  Comparison: 4/11/ 14  Findings: Cardiomegaly again noted.  No acute infiltrate or pulmonary edema.  Stable right basilar scarring.  Mild hyperinflation.  IMPRESSION: .  Cardiomegaly.  Stable right basilar scarring.  No acute infiltrate or pulmonary edema.   Original Report Authenticated By: Natasha Mead, M.D.     EKG: Independently reviewed. Sinus tachycardia   Assessment/Plan Present on Admission:  . SIRS (systemic inflammatory response syndrome) . Hyperammonemia . Hepatic encephalopathy . ESRD on peritoneal dialysis .  Diabetes mellitus . Abdominal pain . SBP (spontaneous bacterial peritonitis) . Alcoholic cirrhosis  PLAN: Admit this gentleman to step down unit for further treatment and evaluation; the source of his infection is not clear but we will presume peritoneal and empirically start Rocephin; will get a CT scan of his abdomen because his abdomen is tender; will attempt to get a urinalysis  he passes urine only once per day typically;  He is able to take lactulose by mouth will give increased doses 4 times and monitor mental status;  Will keep him n.p.o. for the time being and give sliding scale to blood sugars pending dialysis in the morning  Consult nephrologist for dialysis assistance with management of metabolic derangement;   Consult GI service for assistance with management  Other plans as per orders.  Code Status: He reiterates that he wants to be a full code Family Communication: Her father and son at bedside Disposition Plan: Continue in intensive care unit  Critical care time: 60 minutes.   Dimple Bastyr Nocturnist [facility] Pager [phone]   10/26/2012, 11:00 PM

## 2012-10-26 NOTE — ED Notes (Signed)
Pt c/o abd pain since this morning, does home dialysis and has hemodialysis, black tarry stool, urine dark, cbg 454 per EMS, here recently for blood transfusion

## 2012-10-27 ENCOUNTER — Encounter (HOSPITAL_COMMUNITY): Payer: Self-pay | Admitting: *Deleted

## 2012-10-27 DIAGNOSIS — K7030 Alcoholic cirrhosis of liver without ascites: Secondary | ICD-10-CM

## 2012-10-27 DIAGNOSIS — R4182 Altered mental status, unspecified: Secondary | ICD-10-CM

## 2012-10-27 DIAGNOSIS — R109 Unspecified abdominal pain: Secondary | ICD-10-CM

## 2012-10-27 DIAGNOSIS — K769 Liver disease, unspecified: Secondary | ICD-10-CM

## 2012-10-27 DIAGNOSIS — K7290 Hepatic failure, unspecified without coma: Secondary | ICD-10-CM

## 2012-10-27 DIAGNOSIS — K652 Spontaneous bacterial peritonitis: Secondary | ICD-10-CM

## 2012-10-27 LAB — COMPREHENSIVE METABOLIC PANEL
ALT: 25 U/L (ref 0–53)
AST: 27 U/L (ref 0–37)
Albumin: 1.6 g/dL — ABNORMAL LOW (ref 3.5–5.2)
Alkaline Phosphatase: 114 U/L (ref 39–117)
BUN: 72 mg/dL — ABNORMAL HIGH (ref 6–23)
CO2: 24 mEq/L (ref 19–32)
Calcium: 8.2 mg/dL — ABNORMAL LOW (ref 8.4–10.5)
Chloride: 96 mEq/L (ref 96–112)
Creatinine, Ser: 7.96 mg/dL — ABNORMAL HIGH (ref 0.50–1.35)
GFR calc Af Amer: 7 mL/min — ABNORMAL LOW (ref >90)
GFR calc non Af Amer: 6 mL/min — ABNORMAL LOW (ref >90)
Glucose, Bld: 402 mg/dL — ABNORMAL HIGH (ref 70–99)
Potassium: 4.7 mEq/L (ref 3.5–5.1)
Sodium: 135 mEq/L (ref 135–145)
Total Bilirubin: 0.6 mg/dL (ref 0.3–1.2)
Total Protein: 5.2 g/dL — ABNORMAL LOW (ref 6.0–8.3)

## 2012-10-27 LAB — CBC
HCT: 27.3 % — ABNORMAL LOW (ref 39.0–52.0)
Hemoglobin: 9.3 g/dL — ABNORMAL LOW (ref 13.0–17.0)
MCH: 32 pg (ref 26.0–34.0)
MCHC: 34.1 g/dL (ref 30.0–36.0)
MCV: 93.8 fL (ref 78.0–100.0)
Platelets: 117 10*3/uL — ABNORMAL LOW (ref 150–400)
RBC: 2.91 MIL/uL — ABNORMAL LOW (ref 4.22–5.81)
RDW: 16.9 % — ABNORMAL HIGH (ref 11.5–15.5)
WBC: 27.7 10*3/uL — ABNORMAL HIGH (ref 4.0–10.5)

## 2012-10-27 LAB — GLUCOSE, CAPILLARY
Glucose-Capillary: 390 mg/dL — ABNORMAL HIGH (ref 70–99)
Glucose-Capillary: 374 mg/dL — ABNORMAL HIGH (ref 70–99)
Glucose-Capillary: 123 mg/dL — ABNORMAL HIGH (ref 70–99)
Glucose-Capillary: 140 mg/dL — ABNORMAL HIGH (ref 70–99)

## 2012-10-27 LAB — BODY FLUID CELL COUNT WITH DIFFERENTIAL: Lymphs, Fluid: 11 %

## 2012-10-27 LAB — GRAM STAIN

## 2012-10-27 LAB — PROTIME-INR: INR: 1.46 (ref 0.00–1.49)

## 2012-10-27 LAB — AMMONIA: Ammonia: 90 umol/L — ABNORMAL HIGH (ref 11–60)

## 2012-10-27 MED ORDER — SODIUM CHLORIDE 0.9 % IV SOLN: 100.0000 mL | INTRAVENOUS | Status: DC | PRN

## 2012-10-27 MED ORDER — PNEUMOCOCCAL VAC POLYVALENT 25 MCG/0.5ML IJ INJ
0.5000 mL | INJECTION | INTRAMUSCULAR | Status: AC
  Administered 2012-10-28: 0.5 mL via INTRAMUSCULAR
  Filled 2012-10-27: qty 0.5

## 2012-10-27 MED ORDER — VANCOMYCIN HCL IN DEXTROSE 1-5 GM/200ML-% IV SOLN
1000.0000 mg | INTRAVENOUS | Status: DC | PRN
  Filled 2012-10-27: qty 200

## 2012-10-27 MED ORDER — SODIUM CHLORIDE 0.9 % IJ SOLN: 10.0000 mL | INTRAMUSCULAR | Status: DC | PRN

## 2012-10-27 MED ORDER — SODIUM CHLORIDE 0.9 % IV BOLUS (SEPSIS)
250.0000 mL | Freq: Once | INTRAVENOUS | Status: AC
  Administered 2012-10-27: 250 mL via INTRAVENOUS

## 2012-10-27 MED ORDER — ONDANSETRON HCL 4 MG/2ML IJ SOLN
4.0000 mg | INTRAMUSCULAR | Status: DC | PRN
  Administered 2012-10-27 – 2012-10-28 (×4): 4 mg via INTRAVENOUS
  Filled 2012-10-27 (×4): qty 2

## 2012-10-27 MED ORDER — SODIUM CHLORIDE 0.9 % IJ SOLN
10.0000 mL | Freq: Two times a day (BID) | INTRAMUSCULAR | Status: DC
  Administered 2012-10-27: 30 mL
  Administered 2012-10-27 – 2012-10-29 (×3): 10 mL
  Administered 2012-10-30: 40 mL
  Administered 2012-10-31: 20 mL
  Administered 2012-10-31: 10 mL

## 2012-10-27 MED ORDER — INSULIN ASPART 100 UNIT/ML ~~LOC~~ SOLN
0.0000 [IU] | SUBCUTANEOUS | Status: DC
  Administered 2012-10-27 (×2): 9 [IU] via SUBCUTANEOUS
  Administered 2012-10-27: 1 [IU] via SUBCUTANEOUS
  Administered 2012-10-27: 7 [IU] via SUBCUTANEOUS
  Administered 2012-10-27: 5 [IU] via SUBCUTANEOUS
  Administered 2012-10-27: 1 [IU] via SUBCUTANEOUS
  Administered 2012-10-28 (×2): 2 [IU] via SUBCUTANEOUS
  Administered 2012-10-28: 08:00:00 via SUBCUTANEOUS
  Administered 2012-10-28: 1 [IU] via SUBCUTANEOUS
  Administered 2012-10-28: 5 [IU] via SUBCUTANEOUS
  Administered 2012-10-28 – 2012-10-29 (×3): 2 [IU] via SUBCUTANEOUS
  Administered 2012-10-29: 1 [IU] via SUBCUTANEOUS
  Administered 2012-10-29 – 2012-10-30 (×3): 2 [IU] via SUBCUTANEOUS
  Administered 2012-10-31: 1 [IU] via SUBCUTANEOUS
  Administered 2012-10-31 (×2): 2 [IU] via SUBCUTANEOUS
  Administered 2012-10-31: 3 [IU] via SUBCUTANEOUS
  Administered 2012-11-01: 1 [IU] via SUBCUTANEOUS
  Administered 2012-11-01 (×3): 2 [IU] via SUBCUTANEOUS
  Administered 2012-11-01 – 2012-11-02 (×2): 3 [IU] via SUBCUTANEOUS
  Administered 2012-11-02: 2 [IU] via SUBCUTANEOUS
  Administered 2012-11-02: 3 [IU] via SUBCUTANEOUS

## 2012-10-27 MED ORDER — PENTAFLUOROPROP-TETRAFLUOROETH EX AERO
1.0000 "application " | INHALATION_SPRAY | CUTANEOUS | Status: DC | PRN
  Filled 2012-10-27: qty 103.5

## 2012-10-27 MED ORDER — ALTEPLASE 2 MG IJ SOLR
2.0000 mg | Freq: Once | INTRAMUSCULAR | Status: AC | PRN
  Filled 2012-10-27: qty 2

## 2012-10-27 MED ORDER — SODIUM CHLORIDE 0.9 % IJ SOLN
3.0000 mL | Freq: Two times a day (BID) | INTRAMUSCULAR | Status: DC
  Administered 2012-10-27 – 2012-10-31 (×6): 3 mL via INTRAVENOUS

## 2012-10-27 MED ORDER — LIDOCAINE-PRILOCAINE 2.5-2.5 % EX CREA
1.0000 "application " | TOPICAL_CREAM | CUTANEOUS | Status: DC | PRN
  Filled 2012-10-27 (×2): qty 5

## 2012-10-27 MED ORDER — VANCOMYCIN HCL IN DEXTROSE 1-5 GM/200ML-% IV SOLN
1000.0000 mg | Freq: Once | INTRAVENOUS | Status: AC
  Administered 2012-10-27: 1000 mg via INTRAVENOUS
  Filled 2012-10-27: qty 200

## 2012-10-27 MED ORDER — VANCOMYCIN HCL IN DEXTROSE 1-5 GM/200ML-% IV SOLN
1000.0000 mg | INTRAVENOUS | Status: DC
  Administered 2012-10-27 – 2012-10-30 (×2): 1000 mg via INTRAVENOUS
  Filled 2012-10-27 (×3): qty 200

## 2012-10-27 MED ORDER — HEPARIN SODIUM (PORCINE) 1000 UNIT/ML DIALYSIS
1000.0000 [IU] | INTRAMUSCULAR | Status: DC | PRN
  Filled 2012-10-27: qty 1

## 2012-10-27 MED ORDER — SODIUM CHLORIDE 0.9 % IV SOLN
INTRAVENOUS | Status: DC
  Administered 2012-10-27 (×2): via INTRAVENOUS

## 2012-10-27 MED ORDER — IOHEXOL 300 MG/ML  SOLN
50.0000 mL | Freq: Once | INTRAMUSCULAR | Status: AC | PRN
  Administered 2012-10-27: 50 mL via ORAL

## 2012-10-27 MED ORDER — FENTANYL CITRATE 0.05 MG/ML IJ SOLN
12.5000 ug | Freq: Once | INTRAMUSCULAR | Status: AC
  Administered 2012-10-27: 12.5 ug via INTRAVENOUS
  Filled 2012-10-27: qty 2

## 2012-10-27 MED ORDER — NEPRO/CARBSTEADY PO LIQD: 237.0000 mL | ORAL | Status: DC | PRN

## 2012-10-27 MED ORDER — PIPERACILLIN-TAZOBACTAM IN DEX 2-0.25 GM/50ML IV SOLN
2.2500 g | Freq: Three times a day (TID) | INTRAVENOUS | Status: DC
  Administered 2012-10-27 – 2012-11-01 (×15): 2.25 g via INTRAVENOUS
  Filled 2012-10-27 (×17): qty 50

## 2012-10-27 MED ORDER — IOHEXOL 300 MG/ML  SOLN
100.0000 mL | Freq: Once | INTRAMUSCULAR | Status: AC | PRN
  Administered 2012-10-27: 100 mL via INTRAVENOUS

## 2012-10-27 MED ORDER — LIDOCAINE HCL (PF) 1 % IJ SOLN: 5.0000 mL | INTRAMUSCULAR | Status: DC | PRN

## 2012-10-27 MED ORDER — FENTANYL CITRATE 0.05 MG/ML IJ SOLN
50.0000 ug | INTRAMUSCULAR | Status: DC | PRN
  Administered 2012-10-28 – 2012-10-29 (×6): 50 ug via INTRAVENOUS
  Filled 2012-10-27 (×7): qty 2

## 2012-10-27 MED ORDER — VANCOMYCIN HCL IN DEXTROSE 1-5 GM/200ML-% IV SOLN
INTRAVENOUS | Status: AC
  Filled 2012-10-27: qty 200

## 2012-10-27 MED ORDER — SODIUM CHLORIDE 0.9 % IV BOLUS (SEPSIS)
500.0000 mL | Freq: Once | INTRAVENOUS | Status: AC
  Administered 2012-10-27: 500 mL via INTRAVENOUS

## 2012-10-27 NOTE — Progress Notes (Addendum)
Subjective: This man was admitted with abdominal pain and sepsis picture. He has end-stage renal disease and is supposed to be doing peritoneal dialysis but compliance is poor. 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 He has not been deemed to be a candidate for liver or renal transplantation. He was hospitalized just a few days ago and left AMA a couple of days ago, despite multiple attempts to convince him to stay in the hospital. This is a pattern that he seems to follow.           Physical Exam: Blood pressure 121/60, pulse 115, temperature 98.5 F (36.9 C), temperature source Axillary, resp. rate 14, height 6\' 3"  (1.905 m), weight 104.3 kg (229 lb 15 oz), SpO2 98.00%. He looks sick. He has a resting tachycardia. His peripheries are warm. Abdomen is soft but clearly tender more on the left side than the right. Bowel sounds are heard. He is very drowsy. Heart sounds are present and in sinus rhythm. He has gynecomastia. There are no obvious focal neurological signs. He does not have peripheral pitting edema in his legs.   Investigations:  Recent Results (from the past 240 hour(s))  MRSA PCR SCREENING     Status: None   Collection Time    10/26/12 10:10 PM      Result Value Range Status   MRSA by PCR NEGATIVE  NEGATIVE Final   Comment:            The GeneXpert MRSA Assay (FDA     approved for NASAL specimens     only), is one component of a     comprehensive MRSA colonization     surveillance program. It is not     intended to diagnose MRSA     infection nor to guide or     monitor treatment for     MRSA infections.  CULTURE, BLOOD (ROUTINE X 2)     Status: None   Collection Time    10/26/12 10:39 PM      Result Value Range Status   Specimen Description BLOOD RIGHT ARM   Final   Special Requests     Final   Value: BOTTLES DRAWN AEROBIC AND ANAEROBIC AEB 6CC ANA 4CC   Culture PENDING   Incomplete   Report Status PENDING   Incomplete   CULTURE, BLOOD (ROUTINE X 2)     Status: None   Collection Time    10/26/12 10:44 PM      Result Value Range Status   Specimen Description BLOOD RIGHT ARM   Final   Special Requests     Final   Value: BOTTLES DRAWN AEROBIC AND ANAEROBIC AEB 6CC ANA 5CC   Culture PENDING   Incomplete   Report Status PENDING   Incomplete     Basic Metabolic Panel:  Recent Labs  27/25/36 1634 10/27/12 0446  NA 137 135  K 4.1 4.7  CL 94* 96  CO2 29 24  GLUCOSE 390* 402*  BUN 63* 72*  CREATININE 7.55* 7.96*  CALCIUM 9.2 8.2*   Liver Function Tests:  Recent Labs  10/26/12 1634 10/27/12 0446  AST 28 27  ALT 35 25  ALKPHOS 164* 114  BILITOT 1.3* 0.6  PROT 7.0 5.2*  ALBUMIN 2.4* 1.6*     CBC:  Recent Labs  10/26/12 1634 10/27/12 0446  WBC 17.3* 27.7*  NEUTROABS 15.5*  --   HGB 11.4* 9.3*  HCT 33.8* 27.3*  MCV  93.1 93.8  PLT 149* 117*    Ct Abdomen Pelvis W Contrast  10/27/2012  *RADIOLOGY REPORT*  Clinical Data: Acute abdominal pain, vomiting and jaundice.  CT ABDOMEN AND PELVIS WITH CONTRAST  Technique:  Multidetector CT imaging of the abdomen and pelvis was performed following the standard protocol during bolus administration of intravenous contrast.  Contrast: 100 mL of Omnipaque 300 IV contrast  Comparison: CT of the abdomen and pelvis performed 10/16/2012, MRI of the lumbar spine performed 03/07/2012, and abdominal ultrasound performed 08/10/2012  Findings: There is a persistent somewhat loculated small right pleural effusion, with associated pleural thickening.  Round airspace opacity at the right lower lobe again likely reflects round atelectasis.  Both of these findings are generally stable from 2012.  Scattered coronary artery calcifications are seen.  A small to moderate amount of free fluid is noted within the lower abdomen and pelvis, tracking predominately along the left paracolic gutter and about small bowel loops.  This is slightly decreased from the prior study, and  reflects the patient's peritoneal dialysis.  The degree of soft tissue edema within the small bowel mesentery is relatively stable, and may reflect reactive change secondary to intra-abdominal fluid.  A vague 2.6 cm partially hypodense heterogeneous lesion within the right hepatic lobe is nonspecific.  The liver is decreased in size, with mild nodularity, compatible with cirrhosis. The patient's TIPS is grossly unchanged in appearance; its patency is not well assessed on this study.  The spleen is unremarkable.  The pancreas is somewhat atrophic; dense calcification at the pancreatic head likely reflects sequelae of chronic pancreatitis.  Scattered peripancreatic nodes are grossly unchanged.  The adrenal glands are within normal limits.  Chronic bilateral renal atrophy is noted.  Nonspecific perinephric stranding is noted bilaterally.  Scattered tiny renal cysts are again noted.  There is no evidence of hydronephrosis.  No renal or ureteral stones are seen.  There is suggestion of mild irregular wall thickening along the jejunum, which could reflect a mild infectious process, or could be reactive in nature.  The patient's peritoneal dialysis catheter is noted ending along the anterior left hemipelvis, above the bladder. The stomach is within normal limits.  No acute vascular abnormalities are seen.  Diffuse calcification is noted along the abdominal aorta and its branches, including at the origins of both renal arteries.  The appendix is normal in caliber and contains air.  Contrast progresses to the level of the ascending colon.  Previously noted soft tissue inflammation at the proximal sigmoid colon appears have resolved.  The colon is grossly unremarkable in appearance.  The bladder is mildly distended and grossly unremarkable.  The prostate remains normal in size.  No inguinal lymphadenopathy is seen.  No acute osseous abnormalities are identified.  There is partial chronic osseous fusion at L1-L2.  IMPRESSION:   1.  Previously noted changes suggestive of focal colitis have resolved. 2.  Apparent irregular wall thickening along much of the jejunum, possibly reflecting an acute infectious process. 3.  Small to moderate volume ascites likely reflects peritoneal dialysis, slightly decreased in volume from the prior study. Diffuse mesenteric edema may be reactive secondary to surrounding fluid. 4.  Small right-sided pleural effusion, with loculation and pleural thickening.  Rounded right lower lobe airspace disease appears to reflect rounded atelectasis.  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 6.  Patency of the TIPS is not well assessed on this study; cirrhotic change again noted.  2.6 cm heterogeneous lesion within the right hepatic lobe is somewhat better characterized than on prior studies.  Though this could simply be postoperative, given adjacent underlying calcification, dynamic liver protocol MRI or CT could be considered for further evaluation.  MRI should only be performed if the patient can hold his breath for the study. 7.  Chronic bilateral renal atrophy noted, with scattered tiny bilateral renal cysts. 8.  Diffuse calcification along the abdominal aorta and its branches, including at the origins of both renal arteries.   Original Report Authenticated By: Tonia Ghent, M.D.    Dg Chest Portable 1 View  10/26/2012  *RADIOLOGY REPORT*  Clinical Data: Abdominal pain  PORTABLE CHEST - 1 VIEW  Comparison: 4/11/ 14  Findings: Cardiomegaly again noted.  No acute infiltrate or pulmonary edema.  Stable right basilar scarring.  Mild hyperinflation.  IMPRESSION: .  Cardiomegaly.  Stable right basilar scarring.  No acute infiltrate or pulmonary edema.   Original Report Authenticated By: Natasha Mead, M.D.       Medications: I have reviewed the patient's current medications.  Impression: 1. Sepsis, unclear etiology. Possibly related to bacterial  peritonitis. 2. 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 Not a candidate for liver transplantation. 3. End stage renal disease on peritoneal dialysis but compliance is a big issue. Not a candidate for renal transplantation. 4. Altered mental status/drowsiness secondary to #1, #2 and #3. 5. Anemia, hemoglobin drop of 2 points since a few days ago. Concern regarding further GI bleeding. 6. Poor IV access.    Plan: 1. Central line, will consult surgery. 2. Continue with intravenous antibiotics (changed to Zosyn and vancomycin) and supportive care with IV fluids. 3. Agree with nephrology and gastroenterology consultation. 4. His prognosis is rather poor here. Have had a long discussion with the patient's son, who lives in [place], who is the health care power of attorney and recommended DO NOT RESUSCITATE and consideration of comfort measures and hospice care. He will discuss with his siblings.     LOS: 1 day   Wilson Singer Pager [phone]  10/27/2012, 7:48 AM

## 2012-10-27 NOTE — Care Management Note (Signed)
    Page 1 of 1   11/02/2012     11:31:08 AM   CARE MANAGEMENT NOTE 11/02/2012  Patient:  Timothy Chan, Timothy Chan   Account Number:  [id number]  Date Initiated:  10/27/2012  Documentation initiated by:  Sharrie Rothman  Subjective/Objective Assessment:   Pt admitted from home with abd pain and sepsis. Pt lives with his son and will return home at discharge. Pt is active with [facility] rn and has a cane for home use.     Action/Plan:   CM will continue to monitor for discharge needs.   Anticipated DC Date:  11/03/2012   Anticipated DC Plan:  HOME W HOME HEALTH SERVICES      DC Planning Services  CM consult      Choice offered to / List presented to:             Status of service:  Completed, signed off Medicare Important Message given?  YES (If response is "NO", the following Medicare IM given date fields will be blank) Date Medicare IM given:  11/02/2012 Date Additional Medicare IM given:    Discharge Disposition:  SKILLED NURSING FACILITY  Per UR Regulation:    If discussed at Long Length of Stay Meetings, dates discussed:   10/31/2012    Comments:  11/02/12 1130 Arlyss Queen, RN BSN CM Pt discharged to [facility] today. CSW to arrange discharge to facility.  10/27/12 1530 Arlyss Queen, RN BSN CM

## 2012-10-27 NOTE — Procedures (Signed)
Central Venous Catheter Insertion Procedure Note Timothy Chan [id number] 04-04-1951  Procedure: Insertion of Central Venous Catheter Indications: Drug and/or fluid administration and Frequent blood sampling  Procedure Details Consent: Risks of procedure as well as the alternatives and risks of each were explained to the (patient/caregiver).  Consent for procedure obtained. Time Out: Verified patient identification, verified procedure, site/side was marked, verified correct patient position, special equipment/implants available, medications/allergies/relevent history reviewed, required imaging and test results available.  Performed  Maximum sterile technique was used including antiseptics, gloves, gown, hand hygiene, mask and sheet. Skin prep: Chlorhexidine; local anesthetic administered A antimicrobial bonded/coated triple lumen catheter was placed in the right femoral vein due to patient being a dialysis patient using the Seldinger technique.  Evaluation Blood flow good Complications: No apparent complications Patient did tolerate procedure well.   Timothy Chan A 10/27/2012, 9:17 AM

## 2012-10-27 NOTE — Progress Notes (Signed)
ANTIBIOTIC CONSULT NOTE - INITIAL  Pharmacy Consult for intermittent vancomycin Indication: coverage for possible empyema in renal failure patient s/p stent placement  Allergies  Allergen Reactions  . Tylenol (Acetaminophen) Other (See Comments)    cirrhosis  . Morphine And Related Itching    Patient Measurements: Height: 6\' 3"  (190.5 cm) Weight: 229 lb 15 oz (104.3 kg) IBW/kg (Calculated) : 84.5 Adjusted Body Weight: 91 kg  Vital Signs: Temp: 98 F (36.7 C) (04/17 2220) Temp src: Oral (04/17 2220) BP: 120/62 mmHg (04/18 0200) Pulse Rate: 119 (04/18 0100) Intake/Output from previous day: 04/17 0701 - 04/18 0700 In: 300 [IV Piggyback:300] Out: -  Intake/Output from this shift: Total I/O In: 300 [IV Piggyback:300] Out: -   Labs:  Recent Labs  10/24/12 0525 10/26/12 1634  WBC 6.3 17.3*  HGB 7.9* 11.4*  PLT 98* 149*  CREATININE 5.40* 7.55*   Estimated Creatinine Clearance: 13.4 ml/min (by C-G formula based on Cr of 7.55). No results found for this basename: VANCOTROUGH, Leodis Binet, VANCORANDOM, GENTTROUGH, GENTPEAK, GENTRANDOM, TOBRATROUGH, TOBRAPEAK, TOBRARND, AMIKACINPEAK, AMIKACINTROU, AMIKACIN,  in the last 72 hours   Microbiology: Recent Results (from the past 720 hour(s))  MRSA PCR SCREENING     Status: None   Collection Time    10/26/12 10:10 PM      Result Value Range Status   MRSA by PCR NEGATIVE  NEGATIVE Final   Comment:            The GeneXpert MRSA Assay (FDA     approved for NASAL specimens     only), is one component of a     comprehensive MRSA colonization     surveillance program. It is not     intended to diagnose MRSA     infection nor to guide or     monitor treatment for     MRSA infections.  CULTURE, BLOOD (ROUTINE X 2)     Status: None   Collection Time    10/26/12 10:39 PM      Result Value Range Status   Specimen Description BLOOD RIGHT ARM   Final   Special Requests     Final   Value: BOTTLES DRAWN AEROBIC AND ANAEROBIC AEB  6CC ANA 4CC   Culture PENDING   Incomplete   Report Status PENDING   Incomplete  CULTURE, BLOOD (ROUTINE X 2)     Status: None   Collection Time    10/26/12 10:44 PM      Result Value Range Status   Specimen Description BLOOD RIGHT ARM   Final   Special Requests     Final   Value: BOTTLES DRAWN AEROBIC AND ANAEROBIC AEB 6CC ANA 5CC   Culture PENDING   Incomplete   Report Status PENDING   Incomplete    Medical History: Past Medical History  Diagnosis Date  . Rectal bleeding   . Dialysis care   . GI bleed   . Hypertension   . Esophageal varices   . Alcoholic liver disease   . Hepatic encephalopathy   . Ascites   . Diabetes mellitus   . Detached retina   . Renal disorder     Medications:  Scheduled:  . cefTRIAXone (ROCEPHIN)  IV  1 g Intravenous Q24H  . fentaNYL  12.5 mcg Intravenous Once  . insulin aspart  0-9 Units Subcutaneous Q4H  . metoprolol succinate  25 mg Oral Daily  . [COMPLETED] morphine  2 mg Intravenous Once  . [COMPLETED] ondansetron (ZOFRAN) IV  4 mg Intravenous Once  . [COMPLETED] ondansetron  4 mg Intravenous Once  . [COMPLETED] pantoprazole (PROTONIX) IV  40 mg Intravenous Once  . pantoprazole (PROTONIX) IV  40 mg Intravenous Q12H  . rifaximin  275 mg Oral BID  . [COMPLETED] sodium chloride  250 mL Intravenous Once  . [COMPLETED] sodium chloride  500 mL Intravenous Once  . sodium chloride  3 mL Intravenous Q12H  . vancomycin  1,000 mg Intravenous Once   Infusions:   PRN: [COMPLETED] iohexol, [EXPIRED] iohexol, [COMPLETED] iohexol, lactulose, ondansetron (ZOFRAN) IV, traMADol  Assessment: Coverage for possible empyema s/p stent placement on (?) 4/15th.  Patient already on ceftriaxone and rifaximin  Goal of Therapy:  Initiation of vancomycin therapy pending culture results and renal function tests, with expected hemodialysis to be ordered  Plan:  1.  Vancomycin 1gm IV x 1 dose 2.  Further doses to be ordered after review by clinical pharmacist in  morning  Scarlett Presto 10/27/2012,3:15 AM

## 2012-10-27 NOTE — Progress Notes (Signed)
UR Chart Review Completed  

## 2012-10-27 NOTE — Procedures (Signed)
   HEMODIALYSIS TREATMENT NOTE:  4 hour HD session completed through left forearm AVF.  Arterial end had to be recannulated antegrade due to excessively negative pressure retrograde.  BP unable to tolerate removal of 2L as ordered.  1 L removed with 1.5 hours of interrupted ultrafiltration time.  All blood was reinfused.  Hemostasis was achieved within 14 minutes.  Report given to Billy Coast, RN

## 2012-10-27 NOTE — Consult Note (Signed)
Reason for consultation; Abdominal pain and encephalopathy in a patient with known end-stage liver disease. History of present illness; Patient is 62 year old Caucasian male who is well known to me from previous evaluations. Most recent was admitted to [facility] on 10/20/2012 4 hepatic encephalopathy and was also felt to have SBP. His stools were black and heme-positive. He has history of gastric antral vascular ectasia. 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 APC therapy was performed. He did receive 2 units of PRBCs during his last hospitalization. IV antibiotic was recommended for 2 more days the patient decided to leave on the evening of 10/24/2012 to return to the emergency room last evening with a generalized abdominal pain and hepatic encephalopathy. He was admitted to ICU. He was begun on IV antibiotics. He has been evaluated by Dr. Fausto Skillern and is being dialyzed. He he states he vomited clear fluid yesterday but denies hematemesis melena or rectal bleeding. He did go back on peritoneal dialysis after he went home 3 days ago. He denies fever or chills. He states he feels bad. Had abdominopelvic CT on admission revealing small to moderate amount of ascites, diffuse mesenteric edema small right pleural effusion, patent TIPS, atrophic kidneys and wall thickening to jejunum.  Current medications; Current Facility-Administered Medications  Medication Dose Route Frequency Provider Last Rate Last Dose  . 0.9 %  sodium chloride infusion   Intravenous Continuous Nimish C Gosrani, MD 100 mL/hr at 10/27/12 1400    . 0.9 %  sodium chloride infusion  100 mL Intravenous PRN Jamse Mead, MD      . 0.9 %  sodium chloride infusion  100 mL Intravenous PRN Jamse Mead, MD      . alteplase (CATHFLO ACTIVASE) injection 2 mg  2 mg Intracatheter Once PRN Jamse Mead, MD      . feeding supplement (NEPRO CARB  STEADY) liquid 237 mL  237 mL Oral PRN Jamse Mead, MD      . fentaNYL (SUBLIMAZE) injection 50 mcg  50 mcg Intravenous Q4H PRN Nimish C Gosrani, MD      . heparin injection 1,000 Units  1,000 Units Dialysis PRN Jamse Mead, MD      . insulin aspart (novoLOG) injection 0-9 Units  0-9 Units Subcutaneous Q4H Vania Rea, MD   5 Units at 10/27/12 1228  . lactulose (CHRONULAC) 10 GM/15ML solution 20 g  20 g Oral QID PRN Vania Rea, MD   20 g at 10/27/12 0956  . lidocaine (PF) (XYLOCAINE) 1 % injection 5 mL  5 mL Intradermal PRN Jamse Mead, MD      . lidocaine-prilocaine (EMLA) cream 1 application  1 application Topical PRN Jamse Mead, MD      . metoprolol succinate (TOPROL-XL) 24 hr tablet 25 mg  25 mg Oral Daily Vania Rea, MD   25 mg at 10/27/12 0956  . ondansetron (ZOFRAN) injection 4 mg  4 mg Intravenous Q4H PRN Vania Rea, MD   4 mg at 10/27/12 0747  . pantoprazole (PROTONIX) injection 40 mg  40 mg Intravenous Q12H Vania Rea, MD   40 mg at 10/27/12 0956  . pentafluoroprop-tetrafluoroeth (GEBAUERS) aerosol 1 application  1 application Topical PRN Jamse Mead, MD      . piperacillin-tazobactam (ZOSYN) IVPB 2.25 g  2.25 g Intravenous Q8H Nimish C Gosrani, MD   2.25 g at 10/27/12 0957  . [START ON 10/28/2012] pneumococcal 23 valent  vaccine (PNU-IMMUNE) injection 0.5 mL  0.5 mL Intramuscular Tomorrow-1000 Vania Rea, MD      . rifaximin Burman Blacksmith) tablet 275 mg  275 mg Oral BID Vania Rea, MD   275 mg at 10/27/12 0956  . sodium chloride 0.9 % injection 10-40 mL  10-40 mL Intracatheter Q12H Dalia Heading, MD   10 mL at 10/27/12 0957  . sodium chloride 0.9 % injection 10-40 mL  10-40 mL Intracatheter PRN Dalia Heading, MD      . sodium chloride 0.9 % injection 3 mL  3 mL Intravenous Q12H Vania Rea, MD   3 mL at 10/27/12 0958  . traMADol (ULTRAM) tablet 50 mg  50 mg Oral Q6H PRN Vania Rea, MD   50 mg at  10/27/12 0747  . vancomycin (VANCOCIN) IVPB 1000 mg/200 mL premix  1,000 mg Intravenous Q M,W,F-HD Wilson Singer, MD       Past medical history; Past Medical History  Diagnosis Date  . Rectal bleeding   . Dialysis care   . GI bleed   . Hypertension   . Esophageal varices   . Alcoholic liver disease   . Hepatic encephalopathy   . Ascites   . Diabetes mellitus   . Detached retina   . 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 He has not drank any alcohol in over 10 years. He was initially evaluated for [facility] at [facility] in [facility] and now he goes to [facility] under the care of Dr. Pollyann Samples. He is in need of hepatorenal transplantation but felt not to be a candidate. He had TIPS placed at [facility] few years ago. He has history of recurrent upper GI bleed secondary to GAVE. He did well following APC therapy and 2012 out need for blood transfusion until his last admission. His disease is also complicated by hepatic encephalopathy and management has been difficult because he is not able to afford Xifaxan.   Physical exam; BP 118/47  Pulse 114  Temp(Src) 98.8 F (37.1 C) (Oral)  Resp 14  Ht 6\' 3"  (1.905 m)  Wt 229 lb 15 oz (104.3 kg)  BMI 28.74 kg/m2  SpO2 100% Patient is drowsy but responds appropriately to questions. Asterixis present. Conjunctiva is pale; sclera is nonicteric. No neck masses or thyromegaly noted. Heart exam with regular rhythm normal S1 and S2. No murmur or gallop noted. Abdomen is protuberant. PD cannula is in place. Bowel sounds are normal. Abdomen is soft with generalized tenderness with mild guarding. No organomegaly or masses noted. No LE edema noted. Lab data reviewed. WBC 17.3 H&H 11.4 and 33.8 platelet count 149K Serum ammonia is 119. Stool Hemoccult is positive. Lactic acid is  2.9(0.5-2.2). Bilirubin 1.3, AP 164, AST 28, ALT 35 and albumin 2.4 H&H from this morning it 9.3 and 27.3 and WBC is 27.7 Assessment; I  agree patient has developed signs and symptoms of peritonitis. This would not be considered spontaneous bacterial peritonitis as he has PD cannula in place. If he does not respond to antibiotic therapy within 24-48 hours we may have to treat him for Candida as well until culture results known. Recurrent hepatic encephalopathy secondary to acute illness and marginal hepatic function. Anemia appears to be multifactorial. Heme positive stool may be result of recent APC therapy. Recommendations; PD cannula should be removed and fluid cultured for bacteria and fungi. Continue lactulose and Xifaxan. Will monitor H&H.

## 2012-10-27 NOTE — Progress Notes (Signed)
Inpatient Diabetes Program Recommendations  AACE/ADA: New Consensus Statement on Inpatient Glycemic Control (2013)  Target Ranges:  Prepandial:   less than 140 mg/dL      Peak postprandial:   less than 180 mg/dL (1-2 hours)      Critically ill patients:  140 - 180 mg/dL    Results for Timothy Chan, Timothy Chan (MRN [id number]) as of 10/27/2012 08:52  Ref. Range 10/27/2012 01:06 10/27/2012 04:08 10/27/2012 07:39  Glucose-Capillary Latest Range: 70-99 mg/dL 409 (H) 811 (H) 914 (H)    Inpatient Diabetes Program Recommendations Insulin - Basal: Please consider ordering low dose basal insulin. Recommend Levemir 10 units daily.  Note: Blood glucose since admission has been consistently greater than 300 mg/dl.  Please consider ordering low dose basal insulin; recommend Levemir 10 units daily.  Will continue to follow.  Thanks, Orlando Penner, RN, BSN, CCRN Diabetes Coordinator Inpatient Diabetes Program [phone]

## 2012-10-27 NOTE — Plan of Care (Signed)
Problem: Consults Goal: General Medical Patient Education See Patient Education Module for specific education.  Outcome: Progressing Patient admitted with abdominal pain, resulting into peritonitis.  Patient educated on infective process. Goal: Skin Care Protocol Initiated - if Braden Score 18 or less If consults are not indicated, leave blank or document N/A  Outcome: Progressing Turning Q2hrs, patient jaundiced and abdomen tender to touch, Goal: Nutrition Consult-if indicated Outcome: Progressing Patient remains NPO at this point  Problem: Phase I Progression Outcomes Goal: Pain controlled with appropriate interventions Outcome: Completed/Met Date Met:  10/27/12 Pain management is working.  At present patient denies need for pain management, repositioning only Goal: OOB as tolerated unless otherwise ordered Outcome: Not Progressing Remains on bedrest.  Weakness and using sky lift for turning Goal: Initial discharge plan identified Outcome: Progressing Patient and family discussions going on about need for placement after admission.  Patient presently lives alone and has a case worker that comes to the house. Goal: Voiding-avoid urinary catheter unless indicated Outcome: Not Progressing Patient remains anuric at present

## 2012-10-27 NOTE — Consult Note (Signed)
Reason for Consult:  end-stage renal disease  Referring Physician: Dr. Michaelle Copas Timothy Chan is an 62 y.o. male.  HPI: He is  patient who has history of liver cirrhosis, recurrent hepatic encephalopathy, end-stage renal disease on maintenance hemodialysis and non-compliant with medication and treatment. Her presently had came from home because of increased somnolence and recurrent abdominal pain and nausea. Presently patient was elevated BUN and creatinine. Patient states that he has been his PD but at this moment her blood sure whether he has been in office exchange. Also the time even when he is on dialysis patient This time and at times doesn't come because of different reasons. Recently started on peritoneal dialysis even though advised him against   Past Medical History  Diagnosis Date  . Rectal bleeding   . Dialysis care   . GI bleed   . Hypertension   . Esophageal varices   . Alcoholic liver disease   . Hepatic encephalopathy   . Ascites   . Diabetes mellitus   . Detached retina   . Renal disorder     Past Surgical History  Procedure Laterality Date  . Esophagogastroduodenoscopy  12/31/2010    EGD [facility] THERAPY  . Esophagogastroduodenoscopy  05/31/2012E    GD [facility] ABLATION  . Small bowel givens  12/01/2010  . Colonoscopy  09/07/2010  . Esophagogastroduodenoscopy  09/05/2010    OUTLAW  . Lung surgery  6/11    [place]  . Esophagogastroduodenoscopy  03/26/2011    Procedure: ESOPHAGOGASTRODUODENOSCOPY (EGD);  Surgeon: Malissa Hippo, MD;  Location: [facility];  Service: Endoscopy;  Laterality: N/A;  8:30 am  . Hot hemostasis  03/26/2011    Procedure: HOT HEMOSTASIS (ARGON PLASMA COAGULATION/BICAP);  Surgeon: Malissa Hippo, MD;  Location: [facility];  Service: Endoscopy;  Laterality: N/A;  . Stent in liver    . Esophagogastroduodenoscopy N/A 10/24/2012    Procedure: ESOPHAGOGASTRODUODENOSCOPY (EGD);  Surgeon: Malissa Hippo, MD;  Location: [facility];  Service:  Endoscopy;  Laterality: N/A;    History reviewed. No pertinent family history.  Social History:  reports that he quit smoking about 14 months ago. His smoking use included Cigarettes. He smoked 0.50 packs per day. He quit smokeless tobacco use about 5 months ago. He reports that he does not drink alcohol or use illicit drugs.  Allergies:  Allergies  Allergen Reactions  . Tylenol (Acetaminophen) Other (See Comments)    cirrhosis  . Morphine And Related Itching    Medications: I have reviewed the patient's current medications.  Results for orders placed during the hospital encounter of 10/26/12 (from the past 48 hour(s))  CBC WITH DIFFERENTIAL     Status: Abnormal   Collection Time    10/26/12  4:34 PM      Result Value Range   WBC 17.3 (*) 4.0 - 10.5 K/uL   RBC 3.63 (*) 4.22 - 5.81 MIL/uL   Hemoglobin 11.4 (*) 13.0 - 17.0 g/dL   HCT 16.1 (*) 09.6 - 04.5 %   MCV 93.1  78.0 - 100.0 fL   MCH 31.4  26.0 - 34.0 pg   MCHC 33.7  30.0 - 36.0 g/dL   RDW 40.9 (*) 81.1 - 91.4 %   Platelets 149 (*) 150 - 400 K/uL   Neutrophils Relative 89 (*) 43 - 77 %   Neutro Abs 15.5 (*) 1.7 - 7.7 K/uL   Lymphocytes Relative 6 (*) 12 - 46 %   Lymphs Abs 1.0  0.7 -  4.0 K/uL   Monocytes Relative 4  3 - 12 %   Monocytes Absolute 0.8  0.1 - 1.0 K/uL   Eosinophils Relative 0  0 - 5 %   Eosinophils Absolute 0.0  0.0 - 0.7 K/uL   Basophils Relative 0  0 - 1 %   Basophils Absolute 0.0  0.0 - 0.1 K/uL  COMPREHENSIVE METABOLIC PANEL     Status: Abnormal   Collection Time    10/26/12  4:34 PM      Result Value Range   Sodium 137  135 - 145 mEq/L   Potassium 4.1  3.5 - 5.1 mEq/L   Chloride 94 (*) 96 - 112 mEq/L   CO2 29  19 - 32 mEq/L   Glucose, Bld 390 (*) 70 - 99 mg/dL   BUN 63 (*) 6 - 23 mg/dL   Creatinine, Ser 1.61 (*) 0.50 - 1.35 mg/dL   Calcium 9.2  8.4 - 09.6 mg/dL   Total Protein 7.0  6.0 - 8.3 g/dL   Albumin 2.4 (*) 3.5 - 5.2 g/dL   AST 28  0 - 37 U/L   ALT 35  0 - 53 U/L   Alkaline  Phosphatase 164 (*) 39 - 117 U/L   Total Bilirubin 1.3 (*) 0.3 - 1.2 mg/dL   GFR calc non Af Amer 7 (*) >90 mL/min   GFR calc Af Amer 8 (*) >90 mL/min   Comment:            The eGFR has been calculated     using the CKD EPI equation.     This calculation has not been     validated in all clinical     situations.     eGFR's persistently     <90 mL/min signify     possible Chronic Kidney Disease.  TROPONIN I     Status: None   Collection Time    10/26/12  4:34 PM      Result Value Range   Troponin I <0.30  <0.30 ng/mL   Comment:            Due to the release kinetics of cTnI,     a negative result within the first hours     of the onset of symptoms does not rule out     myocardial infarction with certainty.     If myocardial infarction is still suspected,     repeat the test at appropriate intervals.  AMMONIA     Status: Abnormal   Collection Time    10/26/12  6:25 PM      Result Value Range   Ammonia 119 (*) 11 - 60 umol/L  MRSA PCR SCREENING     Status: None   Collection Time    10/26/12 10:10 PM      Result Value Range   MRSA by PCR NEGATIVE  NEGATIVE   Comment:            The GeneXpert MRSA Assay (FDA     approved for NASAL specimens     only), is one component of a     comprehensive MRSA colonization     surveillance program. It is not     intended to diagnose MRSA     infection nor to guide or     monitor treatment for     MRSA infections.  CULTURE, BLOOD (ROUTINE X 2)     Status: None   Collection Time    10/26/12 10:39  PM      Result Value Range   Specimen Description BLOOD RIGHT ARM     Special Requests       Value: BOTTLES DRAWN AEROBIC AND ANAEROBIC AEB 6CC ANA 4CC   Culture PENDING     Report Status PENDING    LACTIC ACID, PLASMA     Status: Abnormal   Collection Time    10/26/12 10:40 PM      Result Value Range   Lactic Acid, Venous 2.9 (*) 0.5 - 2.2 mmol/L  CULTURE, BLOOD (ROUTINE X 2)     Status: None   Collection Time    10/26/12 10:44 PM       Result Value Range   Specimen Description BLOOD RIGHT ARM     Special Requests       Value: BOTTLES DRAWN AEROBIC AND ANAEROBIC AEB 6CC ANA 5CC   Culture PENDING     Report Status PENDING    GLUCOSE, CAPILLARY     Status: Abnormal   Collection Time    10/27/12  1:06 AM      Result Value Range   Glucose-Capillary 390 (*) 70 - 99 mg/dL   Comment 1 Documented in Chart     Comment 2 Notify RN    GLUCOSE, CAPILLARY     Status: Abnormal   Collection Time    10/27/12  4:08 AM      Result Value Range   Glucose-Capillary 374 (*) 70 - 99 mg/dL   Comment 1 Documented in Chart     Comment 2 Notify RN    CBC     Status: Abnormal   Collection Time    10/27/12  4:46 AM      Result Value Range   WBC 27.7 (*) 4.0 - 10.5 K/uL   RBC 2.91 (*) 4.22 - 5.81 MIL/uL   Hemoglobin 9.3 (*) 13.0 - 17.0 g/dL   Comment: DELTA CHECK NOTED     RESULT REPEATED AND VERIFIED   HCT 27.3 (*) 39.0 - 52.0 %   MCV 93.8  78.0 - 100.0 fL   MCH 32.0  26.0 - 34.0 pg   MCHC 34.1  30.0 - 36.0 g/dL   RDW 14.7 (*) 82.9 - 56.2 %   Platelets 117 (*) 150 - 400 K/uL  COMPREHENSIVE METABOLIC PANEL     Status: Abnormal   Collection Time    10/27/12  4:46 AM      Result Value Range   Sodium 135  135 - 145 mEq/L   Potassium 4.7  3.5 - 5.1 mEq/L   Chloride 96  96 - 112 mEq/L   CO2 24  19 - 32 mEq/L   Glucose, Bld 402 (*) 70 - 99 mg/dL   BUN 72 (*) 6 - 23 mg/dL   Creatinine, Ser 1.30 (*) 0.50 - 1.35 mg/dL   Calcium 8.2 (*) 8.4 - 10.5 mg/dL   Total Protein 5.2 (*) 6.0 - 8.3 g/dL   Albumin 1.6 (*) 3.5 - 5.2 g/dL   AST 27  0 - 37 U/L   ALT 25  0 - 53 U/L   Alkaline Phosphatase 114  39 - 117 U/L   Total Bilirubin 0.6  0.3 - 1.2 mg/dL   GFR calc non Af Amer 6 (*) >90 mL/min   GFR calc Af Amer 7 (*) >90 mL/min   Comment:            The eGFR has been calculated     using the CKD  EPI equation.     This calculation has not been     validated in all clinical     situations.     eGFR's persistently     <90 mL/min signify      possible Chronic Kidney Disease.    Ct Abdomen Pelvis W Contrast  10/27/2012  *RADIOLOGY REPORT*  Clinical Data: Acute abdominal pain, vomiting and jaundice.  CT ABDOMEN AND PELVIS WITH CONTRAST  Technique:  Multidetector CT imaging of the abdomen and pelvis was performed following the standard protocol during bolus administration of intravenous contrast.  Contrast: 100 mL of Omnipaque 300 IV contrast  Comparison: CT of the abdomen and pelvis performed 10/16/2012, MRI of the lumbar spine performed 03/07/2012, and abdominal ultrasound performed 08/10/2012  Findings: There is a persistent somewhat loculated small right pleural effusion, with associated pleural thickening.  Round airspace opacity at the right lower lobe again likely reflects round atelectasis.  Both of these findings are generally stable from 2012.  Scattered coronary artery calcifications are seen.  A small to moderate amount of free fluid is noted within the lower abdomen and pelvis, tracking predominately along the left paracolic gutter and about small bowel loops.  This is slightly decreased from the prior study, and reflects the patient's peritoneal dialysis.  The degree of soft tissue edema within the small bowel mesentery is relatively stable, and may reflect reactive change secondary to intra-abdominal fluid.  A vague 2.6 cm partially hypodense heterogeneous lesion within the right hepatic lobe is nonspecific.  The liver is decreased in size, with mild nodularity, compatible with cirrhosis. The patient's TIPS is grossly unchanged in appearance; its patency is not well assessed on this study.  The spleen is unremarkable.  The pancreas is somewhat atrophic; dense calcification at the pancreatic head likely reflects sequelae of chronic pancreatitis.  Scattered peripancreatic nodes are grossly unchanged.  The adrenal glands are within normal limits.  Chronic bilateral renal atrophy is noted.  Nonspecific perinephric stranding is noted  bilaterally.  Scattered tiny renal cysts are again noted.  There is no evidence of hydronephrosis.  No renal or ureteral stones are seen.  There is suggestion of mild irregular wall thickening along the jejunum, which could reflect a mild infectious process, or could be reactive in nature.  The patient's peritoneal dialysis catheter is noted ending along the anterior left hemipelvis, above the bladder. The stomach is within normal limits.  No acute vascular abnormalities are seen.  Diffuse calcification is noted along the abdominal aorta and its branches, including at the origins of both renal arteries.  The appendix is normal in caliber and contains air.  Contrast progresses to the level of the ascending colon.  Previously noted soft tissue inflammation at the proximal sigmoid colon appears have resolved.  The colon is grossly unremarkable in appearance.  The bladder is mildly distended and grossly unremarkable.  The prostate remains normal in size.  No inguinal lymphadenopathy is seen.  No acute osseous abnormalities are identified.  There is partial chronic osseous fusion at L1-L2.  IMPRESSION:  1.  Previously noted changes suggestive of focal colitis have resolved. 2.  Apparent irregular wall thickening along much of the jejunum, possibly reflecting an acute infectious process. 3.  Small to moderate volume ascites likely reflects peritoneal dialysis, slightly decreased in volume from the prior study. Diffuse mesenteric edema may be reactive secondary to surrounding fluid. 4.  Small right-sided pleural effusion, with loculation and pleural thickening.  Rounded right lower lobe airspace disease appears  to reflect rounded atelectasis.  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 6.  Patency of the TIPS is not well assessed on this study; cirrhotic change again noted.  2.6 cm heterogeneous lesion within the right hepatic lobe is somewhat better characterized  than on prior studies.  Though this could simply be postoperative, given adjacent underlying calcification, dynamic liver protocol MRI or CT could be considered for further evaluation.  MRI should only be performed if the patient can hold his breath for the study. 7.  Chronic bilateral renal atrophy noted, with scattered tiny bilateral renal cysts. 8.  Diffuse calcification along the abdominal aorta and its branches, including at the origins of both renal arteries.   Original Report Authenticated By: Tonia Ghent, M.D.    Dg Chest Portable 1 View  10/26/2012  *RADIOLOGY REPORT*  Clinical Data: Abdominal pain  PORTABLE CHEST - 1 VIEW  Comparison: 4/11/ 14  Findings: Cardiomegaly again noted.  No acute infiltrate or pulmonary edema.  Stable right basilar scarring.  Mild hyperinflation.  IMPRESSION: .  Cardiomegaly.  Stable right basilar scarring.  No acute infiltrate or pulmonary edema.   Original Report Authenticated By: Natasha Mead, M.D.     Review of Systems  Respiratory: Negative for shortness of breath.   Gastrointestinal: Positive for nausea, abdominal pain and constipation. Negative for vomiting.  Neurological: Positive for weakness.  Psychiatric/Behavioral:       Increased somnolence but arousable.   Blood pressure 121/60, pulse 115, temperature 98.5 F (36.9 C), temperature source Axillary, resp. rate 14, height 6\' 3"  (1.905 m), weight 104.3 kg (229 lb 15 oz), SpO2 98.00%. Physical Exam  Neck: No JVD present.  Cardiovascular: Normal rate and regular rhythm.   No murmur heard. Respiratory: No respiratory distress.  GI: He exhibits distension. There is tenderness. There is rebound.  Musculoskeletal: He exhibits no edema.    Assessment/Plan: Problem #1 altered mental status most likely this is secondary to hepatic encephalopathy. At this moment he stayed he is taking his lactulose.  Problem #2 end-stage renal disease is status post  hemodialysis when he was here and his BUN creatinine is  high. he said  he has done an exchange but not sure whether he is able to do at home with recurrence of his hepatic encephalopathy. His BUN is high. And as this moment he doesn't have any PD fluid history. Problem #3 anemia. It is a combination of end-stage renal disease and GI bleeding. Problem #4 abdominal pain this seems to be chronic. Presently he doesn't have any PD fluid and can't do an exchange here. Previous workup didn't reveal peritonitis. Patient has been on antibiotics as this moment not sure whether he has peritonitis or not. His abdominal pain however dates back after the catheter was put without doing any exchange. Since patient complaints of abdominal pain for sometime very difficult to know the true story. Problem #5 diabetes Problem # #6 metabolic bone disease Problem #7 leukocytosis this is to be secondary to distal colitis patient was on antibiotics. CT scan shows some improvement. Plan: We'll make arrangements for patient to get dialysis today I have discussed with the patient possibly may need to take out the PD catheter and patient at this moment didn't answer yes or no. Presently can't rule out peritonitis if patient doesn't improve possibly we may need to transfer patient wire the PD fluid will be checked in Removed. We'll Check CBC, Basic Metabolic Panel and Phosphorus  in the Morning.   Timothy Chan S 10/27/2012, 7:43 AM

## 2012-10-27 NOTE — Clinical Social Work Note (Signed)
CSW received consult for potential placement. Pt is very noncompliant with PD and left AMA just a few days ago. MD began discussion with pt's son/HCPOA regarding pt's poor prognosis. Son plans to discuss with rest of family. Pt receiving hemodialysis today. Too early to determine appropriate disposition as pt just admitted and family's decision regarding MD conversation will also help establish d/c plans. CSW to continue to follow.  Derenda Fennel, [place] [phone]

## 2012-10-27 NOTE — Progress Notes (Signed)
PT DIALYSIS IS COMPLETED. PT TOLERATED DIALYSIS FAIRLY. SOME EPISODES OF HYPOTENSION. PT'S FAMILY/ESP SONS, HAVE BEEN IN PHONE CONTACT THROUGHOUT THE DAY.

## 2012-10-27 NOTE — Progress Notes (Addendum)
ANTIBIOTIC CONSULT NOTE - INITIAL  Pharmacy Consult for Vancomycin and Zosyn Indication: suspected bacterial peritonitis/empyema  Allergies  Allergen Reactions  . Tylenol (Acetaminophen) Other (See Comments)    cirrhosis  . Morphine And Related Itching   Patient Measurements: Height: 6\' 3"  (190.5 cm) Weight: 229 lb 15 oz (104.3 kg) IBW/kg (Calculated) : 84.5 Adjusted Body Weight: 92Kg  Vital Signs: Temp: 98.3 F (36.8 C) (04/18 0800) Temp src: Axillary (04/18 0800) BP: 137/73 mmHg (04/18 0800) Pulse Rate: 111 (04/18 0800) Intake/Output from previous day: 04/17 0701 - 04/18 0700 In: 750 [P.O.:240; I.V.:10; IV Piggyback:500] Out: -  Intake/Output from this shift: Total I/O In: 580 [P.O.:60; I.V.:520] Out: -   Labs:  Recent Labs  10/26/12 1634 10/27/12 0446  WBC 17.3* 27.7*  HGB 11.4* 9.3*  PLT 149* 117*  CREATININE 7.55* 7.96*   Estimated Creatinine Clearance: 12.7 ml/min (by C-G formula based on Cr of 7.96). No results found for this basename: VANCOTROUGH, Leodis Binet, VANCORANDOM, GENTTROUGH, GENTPEAK, GENTRANDOM, TOBRATROUGH, TOBRAPEAK, TOBRARND, AMIKACINPEAK, AMIKACINTROU, AMIKACIN,  in the last 72 hours   Microbiology: Recent Results (from the past 720 hour(s))  MRSA PCR SCREENING     Status: None   Collection Time    10/26/12 10:10 PM      Result Value Range Status   MRSA by PCR NEGATIVE  NEGATIVE Final   Comment:            The GeneXpert MRSA Assay (FDA     approved for NASAL specimens     only), is one component of a     comprehensive MRSA colonization     surveillance program. It is not     intended to diagnose MRSA     infection nor to guide or     monitor treatment for     MRSA infections.  CULTURE, BLOOD (ROUTINE X 2)     Status: None   Collection Time    10/26/12 10:39 PM      Result Value Range Status   Specimen Description BLOOD RIGHT ARM   Final   Special Requests     Final   Value: BOTTLES DRAWN AEROBIC AND ANAEROBIC AEB 6CC ANA 4CC   Culture PENDING   Incomplete   Report Status PENDING   Incomplete  CULTURE, BLOOD (ROUTINE X 2)     Status: None   Collection Time    10/26/12 10:44 PM      Result Value Range Status   Specimen Description BLOOD RIGHT ARM   Final   Special Requests     Final   Value: BOTTLES DRAWN AEROBIC AND ANAEROBIC AEB 6CC ANA 5CC   Culture PENDING   Incomplete   Report Status PENDING   Incomplete   Medical History: Past Medical History  Diagnosis Date  . Rectal bleeding   . Dialysis care   . GI bleed   . Hypertension   . Esophageal varices   . Alcoholic liver disease   . Hepatic encephalopathy   . Ascites   . Diabetes mellitus   . Detached retina   . Renal disorder    Medications:  Scheduled:  . [COMPLETED] fentaNYL  12.5 mcg Intravenous Once  . insulin aspart  0-9 Units Subcutaneous Q4H  . metoprolol succinate  25 mg Oral Daily  . [COMPLETED] morphine  2 mg Intravenous Once  . [COMPLETED] ondansetron (ZOFRAN) IV  4 mg Intravenous Once  . [COMPLETED] ondansetron  4 mg Intravenous Once  . [COMPLETED] pantoprazole (PROTONIX) IV  40 mg Intravenous Once  . pantoprazole (PROTONIX) IV  40 mg Intravenous Q12H  . piperacillin-tazobactam (ZOSYN)  IV  2.25 g Intravenous Q8H  . [START ON 10/28/2012] pneumococcal 23 valent vaccine  0.5 mL Intramuscular Tomorrow-1000  . rifaximin  275 mg Oral BID  . [COMPLETED] sodium chloride  250 mL Intravenous Once  . [COMPLETED] sodium chloride  500 mL Intravenous Once  . sodium chloride  500 mL Intravenous Once  . sodium chloride  10-40 mL Intracatheter Q12H  . sodium chloride  3 mL Intravenous Q12H  . [COMPLETED] vancomycin  1,000 mg Intravenous Once  . [DISCONTINUED] cefTRIAXone (ROCEPHIN)  IV  1 g Intravenous Q24H   Assessment: 61yo obese male with ESRD and reportedly getting peritoneal dialysis but poorly compliant.  Pt given Zosyn and Vancomycin on admission.  Estimated Creatinine Clearance: 12.7 ml/min (by C-G formula based on Cr of 7.96).   Need to  f/u and clarify dialysis schedule.  Goal of Therapy:  Pre-dialysis vancomycin level 15-25  Plan: Zosyn 2.25gm IV q8hrs (renally adjusted) Vancomycin 1gm IV after each dialysis Check pre-dialysis level at steady state Monitor labs, renal fxn, and cultures Duration of therapy per MD  Valrie Hart A 10/27/2012,9:31 AM

## 2012-10-27 NOTE — Progress Notes (Addendum)
INITIAL NUTRITION ASSESSMENT  DOCUMENTATION CODES Per approved criteria  -Not Applicable   INTERVENTION: When diet is advanced:  Add ProStat 30 ml TID (each 30 ml provides 100 kcal, 15 gr protein)  Nepro per protocol   NUTRITION DIAGNOSIS: 1. Inadequate oral intake related to inability to eat as evidenced by NPO status.  Goal: Pt to meet >/= 90% of their estimated nutrition needs  Monitor:  Po intake, labs and wt trends, skin assessments  Reason for Assessment: Malnurtriton Screen/ Low Braden Score=11  62 y.o. male  Admitting Dx: SIRS (systemic inflammatory response syndrome)  ASSESSMENT: Hx of ETOH abuse, liver disease s/p TIPS, hepatic encephalopathy, esophogeal varices, ESRD with peritoneal dialysis tx (poor compliance).  RD drawn to pt due to Low Braden Score=11 and malnutrition screen. Wt hx reveals stable wt for > 12 months. Currently has generalized edema and receives diaylsis therefore fluid may be masking actual dry wt decrease. He is at risk for malnutrition given his medical hx and current acute illness.  Height: Ht Readings from Last 1 Encounters:  10/26/12 6\' 3"  (1.905 m)    Weight: Wt Readings from Last 1 Encounters:  10/26/12 229 lb 15 oz (104.3 kg)    Ideal Body Weight: 196# (89 kg)  % Ideal Body Weight: 117%  Wt Readings from Last 10 Encounters:  10/26/12 229 lb 15 oz (104.3 kg)  10/24/12 226 lb (102.513 kg)  10/24/12 226 lb (102.513 kg)  09/18/12 226 lb (102.513 kg)  08/08/12 229 lb 1.6 oz (103.919 kg)  05/26/12 208 lb (94.348 kg)  04/10/12 229 lb 6.4 oz (104.055 kg)  03/26/11 224 lb (101.606 kg)  03/26/11 224 lb (101.606 kg)  03/25/11 229 lb 4.5 oz (104 kg)    Usual Body Weight: 225-230#  % Usual Body Weight: 100 %   BMI:  Body mass index is 28.74 kg/(m^2). Overweight  Estimated Nutritional Needs: Kcal: 5621-3086 Protein: 106-115 gr Fluid: 1000 ml plus output  Skin: Stage 1 bilateral buttocks, generalized edema  Diet Order:  NPO  EDUCATION NEEDS: -Education not appropriate at this time   Intake/Output Summary (Last 24 hours) at 10/27/12 1446 Last data filed at 10/27/12 1200  Gross per 24 hour  Intake   1740 ml  Output      0 ml  Net   1740 ml    Last BM: 10/26/12  Labs:   Recent Labs Lab 10/21/12 1100 10/22/12 0718 10/23/12 0404 10/24/12 0525 10/26/12 1634 10/27/12 0446  NA 134* 137 139 138 137 135  K 3.4* 3.7 3.6 4.1 4.1 4.7  CL 93* 99 100 102 94* 96  CO2 25 26 23 28 29 24   BUN 108* 63* 82* 47* 63* 72*  CREATININE 9.38* 6.69* 7.77* 5.40* 7.55* 7.96*  CALCIUM 8.6 8.2* 8.4 8.1* 9.2 8.2*  MG 2.4  --   --   --   --   --   PHOS  --  6.4* 6.7* 5.7*  --   --   GLUCOSE 112* 112* 218* 168* 390* 402*    CBG (last 3)   Recent Labs  10/27/12 0408 10/27/12 0739 10/27/12 1222  GLUCAP 374* 323* 280*    Scheduled Meds: . insulin aspart  0-9 Units Subcutaneous Q4H  . metoprolol succinate  25 mg Oral Daily  . pantoprazole (PROTONIX) IV  40 mg Intravenous Q12H  . piperacillin-tazobactam (ZOSYN)  IV  2.25 g Intravenous Q8H  . [START ON 10/28/2012] pneumococcal 23 valent vaccine  0.5 mL Intramuscular Tomorrow-1000  .  rifaximin  275 mg Oral BID  . sodium chloride  10-40 mL Intracatheter Q12H  . sodium chloride  3 mL Intravenous Q12H  . vancomycin  1,000 mg Intravenous Q M,W,F-HD    Continuous Infusions: . sodium chloride 100 mL/hr at 10/27/12 1200    Past Medical History  Diagnosis Date  . Rectal bleeding   . Dialysis care   . GI bleed   . Hypertension   . Esophageal varices   . Alcoholic liver disease   . Hepatic encephalopathy   . Ascites   . Diabetes mellitus   . Detached retina   . Renal disorder     Past Surgical History  Procedure Laterality Date  . Esophagogastroduodenoscopy  12/31/2010    EGD [facility] THERAPY  . Esophagogastroduodenoscopy  05/31/2012E    GD [facility] ABLATION  . Small bowel givens  12/01/2010  . Colonoscopy  09/07/2010  . Esophagogastroduodenoscopy   09/05/2010    OUTLAW  . Lung surgery  6/11    [place]  . Esophagogastroduodenoscopy  03/26/2011    Procedure: ESOPHAGOGASTRODUODENOSCOPY (EGD);  Surgeon: Malissa Hippo, MD;  Location: [facility];  Service: Endoscopy;  Laterality: N/A;  8:30 am  . Hot hemostasis  03/26/2011    Procedure: HOT HEMOSTASIS (ARGON PLASMA COAGULATION/BICAP);  Surgeon: Malissa Hippo, MD;  Location: [facility];  Service: Endoscopy;  Laterality: N/A;  . Stent in liver    . Esophagogastroduodenoscopy N/A 10/24/2012    Procedure: ESOPHAGOGASTRODUODENOSCOPY (EGD);  Surgeon: Malissa Hippo, MD;  Location: [facility];  Service: Endoscopy;  Laterality: N/A;    Royann Shivers MS,RD,LDN,CSG Office: [phone] Pager: [phone]

## 2012-10-28 LAB — GLUCOSE, CAPILLARY
Glucose-Capillary: 165 mg/dL — ABNORMAL HIGH (ref 70–99)
Glucose-Capillary: 149 mg/dL — ABNORMAL HIGH (ref 70–99)
Glucose-Capillary: 143 mg/dL — ABNORMAL HIGH (ref 70–99)
Glucose-Capillary: 197 mg/dL — ABNORMAL HIGH (ref 70–99)

## 2012-10-28 LAB — CBC
Hemoglobin: 8.1 g/dL — ABNORMAL LOW (ref 13.0–17.0)
Hemoglobin: 9 g/dL — ABNORMAL LOW (ref 13.0–17.0)
MCH: 32.3 pg (ref 26.0–34.0)
MCH: 34 pg (ref 26.0–34.0)
MCHC: 35.9 g/dL (ref 30.0–36.0)
MCV: 94.7 fL (ref 78.0–100.0)
Platelets: 93 10*3/uL — ABNORMAL LOW (ref 150–400)
RBC: 2.51 MIL/uL — ABNORMAL LOW (ref 4.22–5.81)
RBC: 2.65 MIL/uL — ABNORMAL LOW (ref 4.22–5.81)

## 2012-10-28 LAB — URINALYSIS, ROUTINE W REFLEX MICROSCOPIC
Glucose, UA: 250 mg/dL — AB
Leukocytes, UA: NEGATIVE
Protein, ur: 30 mg/dL — AB
Urobilinogen, UA: 0.2 mg/dL (ref 0.0–1.0)

## 2012-10-28 LAB — URINE MICROSCOPIC-ADD ON

## 2012-10-28 LAB — TYPE AND SCREEN: Antibody Screen: NEGATIVE

## 2012-10-28 LAB — BASIC METABOLIC PANEL
CO2: 29 mEq/L (ref 19–32)
GFR calc non Af Amer: 12 mL/min — ABNORMAL LOW (ref >90)
Glucose, Bld: 158 mg/dL — ABNORMAL HIGH (ref 70–99)
Potassium: 4.4 mEq/L (ref 3.5–5.1)
Sodium: 138 mEq/L (ref 135–145)

## 2012-10-28 MED ORDER — FLUCONAZOLE IN SODIUM CHLORIDE 200-0.9 MG/100ML-% IV SOLN
200.0000 mg | INTRAVENOUS | Status: DC
  Administered 2012-10-28: 200 mg via INTRAVENOUS
  Filled 2012-10-28 (×2): qty 100

## 2012-10-28 MED ORDER — PROMETHAZINE HCL 12.5 MG PO TABS
12.5000 mg | ORAL_TABLET | Freq: Four times a day (QID) | ORAL | Status: DC | PRN
  Administered 2012-10-28: 12.5 mg via ORAL
  Filled 2012-10-28: qty 1

## 2012-10-28 MED ORDER — GUAIFENESIN 100 MG/5ML PO SOLN
200.0000 mg | ORAL | Status: DC | PRN
  Administered 2012-10-28 – 2012-11-01 (×13): 200 mg via ORAL
  Filled 2012-10-28: qty 118
  Filled 2012-10-28 (×2): qty 10
  Filled 2012-10-28: qty 5
  Filled 2012-10-28: qty 10
  Filled 2012-10-28: qty 5
  Filled 2012-10-28 (×4): qty 10
  Filled 2012-10-28: qty 5
  Filled 2012-10-28: qty 10
  Filled 2012-10-28: qty 5
  Filled 2012-10-28: qty 10
  Filled 2012-10-28: qty 5
  Filled 2012-10-28: qty 10
  Filled 2012-10-28: qty 5
  Filled 2012-10-28: qty 10

## 2012-10-28 MED ORDER — FLUCONAZOLE IN SODIUM CHLORIDE 200-0.9 MG/100ML-% IV SOLN
200.0000 mg | INTRAVENOUS | Status: DC
  Administered 2012-10-30: 200 mg via INTRAVENOUS
  Filled 2012-10-28 (×2): qty 100

## 2012-10-28 MED ORDER — VITAMIN K1 10 MG/ML IJ SOLN
5.0000 mg | Freq: Two times a day (BID) | INTRAMUSCULAR | Status: AC
  Administered 2012-10-28 – 2012-10-29 (×4): 5 mg via INTRAVENOUS
  Filled 2012-10-28 (×5): qty 0.5

## 2012-10-28 NOTE — Consult Note (Signed)
Aked to see the patient concerning removal of peritoneal dialysis catheter. Have discussed this with Dr. Karilyn Cota. This can be done under monitored anesthesia care in the operating room. His INR is significantly elevated and must be treated prior to any surgical removal. We'll start vitamin K IV. Will recheck labs in a.m.

## 2012-10-28 NOTE — Progress Notes (Signed)
Subjective; Patient states he does not feel well. He complains of generalized abdominal pain. He says the pain has not used any since hospitalization. He's been coughing all night. He denies chest pain or shortness of breath. No melena or rectal bleeding reported. Objective; BP 147/59  Pulse 97  Temp(Src) 97.9 F (36.6 C) (Oral)  Resp 20  Ht 6\' 3"  (1.905 m)  Wt 229 lb 15 oz (104.3 kg)  BMI 28.74 kg/m2  SpO2 93% Patient is alert today but he still has asterixis. Abdomen is full with normal bowel sounds. He has a generalized moderate tenderness with some guarding. 1700 mL of fluid was aspirated using PD cannulae which has not been removed yet. Lab data; INR 2.68. WBC 16.5, H&H 8.1 and 23.9 and platelet count 90 K.  Gram stain reveals abundant WBCs and gram-positive cocci in pairs. Ascitic fluid analysis; WBC 3866, neutrophils 89% Protein 0.8 g/dL. Assessment; #1. Bacterial peritonitis. Gram stain reveals gram-positive cocci in pairs. ID is pending. PD cannulae has not been removed yet. 1.7 L of fluid was suctioned out with symptomatic improvement. We also need to cover him for fungal infection since he has foreign body in his peritoneal cavity. #2. Anemia. H&H is down but no evidence of overt bleed. #3 hepatic encephalopathy worse with sepsis. #4. Worsening coagulopathy. Patient receiving vitamin K. Main concern would be one of GI see with sepsis but platelet count has not dropped significantly. Recommendations; Fluconazole 200 mg IV every 48 hours. Remove PD cannulae as soon as possible. Monitor H&H. Repeat INR in a.m.

## 2012-10-28 NOTE — Progress Notes (Signed)
Subjective: Interval History: has complaints with sputum production. Still he has abdominal pain. His appetite is poor he doesn't have any nausea vomiting. His breathing is okay..  Objective: Vital signs in last 24 hours: Temp:  [97.9 F (36.6 C)-98.8 F (37.1 C)] 98 F (36.7 C) (04/19 0400) Pulse Rate:  [83-118] 97 (04/19 0800) Resp:  [11-23] 21 (04/19 0800) BP: (79-154)/(45-74) 154/74 mmHg (04/19 0800) SpO2:  [95 %-100 %] 96 % (04/19 0800) Weight change:   Intake/Output from previous day: 04/18 0701 - 04/19 0700 In: 2893.3 [P.O.:120; I.V.:2423.3; IV Piggyback:350] Out: 1470 [Urine:425] Intake/Output this shift: Total I/O In: 250 [P.O.:200; I.V.:50] Out: -   General appearance: alert, cooperative and no distress Resp: diminished breath sounds bilaterally and rhonchi bilaterally Cardio: regular rate and rhythm, S1, S2 normal, no murmur, click, rub or gallop GI: Abdomen is full, some tenderness and positive bowel sound. Extremities: extremities normal, atraumatic, no cyanosis or edema  Lab Results:  Recent Labs  10/27/12 0446 10/28/12 0434  WBC 27.7* 16.5*  HGB 9.3* 8.1*  HCT 27.3* 23.9*  PLT 117* 90*   BMET:  Recent Labs  10/27/12 0446 10/28/12 0434  NA 135 138  K 4.7 4.4  CL 96 102  CO2 24 29  GLUCOSE 402* 158*  BUN 72* 36*  CREATININE 7.96* 4.70*  CALCIUM 8.2* 8.0*   No results found for this basename: PTH,  in the last 72 hours Iron Studies: No results found for this basename: IRON, TIBC, TRANSFERRIN, FERRITIN,  in the last 72 hours  Studies/Results: Ct Abdomen Pelvis W Contrast  10/27/2012  *RADIOLOGY REPORT*  Clinical Data: Acute abdominal pain, vomiting and jaundice.  CT ABDOMEN AND PELVIS WITH CONTRAST  Technique:  Multidetector CT imaging of the abdomen and pelvis was performed following the standard protocol during bolus administration of intravenous contrast.  Contrast: 100 mL of Omnipaque 300 IV contrast  Comparison: CT of the abdomen and pelvis  performed 10/16/2012, MRI of the lumbar spine performed 03/07/2012, and abdominal ultrasound performed 08/10/2012  Findings: There is a persistent somewhat loculated small right pleural effusion, with associated pleural thickening.  Round airspace opacity at the right lower lobe again likely reflects round atelectasis.  Both of these findings are generally stable from 2012.  Scattered coronary artery calcifications are seen.  A small to moderate amount of free fluid is noted within the lower abdomen and pelvis, tracking predominately along the left paracolic gutter and about small bowel loops.  This is slightly decreased from the prior study, and reflects the patient's peritoneal dialysis.  The degree of soft tissue edema within the small bowel mesentery is relatively stable, and may reflect reactive change secondary to intra-abdominal fluid.  A vague 2.6 cm partially hypodense heterogeneous lesion within the right hepatic lobe is nonspecific.  The liver is decreased in size, with mild nodularity, compatible with cirrhosis. The patient's TIPS is grossly unchanged in appearance; its patency is not well assessed on this study.  The spleen is unremarkable.  The pancreas is somewhat atrophic; dense calcification at the pancreatic head likely reflects sequelae of chronic pancreatitis.  Scattered peripancreatic nodes are grossly unchanged.  The adrenal glands are within normal limits.  Chronic bilateral renal atrophy is noted.  Nonspecific perinephric stranding is noted bilaterally.  Scattered tiny renal cysts are again noted.  There is no evidence of hydronephrosis.  No renal or ureteral stones are seen.  There is suggestion of mild irregular wall thickening along the jejunum, which could reflect a mild infectious  process, or could be reactive in nature.  The patient's peritoneal dialysis catheter is noted ending along the anterior left hemipelvis, above the bladder. The stomach is within normal limits.  No acute  vascular abnormalities are seen.  Diffuse calcification is noted along the abdominal aorta and its branches, including at the origins of both renal arteries.  The appendix is normal in caliber and contains air.  Contrast progresses to the level of the ascending colon.  Previously noted soft tissue inflammation at the proximal sigmoid colon appears have resolved.  The colon is grossly unremarkable in appearance.  The bladder is mildly distended and grossly unremarkable.  The prostate remains normal in size.  No inguinal lymphadenopathy is seen.  No acute osseous abnormalities are identified.  There is partial chronic osseous fusion at L1-L2.  IMPRESSION:  1.  Previously noted changes suggestive of focal colitis have resolved. 2.  Apparent irregular wall thickening along much of the jejunum, possibly reflecting an acute infectious process. 3.  Small to moderate volume ascites likely reflects peritoneal dialysis, slightly decreased in volume from the prior study. Diffuse mesenteric edema may be reactive secondary to surrounding fluid. 4.  Small right-sided pleural effusion, with loculation and pleural thickening.  Rounded right lower lobe airspace disease appears to reflect rounded atelectasis.  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 6.  Patency of the TIPS is not well assessed on this study; cirrhotic change again noted.  2.6 cm heterogeneous lesion within the right hepatic lobe is somewhat better characterized than on prior studies.  Though this could simply be postoperative, given adjacent underlying calcification, dynamic liver protocol MRI or CT could be considered for further evaluation.  MRI should only be performed if the patient can hold his breath for the study. 7.  Chronic bilateral renal atrophy noted, with scattered tiny bilateral renal cysts. 8.  Diffuse calcification along the abdominal aorta and its branches, including at the origins of both  renal arteries.   Original Report Authenticated By: Tonia Ghent, M.D.    Dg Chest Portable 1 View  10/26/2012  *RADIOLOGY REPORT*  Clinical Data: Abdominal pain  PORTABLE CHEST - 1 VIEW  Comparison: 4/11/ 14  Findings: Cardiomegaly again noted.  No acute infiltrate or pulmonary edema.  Stable right basilar scarring.  Mild hyperinflation.  IMPRESSION: .  Cardiomegaly.  Stable right basilar scarring.  No acute infiltrate or pulmonary edema.   Original Report Authenticated By: Natasha Mead, M.D.     I have reviewed the patient's current medications.  Assessment/Plan: Problem #1 altered mental status secondary to hepatic encephalopathy today patient is more alert and answering to questions. Problem #2 abdominal pain presently seems to be secondary to peritonitis. He has leukocytosis, bacteria and abdominal pain. Patient is on antibiotics and culture is pending. Problem #3 end-stage renal disease she status post hemodialysis yesterday BUN and creatinine has improved and normal potassium. Problem #4 anemia multifactorial his hemoglobin and hematocrit is low. Presently patient is being followed by GI.  Problem #5 metabolic bone disease calcium and phosphorus was in reasonable range for a patient with end-stage renal disease. Problem #6 diabetes Problem #7 liver cirrhosis Plan: Patient has agreed for removal of his PD catheter and at this moment will call surgery to remove it. We'll send the tip for culture and sensitivity. We'll check his basic metabolic panel, CBC in the morning. Presently patient doesn't need any dialysis but will follow his condition on daily basis.  LOS: 2 days   Rosamond Andress S 10/28/2012,9:21 AM

## 2012-10-28 NOTE — Progress Notes (Signed)
DR [facility] IN TO EXAMINE PT. PT C/O SEVERE THROBBING PAIN IN ABDOMAN. WHILE DR [facility] AT BESIDE , 1700CC CLOUDY YELLOW FLUID DRAW FROM PERITONEL CATHETER. PT TOLERATED WELL. PT NOW RESTING MORE COMFORTABLLY.

## 2012-10-28 NOTE — Progress Notes (Signed)
PT BEING TRANSFERRED TO ROOM 338. PT ALERT AND ORIENTED. SKIN WARM AND DRY. RT FEMEROL CATHETER PATENT AND INFUSING. O2 SAT 97% ON ROOM AIR. PERITONEL DIALYSIS CATHETER IS CLAMPED.hoarseness 105 IN [facility]. REPORT CALLED TO ERICA RN ON 300.PT WILL BE TRANSFER IN HIS BED.

## 2012-10-28 NOTE — Progress Notes (Signed)
Timothy Chan has remained pain free tonight.  He finally voided at 0100 only 100cc, dark concentrated urine.  No bm again tonight after 5 doses of Lactulose,  Next dose at 0500.

## 2012-10-28 NOTE — Progress Notes (Signed)
Subjective: This man was admitted with abdominal pain and sepsis picture. He has end-stage renal disease and is supposed to be doing peritoneal dialysis but compliance is poor. 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 He has not been deemed to be a candidate for liver or renal transplantation. He was hospitalized just a few days ago and left AMA , despite multiple attempts to convince him to stay in the hospital. This is a pattern that he seems to follow. Today he seems to be much improved and is alert, his abdominal pain has improved. Peritoneal fluid is showing gram-positive cocci in pairs.           Physical Exam: Blood pressure 135/67, pulse 102, temperature 98 F (36.7 C), temperature source Oral, resp. rate 15, height 6\' 3"  (1.905 m), weight 104.3 kg (229 lb 15 oz), SpO2 95.00%. He looks improved this morning. He is more alert. He still has asterixis but less so. His peripheries are warm. Abdomen is soft and less tender, more stronger left than the right. Bowel sounds are heard. Heart sounds are present and in sinus rhythm. He has gynecomastia. There are no obvious focal neurological signs. He does not have peripheral pitting edema in his legs.   Investigations:  Recent Results (from the past 240 hour(s))  MRSA PCR SCREENING     Status: None   Collection Time    10/26/12 10:10 PM      Result Value Range Status   MRSA by PCR NEGATIVE  NEGATIVE Final   Comment:            The GeneXpert MRSA Assay (FDA     approved for NASAL specimens     only), is one component of a     comprehensive MRSA colonization     surveillance program. It is not     intended to diagnose MRSA     infection nor to guide or     monitor treatment for     MRSA infections.  CULTURE, BLOOD (ROUTINE X 2)     Status: None   Collection Time    10/26/12 10:39 PM      Result Value Range Status   Specimen Description BLOOD RIGHT ARM   Final   Special Requests     Final   Value: BOTTLES DRAWN AEROBIC AND ANAEROBIC AEB 6CC ANA 4CC   Culture NO GROWTH 1 DAY   Final   Report Status PENDING   Incomplete  CULTURE, BLOOD (ROUTINE X 2)     Status: None   Collection Time    10/26/12 10:44 PM      Result Value Range Status   Specimen Description BLOOD RIGHT ARM   Final   Special Requests     Final   Value: BOTTLES DRAWN AEROBIC AND ANAEROBIC AEB 6CC ANA 5CC   Culture NO GROWTH 1 DAY   Final   Report Status PENDING   Incomplete  CULTURE, BODY FLUID-BOTTLE     Status: None   Collection Time    10/27/12  1:01 PM      Result Value Range Status   Specimen Description PERITONEAL   Final   Special Requests     Final   Value: BOTTLES DRAWN AEROBIC AND ANAEROBIC 6 CC EACH BOTTLE   Culture PENDING   Incomplete   Report Status PENDING   Incomplete  GRAM STAIN     Status: None   Collection Time    10/27/12  1:06 PM      Result Value Range Status   Specimen Description PERITONEAL   Final   Special Requests NONE   Final   Gram Stain ABUNDANT WBC SEEN GRAM POSITIVE COCCI IN PAIRS   Final   Report Status 10/27/2012 FINAL   Final     Basic Metabolic Panel:  Recent Labs  82/95/62 0446 10/28/12 0434  NA 135 138  K 4.7 4.4  CL 96 102  CO2 24 29  GLUCOSE 402* 158*  BUN 72* 36*  CREATININE 7.96* 4.70*  CALCIUM 8.2* 8.0*  PHOS  --  5.1*   Liver Function Tests:  Recent Labs  10/26/12 1634 10/27/12 0446  AST 28 27  ALT 35 25  ALKPHOS 164* 114  BILITOT 1.3* 0.6  PROT 7.0 5.2*  ALBUMIN 2.4* 1.6*     CBC:  Recent Labs  10/26/12 1634 10/27/12 0446 10/28/12 0434  WBC 17.3* 27.7* 16.5*  NEUTROABS 15.5*  --   --   HGB 11.4* 9.3* 8.1*  HCT 33.8* 27.3* 23.9*  MCV 93.1 93.8 95.2  PLT 149* 117* 90*    Ct Abdomen Pelvis W Contrast  10/27/2012  *RADIOLOGY REPORT*  Clinical Data: Acute abdominal pain, vomiting and jaundice.  CT ABDOMEN AND PELVIS WITH CONTRAST  Technique:  Multidetector CT imaging of the abdomen and pelvis was performed following the  standard protocol during bolus administration of intravenous contrast.  Contrast: 100 mL of Omnipaque 300 IV contrast  Comparison: CT of the abdomen and pelvis performed 10/16/2012, MRI of the lumbar spine performed 03/07/2012, and abdominal ultrasound performed 08/10/2012  Findings: There is a persistent somewhat loculated small right pleural effusion, with associated pleural thickening.  Round airspace opacity at the right lower lobe again likely reflects round atelectasis.  Both of these findings are generally stable from 2012.  Scattered coronary artery calcifications are seen.  A small to moderate amount of free fluid is noted within the lower abdomen and pelvis, tracking predominately along the left paracolic gutter and about small bowel loops.  This is slightly decreased from the prior study, and reflects the patient's peritoneal dialysis.  The degree of soft tissue edema within the small bowel mesentery is relatively stable, and may reflect reactive change secondary to intra-abdominal fluid.  A vague 2.6 cm partially hypodense heterogeneous lesion within the right hepatic lobe is nonspecific.  The liver is decreased in size, with mild nodularity, compatible with cirrhosis. The patient's TIPS is grossly unchanged in appearance; its patency is not well assessed on this study.  The spleen is unremarkable.  The pancreas is somewhat atrophic; dense calcification at the pancreatic head likely reflects sequelae of chronic pancreatitis.  Scattered peripancreatic nodes are grossly unchanged.  The adrenal glands are within normal limits.  Chronic bilateral renal atrophy is noted.  Nonspecific perinephric stranding is noted bilaterally.  Scattered tiny renal cysts are again noted.  There is no evidence of hydronephrosis.  No renal or ureteral stones are seen.  There is suggestion of mild irregular wall thickening along the jejunum, which could reflect a mild infectious process, or could be reactive in nature.  The  patient's peritoneal dialysis catheter is noted ending along the anterior left hemipelvis, above the bladder. The stomach is within normal limits.  No acute vascular abnormalities are seen.  Diffuse calcification is noted along the abdominal aorta and its branches, including at the origins of both renal arteries.  The appendix is normal in caliber and contains air.  Contrast progresses to  the level of the ascending colon.  Previously noted soft tissue inflammation at the proximal sigmoid colon appears have resolved.  The colon is grossly unremarkable in appearance.  The bladder is mildly distended and grossly unremarkable.  The prostate remains normal in size.  No inguinal lymphadenopathy is seen.  No acute osseous abnormalities are identified.  There is partial chronic osseous fusion at L1-L2.  IMPRESSION:  1.  Previously noted changes suggestive of focal colitis have resolved. 2.  Apparent irregular wall thickening along much of the jejunum, possibly reflecting an acute infectious process. 3.  Small to moderate volume ascites likely reflects peritoneal dialysis, slightly decreased in volume from the prior study. Diffuse mesenteric edema may be reactive secondary to surrounding fluid. 4.  Small right-sided pleural effusion, with loculation and pleural thickening.  Rounded right lower lobe airspace disease appears to reflect rounded atelectasis.  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 6.  Patency of the TIPS is not well assessed on this study; cirrhotic change again noted.  2.6 cm heterogeneous lesion within the right hepatic lobe is somewhat better characterized than on prior studies.  Though this could simply be postoperative, given adjacent underlying calcification, dynamic liver protocol MRI or CT could be considered for further evaluation.  MRI should only be performed if the patient can hold his breath for the study. 7.  Chronic bilateral renal  atrophy noted, with scattered tiny bilateral renal cysts. 8.  Diffuse calcification along the abdominal aorta and its branches, including at the origins of both renal arteries.   Original Report Authenticated By: Tonia Ghent, M.D.    Dg Chest Portable 1 View  10/26/2012  *RADIOLOGY REPORT*  Clinical Data: Abdominal pain  PORTABLE CHEST - 1 VIEW  Comparison: 4/11/ 14  Findings: Cardiomegaly again noted.  No acute infiltrate or pulmonary edema.  Stable right basilar scarring.  Mild hyperinflation.  IMPRESSION: .  Cardiomegaly.  Stable right basilar scarring.  No acute infiltrate or pulmonary edema.   Original Report Authenticated By: Natasha Mead, M.D.       Medications: I have reviewed the patient's current medications.  Impression: 1. Bacterial peritonitis, gram-positive cocci in pairs. 2. 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 Not a candidate for liver transplantation. INR has increased today to 2.68. 3. End stage renal disease on peritoneal dialysis but compliance is a big issue. Not a candidate for renal transplantation. 4. Altered mental status/drowsiness secondary to #1, #2 and #3. Improving. 5. Anemia, hemoglobin 8.1 this morning, reduced from yesterday. 6. Poor IV access, status post right femoral vein central line.    Plan: 1. Continue with intravenous vancomycin and Zosyn. 2. Await full identification of organism in peritoneal fluid. 3. Will need to remove peritoneal catheter. Will discuss with nephrology, Dr Fausto Skillern. 4. Repeat CBC later today to see if he needs blood transfusion.      LOS: 2 days   Wilson Singer Pager [phone]  10/28/2012, 6:47 AM

## 2012-10-29 ENCOUNTER — Inpatient Hospital Stay (HOSPITAL_COMMUNITY): Payer: Medicare Other

## 2012-10-29 LAB — COMPREHENSIVE METABOLIC PANEL
ALT: 25 U/L (ref 0–53)
AST: 42 U/L — ABNORMAL HIGH (ref 0–37)
Albumin: 1.4 g/dL — ABNORMAL LOW (ref 3.5–5.2)
Alkaline Phosphatase: 111 U/L (ref 39–117)
CO2: 25 mEq/L (ref 19–32)
Chloride: 98 mEq/L (ref 96–112)
GFR calc non Af Amer: 9 mL/min — ABNORMAL LOW (ref >90)
Potassium: 4.8 mEq/L (ref 3.5–5.1)
Sodium: 133 mEq/L — ABNORMAL LOW (ref 135–145)
Total Bilirubin: 0.7 mg/dL (ref 0.3–1.2)

## 2012-10-29 LAB — GLUCOSE, CAPILLARY
Glucose-Capillary: 141 mg/dL — ABNORMAL HIGH (ref 70–99)
Glucose-Capillary: 157 mg/dL — ABNORMAL HIGH (ref 70–99)
Glucose-Capillary: 165 mg/dL — ABNORMAL HIGH (ref 70–99)
Glucose-Capillary: 195 mg/dL — ABNORMAL HIGH (ref 70–99)

## 2012-10-29 LAB — BODY FLUID CELL COUNT WITH DIFFERENTIAL
Lymphs, Fluid: 13 %
Monocyte-Macrophage-Serous Fluid: 7 % — ABNORMAL LOW (ref 50–90)
Neutrophil Count, Fluid: 79 % — ABNORMAL HIGH (ref 0–25)
Total Nucleated Cell Count, Fluid: 1099 cu mm — ABNORMAL HIGH (ref 0–1000)

## 2012-10-29 LAB — CBC
Platelets: 89 10*3/uL — ABNORMAL LOW (ref 150–400)
RBC: 2.49 MIL/uL — ABNORMAL LOW (ref 4.22–5.81)
RDW: 16.7 % — ABNORMAL HIGH (ref 11.5–15.5)
WBC: 10.4 10*3/uL (ref 4.0–10.5)

## 2012-10-29 LAB — URINE CULTURE

## 2012-10-29 LAB — PROTIME-INR: INR: 1.28 (ref 0.00–1.49)

## 2012-10-29 MED ORDER — SODIUM CHLORIDE 0.9 % IV SOLN: 100.0000 mL | INTRAVENOUS | Status: DC | PRN

## 2012-10-29 MED ORDER — CHLORHEXIDINE GLUCONATE 4 % EX LIQD
1.0000 "application " | Freq: Once | CUTANEOUS | Status: AC
  Administered 2012-10-30: 1 via TOPICAL
  Filled 2012-10-29: qty 60

## 2012-10-29 MED ORDER — ALTEPLASE 2 MG IJ SOLR
2.0000 mg | Freq: Once | INTRAMUSCULAR | Status: AC | PRN
  Filled 2012-10-29: qty 2

## 2012-10-29 NOTE — Progress Notes (Signed)
Subjective: Interval History: has complaints some abdominal pain. He said he's feeling better. His main complaint has this moment his cough which is non-productive. His appetite is still very poor..  Objective: Vital signs in last 24 hours: Temp:  [98 F (36.7 C)-99.6 F (37.6 C)] 99.2 F (37.3 C) (04/20 0300) Pulse Rate:  [97-112] 98 (04/20 0300) Resp:  [14-27] 15 (04/20 0300) BP: (136-169)/(53-102) 143/60 mmHg (04/20 0300) SpO2:  [92 %-98 %] 94 % (04/20 0300) Weight:  [106.1 kg (233 lb 14.5 oz)] 106.1 kg (233 lb 14.5 oz) (04/20 0300) Weight change:   Intake/Output from previous day: 04/19 0701 - 04/20 0700 In: 1483 [P.O.:680; I.V.:503; IV Piggyback:300] Out: 300 [Urine:300] Intake/Output this shift:    General appearance: alert, cooperative and no distress Resp: diminished breath sounds posterior - bilateral and rhonchi bilaterally Cardio: regular rate and rhythm, S1, S2 normal, no murmur, click, rub or gallop GI: Distended, tender and positive bowel sound. Extremities: extremities normal, atraumatic, no cyanosis or edema  Lab Results:  Recent Labs  10/28/12 1300 10/29/12 0620  WBC 13.9* 10.4  HGB 9.0* 8.0*  HCT 25.1* 23.4*  PLT 93* 89*   BMET:  Recent Labs  10/28/12 0434 10/29/12 0620  NA 138 133*  K 4.4 4.8  CL 102 98  CO2 29 25  GLUCOSE 158* 152*  BUN 36* 52*  CREATININE 4.70* 6.33*  CALCIUM 8.0* 8.0*   No results found for this basename: PTH,  in the last 72 hours Iron Studies: No results found for this basename: IRON, TIBC, TRANSFERRIN, FERRITIN,  in the last 72 hours  Studies/Results: No results found.  I have reviewed the patient's current medications.  Assessment/Plan: Problem #1 end-stage renal disease is status post hemodialysis on Friday BUN is 52 creatinine 6.3 and potassium is 4.8. Problem #2 anemia his hemoglobin is 8  And hematocrit is 23.4 seems to be declining.  Problem #3 hypertension blood pressure seems to be reasonably  controlled Problem #4 diabetes Problem #5 hepatic encephalopathy has improved Problem #6  peritonitis patient is on antibiotics and presently is a febrile with normal white blood cell count. Problem #7 metabolic bone disease calcium and phosphorus was in acceptable range. Plan: We'll make arrangements for patient to get dialysis tomorrow His Peritonial  dialysis catheter will be removed tomorrow We'll check his basic metabolic panel and CBC in the morning. We'll continue with Epogen at present dose.   LOS: 3 days   Jentri Aye S 10/29/2012,8:28 AM

## 2012-10-29 NOTE — Progress Notes (Signed)
Will remove peritoneal dialysis catheter tomorrow in the operating room. We'll transfuse 2 units of packed red blood cells. Risks and benefits of the procedure were fully explained to the patient, who gave informed consent.

## 2012-10-29 NOTE — Progress Notes (Signed)
Subjective; Patient states he is some better. He now rates his abdominal pain is 8. Coughing spells have decreased. He has poor appetite. He denies melena or rectal bleeding. Objective; BP 143/60  Pulse 98  Temp(Src) 99.2 F (37.3 C) (Oral)  Resp 15  Ht 6\' 3"  (1.905 m)  Wt 233 lb 14.5 oz (106.1 kg)  BMI 29.24 kg/m2  SpO2 94% He is alert. He appears to be chronically ill. Abdomen is distended with normal bowel sounds. Diffuse tenderness noted which is moderate some guarding. No LE edema noted. PG cannula was connected to a syringe and 400 mL of amber colored peritoneal fluid removed. Fluid less hazy than yesterday. Lab data; Peritoneal fluid cultures remained negative. Gram stain had revealed gram-positive cocci. WBC 10.4, H&H 8 and 23.4 and platelet count 89K INR 1.28 Serum albumin is 1.4. Assessment; #1. Acute peritonitis in a patient with history of cirrhotic ascites and renal failure whose been receiving peritoneal dialysis. He is on antibacterial and antifungal therapy and appears to be slowly improving. I was able to remove 400 mL of peritoneal fluid which is not as hazy as yesterday. It has been sent for cell count and differential. PG cannula to be removed in a.m. #2. Anemia. H&H is low but no evidence of active bleeding. If hemoglobin drops any further he will need transfusion. #3. Hepatic encephalopathy clinically improved. #4. 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 INR has corrected with vitamin K implying deficiency rather than DIC. #5.ESRD. Patient to undergo hemodialysis a.m. he may benefit from albumin infusion at the time. Recommendations. Continue current antibiotic therapy until peritoneal fluid cultures completed. Peritoneal fluid sent for cell count and differential. Will ask nursing staff to aspirate more fluid this evening. Check serum ammonia in a.m.

## 2012-10-29 NOTE — Progress Notes (Signed)
Subjective: This man was admitted with abdominal pain and sepsis picture. He has end-stage renal disease and is supposed to be doing peritoneal dialysis but compliance is poor. 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 He has not been deemed to be a candidate for liver or renal transplantation. He was hospitalized just a few days ago and left AMA , despite multiple attempts to convince him to stay in the hospital. This is a pattern that he seems to follow. Today he complains of abdominal pain again. He had 1700 cc of peritoneal fluid drawn off yesterday with symptomatic improvement. He complains of a cough, which is nonproductive. He is due to have removal of peritoneal dialysis catheter tomorrow. He is also due to receive hemodialysis tomorrow. Vitamin K intravenously was given yesterday with good effect.           Physical Exam: Blood pressure 143/60, pulse 98, temperature 99.2 F (37.3 C), temperature source Oral, resp. rate 15, height 6\' 3"  (1.905 m), weight 106.1 kg (233 lb 14.5 oz), SpO2 94.00%. He looks improved this morning. He is more alert. He still has asterixis but less so. His peripheries are warm. Abdomen is soft and  tender, more stronger left than the right. Bowel sounds are heard. Heart sounds are present and in sinus rhythm. He has gynecomastia. There are no obvious focal neurological signs. He does not have peripheral pitting edema in his legs.   Investigations:  Recent Results (from the past 240 hour(s))  MRSA PCR SCREENING     Status: None   Collection Time    10/26/12 10:10 PM      Result Value Range Status   MRSA by PCR NEGATIVE  NEGATIVE Final   Comment:            The GeneXpert MRSA Assay (FDA     approved for NASAL specimens     only), is one component of a     comprehensive MRSA colonization     surveillance program. It is not     intended to diagnose MRSA     infection nor to guide or     monitor treatment for     MRSA  infections.  CULTURE, BLOOD (ROUTINE X 2)     Status: None   Collection Time    10/26/12 10:39 PM      Result Value Range Status   Specimen Description BLOOD RIGHT ARM   Final   Special Requests     Final   Value: BOTTLES DRAWN AEROBIC AND ANAEROBIC AEB 6CC ANA 4CC   Culture NO GROWTH 3 DAYS   Final   Report Status PENDING   Incomplete  CULTURE, BLOOD (ROUTINE X 2)     Status: None   Collection Time    10/26/12 10:44 PM      Result Value Range Status   Specimen Description BLOOD RIGHT ARM   Final   Special Requests     Final   Value: BOTTLES DRAWN AEROBIC AND ANAEROBIC AEB 6CC ANA 5CC   Culture NO GROWTH 3 DAYS   Final   Report Status PENDING   Incomplete  CULTURE, BODY FLUID-BOTTLE     Status: None   Collection Time    10/27/12  1:01 PM      Result Value Range Status   Specimen Description PERITONEAL   Final   Special Requests     Final   Value: BOTTLES DRAWN AEROBIC AND ANAEROBIC 6 CC  EACH BOTTLE   Culture NO GROWTH 2 DAYS   Final   Report Status PENDING   Incomplete  GRAM STAIN     Status: None   Collection Time    10/27/12  1:06 PM      Result Value Range Status   Specimen Description PERITONEAL   Final   Special Requests NONE   Final   Gram Stain ABUNDANT WBC SEEN GRAM POSITIVE COCCI IN PAIRS   Final   Report Status 10/27/2012 FINAL   Final     Basic Metabolic Panel:  Recent Labs  45/40/98 0434 10/29/12 0620  NA 138 133*  K 4.4 4.8  CL 102 98  CO2 29 25  GLUCOSE 158* 152*  BUN 36* 52*  CREATININE 4.70* 6.33*  CALCIUM 8.0* 8.0*  PHOS 5.1*  --    Liver Function Tests:  Recent Labs  10/27/12 0446 10/29/12 0620  AST 27 42*  ALT 25 25  ALKPHOS 114 111  BILITOT 0.6 0.7  PROT 5.2* 5.3*  ALBUMIN 1.6* 1.4*     CBC:  Recent Labs  10/26/12 1634  10/28/12 1300 10/29/12 0620  WBC 17.3*  < > 13.9* 10.4  NEUTROABS 15.5*  --   --   --   HGB 11.4*  < > 9.0* 8.0*  HCT 33.8*  < > 25.1* 23.4*  MCV 93.1  < > 94.7 94.0  PLT 149*  < > 93* 89*  < > =  values in this interval not displayed.  No results found.    Medications: I have reviewed the patient's current medications.  Impression: 1. Bacterial peritonitis, gram-positive cocci in pairs. Await full identification. 2. 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 Not a candidate for liver transplantation. INR has improved to 1.28 after given vitamin K intravenously. 3. End stage renal disease on peritoneal dialysis but compliance is a big issue. Not a candidate for renal transplantation. 4. Altered mental status/drowsiness secondary to #1, #2 and #3. Improving. 5. Anemia, hemoglobin 8.0 this morning, reduced from yesterday. 6. Poor IV access, status post right femoral vein central line.    Plan: 1. Continue with intravenous vancomycin and Zosyn. 2. Await full identification of organism in peritoneal fluid. 3. Will need to remove peritoneal catheter. This will be done tomorrow by Dr. Lovell Sheehan. 4. Agree with 2 units blood transfusion that has already been ordered by Dr. Lovell Sheehan. 5. Repeat chest x-ray this morning.      LOS: 3 days   Wilson Singer Pager [phone]  10/29/2012, 8:48 AM

## 2012-10-30 ENCOUNTER — Encounter (HOSPITAL_COMMUNITY)
Admission: EM | Disposition: A | Discharge status: Skilled Nursing Facility | Payer: Self-pay | Source: Home / Self Care | Attending: Internal Medicine

## 2012-10-30 ENCOUNTER — Encounter (HOSPITAL_COMMUNITY): Payer: Self-pay | Admitting: *Deleted

## 2012-10-30 ENCOUNTER — Encounter (HOSPITAL_COMMUNITY): Payer: Self-pay | Admitting: Anesthesiology

## 2012-10-30 ENCOUNTER — Inpatient Hospital Stay (HOSPITAL_COMMUNITY): Payer: Medicare Other | Admitting: Anesthesiology

## 2012-10-30 DIAGNOSIS — T8571XA Infection and inflammatory reaction due to peritoneal dialysis catheter, initial encounter: Secondary | ICD-10-CM

## 2012-10-30 LAB — GLUCOSE, CAPILLARY
Glucose-Capillary: 149 mg/dL — ABNORMAL HIGH (ref 70–99)
Glucose-Capillary: 151 mg/dL — ABNORMAL HIGH (ref 70–99)
Glucose-Capillary: 117 mg/dL — ABNORMAL HIGH (ref 70–99)
Glucose-Capillary: 104 mg/dL — ABNORMAL HIGH (ref 70–99)

## 2012-10-30 LAB — CBC
Hemoglobin: 9.1 g/dL — ABNORMAL LOW (ref 13.0–17.0)
MCH: 31.9 pg (ref 26.0–34.0)
MCHC: 34.7 g/dL (ref 30.0–36.0)
Platelets: 76 10*3/uL — ABNORMAL LOW (ref 150–400)
RDW: 16.1 % — ABNORMAL HIGH (ref 11.5–15.5)

## 2012-10-30 LAB — BASIC METABOLIC PANEL
Calcium: 7.7 mg/dL — ABNORMAL LOW (ref 8.4–10.5)
GFR calc Af Amer: 8 mL/min — ABNORMAL LOW (ref >90)
GFR calc non Af Amer: 7 mL/min — ABNORMAL LOW (ref >90)
Glucose, Bld: 150 mg/dL — ABNORMAL HIGH (ref 70–99)
Sodium: 133 mEq/L — ABNORMAL LOW (ref 135–145)

## 2012-10-30 LAB — TYPE AND SCREEN
ABO/RH(D): B NEG
Unit division: 0

## 2012-10-30 LAB — SURGICAL PCR SCREEN
MRSA, PCR: NEGATIVE
Staphylococcus aureus: NEGATIVE

## 2012-10-30 LAB — PROTIME-INR
INR: 1.23 (ref 0.00–1.49)
Prothrombin Time: 15.3 seconds — ABNORMAL HIGH (ref 11.6–15.2)

## 2012-10-30 LAB — PHOSPHORUS: Phosphorus: 7.4 mg/dL — ABNORMAL HIGH (ref 2.3–4.6)

## 2012-10-30 SURGERY — MINOR REMOVAL OF PERITONEAL DIALYSIS CATHETER
Anesthesia: Monitor Anesthesia Care | Site: Abdomen | Wound class: Dirty or Infected

## 2012-10-30 MED ORDER — 0.9 % SODIUM CHLORIDE (POUR BTL) OPTIME
TOPICAL | Status: DC | PRN
  Administered 2012-10-30: 1000 mL

## 2012-10-30 MED ORDER — EPOETIN ALFA 10000 UNIT/ML IJ SOLN
10000.0000 [IU] | INTRAMUSCULAR | Status: DC
  Administered 2012-10-30 – 2012-11-01 (×2): 10000 [IU] via INTRAVENOUS
  Filled 2012-10-30 (×5): qty 1

## 2012-10-30 MED ORDER — PROPOFOL 10 MG/ML IV EMUL
INTRAVENOUS | Status: AC
  Filled 2012-10-30: qty 40

## 2012-10-30 MED ORDER — FENTANYL CITRATE 0.05 MG/ML IJ SOLN: 25.0000 ug | INTRAMUSCULAR | Status: DC | PRN

## 2012-10-30 MED ORDER — ONDANSETRON HCL 4 MG/2ML IJ SOLN: 4.0000 mg | Freq: Once | INTRAMUSCULAR | Status: DC | PRN

## 2012-10-30 MED ORDER — LIDOCAINE HCL (PF) 1 % IJ SOLN
INTRAMUSCULAR | Status: AC
  Filled 2012-10-30: qty 30

## 2012-10-30 MED ORDER — SEVELAMER CARBONATE 800 MG PO TABS
1600.0000 mg | ORAL_TABLET | Freq: Three times a day (TID) | ORAL | Status: DC
  Administered 2012-10-31 – 2012-11-02 (×5): 1600 mg via ORAL
  Filled 2012-10-30 (×5): qty 2

## 2012-10-30 MED ORDER — FLUCONAZOLE IN SODIUM CHLORIDE 200-0.9 MG/100ML-% IV SOLN
INTRAVENOUS | Status: AC
  Filled 2012-10-30: qty 100

## 2012-10-30 MED ORDER — MIDAZOLAM HCL 2 MG/2ML IJ SOLN
1.0000 mg | INTRAMUSCULAR | Status: DC | PRN
  Administered 2012-10-30: 2 mg via INTRAVENOUS

## 2012-10-30 MED ORDER — LIDOCAINE HCL (PF) 1 % IJ SOLN
INTRAMUSCULAR | Status: DC | PRN
  Administered 2012-10-30: 6 mL

## 2012-10-30 MED ORDER — POVIDONE-IODINE 10 % OINT PACKET
TOPICAL_OINTMENT | CUTANEOUS | Status: DC | PRN
  Administered 2012-10-30: 1 via TOPICAL

## 2012-10-30 MED ORDER — LACTULOSE 10 GM/15ML PO SOLN
20.0000 g | Freq: Two times a day (BID) | ORAL | Status: DC
  Administered 2012-10-31 – 2012-11-01 (×3): 20 g via ORAL
  Filled 2012-10-30 (×4): qty 30

## 2012-10-30 MED ORDER — SODIUM CHLORIDE 0.9 % IV SOLN
INTRAVENOUS | Status: DC
  Administered 2012-10-30: 1000 mL via INTRAVENOUS

## 2012-10-30 MED ORDER — PANTOPRAZOLE SODIUM 40 MG PO TBEC
40.0000 mg | DELAYED_RELEASE_TABLET | Freq: Every day | ORAL | Status: DC
  Administered 2012-10-30 – 2012-10-31 (×2): 40 mg via ORAL
  Filled 2012-10-30 (×2): qty 1

## 2012-10-30 MED ORDER — SODIUM CHLORIDE 0.9 % IV SOLN
INTRAVENOUS | Status: DC | PRN
  Administered 2012-10-30: 12:00:00 via INTRAVENOUS

## 2012-10-30 MED ORDER — MIDAZOLAM HCL 2 MG/2ML IJ SOLN
INTRAMUSCULAR | Status: AC
  Filled 2012-10-30: qty 2

## 2012-10-30 MED ORDER — PROPOFOL INFUSION 10 MG/ML OPTIME
INTRAVENOUS | Status: DC | PRN
  Administered 2012-10-30: 100 ug/kg/min via INTRAVENOUS

## 2012-10-30 MED ORDER — POVIDONE-IODINE 10 % EX OINT
TOPICAL_OINTMENT | CUTANEOUS | Status: AC
  Filled 2012-10-30: qty 1

## 2012-10-30 SURGICAL SUPPLY — 23 items
COVER LIGHT HANDLE STERIS (MISCELLANEOUS) ×4 IMPLANT
DECANTER SPIKE VIAL GLASS SM (MISCELLANEOUS) ×2 IMPLANT
ELECT REM PT RETURN 9FT ADLT (ELECTROSURGICAL) ×2
ELECTRODE REM PT RTRN 9FT ADLT (ELECTROSURGICAL) ×1 IMPLANT
GLOVE BIOGEL M STRL SZ7.5 (GLOVE) ×2 IMPLANT
GLOVE BIOGEL PI IND STRL 7.5 (GLOVE) ×1 IMPLANT
GLOVE BIOGEL PI INDICATOR 7.5 (GLOVE) ×1
GLOVE ECLIPSE 7.0 STRL STRAW (GLOVE) ×2 IMPLANT
GLOVE EXAM NITRILE LRG STRL (GLOVE) ×2 IMPLANT
GOWN STRL REIN XL XLG (GOWN DISPOSABLE) ×4 IMPLANT
MANIFOLD NEPTUNE II (INSTRUMENTS) ×2 IMPLANT
PACK ABDOMINAL MAJOR (CUSTOM PROCEDURE TRAY) ×2 IMPLANT
PAD ARMBOARD 7.5X6 YLW CONV (MISCELLANEOUS) ×2 IMPLANT
SOL PREP PROV IODINE SCRUB 4OZ (MISCELLANEOUS) ×2 IMPLANT
SPONGE GAUZE 4X4 12PLY (GAUZE/BANDAGES/DRESSINGS) ×2 IMPLANT
STAPLER VISISTAT (STAPLE) ×2 IMPLANT
SUT ETHIBOND NAB MO 7 #0 18IN (SUTURE) ×2 IMPLANT
SUT VIC AB 3-0 SH 27 (SUTURE) ×1
SUT VIC AB 3-0 SH 27X BRD (SUTURE) ×1 IMPLANT
SUT VIC AB 4-0 SH 27 (SUTURE) ×1
SUT VIC AB 4-0 SH 27XBRD (SUTURE) ×1 IMPLANT
SWAB CULTURE LIQ STUART DBL (MISCELLANEOUS) ×2 IMPLANT
TAPE CLOTH SURG 4X10 WHT LF (GAUZE/BANDAGES/DRESSINGS) ×2 IMPLANT

## 2012-10-30 NOTE — Preoperative (Signed)
Beta Blockers   Reason not to administer Beta Blockers:Not Applicable 

## 2012-10-30 NOTE — Transfer of Care (Signed)
Immediate Anesthesia Transfer of Care Note  Patient: Timothy Chan  Procedure(s) Performed: Procedure(s):  REMOVAL OF PERITONEAL DIALYSIS CATHETER (N/A)  Patient Location: PACU  Anesthesia Type:MAC  Level of Consciousness: awake and alert   Airway & Oxygen Therapy: Patient Spontanous Breathing and Patient connected to face mask oxygen  Post-op Assessment: Report given to PACU RN and Post -op Vital signs reviewed and stable  Post vital signs: Reviewed and stable  Complications: No apparent anesthesia complications

## 2012-10-30 NOTE — Op Note (Signed)
Patient:  Timothy Chan  DOB:  12-Dec-1950  MRN:  [id number]   Preop Diagnosis:  Peritonitis of unknown etiology  Postop Diagnosis:  Same  Procedure:  Peritoneal dialysis catheter removal  Surgeon:  Franky Macho, M.D.  Anes:  MAC  Indications:  Patient is a 62 year old white male who has had significant abdominal pain and leukocytosis of unknown etiology. He had a peritoneal dialysis catheter placed approximately 3-4 months ago in [facility] and now presents for removal of the peritoneal dialysis catheter. The risks and benefits of the procedure were fully explained to the patient, who gave informed consent.  Procedure note:  The patient is placed the supine position. Monitored anesthesia care was given. 1% Xylocaine was used for local anesthesia. The abdomen was prepped and draped using usual sterile technique with Betadine. Surgical site confirmation was performed.  An incision was made through the previous right periumbilical surgical scar. This was taken down to the peritoneal dialysis catheter cuff. This was freed away from its fascial attachments. The intra-abdominal peritoneal part of the catheter was then removed. The tip was sent for aerobic culture. The remaining portion of the subcutaneous tissue was then removed without difficulty. It was disposed of. It had been cut at skin level to expedite recovery. The wound was irrigated normal saline. The fascial defect was reapproximated using 0 Ethibond interrupted sutures. The subcutaneous layer was reapproximated using 3-0 Vicryl interrupted sutures. The skin was closed using staples. Betadine ointment after dressings were applied.  The patient tolerated procedure well was transferred to PACU in stable condition.   Complications:  None  EBL:  Minimal  Specimen:  Peritoneal dialysis tip for culture

## 2012-10-30 NOTE — Anesthesia Preprocedure Evaluation (Signed)
Anesthesia Evaluation  Patient identified by MRN, date of birth, ID band Patient awake    Reviewed: Allergy & Precautions, H&P , NPO status , Patient's Chart, lab work & pertinent test results  Airway Mallampati: II TM Distance: >3 FB     Dental  (+) Edentulous Upper and Edentulous Lower   Pulmonary neg pulmonary ROS,  breath sounds clear to auscultation        Cardiovascular hypertension, Pt. on medications Rhythm:Regular Rate:Tachycardia     Neuro/Psych  Neuromuscular disease    GI/Hepatic (+) Cirrhosis -  Esophageal Varices and ascites  substance abuse  alcohol use,   Endo/Other  diabetes, Type 2  Renal/GU ESRF and DialysisRenal disease     Musculoskeletal   Abdominal   Peds  Hematology   Anesthesia Other Findings   Reproductive/Obstetrics                           Anesthesia Physical Anesthesia Plan  ASA: IV  Anesthesia Plan: MAC   Post-op Pain Management:    Induction: Intravenous  Airway Management Planned: Simple Face Mask  Additional Equipment:   Intra-op Plan:   Post-operative Plan:   Informed Consent: I have reviewed the patients History and Physical, chart, labs and discussed the procedure including the risks, benefits and alternatives for the proposed anesthesia with the patient or authorized representative who has indicated his/her understanding and acceptance.     Plan Discussed with:   Anesthesia Plan Comments: (Discussed possible GOT anesthesia if needed by surgeon.)        Anesthesia Quick Evaluation

## 2012-10-30 NOTE — Anesthesia Procedure Notes (Signed)
Procedure Name: MAC Date/Time: 10/30/2012 11:51 AM Performed by: Glendora Score A Pre-anesthesia Checklist: Patient identified, Emergency Drugs available, Suction available and Patient being monitored Patient Re-evaluated:Patient Re-evaluated prior to inductionOxygen Delivery Method: Nasal cannula Ventilation: Oral airway inserted - appropriate to patient size Placement Confirmation: positive ETCO2

## 2012-10-30 NOTE — Progress Notes (Signed)
Subjective: Interval History: has no complaint of patient is of his abdominal pain has improved. Presently he denies any difficulty in breathing.   Objective: Vital signs in last 24 hours: Temp:  [97.3 F (36.3 C)-98.4 F (36.9 C)] 97.3 F (36.3 C) (04/21 0500) Pulse Rate:  [73-86] 78 (04/21 0500) Resp:  [14-20] 14 (04/21 0500) BP: (121-152)/(51-76) 145/63 mmHg (04/21 0500) SpO2:  [95 %-96 %] 95 % (04/21 0500) Weight:  [107.3 kg (236 lb 8.9 oz)] 107.3 kg (236 lb 8.9 oz) (04/21 0500) Weight change: 1.2 kg (2 lb 10.3 oz)  Intake/Output from previous day: 04/20 0701 - 04/21 0700 In: 6195.2 [P.O.:480; I.V.:5276; Blood:439.2] Out: 620 [Urine:160] Intake/Output this shift:    General appearance: alert, cooperative and no distress Resp: clear to auscultation bilaterally Cardio: regular rate and rhythm, S1, S2 normal, no murmur, click, rub or gallop GI: Distended but not tender. He has positive bowel sound. Extremities: extremities normal, atraumatic, no cyanosis or edema  Lab Results:  Recent Labs  10/29/12 0620 10/30/12 0449  WBC 10.4 6.2  HGB 8.0* 9.1*  HCT 23.4* 26.2*  PLT 89* 76*   BMET:  Recent Labs  10/29/12 0620 10/30/12 0449  NA 133* 133*  K 4.8 4.9  CL 98 98  CO2 25 23  GLUCOSE 152* 150*  BUN 52* 67*  CREATININE 6.33* 7.58*  CALCIUM 8.0* 7.7*   No results found for this basename: PTH,  in the last 72 hours Iron Studies: No results found for this basename: IRON, TIBC, TRANSFERRIN, FERRITIN,  in the last 72 hours  Studies/Results: Dg Chest Port 1 View  10/29/2012  *RADIOLOGY REPORT*  Clinical Data: Cough, weakness, shortness of breath.  PORTABLE CHEST - 1 VIEW  Comparison: 10/26/2012  Findings: New consolidation in the right lower lobe compatible with pneumonia.  Small right pleural effusions.  Mild cardiomegaly.  No focal opacity on the left.  IMPRESSION: Right lower lobe pneumonia.  Small right effusion.   Original Report Authenticated By: Charlett Nose,  M.D.     I have reviewed the patient's current medications.  Assessment/Plan: Problem #1 end-stage renal disease is status post hemodialysis on Friday BUN 67 creatinine 7.58 potassium is 4.9 stable. Problem #2 anemia his hemoglobin 9.1 hematocrit 26.2 H&H seems to be somewhat better. Patient has received blood transfusion. Problem #3 history of peritonitis presently patient is a febrile and his white blood cell count is 6.2. Patient on antibiotics. Problem #4 diabetes Problem #5 hepatic encephalopathy has improved. Problem #6 history of GI bleeding. Problem #7 metabolic bone disease phosphorus presently high. Plan: For dialysis today Will start patient on Renvella 800 mg 2 tablets po tid with meals and one with snack. We'll check his CBC and basic metabolic panel With Epogen after each dialysis.     LOS: 4 days   Karsten Vaughn S 10/30/2012,8:34 AM

## 2012-10-30 NOTE — Anesthesia Postprocedure Evaluation (Signed)
  Anesthesia Post-op Note  Patient: Timothy Chan  Procedure(s) Performed: Procedure(s):  REMOVAL OF PERITONEAL DIALYSIS CATHETER (N/A)  Patient Location: PACU  Anesthesia Type:MAC  Level of Consciousness: awake and alert   Airway and Oxygen Therapy: Patient Spontanous Breathing and Patient connected to face mask oxygen  Post-op Pain: none  Post-op Assessment: Post-op Vital signs reviewed, Patient's Cardiovascular Status Stable, Respiratory Function Stable, Patent Airway and No signs of Nausea or vomiting  Post-op Vital Signs: Reviewed and stable  Complications: No apparent anesthesia complications

## 2012-10-30 NOTE — Procedures (Signed)
  HEMODIALYSIS TREATMENT NOTE:  4 hour HD treatment completed through left forearm AVF (15g needles /antegrade placement) s/p PD cath removal.  GOAL MET: tolerated removal of 3.3 liters with no interruption in ultrafiltration.  Discussed NS @50  with Dr. Lendell Caprice; decreased to [facility] per order.  Old PD site dressing clean, dry, intact.  Pt without c/o abdominal pain post HD.  All blood was reinfused and hemostasis was achieved within 15 minutes.  Report given to [place], [place]

## 2012-10-30 NOTE — Progress Notes (Signed)
ANTIBIOTIC CONSULT NOTE   Pharmacy Consult for Vancomycin and Zosyn Indication: suspected bacterial peritonitis/empyema  Allergies  Allergen Reactions  . Tylenol (Acetaminophen) Other (See Comments)    cirrhosis  . Morphine And Related Itching   Patient Measurements: Height: 6\' 3"  (190.5 cm) Weight: 236 lb 8.9 oz (107.3 kg) IBW/kg (Calculated) : 84.5 Adjusted Body Weight: 92Kg  Vital Signs: Temp: 97.3 F (36.3 C) (04/21 0500) Temp src: Oral (04/21 0500) BP: 145/63 mmHg (04/21 0500) Pulse Rate: 78 (04/21 0500) Intake/Output from previous day: 04/20 0701 - 04/21 0700 In: 6195.2 [P.O.:480; I.V.:5276; Blood:439.2] Out: 620 [Urine:160] Intake/Output from this shift:    Labs:  Recent Labs  10/28/12 0434 10/28/12 1300 10/29/12 0620 10/30/12 0449  WBC 16.5* 13.9* 10.4 6.2  HGB 8.1* 9.0* 8.0* 9.1*  PLT 90* 93* 89* 76*  CREATININE 4.70*  --  6.33* 7.58*   Estimated Creatinine Clearance: 13.5 ml/min (by C-G formula based on Cr of 7.58). No results found for this basename: VANCOTROUGH, Leodis Binet, VANCORANDOM, GENTTROUGH, GENTPEAK, GENTRANDOM, TOBRATROUGH, TOBRAPEAK, TOBRARND, AMIKACINPEAK, AMIKACINTROU, AMIKACIN,  in the last 72 hours   Microbiology: Recent Results (from the past 720 hour(s))  MRSA PCR SCREENING     Status: None   Collection Time    10/26/12 10:10 PM      Result Value Range Status   MRSA by PCR NEGATIVE  NEGATIVE Final   Comment:            The GeneXpert MRSA Assay (FDA     approved for NASAL specimens     only), is one component of a     comprehensive MRSA colonization     surveillance program. It is not     intended to diagnose MRSA     infection nor to guide or     monitor treatment for     MRSA infections.  CULTURE, BLOOD (ROUTINE X 2)     Status: None   Collection Time    10/26/12 10:39 PM      Result Value Range Status   Specimen Description BLOOD RIGHT ARM   Final   Special Requests     Final   Value: BOTTLES DRAWN AEROBIC AND ANAEROBIC  AEB 6CC ANA 4CC   Culture NO GROWTH 3 DAYS   Final   Report Status PENDING   Incomplete  CULTURE, BLOOD (ROUTINE X 2)     Status: None   Collection Time    10/26/12 10:44 PM      Result Value Range Status   Specimen Description BLOOD RIGHT ARM   Final   Special Requests     Final   Value: BOTTLES DRAWN AEROBIC AND ANAEROBIC AEB 6CC ANA 5CC   Culture NO GROWTH 3 DAYS   Final   Report Status PENDING   Incomplete  CULTURE, BODY FLUID-BOTTLE     Status: None   Collection Time    10/27/12  1:01 PM      Result Value Range Status   Specimen Description PERITONEAL   Final   Special Requests     Final   Value: BOTTLES DRAWN AEROBIC AND ANAEROBIC 6 CC EACH BOTTLE   Culture NO GROWTH 2 DAYS   Final   Report Status PENDING   Incomplete  GRAM STAIN     Status: None   Collection Time    10/27/12  1:06 PM      Result Value Range Status   Specimen Description PERITONEAL   Final   Special Requests NONE  Final   Gram Stain ABUNDANT WBC SEEN GRAM POSITIVE COCCI IN PAIRS   Final   Report Status 10/27/2012 FINAL   Final  URINE CULTURE     Status: None   Collection Time    10/28/12  1:17 AM      Result Value Range Status   Specimen Description URINE, CLEAN CATCH   Final   Special Requests zosyn, vancomycin   Final   Culture  Setup Time 10/28/2012 21:21   Final   Colony Count NO GROWTH   Final   Culture NO GROWTH   Final   Report Status 10/29/2012 FINAL   Final  BODY FLUID CULTURE     Status: None   Collection Time    10/29/12  1:01 PM      Result Value Range Status   Specimen Description PERITONEAL CAVITY   Final   Special Requests NONE   Final   Gram Stain     Final   Value: WBC PRESENT,BOTH PMN AND MONONUCLEAR     NO ORGANISMS SEEN     Performed at [facility]   Culture PENDING   Incomplete   Report Status PENDING   Incomplete  SURGICAL PCR SCREEN     Status: None   Collection Time    10/30/12  3:06 AM      Result Value Range Status   MRSA, PCR NEGATIVE  NEGATIVE Final    Staphylococcus aureus NEGATIVE  NEGATIVE Final   Comment:            The Xpert SA Assay (FDA     approved for NASAL specimens     in patients over 46 years of age),     is one component of     a comprehensive surveillance     program.  Test performance has     been validated by [employer] for patients greater     than or equal to 29 year old.     It is not intended     to diagnose infection nor to     guide or monitor treatment.   Medical History: Past Medical History  Diagnosis Date  . Rectal bleeding   . Dialysis care   . GI bleed   . Hypertension   . Esophageal varices   . Alcoholic liver disease   . Hepatic encephalopathy   . Ascites   . Diabetes mellitus   . Detached retina   . Renal disorder    Medications:  Scheduled:  . [COMPLETED] chlorhexidine  1 application Topical Once  . fluconazole (DIFLUCAN) IV  200 mg Intravenous Q48H  . insulin aspart  0-9 Units Subcutaneous Q4H  . metoprolol succinate  25 mg Oral Daily  . pantoprazole (PROTONIX) IV  40 mg Intravenous Q12H  . [COMPLETED] phytonadione (VITAMIN K) IV  5 mg Intravenous BID AC  . piperacillin-tazobactam (ZOSYN)  IV  2.25 g Intravenous Q8H  . rifaximin  275 mg Oral BID  . sodium chloride  10-40 mL Intracatheter Q12H  . sodium chloride  3 mL Intravenous Q12H  . vancomycin  1,000 mg Intravenous Q M,W,F-HD   Assessment: 61yo obese male with ESRD and reportedly getting peritoneal dialysis PTA but poorly compliant.  Pt is now scheduled for hemodialysis every MWF.    Estimated Creatinine Clearance: 13.5 ml/min (by C-G formula based on Cr of 7.58).    Pt is afebrile with normal WBC.  Goal of Therapy:  Pre-dialysis vancomycin level  15-25  Plan: Zosyn 2.25gm IV q8hrs (renally adjusted) Vancomycin 1gm IV after each dialysis Check pre-dialysis level at steady state Monitor labs, renal fxn, and cultures Duration of therapy per MD  Valrie Hart A 10/30/2012,7:57 AM

## 2012-10-30 NOTE — Progress Notes (Signed)
Chart reviewed.  Subjective: No complaints. Unsure whether he would agree to skilled nursing facility placement.  Objective: Vital signs in last 24 hours: Filed Vitals:   10/30/12 1534 10/30/12 1540 10/30/12 1600 10/30/12 1630  BP: 140/65 142/71 144/67 119/71  Pulse: 73 71 69 84  Temp:      TempSrc:      Resp:      Height:      Weight:      SpO2:       Weight change: 1.2 kg (2 lb 10.3 oz)  Intake/Output Summary (Last 24 hours) at 10/30/12 1642 Last data filed at 10/30/12 1223  Gross per 24 hour  Intake 6105.16 ml  Output    270 ml  Net 5835.16 ml   General: Alert. Slightly groggy. Oriented and appropriate. Lungs clear to auscultation bilaterally without wheezes rhonchi or rales Cardiovascular regular rate rhythm without murmurs gallops rubs Abdomen normal bowel sounds, soft, nontender. Dressing clean dry and intact in the lower abdomen Extremities no clubbing cyanosis or edema  Lab Results: Basic Metabolic Panel:  Recent Labs Lab 10/28/12 0434 10/29/12 0620 10/30/12 0449  NA 138 133* 133*  K 4.4 4.8 4.9  CL 102 98 98  CO2 29 25 23   GLUCOSE 158* 152* 150*  BUN 36* 52* 67*  CREATININE 4.70* 6.33* 7.58*  CALCIUM 8.0* 8.0* 7.7*  PHOS 5.1*  --  7.4*   Liver Function Tests:  Recent Labs Lab 10/27/12 0446 10/29/12 0620  AST 27 42*  ALT 25 25  ALKPHOS 114 111  BILITOT 0.6 0.7  PROT 5.2* 5.3*  ALBUMIN 1.6* 1.4*   No results found for this basename: LIPASE, AMYLASE,  in the last 168 hours  Recent Labs Lab 10/26/12 1825 10/27/12 1000  AMMONIA 119* 90*   CBC:  Recent Labs Lab 10/26/12 1634  10/29/12 0620 10/30/12 0449  WBC 17.3*  < > 10.4 6.2  NEUTROABS 15.5*  --   --   --   HGB 11.4*  < > 8.0* 9.1*  HCT 33.8*  < > 23.4* 26.2*  MCV 93.1  < > 94.0 91.9  PLT 149*  < > 89* 76*  < > = values in this interval not displayed. Cardiac Enzymes:  Recent Labs Lab 10/26/12 1634  TROPONINI <0.30   BNP: No results found for this basename: PROBNP,   in the last 168 hours D-Dimer: No results found for this basename: DDIMER,  in the last 168 hours CBG:  Recent Labs Lab 10/30/12 0037 10/30/12 0406 10/30/12 0716 10/30/12 1034 10/30/12 1242 10/30/12 1615  GLUCAP 149* 151* 117* 116* 104* 90   Hemoglobin A1C: No results found for this basename: HGBA1C,  in the last 168 hours Fasting Lipid Panel: No results found for this basename: CHOL, HDL, LDLCALC, TRIG, CHOLHDL, LDLDIRECT,  in the last 168 hours Thyroid Function Tests: No results found for this basename: TSH, T4TOTAL, FREET4, T3FREE, THYROIDAB,  in the last 168 hours Coagulation:  Recent Labs Lab 10/27/12 1000 10/28/12 0434 10/29/12 0620 10/30/12 0449  LABPROT 17.3* 27.2* 15.7* 15.3*  INR 1.46 2.68* 1.28 1.23   Anemia Panel: No results found for this basename: VITAMINB12, FOLATE, FERRITIN, TIBC, IRON, RETICCTPCT,  in the last 168 hours Urine Drug Screen: Drugs of Abuse  No results found for this basename: labopia, cocainscrnur, labbenz, amphetmu, thcu, labbarb    Alcohol Level: No results found for this basename: ETH,  in the last 168 hours Urinalysis:  Recent Labs Lab 10/28/12 0111  COLORURINE YELLOW  LABSPEC 1.015  PHURINE 6.0  GLUCOSEU 250*  HGBUR MODERATE*  BILIRUBINUR NEGATIVE  KETONESUR TRACE*  PROTEINUR 30*  UROBILINOGEN 0.2  NITRITE NEGATIVE  LEUKOCYTESUR NEGATIVE   Micro Results: Recent Results (from the past 240 hour(s))  MRSA PCR SCREENING     Status: None   Collection Time    10/26/12 10:10 PM      Result Value Range Status   MRSA by PCR NEGATIVE  NEGATIVE Final   Comment:            The GeneXpert MRSA Assay (FDA     approved for NASAL specimens     only), is one component of a     comprehensive MRSA colonization     surveillance program. It is not     intended to diagnose MRSA     infection nor to guide or     monitor treatment for     MRSA infections.  CULTURE, BLOOD (ROUTINE X 2)     Status: None   Collection Time    10/26/12  10:39 PM      Result Value Range Status   Specimen Description BLOOD RIGHT ARM   Final   Special Requests     Final   Value: BOTTLES DRAWN AEROBIC AND ANAEROBIC AEB 6CC ANA 4CC   Culture NO GROWTH 3 DAYS   Final   Report Status PENDING   Incomplete  CULTURE, BLOOD (ROUTINE X 2)     Status: None   Collection Time    10/26/12 10:44 PM      Result Value Range Status   Specimen Description BLOOD RIGHT ARM   Final   Special Requests     Final   Value: BOTTLES DRAWN AEROBIC AND ANAEROBIC AEB 6CC ANA 5CC   Culture NO GROWTH 3 DAYS   Final   Report Status PENDING   Incomplete  CULTURE, BODY FLUID-BOTTLE     Status: None   Collection Time    10/27/12  1:01 PM      Result Value Range Status   Specimen Description PERITONEAL   Final   Special Requests     Final   Value: BOTTLES DRAWN AEROBIC AND ANAEROBIC 6 CC EACH BOTTLE   Culture NO GROWTH 2 DAYS   Final   Report Status PENDING   Incomplete  GRAM STAIN     Status: None   Collection Time    10/27/12  1:06 PM      Result Value Range Status   Specimen Description PERITONEAL   Final   Special Requests NONE   Final   Gram Stain ABUNDANT WBC SEEN GRAM POSITIVE COCCI IN PAIRS   Final   Report Status 10/27/2012 FINAL   Final  URINE CULTURE     Status: None   Collection Time    10/28/12  1:17 AM      Result Value Range Status   Specimen Description URINE, CLEAN CATCH   Final   Special Requests zosyn, vancomycin   Final   Culture  Setup Time 10/28/2012 21:21   Final   Colony Count NO GROWTH   Final   Culture NO GROWTH   Final   Report Status 10/29/2012 FINAL   Final  BODY FLUID CULTURE     Status: None   Collection Time    10/29/12  1:01 PM      Result Value Range Status   Specimen Description PERITONEAL CAVITY   Final   Special Requests NONE   Final  Gram Stain     Final   Value: WBC PRESENT,BOTH PMN AND MONONUCLEAR     NO ORGANISMS SEEN     Performed at [facility]   Culture NO GROWTH 1 DAY   Final   Report Status  PENDING   Incomplete  SURGICAL PCR SCREEN     Status: None   Collection Time    10/30/12  3:06 AM      Result Value Range Status   MRSA, PCR NEGATIVE  NEGATIVE Final   Staphylococcus aureus NEGATIVE  NEGATIVE Final   Comment:            The Xpert SA Assay (FDA     approved for NASAL specimens     in patients over 52 years of age),     is one component of     a comprehensive surveillance     program.  Test performance has     been validated by [employer] for patients greater     than or equal to 71 year old.     It is not intended     to diagnose infection nor to     guide or monitor treatment.   Studies/Results: Dg Chest Port 1 View  10/29/2012  *RADIOLOGY REPORT*  Clinical Data: Cough, weakness, shortness of breath.  PORTABLE CHEST - 1 VIEW  Comparison: 10/26/2012  Findings: New consolidation in the right lower lobe compatible with pneumonia.  Small right pleural effusions.  Mild cardiomegaly.  No focal opacity on the left.  IMPRESSION: Right lower lobe pneumonia.  Small right effusion.   Original Report Authenticated By: Charlett Nose, M.D.    Scheduled Meds: . epoetin alfa  10,000 Units Intravenous 3 times weekly  . fluconazole (DIFLUCAN) IV  200 mg Intravenous Q48H  . insulin aspart  0-9 Units Subcutaneous Q4H  . metoprolol succinate  25 mg Oral Daily  . pantoprazole (PROTONIX) IV  40 mg Intravenous Q12H  . piperacillin-tazobactam (ZOSYN)  IV  2.25 g Intravenous Q8H  . rifaximin  275 mg Oral BID  . sevelamer carbonate  1,600 mg Oral TID WC  . sodium chloride  10-40 mL Intracatheter Q12H  . sodium chloride  3 mL Intravenous Q12H  . vancomycin  1,000 mg Intravenous Q M,W,F-HD   Continuous Infusions: . sodium chloride 50 mL/hr at 10/28/12 1700   PRN Meds:.sodium chloride, sodium chloride, sodium chloride, sodium chloride, feeding supplement (NEPRO CARB STEADY), fentaNYL, guaiFENesin, heparin, lactulose, lidocaine (PF), lidocaine-prilocaine, ondansetron (ZOFRAN) IV,  pentafluoroprop-tetrafluoroeth, promethazine, sodium chloride, traMADol Assessment/Plan: Principal Problem:   Peritonitis associated with peritoneal dialysis Active Problems:   Alcoholic cirrhosis   Hepatic encephalopathy   Diabetes mellitus   ESRD on peritoneal dialysis   Hyperammonemia   SIRS (systemic inflammatory response syndrome)   Abdominal pain  Still has a femoral central line. Will place a PICC line and discontinue femoral catheter. Increase activity after femoral catheter out. PT consult. May need skilled nursing facility placement. Continue current antibiotics and antifungals. White blood cell count is down and appears less encephalopathic. Change proton pump inhibitor to by mouth. Discussed with Dr. Dionicia Abler. May need repeat paracentesis (if enough fluid) post discharge.   LOS: 4 days   Timothy Chan L 10/30/2012, 4:42 PM

## 2012-10-30 NOTE — Progress Notes (Signed)
Patient ID: Timothy Chan, male   DOB: 10/08/1950, 62 y.o.   MRN: [id number] States he feels better. Clear productive cough. Says he feels 60% better. No fever.  He has not had a BM in 2 days.  Ammonia coming down.  Filed Vitals:   10/30/12 1232 10/30/12 1245 10/30/12 1300 10/30/12 1315  BP: 128/62 143/67 145/70 150/69  Pulse: 65 72 73 70  Temp: 97.6 F (36.4 C)   97.6 F (36.4 C)  TempSrc:    Oral  Resp: 14 12 10 10   Height:      Weight:      SpO2: 100% 100% 100% 97%  Will continue to monitor.

## 2012-10-31 ENCOUNTER — Encounter (HOSPITAL_COMMUNITY): Payer: Medicare Other

## 2012-10-31 DIAGNOSIS — D62 Acute posthemorrhagic anemia: Secondary | ICD-10-CM

## 2012-10-31 DIAGNOSIS — D696 Thrombocytopenia, unspecified: Secondary | ICD-10-CM | POA: Diagnosis not present

## 2012-10-31 LAB — CBC
HCT: 26.1 % — ABNORMAL LOW (ref 39.0–52.0)
MCV: 92.6 fL (ref 78.0–100.0)
RBC: 2.82 MIL/uL — ABNORMAL LOW (ref 4.22–5.81)
WBC: 5.1 10*3/uL (ref 4.0–10.5)

## 2012-10-31 LAB — GLUCOSE, CAPILLARY
Glucose-Capillary: 143 mg/dL — ABNORMAL HIGH (ref 70–99)
Glucose-Capillary: 158 mg/dL — ABNORMAL HIGH (ref 70–99)

## 2012-10-31 LAB — BASIC METABOLIC PANEL
BUN: 37 mg/dL — ABNORMAL HIGH (ref 6–23)
CO2: 26 mEq/L (ref 19–32)
Chloride: 99 mEq/L (ref 96–112)
Creatinine, Ser: 5.12 mg/dL — ABNORMAL HIGH (ref 0.50–1.35)

## 2012-10-31 LAB — PATHOLOGIST SMEAR REVIEW: Path Review: INCREASED

## 2012-10-31 MED ORDER — BENZONATATE 100 MG PO CAPS
200.0000 mg | ORAL_CAPSULE | Freq: Three times a day (TID) | ORAL | Status: DC | PRN
  Administered 2012-10-31 – 2012-11-02 (×4): 200 mg via ORAL
  Filled 2012-10-31 (×4): qty 2

## 2012-10-31 MED ORDER — FLUCONAZOLE IN SODIUM CHLORIDE 200-0.9 MG/100ML-% IV SOLN
200.0000 mg | INTRAVENOUS | Status: DC
  Filled 2012-10-31: qty 100

## 2012-10-31 MED ORDER — NEPRO/CARBSTEADY PO LIQD: 120.0000 mL | Freq: Two times a day (BID) | ORAL | Status: DC

## 2012-10-31 MED ORDER — GUAIFENESIN ER 600 MG PO TB12
600.0000 mg | ORAL_TABLET | Freq: Two times a day (BID) | ORAL | Status: DC | PRN
  Administered 2012-10-31 – 2012-11-02 (×4): 600 mg via ORAL
  Filled 2012-10-31 (×5): qty 1

## 2012-10-31 NOTE — Progress Notes (Signed)
Subjective: Still weak.  No other complaints  Objective: Vital signs in last 24 hours: Filed Vitals:   10/30/12 2207 10/31/12 0508 10/31/12 1054 10/31/12 1423  BP: 125/75 105/59 158/76 136/61  Pulse: 92 77 84 77  Temp: 98.1 F (36.7 C) 98 F (36.7 C) 98.2 F (36.8 C) 97.9 F (36.6 C)  TempSrc:   Oral Oral  Resp: 16 16 24 20   Height:      Weight:  106.7 kg (235 lb 3.7 oz)    SpO2: 97% 95% 94% 97%   Weight change: -0.251 kg (-8.9 oz)  Intake/Output Summary (Last 24 hours) at 10/31/12 1949 Last data filed at 10/31/12 1700  Gross per 24 hour  Intake    560 ml  Output   3300 ml  Net  -2740 ml   General: Alert. Oriented and appropriate. Lungs clear to auscultation bilaterally without wheezes rhonchi or rales Cardiovascular regular rate rhythm without murmurs gallops rubs Abdomen normal bowel sounds, soft, nontender. Dressing clean dry and intact in the lower abdomen Extremities no clubbing cyanosis or edema  Lab Results: Basic Metabolic Panel:  Recent Labs Lab 10/28/12 0434  10/30/12 0449 10/31/12 0522  NA 138  < > 133* 136  K 4.4  < > 4.9 4.6  CL 102  < > 98 99  CO2 29  < > 23 26  GLUCOSE 158*  < > 150* 176*  BUN 36*  < > 67* 37*  CREATININE 4.70*  < > 7.58* 5.12*  CALCIUM 8.0*  < > 7.7* 7.3*  PHOS 5.1*  --  7.4*  --   < > = values in this interval not displayed. Liver Function Tests:  Recent Labs Lab 10/27/12 0446 10/29/12 0620  AST 27 42*  ALT 25 25  ALKPHOS 114 111  BILITOT 0.6 0.7  PROT 5.2* 5.3*  ALBUMIN 1.6* 1.4*   No results found for this basename: LIPASE, AMYLASE,  in the last 168 hours  Recent Labs Lab 10/26/12 1825 10/27/12 1000  AMMONIA 119* 90*   CBC:  Recent Labs Lab 10/26/12 1634  10/30/12 0449 10/31/12 0522  WBC 17.3*  < > 6.2 5.1  NEUTROABS 15.5*  --   --   --   HGB 11.4*  < > 9.1* 9.0*  HCT 33.8*  < > 26.2* 26.1*  MCV 93.1  < > 91.9 92.6  PLT 149*  < > 76* 60*  < > = values in this interval not displayed. Cardiac  Enzymes:  Recent Labs Lab 10/26/12 1634  TROPONINI <0.30   BNP: No results found for this basename: PROBNP,  in the last 168 hours D-Dimer: No results found for this basename: DDIMER,  in the last 168 hours CBG:  Recent Labs Lab 10/30/12 1615 10/30/12 2010 10/31/12 0106 10/31/12 0344 10/31/12 1052 10/31/12 1658  GLUCAP 90 103* 180* 143* 226* 144*   Hemoglobin A1C: No results found for this basename: HGBA1C,  in the last 168 hours Fasting Lipid Panel: No results found for this basename: CHOL, HDL, LDLCALC, TRIG, CHOLHDL, LDLDIRECT,  in the last 168 hours Thyroid Function Tests: No results found for this basename: TSH, T4TOTAL, FREET4, T3FREE, THYROIDAB,  in the last 168 hours Coagulation:  Recent Labs Lab 10/28/12 0434 10/29/12 0620 10/30/12 0449 10/31/12 0522  LABPROT 27.2* 15.7* 15.3* 15.2  INR 2.68* 1.28 1.23 1.22   Anemia Panel: No results found for this basename: VITAMINB12, FOLATE, FERRITIN, TIBC, IRON, RETICCTPCT,  in the last 168 hours Urine Drug Screen: Drugs  of Abuse  No results found for this basename: labopia,  cocainscrnur,  labbenz,  amphetmu,  thcu,  labbarb    Alcohol Level: No results found for this basename: ETH,  in the last 168 hours Urinalysis:  Recent Labs Lab 10/28/12 0111  COLORURINE YELLOW  LABSPEC 1.015  PHURINE 6.0  GLUCOSEU 250*  HGBUR MODERATE*  BILIRUBINUR NEGATIVE  KETONESUR TRACE*  PROTEINUR 30*  UROBILINOGEN 0.2  NITRITE NEGATIVE  LEUKOCYTESUR NEGATIVE   Micro Results: Recent Results (from the past 240 hour(s))  MRSA PCR SCREENING     Status: None   Collection Time    10/26/12 10:10 PM      Result Value Range Status   MRSA by PCR NEGATIVE  NEGATIVE Final   Comment:            The GeneXpert MRSA Assay (FDA     approved for NASAL specimens     only), is one component of a     comprehensive MRSA colonization     surveillance program. It is not     intended to diagnose MRSA     infection nor to guide or      monitor treatment for     MRSA infections.  CULTURE, BLOOD (ROUTINE X 2)     Status: None   Collection Time    10/26/12 10:39 PM      Result Value Range Status   Specimen Description BLOOD RIGHT ARM   Final   Special Requests     Final   Value: BOTTLES DRAWN AEROBIC AND ANAEROBIC AEB 6CC ANA 4CC   Culture NO GROWTH 3 DAYS   Final   Report Status PENDING   Incomplete  CULTURE, BLOOD (ROUTINE X 2)     Status: None   Collection Time    10/26/12 10:44 PM      Result Value Range Status   Specimen Description BLOOD RIGHT ARM   Final   Special Requests     Final   Value: BOTTLES DRAWN AEROBIC AND ANAEROBIC AEB 6CC ANA 5CC   Culture NO GROWTH 3 DAYS   Final   Report Status PENDING   Incomplete  CULTURE, BODY FLUID-BOTTLE     Status: None   Collection Time    10/27/12  1:01 PM      Result Value Range Status   Specimen Description PERITONEAL   Final   Special Requests     Final   Value: BOTTLES DRAWN AEROBIC AND ANAEROBIC 6 CC EACH BOTTLE   Culture NO GROWTH 2 DAYS   Final   Report Status PENDING   Incomplete  GRAM STAIN     Status: None   Collection Time    10/27/12  1:06 PM      Result Value Range Status   Specimen Description PERITONEAL   Final   Special Requests NONE   Final   Gram Stain ABUNDANT WBC SEEN GRAM POSITIVE COCCI IN PAIRS   Final   Report Status 10/27/2012 FINAL   Final  URINE CULTURE     Status: None   Collection Time    10/28/12  1:17 AM      Result Value Range Status   Specimen Description URINE, CLEAN CATCH   Final   Special Requests zosyn, vancomycin   Final   Culture  Setup Time 10/28/2012 21:21   Final   Colony Count NO GROWTH   Final   Culture NO GROWTH   Final   Report Status 10/29/2012 FINAL  Final  BODY FLUID CULTURE     Status: None   Collection Time    10/29/12  1:01 PM      Result Value Range Status   Specimen Description PERITONEAL CAVITY   Final   Special Requests NONE   Final   Gram Stain     Final   Value: WBC PRESENT,BOTH PMN AND  MONONUCLEAR     NO ORGANISMS SEEN     Performed at [facility]   Culture NO GROWTH 2 DAYS   Final   Report Status PENDING   Incomplete  SURGICAL PCR SCREEN     Status: None   Collection Time    10/30/12  3:06 AM      Result Value Range Status   MRSA, PCR NEGATIVE  NEGATIVE Final   Staphylococcus aureus NEGATIVE  NEGATIVE Final   Comment:            The Xpert SA Assay (FDA     approved for NASAL specimens     in patients over 90 years of age),     is one component of     a comprehensive surveillance     program.  Test performance has     been validated by [employer] for patients greater     than or equal to 28 year old.     It is not intended     to diagnose infection nor to     guide or monitor treatment.  CATH TIP CULTURE     Status: None   Collection Time    10/30/12 12:05 PM      Result Value Range Status   Specimen Description PERITONEAL CATH TIP   Final   Special Requests NONE   Final   Culture NO GROWTH 1 DAY   Final   Report Status PENDING   Incomplete   Studies/Results: No results found. Scheduled Meds: . epoetin alfa  10,000 Units Intravenous 3 times weekly  . [START ON 11/01/2012] feeding supplement (NEPRO CARB STEADY)  120 mL Oral BID BM  . [START ON 11/01/2012] fluconazole (DIFLUCAN) IV  200 mg Intravenous Q M,W,F-HD  . insulin aspart  0-9 Units Subcutaneous Q4H  . lactulose  20 g Oral BID  . metoprolol succinate  25 mg Oral Daily  . pantoprazole  40 mg Oral Daily  . piperacillin-tazobactam (ZOSYN)  IV  2.25 g Intravenous Q8H  . rifaximin  275 mg Oral BID  . sevelamer carbonate  1,600 mg Oral TID WC  . sodium chloride  10-40 mL Intracatheter Q12H  . sodium chloride  3 mL Intravenous Q12H  . vancomycin  1,000 mg Intravenous Q M,W,F-HD   Continuous Infusions: . sodium chloride 10 mL/hr at 10/30/12 1645   PRN Meds:.sodium chloride, sodium chloride, sodium chloride, sodium chloride, feeding supplement (NEPRO CARB STEADY), fentaNYL, guaiFENesin,  guaiFENesin, heparin, lidocaine (PF), lidocaine-prilocaine, ondansetron (ZOFRAN) IV, pentafluoroprop-tetrafluoroeth, promethazine, sodium chloride, traMADol Assessment/Plan: Principal Problem:   Peritonitis associated with peritoneal dialysis Active Problems:   Alcoholic cirrhosis   Hepatic encephalopathy   Diabetes mellitus   ESRD on peritoneal dialysis   Hyperammonemia   SIRS (systemic inflammatory response syndrome)   Abdominal pain Worsening thrombocytopenia:  HIT panel ordered, but Dr. Kristian Covey reports ho heparin has been given. Could be infection v. Med related v. Liver ds.  Will stop protonix.  Last day iv abx today.  No bleeding.  Monitor.  Dr. Kristian Covey recommends against PICC.  Unable to get  PIV.  Continue femoral cath until transitioned to PO abx.  Await PT eval   LOS: 5 days   Shaterra Sanzone L 10/31/2012, 7:49 PM

## 2012-10-31 NOTE — Progress Notes (Addendum)
Nutrition Follow-up   INTERVENTION:  Nepro 120 ml BID  NUTRITION DIAGNOSIS:  Inadequate oral intake;ongoing  Goal: Pt to meet >/= 90% of their estimated nutrition needs; not met  Monitor:  Po intake, labs and wt trends, skin assessments   62 y.o. male  Admitting Dx: Peritonitis associated with peritoneal dialysis  ASSESSMENT: Pt tolerating Renal diet with 50-75% recent intake. Peritoneal dialysis cath removed 10/30/12. Now receives hemodialysis. Hx of non-compliance with tx. He has increased energy and protein needs. Will add Nepro BID to support improved nutrition intake.  Height: Ht Readings from Last 1 Encounters:  10/30/12 6\' 3"  (1.905 m)    Weight: Wt Readings from Last 1 Encounters:  10/31/12 235 lb 3.7 oz (106.7 kg)    Ideal Body Weight: 196# (89 kg)  % Ideal Body Weight: 117%  Wt Readings from Last 10 Encounters:  10/31/12 235 lb 3.7 oz (106.7 kg)  10/31/12 235 lb 3.7 oz (106.7 kg)  10/24/12 226 lb (102.513 kg)  10/24/12 226 lb (102.513 kg)  09/18/12 226 lb (102.513 kg)  08/08/12 229 lb 1.6 oz (103.919 kg)  05/26/12 208 lb (94.348 kg)  04/10/12 229 lb 6.4 oz (104.055 kg)  03/26/11 224 lb (101.606 kg)  03/26/11 224 lb (101.606 kg)    Usual Body Weight: 225-230#  % Usual Body Weight: 100 %   BMI:  Body mass index is 29.4 kg/(m^2). Overweight  Estimated Nutritional Needs: Kcal: 9604-5409 Protein: 106-115 gr Fluid: 1000 ml plus output  Skin: Stage 1 bilateral buttocks, generalized edema  Diet Order: Renal  EDUCATION NEEDS: -Education not appropriate at this time   Intake/Output Summary (Last 24 hours) at 10/31/12 1707 Last data filed at 10/30/12 1950  Gross per 24 hour  Intake    440 ml  Output   3300 ml  Net  -2860 ml    Last BM: 10/28/12  Labs:   Recent Labs Lab 10/28/12 0434 10/29/12 0620 10/30/12 0449 10/31/12 0522  NA 138 133* 133* 136  K 4.4 4.8 4.9 4.6  CL 102 98 98 99  CO2 29 25 23 26   BUN 36* 52* 67* 37*   CREATININE 4.70* 6.33* 7.58* 5.12*  CALCIUM 8.0* 8.0* 7.7* 7.3*  PHOS 5.1*  --  7.4*  --   GLUCOSE 158* 152* 150* 176*    CBG (last 3)   Recent Labs  10/31/12 0106 10/31/12 0344 10/31/12 1052  GLUCAP 180* 143* 226*    Scheduled Meds: . epoetin alfa  10,000 Units Intravenous 3 times weekly  . [START ON 11/01/2012] fluconazole (DIFLUCAN) IV  200 mg Intravenous Q M,W,F-HD  . insulin aspart  0-9 Units Subcutaneous Q4H  . lactulose  20 g Oral BID  . metoprolol succinate  25 mg Oral Daily  . pantoprazole  40 mg Oral Daily  . piperacillin-tazobactam (ZOSYN)  IV  2.25 g Intravenous Q8H  . rifaximin  275 mg Oral BID  . sevelamer carbonate  1,600 mg Oral TID WC  . sodium chloride  10-40 mL Intracatheter Q12H  . sodium chloride  3 mL Intravenous Q12H  . vancomycin  1,000 mg Intravenous Q M,W,F-HD    Continuous Infusions: . sodium chloride 10 mL/hr at 10/30/12 1645    Past Medical History  Diagnosis Date  . Rectal bleeding   . Dialysis care   . GI bleed   . Hypertension   . Esophageal varices   . Alcoholic liver disease   . Hepatic encephalopathy   . Ascites   . Diabetes  mellitus   . Detached retina   . Renal disorder     Past Surgical History  Procedure Laterality Date  . Esophagogastroduodenoscopy  12/31/2010    EGD [facility] THERAPY  . Esophagogastroduodenoscopy  05/31/2012E    GD [facility] ABLATION  . Small bowel givens  12/01/2010  . Colonoscopy  09/07/2010  . Esophagogastroduodenoscopy  09/05/2010    OUTLAW  . Lung surgery  6/11    [place]  . Esophagogastroduodenoscopy  03/26/2011    Procedure: ESOPHAGOGASTRODUODENOSCOPY (EGD);  Surgeon: Malissa Hippo, MD;  Location: [facility];  Service: Endoscopy;  Laterality: N/A;  8:30 am  . Hot hemostasis  03/26/2011    Procedure: HOT HEMOSTASIS (ARGON PLASMA COAGULATION/BICAP);  Surgeon: Malissa Hippo, MD;  Location: [facility];  Service: Endoscopy;  Laterality: N/A;  . Stent in liver    .  Esophagogastroduodenoscopy N/A 10/24/2012    Procedure: ESOPHAGOGASTRODUODENOSCOPY (EGD);  Surgeon: Malissa Hippo, MD;  Location: [facility];  Service: Endoscopy;  Laterality: N/A;    Royann Shivers MS,RD,LDN,CSG Office: [phone] Pager: [phone]

## 2012-10-31 NOTE — Anesthesia Postprocedure Evaluation (Signed)
  Anesthesia Post-op Note  Patient: Timothy Chan  Procedure(s) Performed: Procedure(s):  REMOVAL OF PERITONEAL DIALYSIS CATHETER (N/A)  Patient Location: room 324  Anesthesia Type:MAC  Level of Consciousness: awake, alert , oriented and patient cooperative  Airway and Oxygen Therapy: Patient Spontanous Breathing and Patient connected to nasal cannula oxygen  Post-op Pain: 3 /10, mild  Post-op Assessment: Post-op Vital signs reviewed, Patient's Cardiovascular Status Stable, Respiratory Function Stable, Patent Airway, No signs of Nausea or vomiting and Pain level controlled  Post-op Vital Signs: Reviewed and stable  Complications: No apparent anesthesia complications

## 2012-10-31 NOTE — Progress Notes (Signed)
1 Day Post-Op  Subjective: Some mild incisional pain.  Objective: Vital signs in last 24 hours: Temp:  [97.6 F (36.4 C)-98.8 F (37.1 C)] 98 F (36.7 C) (04/22 0508) Pulse Rate:  [65-92] 77 (04/22 0508) Resp:  [0-21] 16 (04/22 0508) BP: (91-159)/(57-81) 105/59 mmHg (04/22 0508) SpO2:  [94 %-100 %] 95 % (04/22 0508) Weight:  [106.7 kg (235 lb 3.7 oz)-107.9 kg (237 lb 14 oz)] 106.7 kg (235 lb 3.7 oz) (04/22 0508) Last BM Date: 10/28/12  Intake/Output from previous day: 04/21 0701 - 04/22 0700 In: 640 [P.O.:120; I.V.:470; IV Piggyback:50] Out: 3350 [Urine:50] Intake/Output this shift:    General appearance: alert and cooperative GI: Soft. Dressing dry and intact. Mild incisional tenderness.  Lab Results:   Recent Labs  10/30/12 0449 10/31/12 0522  WBC 6.2 5.1  HGB 9.1* 9.0*  HCT 26.2* 26.1*  PLT 76* 60*   BMET  Recent Labs  10/30/12 0449 10/31/12 0522  NA 133* 136  K 4.9 4.6  CL 98 99  CO2 23 26  GLUCOSE 150* 176*  BUN 67* 37*  CREATININE 7.58* 5.12*  CALCIUM 7.7* 7.3*   PT/INR  Recent Labs  10/30/12 0449 10/31/12 0522  LABPROT 15.3* 15.2  INR 1.23 1.22    Studies/Results: Dg Chest Port 1 View  10/29/2012  *RADIOLOGY REPORT*  Clinical Data: Cough, weakness, shortness of breath.  PORTABLE CHEST - 1 VIEW  Comparison: 10/26/2012  Findings: New consolidation in the right lower lobe compatible with pneumonia.  Small right pleural effusions.  Mild cardiomegaly.  No focal opacity on the left.  IMPRESSION: Right lower lobe pneumonia.  Small right effusion.   Original Report Authenticated By: Charlett Nose, M.D.     Anti-infectives: Anti-infectives   Start     Dose/Rate Route Frequency Ordered Stop   10/30/12 1425  fluconazole (DIFLUCAN) IVPB 200 mg     200 mg 100 mL/hr over 60 Minutes Intravenous Every 48 hours 10/28/12 1543     10/28/12 1330  fluconazole (DIFLUCAN) IVPB 200 mg  Status:  Discontinued     200 mg 100 mL/hr over 60 Minutes Intravenous  Every 24 hours 10/28/12 1223 10/28/12 1543   10/27/12 1400  vancomycin (VANCOCIN) IVPB 1000 mg/200 mL premix  Status:  Discontinued     1,000 mg 200 mL/hr over 60 Minutes Intravenous Every Hemodialysis 10/27/12 0940 10/27/12 1044   10/27/12 1400  vancomycin (VANCOCIN) IVPB 1000 mg/200 mL premix     1,000 mg 200 mL/hr over 60 Minutes Intravenous Every M-W-F (Hemodialysis) 10/27/12 1044     10/27/12 0900  piperacillin-tazobactam (ZOSYN) IVPB 2.25 g     2.25 g 100 mL/hr over 30 Minutes Intravenous Every 8 hours 10/27/12 0812     10/27/12 0400  vancomycin (VANCOCIN) IVPB 1000 mg/200 mL premix     1,000 mg 200 mL/hr over 60 Minutes Intravenous  Once 10/27/12 0315 10/27/12 0446   10/26/12 2300  cefTRIAXone (ROCEPHIN) 1 g in dextrose 5 % 50 mL IVPB  Status:  Discontinued     1 g 100 mL/hr over 30 Minutes Intravenous Every 24 hours 10/26/12 2239 10/27/12 0755   10/26/12 2300  rifaximin (XIFAXAN) tablet 275 mg     275 mg Oral 2 times daily 10/26/12 2258        Assessment/Plan: s/p Procedure(s):  REMOVAL OF PERITONEAL DIALYSIS CATHETER Impression: Stable, status post peritoneal dialysis catheter removal. Would leave staples in for 2 weeks. We'll follow peripherally with you.  LOS: 5 days  Franky Macho A 10/31/2012

## 2012-10-31 NOTE — Clinical Social Work Psychosocial (Signed)
Clinical Social Work Department BRIEF PSYCHOSOCIAL ASSESSMENT 10/31/2012  Patient:  Timothy Chan, Timothy Chan     Account Number:  [id number]     Admit date:  10/26/2012  Clinical Social Worker:  Nancie Neas  Date/Time:  10/31/2012 03:15 PM  Referred by:  Physician  Date Referred:  10/31/2012 Referred for  SNF Placement   Other Referral:   Interview type:  Patient Other interview type:    PSYCHOSOCIAL DATA Living Status:  ALONE Admitted from facility:   Level of care:   Primary support name:  Cindy Primary support relationship to patient:  SIBLING Degree of support available:   adequate per pt    CURRENT CONCERNS Current Concerns  Post-Acute Placement  Other - See comment   Other Concerns:   dialysis    SOCIAL WORK ASSESSMENT / PLAN CSW met with pt at bedside. Pt alert and oriented and reports he lives alone. He states that he generally manages okay and "does 75% of things without difficulty." Pt said he started on hemodialysis in June 2003 and then decided to do peritoneal dialysis several months ago. Pt was noncompliant with treatment. PD catheter removed yesterday and pt was started on hemodialysis. CSW arranged for pt to return to [facility] on Monday, Wednesday, and Friday schedule at 11:00. Pt aware. He reports he drives himself and will be able to do so for dialysis. Pt indicates his home is mostly accessible, but he on a waiting list for an agency that will help improve accessibility at home. He reports he has support from a friend and his father and sister. He is unsure of his d/c plan at this point and is awaiting PT evaluation. Pt primarily uses a wheelchair but if he returns home, would like a rollator. CSW asked about history of ETOH. Pt states he stopped drinking on his own in 2011 after a doctor told him he would die if he didn't. 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    Assessment/plan status:  Psychosocial Support/Ongoing Assessment of Needs Other assessment/ plan:   Information/referral to community resources:   Katheran James    PATIENT'S/FAMILY'S RESPONSE TO PLAN OF CARE: CSW to continue to follow and will discuss disposition with pt after PT recommendation. Will notify [facility] when pt is d/c.       Derenda Fennel, LCSW [phone]

## 2012-10-31 NOTE — Progress Notes (Signed)
I attempted again to work with the pt but nursing was again in with pt to attempt IV access.  I will plan to see him in the morning.

## 2012-10-31 NOTE — Progress Notes (Signed)
Subjective: Interval History: Has no complaints of nausea or vomiting. Abdominal pain is getting better. His appetite has this moment seems to be improving.  Objective: Vital signs in last 24 hours: Temp:  [97.6 F (36.4 C)-98.8 F (37.1 C)] 98 F (36.7 C) (04/22 0508) Pulse Rate:  [65-92] 77 (04/22 0508) Resp:  [0-21] 16 (04/22 0508) BP: (91-159)/(57-81) 105/59 mmHg (04/22 0508) SpO2:  [94 %-100 %] 95 % (04/22 0508) Weight:  [106.7 kg (235 lb 3.7 oz)-107.9 kg (237 lb 14 oz)] 106.7 kg (235 lb 3.7 oz) (04/22 0508) Weight change: -0.251 kg (-8.9 oz)  Intake/Output from previous day: 04/21 0701 - 04/22 0700 In: 640 [P.O.:120; I.V.:470; IV Piggyback:50] Out: 3350 [Urine:50] Intake/Output this shift:    General appearance: alert, cooperative and no distress Resp: clear to auscultation bilaterally Cardio: regular rate and rhythm, S1, S2 normal, no murmur, click, rub or gallop GI: Distended, nontender and positive bowel sound. Extremities: extremities normal, atraumatic, no cyanosis or edema  Lab Results:  Recent Labs  10/30/12 0449 10/31/12 0522  WBC 6.2 5.1  HGB 9.1* 9.0*  HCT 26.2* 26.1*  PLT 76* 60*   BMET:  Recent Labs  10/30/12 0449 10/31/12 0522  NA 133* 136  K 4.9 4.6  CL 98 99  CO2 23 26  GLUCOSE 150* 176*  BUN 67* 37*  CREATININE 7.58* 5.12*  CALCIUM 7.7* 7.3*   No results found for this basename: PTH,  in the last 72 hours Iron Studies: No results found for this basename: IRON, TIBC, TRANSFERRIN, FERRITIN,  in the last 72 hours  Studies/Results: Dg Chest Port 1 View  10/29/2012  *RADIOLOGY REPORT*  Clinical Data: Cough, weakness, shortness of breath.  PORTABLE CHEST - 1 VIEW  Comparison: 10/26/2012  Findings: New consolidation in the right lower lobe compatible with pneumonia.  Small right pleural effusions.  Mild cardiomegaly.  No focal opacity on the left.  IMPRESSION: Right lower lobe pneumonia.  Small right effusion.   Original Report Authenticated  By: Charlett Nose, M.D.     I have reviewed the patient's current medications.  Assessment/Plan: Problem #1 end-stage renal disease is status post hemodialysis yesterday BUN is 37 creatinine 5.1 and potassium is 4.6. Presently patient doesn't have any uremic sinus symptoms. Problem #2 peritonitis presently patient is on antibiotics and his a febrile. His white blood cell count 5.1. His catheter was removed yesterday and culture is pending. Problem #3 hepatic encephalopathy has improved Problem #4 liver cirrhosis Problem #5 anemia this is secondary to GI bleeding hemoglobin is 9 hematocrit 26.1 stable. Problem #6 diabetes Problem #7 metabolic bone disease his phosphorus is high and presently started on a binder. Plan: We'll make arrangements for patient to get dialysis tomorrow We'll check his basic metabolic panel and phosphorus in the morning.   LOS: 5 days   Timothy Chan S 10/31/2012,8:33 AM

## 2012-10-31 NOTE — Progress Notes (Signed)
Patient states he feels better. His pain has decreased significantly since PD cannula was removed yesterday. Most of his pain now is right-sided. He denies nausea vomiting melena or rectal bleeding. He is afebrile. Abdomen is distended but soft with mild tenderness primarily in right mid abdomen. Peritoneal fluid cultures remained negative. H&H is 9.0 and 26.1 and platelet count 60K. Assessment; #1 acute peritonitis. Secondary to PD cannula which was removed yesterday. Initial Gram stain revealed gram-positive cocci in pairs blood culture so far negative. He is presently being treated for bacterial as well as fungal peritonitis and continued to improve. Day 6 on antibiotics and day 4 on fluconazole. #2. Anemia. Multifactorial. H&H is stable. #3. Hepatic encephalopathy clinically stable. Recomme ndations; Will transition to Cipro 500 mg by mouth daily starting in a.m. primarily for SBP prophylaxis.

## 2012-10-31 NOTE — Evaluation (Signed)
Physical Therapy Evaluation Patient Details Name: Timothy Chan MRN: [id number] DOB: 21-Jul-1950 Today's Date: 10/31/2012 Time: 3086-5784 PT Time Calculation (min): 15 min  PT Assessment / Plan / Recommendation Clinical Impression  Several attempts have been made to see pt and we have been interrupted by other specialties.  I tried again to complete eval, but RN stated that IV had to be placed.  I will return later today to try to finich eval.    PT Assessment       Follow Up Recommendations       Does the patient have the potential to tolerate intense rehabilitation      Barriers to Discharge        Equipment Recommendations       Recommendations for Other Services     Frequency      Precautions / Restrictions Precautions Precautions: Fall Restrictions Weight Bearing Restrictions: No   Pertinent Vitals/Pain       Mobility       Exercises     PT Diagnosis:    PT Problem List:   PT Treatment Interventions:     PT Goals    Visit Information  Last PT Received On: 10/31/12    Subjective Data  Subjective: I'm feeling better since Dr. Karilyn Cota took that fluid out Patient Stated Goal: return home and feel good   Prior Functioning  Home Living Lives With: Alone Available Help at Discharge: Friend(s);Available PRN/intermittently Type of Home: House Home Access: Stairs to enter;Ramped entrance Entrance Stairs-Number of Steps: 1 Entrance Stairs-Rails: Right Home Layout: One level Bathroom Toilet: Standard Home Adaptive Equipment: Walker - rolling;Straight cane;Wheelchair - manual Prior Function Level of Independence: Independent with assistive device(s) Able to Take Stairs?: Yes Driving: Yes Vocation: Retired Musician: No difficulties    Copywriter, advertising Arousal/Alertness: Awake/alert Behavior During Therapy: WFL for tasks assessed/performed Overall Cognitive Status: Within Functional Limits for tasks assessed    Extremity/Trunk  Assessment     Balance    End of Session    GP     Konrad Penta 10/31/2012, 11:32 AM

## 2012-10-31 NOTE — Progress Notes (Addendum)
Patient ID: Timothy Chan, male   DOB: [birth year], 62 y.o.   MRN: [id number]  He feels better. He feels 60% better. Tolerating his diet. Some soreness in his abdomen.  Peritoneal dialysis cath removed yesterday. No fever. He is alert and oriented. No further black stools since admission. He is maintaining his hemoglbin. CBC    Component Value Date/Time   WBC 5.1 10/31/2012 0522   RBC 2.82* 10/31/2012 0522   HGB 9.0* 10/31/2012 0522   HCT 26.1* 10/31/2012 0522   PLT 60* 10/31/2012 0522   MCV 92.6 10/31/2012 0522   MCH 31.9 10/31/2012 0522   MCHC 34.5 10/31/2012 0522   RDW 16.5* 10/31/2012 0522   LYMPHSABS 1.0 10/26/2012 1634   MONOABS 0.8 10/26/2012 1634   EOSABS 0.0 10/26/2012 1634   BASOSABS 0.0 10/26/2012 1634     Filed Vitals:   10/30/12 1950 10/30/12 2151 10/30/12 2207 10/31/12 0508  BP: 122/66 136/71 125/75 105/59  Pulse: 77  92 77  Temp: 98.2 F (36.8 C)  98.1 F (36.7 C) 98 F (36.7 C)  TempSrc: Oral     Resp: 16  16 16   Height:      Weight:    235 lb 3.7 oz (106.7 kg)  SpO2: 96%  97% 95%   Assessment;  #1. Acute peritonitis in a patient with history of cirrhotic ascites and renal failure whose been receiving peritoneal dialysis. Preliminary catheter tip culture showed no growth.  He is on antibacterial and antifungal therapy and appears to be  improving. #2. Anemia. H&H is low but no evidence of active bleeding. Hemoglobin is stable at 9.0.  #3. Hepatic encephalopathy clinically improved.  #4. 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 INR has corrected with vitamin K implying deficiency rather than DIC.  #5.ESRD. Patient now on hemodialysis.  Recommendations.  Continue current antibiotic therapy until peritoneal fluid cultures completed.   [name]

## 2012-11-01 DIAGNOSIS — D696 Thrombocytopenia, unspecified: Secondary | ICD-10-CM

## 2012-11-01 LAB — GLUCOSE, CAPILLARY
Glucose-Capillary: 105 mg/dL — ABNORMAL HIGH (ref 70–99)
Glucose-Capillary: 148 mg/dL — ABNORMAL HIGH (ref 70–99)
Glucose-Capillary: 154 mg/dL — ABNORMAL HIGH (ref 70–99)
Glucose-Capillary: 158 mg/dL — ABNORMAL HIGH (ref 70–99)

## 2012-11-01 LAB — CBC
Hemoglobin: 8.8 g/dL — ABNORMAL LOW (ref 13.0–17.0)
RBC: 2.77 MIL/uL — ABNORMAL LOW (ref 4.22–5.81)

## 2012-11-01 LAB — CATH TIP CULTURE: Culture: NO GROWTH

## 2012-11-01 LAB — BASIC METABOLIC PANEL
CO2: 26 mEq/L (ref 19–32)
Calcium: 7.7 mg/dL — ABNORMAL LOW (ref 8.4–10.5)
Glucose, Bld: 144 mg/dL — ABNORMAL HIGH (ref 70–99)
Potassium: 4.6 mEq/L (ref 3.5–5.1)
Sodium: 136 mEq/L (ref 135–145)

## 2012-11-01 LAB — PHOSPHORUS: Phosphorus: 6.1 mg/dL — ABNORMAL HIGH (ref 2.3–4.6)

## 2012-11-01 MED ORDER — SODIUM CHLORIDE 0.9 % IV SOLN: 100.0000 mL | INTRAVENOUS | Status: DC | PRN

## 2012-11-01 MED ORDER — CAMPHOR-MENTHOL 0.5-0.5 % EX LOTN
TOPICAL_LOTION | CUTANEOUS | Status: DC | PRN
  Administered 2012-11-01: 10:00:00 via TOPICAL
  Filled 2012-11-01: qty 222

## 2012-11-01 MED ORDER — FLUCONAZOLE 100 MG PO TABS: 200.0000 mg | ORAL_TABLET | Freq: Every day | ORAL | Status: DC

## 2012-11-01 MED ORDER — ALTEPLASE 2 MG IJ SOLR
2.0000 mg | Freq: Once | INTRAMUSCULAR | Status: AC | PRN
  Filled 2012-11-01: qty 2

## 2012-11-01 MED ORDER — ALBUTEROL SULFATE (5 MG/ML) 0.5% IN NEBU
2.5000 mg | INHALATION_SOLUTION | Freq: Three times a day (TID) | RESPIRATORY_TRACT | Status: DC
  Administered 2012-11-01 – 2012-11-02 (×3): 2.5 mg via RESPIRATORY_TRACT
  Filled 2012-11-01 (×3): qty 0.5

## 2012-11-01 MED ORDER — ALBUTEROL SULFATE (5 MG/ML) 0.5% IN NEBU
2.5000 mg | INHALATION_SOLUTION | RESPIRATORY_TRACT | Status: DC | PRN
  Administered 2012-11-01 – 2012-11-02 (×3): 2.5 mg via RESPIRATORY_TRACT
  Filled 2012-11-01 (×3): qty 0.5

## 2012-11-01 MED ORDER — CAMPHOR-MENTHOL 0.5-0.5 % EX LOTN
TOPICAL_LOTION | CUTANEOUS | Status: AC
  Filled 2012-11-01: qty 222

## 2012-11-01 MED ORDER — FLUCONAZOLE 100 MG PO TABS
200.0000 mg | ORAL_TABLET | ORAL | Status: DC
  Administered 2012-11-01: 200 mg via ORAL
  Filled 2012-11-01: qty 2

## 2012-11-01 MED ORDER — CIPROFLOXACIN HCL 250 MG PO TABS
500.0000 mg | ORAL_TABLET | Freq: Every day | ORAL | Status: DC
  Administered 2012-11-01 – 2012-11-02 (×2): 500 mg via ORAL
  Filled 2012-11-01 (×2): qty 2

## 2012-11-01 NOTE — Progress Notes (Signed)
Physical Therapy Treatment Patient Details Name: Timothy Chan MRN: [id number] DOB: August 02, 1950 Today's Date: 11/01/2012 Time: 7829-5621 PT Time Calculation (min): 40 min  PT Assessment / Plan / Recommendation Comments on Treatment Session  Pt was seen for completion of evaluation.  He is extremely deconditioned.  Strength on MMT is WNL but he fatigues rapidly.  He needed mod assist to sit from supine, mod assist to stand with a walker from the bed.  He was able to stand for a minute but was unable to take any steps.  It was clear to both me and the pt that he would be unable to function alone at home.  He is agreeable to SNF at d/c and this is my strong recommendation.    Follow Up Recommendations  SNF     Does the patient have the potential to tolerate intense rehabilitation     Barriers to Discharge        Equipment Recommendations  None recommended by PT    Recommendations for Other Services    Frequency Min 3X/week   Plan Discharge plan remains appropriate;Frequency remains appropriate    Precautions / Restrictions     Pertinent Vitals/Pain     Mobility  Bed Mobility Bed Mobility: Supine to Sit;Sit to Supine Supine to Sit: 3: Mod assist;HOB flat Sit to Supine: 5: Supervision;HOB flat Transfers Transfers: Sit to Stand;Stand to Sit Sit to Stand: 3: Mod assist;From bed;From elevated surface Stand to Sit: 4: Min assist;With upper extremity assist;To bed Details for Transfer Assistance: Pt able to stand about 60 seonds before fatiguing and needing to lie back down. Ambulation/Gait Ambulation/Gait Assistance: Not tested (comment) (unable)    Exercises     PT Diagnosis:    PT Problem List:   PT Treatment Interventions:     PT Goals Acute Rehab PT Goals PT Goal Formulation: With patient Time For Goal Achievement: 11/15/12 Potential to Achieve Goals: Fair Pt will go Supine/Side to Sit: with min assist;with HOB 0 degrees PT Goal: Supine/Side to Sit - Progress: Goal set  today Pt will go Sit to Stand: with min assist PT Goal: Sit to Stand - Progress: Goal set today Pt will Ambulate: 1 - 15 feet;with mod assist PT Goal: Ambulate - Progress: Goal set today  Visit Information  Last PT Received On: 11/01/12    Subjective Data  Subjective: no c/o   Cognition       Balance  Balance Balance Assessed: Yes Dynamic Sitting Balance Dynamic Sitting - Balance Support: No upper extremity supported Dynamic Sitting - Level of Assistance: 7: Independent  End of Session PT - End of Session Equipment Utilized During Treatment: Gait belt Activity Tolerance: Patient limited by fatigue Patient left: in bed;with call bell/phone within reach;with nursing in room Nurse Communication: Mobility status   GP     Konrad Penta 11/01/2012, 1:28 PM

## 2012-11-01 NOTE — Progress Notes (Signed)
Subjective: Still weak.  No other complaints  Objective: Vital signs in last 24 hours: Filed Vitals:   11/01/12 1700 11/01/12 1730 11/01/12 1800 11/01/12 1830  BP: 126/59 99/61 99/60  92/54  Pulse: 86 86 85 86  Temp:      TempSrc:      Resp:      Height:      Weight:      SpO2:       Weight change: -1.549 kg (-3 lb 6.6 oz)  Intake/Output Summary (Last 24 hours) at 11/01/12 1906 Last data filed at 11/01/12 1800  Gross per 24 hour  Intake 1444.67 ml  Output    100 ml  Net 1344.67 ml   General: Alert. Oriented and appropriate. Lungs clear to auscultation bilaterally without wheezes rhonchi or rales Cardiovascular regular rate rhythm without murmurs gallops rubs Abdomen normal bowel sounds, soft, nontender. Dressing clean dry and intact in the lower abdomen Extremities no clubbing cyanosis or edema  Lab Results: Basic Metabolic Panel:  Recent Labs Lab 10/30/12 0449 10/31/12 0522 11/01/12 0406  NA 133* 136 136  K 4.9 4.6 4.6  CL 98 99 100  CO2 23 26 26   GLUCOSE 150* 176* 144*  BUN 67* 37* 47*  CREATININE 7.58* 5.12* 6.58*  CALCIUM 7.7* 7.3* 7.7*  PHOS 7.4*  --  6.1*   Liver Function Tests:  Recent Labs Lab 10/27/12 0446 10/29/12 0620  AST 27 42*  ALT 25 25  ALKPHOS 114 111  BILITOT 0.6 0.7  PROT 5.2* 5.3*  ALBUMIN 1.6* 1.4*   No results found for this basename: LIPASE, AMYLASE,  in the last 168 hours  Recent Labs Lab 10/26/12 1825 10/27/12 1000  AMMONIA 119* 90*   CBC:  Recent Labs Lab 10/26/12 1634  10/31/12 0522 11/01/12 0406  WBC 17.3*  < > 5.1 6.1  NEUTROABS 15.5*  --   --   --   HGB 11.4*  < > 9.0* 8.8*  HCT 33.8*  < > 26.1* 25.8*  MCV 93.1  < > 92.6 93.1  PLT 149*  < > 60* 57*  < > = values in this interval not displayed. Cardiac Enzymes:  Recent Labs Lab 10/26/12 1634  TROPONINI <0.30   BNP: No results found for this basename: PROBNP,  in the last 168 hours D-Dimer: No results found for this basename: DDIMER,  in the  last 168 hours CBG:  Recent Labs Lab 10/31/12 2147 11/01/12 0052 11/01/12 0406 11/01/12 0749 11/01/12 1122 11/01/12 1659  GLUCAP 158* 158* 148* 105* 225* 152*   Hemoglobin A1C: No results found for this basename: HGBA1C,  in the last 168 hours Fasting Lipid Panel: No results found for this basename: CHOL, HDL, LDLCALC, TRIG, CHOLHDL, LDLDIRECT,  in the last 168 hours Thyroid Function Tests: No results found for this basename: TSH, T4TOTAL, FREET4, T3FREE, THYROIDAB,  in the last 168 hours Coagulation:  Recent Labs Lab 10/28/12 0434 10/29/12 0620 10/30/12 0449 10/31/12 0522  LABPROT 27.2* 15.7* 15.3* 15.2  INR 2.68* 1.28 1.23 1.22   Anemia Panel: No results found for this basename: VITAMINB12, FOLATE, FERRITIN, TIBC, IRON, RETICCTPCT,  in the last 168 hours Urine Drug Screen: Drugs of Abuse  No results found for this basename: labopia,  cocainscrnur,  labbenz,  amphetmu,  thcu,  labbarb    Alcohol Level: No results found for this basename: ETH,  in the last 168 hours Urinalysis:  Recent Labs Lab 10/28/12 0111  COLORURINE YELLOW  LABSPEC 1.015  PHURINE 6.0  GLUCOSEU 250*  HGBUR MODERATE*  BILIRUBINUR NEGATIVE  KETONESUR TRACE*  PROTEINUR 30*  UROBILINOGEN 0.2  NITRITE NEGATIVE  LEUKOCYTESUR NEGATIVE   Micro Results: Recent Results (from the past 240 hour(s))  MRSA PCR SCREENING     Status: None   Collection Time    10/26/12 10:10 PM      Result Value Range Status   MRSA by PCR NEGATIVE  NEGATIVE Final   Comment:            The GeneXpert MRSA Assay (FDA     approved for NASAL specimens     only), is one component of a     comprehensive MRSA colonization     surveillance program. It is not     intended to diagnose MRSA     infection nor to guide or     monitor treatment for     MRSA infections.  CULTURE, BLOOD (ROUTINE X 2)     Status: None   Collection Time    10/26/12 10:39 PM      Result Value Range Status   Specimen Description BLOOD RIGHT  ARM   Final   Special Requests     Final   Value: BOTTLES DRAWN AEROBIC AND ANAEROBIC AEB 6CC ANA 4CC   Culture NO GROWTH 3 DAYS   Final   Report Status PENDING   Incomplete  CULTURE, BLOOD (ROUTINE X 2)     Status: None   Collection Time    10/26/12 10:44 PM      Result Value Range Status   Specimen Description BLOOD RIGHT ARM   Final   Special Requests     Final   Value: BOTTLES DRAWN AEROBIC AND ANAEROBIC AEB 6CC ANA 5CC   Culture NO GROWTH 3 DAYS   Final   Report Status PENDING   Incomplete  CULTURE, BODY FLUID-BOTTLE     Status: None   Collection Time    10/27/12  1:01 PM      Result Value Range Status   Specimen Description PERITONEAL   Final   Special Requests     Final   Value: BOTTLES DRAWN AEROBIC AND ANAEROBIC 6 CC EACH BOTTLE   Culture NO GROWTH 2 DAYS   Final   Report Status PENDING   Incomplete  GRAM STAIN     Status: None   Collection Time    10/27/12  1:06 PM      Result Value Range Status   Specimen Description PERITONEAL   Final   Special Requests NONE   Final   Gram Stain ABUNDANT WBC SEEN GRAM POSITIVE COCCI IN PAIRS   Final   Report Status 10/27/2012 FINAL   Final  URINE CULTURE     Status: None   Collection Time    10/28/12  1:17 AM      Result Value Range Status   Specimen Description URINE, CLEAN CATCH   Final   Special Requests zosyn, vancomycin   Final   Culture  Setup Time 10/28/2012 21:21   Final   Colony Count NO GROWTH   Final   Culture NO GROWTH   Final   Report Status 10/29/2012 FINAL   Final  BODY FLUID CULTURE     Status: None   Collection Time    10/29/12  1:01 PM      Result Value Range Status   Specimen Description PERITONEAL CAVITY   Final   Special Requests NONE   Final   Gram Stain  Final   Value: WBC PRESENT,BOTH PMN AND MONONUCLEAR     NO ORGANISMS SEEN     Performed at [facility]   Culture NO GROWTH 3 DAYS   Final   Report Status PENDING   Incomplete  SURGICAL PCR SCREEN     Status: None   Collection Time     10/30/12  3:06 AM      Result Value Range Status   MRSA, PCR NEGATIVE  NEGATIVE Final   Staphylococcus aureus NEGATIVE  NEGATIVE Final   Comment:            The Xpert SA Assay (FDA     approved for NASAL specimens     in patients over 32 years of age),     is one component of     a comprehensive surveillance     program.  Test performance has     been validated by [employer] for patients greater     than or equal to 76 year old.     It is not intended     to diagnose infection nor to     guide or monitor treatment.  CATH TIP CULTURE     Status: None   Collection Time    10/30/12 12:05 PM      Result Value Range Status   Specimen Description PERITONEAL CATH TIP   Final   Special Requests NONE   Final   Culture NO GROWTH 2 DAYS   Final   Report Status 11/01/2012 FINAL   Final   Studies/Results: No results found. Scheduled Meds: . albuterol  2.5 mg Nebulization TID  . ciprofloxacin  500 mg Oral Daily  . epoetin alfa  10,000 Units Intravenous 3 times weekly  . feeding supplement (NEPRO CARB STEADY)  120 mL Oral BID BM  . fluconazole  200 mg Oral Q48H  . insulin aspart  0-9 Units Subcutaneous Q4H  . lactulose  20 g Oral BID  . metoprolol succinate  25 mg Oral Daily  . rifaximin  275 mg Oral BID  . sevelamer carbonate  1,600 mg Oral TID WC   Continuous Infusions:   PRN Meds:.sodium chloride, sodium chloride, sodium chloride, sodium chloride, sodium chloride, sodium chloride, albuterol, alteplase, benzonatate, camphor-menthol, feeding supplement (NEPRO CARB STEADY), guaiFENesin, heparin, lidocaine (PF), lidocaine-prilocaine, pentafluoroprop-tetrafluoroeth, promethazine, traMADol Assessment/Plan: Principal Problem:   Peritonitis associated with peritoneal dialysis Active Problems:   Alcoholic cirrhosis   Hepatic encephalopathy   Diabetes mellitus   ESRD on peritoneal dialysis   Hyperammonemia   SIRS (systemic inflammatory response syndrome)   Abdominal pain    Thrombocytopenia, unspecified Worsening thrombocytopenia:  HIT panel ordered, but Dr. Kristian Covey reports ho heparin has been given. Could be infection v. Med related v. Liver ds.  Will stop protonix.  Last day iv abx today.  No bleeding.  Monitor.  Dr. Kristian Covey recommends against PICC.  Unable to get PIV.  Continue femoral cath until transitioned to PO abx.  Await PT eval   LOS: 6 days   Ron Junco L 11/01/2012, 7:06 PM

## 2012-11-01 NOTE — Progress Notes (Signed)
Patient ID: Timothy Chan, male   DOB: Apr 07, 1951, 62 y.o.   MRN: [id number] States he feels okay. Soreness mid-abdomen. He does have a cough. Cough is non-productive. Ate fair at breakfast. Abdomen is distended but soft. Slight drop in his hemoglobin. Bilateral wheezes. He will redeive a breathing tx this am per his RN.  CBC    Component Value Date/Time   WBC 6.1 11/01/2012 0406   RBC 2.77* 11/01/2012 0406   HGB 8.8* 11/01/2012 0406   HCT 25.8* 11/01/2012 0406   PLT 57* 11/01/2012 0406   MCV 93.1 11/01/2012 0406   MCH 31.8 11/01/2012 0406   MCHC 34.1 11/01/2012 0406   RDW 16.4* 11/01/2012 0406   LYMPHSABS 1.0 10/26/2012 1634   MONOABS 0.8 10/26/2012 1634   EOSABS 0.0 10/26/2012 1634   BASOSABS 0.0 10/26/2012 1634                  Patient states he feels better. His pain has decreased significantly since PD cannula was removed yesterday. Most of his pain now is right-sided.  He denies nausea vomiting melena or rectal bleeding.  He is afebrile.  Abdomen is distended but soft with mild tenderness primarily in right mid abdomen.  Peritoneal fluid cultures remained negative.  H&H is 9.0 and 26.1 and platelet count 60K.  Assessment;  #1 acute peritonitis. Secondary to PD cannula which has been removed.. Initial Gram stain revealed gram-positive cocci in pairs blood culture so far negative. He is presently being treated for bacterial as well as fungal peritonitis and continued to improve.  #2. Anemia. Multifactorial. Slight drop in hemoglobin.  #3. Hepatic encephalopathy clinically stable.  Recomme ndations;  Will tr Cipro 500 mg by mouth daily  primarily for SBP prophylaxis.

## 2012-11-01 NOTE — Clinical Social Work Note (Signed)
CSW discussed PT recommendation of SNF with pt. He seems reluctantly agreeable, but said he wants to arrange help at home. Pt admitted he would need to go to SNF until he can set this up on his own, but then said he wanted to think about it a day. He agreed for CSW to initiate bed search and then follow up to discuss more tomorrow. Aware of Medicare coverage/criteria. Thinks [facility] would be best as his dialysis is at [facility] in [place].   Derenda Fennel, [place] [phone]

## 2012-11-01 NOTE — Procedures (Signed)
   HEMODIALYSIS TREATMENT NOTE:  4 hour hemodialysis session completed through left forearm AVF.  3L goal ordered with desire to maintain SBP>90.  Tolerated removal of 2.8 liters with 21 minutes of interrupted ultrafiltration time due to asymptomatic hypotension.  All blood was reinfused.  Hemostasis achieved within 15 minutes.  Report given to [place], [place]

## 2012-11-01 NOTE — Clinical Social Work Placement (Signed)
Clinical Social Work Department CLINICAL SOCIAL WORK PLACEMENT NOTE 11/01/2012  Patient:  Timothy Chan, Timothy Chan  Account Number:  [id number] Admit date:  10/26/2012  Clinical Social Worker:  Derenda Fennel, LCSW  Date/time:  11/01/2012 01:25 PM  Clinical Social Work is seeking post-discharge placement for this patient at the following level of care:   SKILLED NURSING   (*CSW will update this form in Epic as items are completed)   11/01/2012  Patient/family provided with [facility] Department of Clinical Social Work's list of facilities offering this level of care within the geographic area requested by the patient (or if unable, by the patient's family).  11/01/2012  Patient/family informed of their freedom to choose among providers that offer the needed level of care, that participate in Medicare, Medicaid or managed care program needed by the patient, have an available bed and are willing to accept the patient.  11/01/2012  Patient/family informed of [facility]' ownership interest in [facility], as well as of the fact that they are under no obligation to receive care at this facility.  PASARR submitted to EDS on  PASARR number received from EDS on   FL2 transmitted to all facilities in geographic area requested by pt/family on  11/01/2012 FL2 transmitted to all facilities within larger geographic area on   Patient informed that his/her managed care company has contracts with or will negotiate with  certain facilities, including the following:     Patient/family informed of bed offers received:   Patient chooses bed at  Physician recommends and patient chooses bed at    Patient to be transferred to  on   Patient to be transferred to facility by   The following physician request were entered in Epic:   Additional Comments: existing pasarr number   Derenda Fennel, LCSW [phone]

## 2012-11-01 NOTE — Progress Notes (Addendum)
Timothy Chan  MRN: [id number]  DOB/AGE: [birth year] 62 y.o.  Primary Care Physician:VYAS,DHRUV B., MD  Admit date: 10/26/2012  Chief Complaint:  Chief Complaint  Patient presents with  . Abdominal Pain    S-Pt presented on  10/26/2012 with  Chief Complaint  Patient presents with  . Abdominal Pain  .  Pt says his abdomen feels better   Meds . ciprofloxacin  500 mg Oral Daily  . epoetin alfa  10,000 Units Intravenous 3 times weekly  . feeding supplement (NEPRO CARB STEADY)  120 mL Oral BID BM  . fluconazole  200 mg Oral Q48H  . insulin aspart  0-9 Units Subcutaneous Q4H  . lactulose  20 g Oral BID  . metoprolol succinate  25 mg Oral Daily  . rifaximin  275 mg Oral BID  . sevelamer carbonate  1,600 mg Oral TID WC      Physical Exam: Vital signs in last 24 hours: Temp:  [97.5 F (36.4 C)-98.2 F (36.8 C)] 98.2 F (36.8 C) (04/23 0702) Pulse Rate:  [71-84] 78 (04/23 0702) Resp:  [20-24] 20 (04/23 0702) BP: (123-158)/(52-76) 135/66 mmHg (04/23 0702) SpO2:  [94 %-98 %] 98 % (04/23 0907) Weight:  [232 lb 9.4 oz (105.5 kg)] 232 lb 9.4 oz (105.5 kg) (04/23 0407) Weight change: -3 lb 6.6 oz (-1.549 kg) Last BM Date: 10/31/12  Intake/Output from previous day: 04/22 0701 - 04/23 0700 In: 1424.7 [P.O.:920; I.V.:354.7; IV Piggyback:150] Out: 100 [Urine:100]     Physical Exam: General- pt is awake,alert, oriented to time place and person Resp- No acute REsp distress, CTA B/L NO Rhonchi CVS- S1S2 regular in rate and rhythm GIT- BS+, soft, Tender+,Distended+ EXT- 1+ LE Edema, NO Cyanosis, Bruised left arm  Access- Left AVF +  Lab Results: CBC  Recent Labs  10/31/12 0522 11/01/12 0406  WBC 5.1 6.1  HGB 9.0* 8.8*  HCT 26.1* 25.8*  PLT 60* 57*    BMET  Recent Labs  10/31/12 0522 11/01/12 0406  NA 136 136  K 4.6 4.6  CL 99 100  CO2 26 26  GLUCOSE 176* 144*  BUN 37* 47*  CREATININE 5.12* 6.58*  CALCIUM 7.3* 7.7*    MICRO Recent Results (from the  past 240 hour(s))  MRSA PCR SCREENING     Status: None   Collection Time    10/26/12 10:10 PM      Result Value Range Status   MRSA by PCR NEGATIVE  NEGATIVE Final   Comment:            The GeneXpert MRSA Assay (FDA     approved for NASAL specimens     only), is one component of a     comprehensive MRSA colonization     surveillance program. It is not     intended to diagnose MRSA     infection nor to guide or     monitor treatment for     MRSA infections.  CULTURE, BLOOD (ROUTINE X 2)     Status: None   Collection Time    10/26/12 10:39 PM      Result Value Range Status   Specimen Description BLOOD RIGHT ARM   Final   Special Requests     Final   Value: BOTTLES DRAWN AEROBIC AND ANAEROBIC AEB 6CC ANA 4CC   Culture NO GROWTH 3 DAYS   Final   Report Status PENDING   Incomplete  CULTURE, BLOOD (ROUTINE X 2)     Status: None  Collection Time    10/26/12 10:44 PM      Result Value Range Status   Specimen Description BLOOD RIGHT ARM   Final   Special Requests     Final   Value: BOTTLES DRAWN AEROBIC AND ANAEROBIC AEB 6CC ANA 5CC   Culture NO GROWTH 3 DAYS   Final   Report Status PENDING   Incomplete  CULTURE, BODY FLUID-BOTTLE     Status: None   Collection Time    10/27/12  1:01 PM      Result Value Range Status   Specimen Description PERITONEAL   Final   Special Requests     Final   Value: BOTTLES DRAWN AEROBIC AND ANAEROBIC 6 CC EACH BOTTLE   Culture NO GROWTH 2 DAYS   Final   Report Status PENDING   Incomplete  GRAM STAIN     Status: None   Collection Time    10/27/12  1:06 PM      Result Value Range Status   Specimen Description PERITONEAL   Final   Special Requests NONE   Final   Gram Stain ABUNDANT WBC SEEN GRAM POSITIVE COCCI IN PAIRS   Final   Report Status 10/27/2012 FINAL   Final  URINE CULTURE     Status: None   Collection Time    10/28/12  1:17 AM      Result Value Range Status   Specimen Description URINE, CLEAN CATCH   Final   Special Requests zosyn,  vancomycin   Final   Culture  Setup Time 10/28/2012 21:21   Final   Colony Count NO GROWTH   Final   Culture NO GROWTH   Final   Report Status 10/29/2012 FINAL   Final  BODY FLUID CULTURE     Status: None   Collection Time    10/29/12  1:01 PM      Result Value Range Status   Specimen Description PERITONEAL CAVITY   Final   Special Requests NONE   Final   Gram Stain     Final   Value: WBC PRESENT,BOTH PMN AND MONONUCLEAR     NO ORGANISMS SEEN     Performed at [facility]   Culture NO GROWTH 3 DAYS   Final   Report Status PENDING   Incomplete  SURGICAL PCR SCREEN     Status: None   Collection Time    10/30/12  3:06 AM      Result Value Range Status   MRSA, PCR NEGATIVE  NEGATIVE Final   Staphylococcus aureus NEGATIVE  NEGATIVE Final   Comment:            The Xpert SA Assay (FDA     approved for NASAL specimens     in patients over 96 years of age),     is one component of     a comprehensive surveillance     program.  Test performance has     been validated by [employer] for patients greater     than or equal to 51 year old.     It is not intended     to diagnose infection nor to     guide or monitor treatment.  CATH TIP CULTURE     Status: None   Collection Time    10/30/12 12:05 PM      Result Value Range Status   Specimen Description PERITONEAL CATH TIP   Final   Special Requests NONE  Final   Culture NO GROWTH 2 DAYS   Final   Report Status 11/01/2012 FINAL   Final      Lab Results  Component Value Date   CALCIUM 7.7* 11/01/2012   PHOS 6.1* 11/01/2012   Alb-1.4 Corrected Calcium 7.4+2.1=9.5      Impression: 1)Renal   ESrd on Hemodialysis. Pt was on PD  Pt had Peritonitis and PD catheter d/ced Pt now on HD.    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 Corrected calcium at goal  5)ID- Admitted with Peritonotis On IV Vamco + Zosyn + Diflucan  6)FEN  Normokalemic NOrmonatremic   7)Acid base Co2 at goal     Plan:  Will dialyze today. Agree with current tx and plan.  Addendum Pt seen on Dialysis. Pt tolerating tx well.    Cristal Qadir S 11/01/2012, 10:53 AM

## 2012-11-02 LAB — CBC
HCT: 25.8 % — ABNORMAL LOW (ref 39.0–52.0)
MCH: 32.1 pg (ref 26.0–34.0)
MCV: 93.1 fL (ref 78.0–100.0)
RBC: 2.77 MIL/uL — ABNORMAL LOW (ref 4.22–5.81)
RDW: 16.4 % — ABNORMAL HIGH (ref 11.5–15.5)
WBC: 5.2 10*3/uL (ref 4.0–10.5)

## 2012-11-02 LAB — CULTURE, BLOOD (ROUTINE X 2): Culture: NO GROWTH

## 2012-11-02 LAB — GLUCOSE, CAPILLARY
Glucose-Capillary: 210 mg/dL — ABNORMAL HIGH (ref 70–99)
Glucose-Capillary: 157 mg/dL — ABNORMAL HIGH (ref 70–99)
Glucose-Capillary: 185 mg/dL — ABNORMAL HIGH (ref 70–99)

## 2012-11-02 LAB — CULTURE, BODY FLUID W GRAM STAIN -BOTTLE: Culture: NO GROWTH

## 2012-11-02 LAB — BODY FLUID CULTURE

## 2012-11-02 MED ORDER — INSULIN ASPART 100 UNIT/ML ~~LOC~~ SOLN
0.0000 [IU] | Freq: Three times a day (TID) | SUBCUTANEOUS | Status: DC
  Administered 2012-11-02: 2 [IU] via SUBCUTANEOUS

## 2012-11-02 MED ORDER — INSULIN ASPART 100 UNIT/ML ~~LOC~~ SOLN: 0.0000 [IU] | Freq: Three times a day (TID) | SUBCUTANEOUS | Status: DC

## 2012-11-02 MED ORDER — INSULIN ASPART 100 UNIT/ML ~~LOC~~ SOLN: 0.0000 [IU] | Freq: Every day | SUBCUTANEOUS | Status: DC

## 2012-11-02 MED ORDER — ALBUTEROL SULFATE (5 MG/ML) 0.5% IN NEBU: 2.5000 mg | INHALATION_SOLUTION | RESPIRATORY_TRACT | Status: DC | PRN

## 2012-11-02 MED ORDER — SEVELAMER CARBONATE 800 MG PO TABS: 1600.0000 mg | ORAL_TABLET | Freq: Three times a day (TID) | ORAL | Status: DC

## 2012-11-02 MED ORDER — TRAMADOL HCL 50 MG PO TABS: 50.0000 mg | ORAL_TABLET | Freq: Four times a day (QID) | ORAL | Status: DC | PRN

## 2012-11-02 MED ORDER — FLUCONAZOLE 200 MG PO TABS: 200.0000 mg | ORAL_TABLET | Freq: Once | ORAL | Status: DC

## 2012-11-02 MED ORDER — LACTULOSE 10 GM/15ML PO SOLN: 20.0000 g | Freq: Two times a day (BID) | ORAL | Status: DC

## 2012-11-02 MED ORDER — CIPROFLOXACIN HCL 500 MG PO TABS: 500.0000 mg | ORAL_TABLET | Freq: Every day | ORAL | Status: DC

## 2012-11-02 MED ORDER — MUCINEX DM 30-600 MG PO TB12: 1.0000 | ORAL_TABLET | Freq: Two times a day (BID) | ORAL | Status: DC

## 2012-11-02 NOTE — Discharge Summary (Signed)
Physician Discharge Summary  Patient ID: Timothy Chan MRN: [id number] DOB/AGE: 1950/12/21 62 y.o.  Admit date: 10/26/2012 Discharge date: 11/02/2012  Discharge Diagnoses:  Principal Problem:   Peritonitis associated with peritoneal dialysis Active Problems:   Alcoholic cirrhosis   Hepatic encephalopathy   Diabetes mellitus   ESRD on peritoneal dialysis Hepatic encephalopathy Sepsis   Abdominal pain   Thrombocytopenia, unspecified, chronic, secondary to cirrhosis  acute on chronic anemia Debility Coagulopathy    Medication List    STOP taking these medications       esomeprazole 40 MG capsule  Commonly known as:  NEXIUM     furosemide 80 MG tablet  Commonly known as:  LASIX     GLUCOTROL 5 MG tablet  Generic drug:  glipiZIDE     lanthanum 1000 MG chewable tablet  Commonly known as:  FOSRENOL     LAXATIVE PO     lidocaine-prilocaine cream  Commonly known as:  EMLA     metoprolol succinate 25 MG 24 hr tablet  Commonly known as:  TOPROL-XL     multivitamin Tabs tablet     ondansetron 4 MG tablet  Commonly known as:  ZOFRAN     potassium chloride 10 MEQ tablet  Commonly known as:  K-DUR      TAKE these medications       albuterol (5 MG/ML) 0.5% nebulizer solution  Commonly known as:  PROVENTIL  Take 0.5 mLs (2.5 mg total) by nebulization every 4 (four) hours as needed for wheezing or shortness of breath.     ciprofloxacin 500 MG tablet  Commonly known as:  CIPRO  Take 1 tablet (500 mg total) by mouth daily. indefinately for SBP prophylaxis     fluconazole 200 MG tablet  Commonly known as:  DIFLUCAN  Take 1 tablet (200 mg total) by mouth once. On 4/25 only  Start taking on:  11/03/2012     insulin aspart 100 UNIT/ML injection  Commonly known as:  novoLOG  Inject 0-5 Units into the skin at bedtime.     insulin aspart 100 UNIT/ML injection  Commonly known as:  novoLOG  Inject 0-9 Units into the skin 3 (three) times daily with meals.     lactulose  10 GM/15ML solution  Commonly known as:  CHRONULAC  Take 30 mLs (20 g total) by mouth 2 (two) times daily.     MUCINEX DM 30-600 MG Tb12  Take 1 tablet by mouth 2 (two) times daily. For a week, then as needed for cough and congestion     rifaximin 550 MG Tabs  Commonly known as:  XIFAXAN  Take 275 mg by mouth 2 (two) times daily. Patient takes 1/2 pill in the morning and 1/2 pill at night     sevelamer carbonate 800 MG tablet  Commonly known as:  RENVELA  Take 2 tablets (1,600 mg total) by mouth 3 (three) times daily with meals.     traMADol 50 MG tablet  Commonly known as:  ULTRAM  Take 1 tablet (50 mg total) by mouth every 6 (six) hours as needed. For pain            Discharge Orders   Future Appointments Provider Department Dept Phone   12/05/2012 11:00 AM Malissa Hippo, MD [facility] [phone]   Future Orders Complete By Expires     Diet - low sodium heart healthy  As directed     Diet Carb Modified  As directed  Walk with assistance  As directed        Follow-up Information   Follow up with dialysis  (mon, wed, fri)      remove staples on Monday May 5  Disposition: Skilled nursing facility  Discharged Condition: Stable  Consults: Treatment Team:  Malissa Hippo, MD Jamse Mead, MD Franky Macho, MD  Labs:    Sodium   135 138 133 133 136 136     Sodium   Potassium   4.7 4.4 4.8 4.9 4.6 4.6     Potassium   Chloride   96 102 98 98 99 100     Chloride   CO2   24 29 25 23 26 26      CO2   BUN   72 36 52 67 37 47     BUN   Creatinine, Ser   7.96 4.70 6.33 7.58 5.12 6.58     Creatinine, Ser   Calcium   8.2 8.0 8.0 7.7 7.3 7.7     Calcium   GFR calc non Af Amer   6 12 9 7 11 8      GFR calc non Af Amer   GFR calc Af Amer   7 14 10 8 13 9      GFR calc Af Amer   Glucose, Bld   402 158 152 150 176 144     Glucose, Bld   Phosphorus    5.1  7.4  6.1     Phosphorus   Alkaline Phosphatase   114  111        Alkaline Phosphatase    Albumin   1.6  1.4        Albumin   AST   27  42        AST   ALT   25  25        ALT   Total Protein   5.2  5.3        Total Protein   Ammonia   119 90         Ammonia   Total Bilirubin   0.6  0.7        Total Bilirubin   CARDIAC PROFILE    Troponin I   <0.30          Troponin I   OTHER CHEM    Lactic Acid, Venous   2.9          Lactic Acid, Venous   CBC    WBC   27.7 16.5 10.4 6.2 5.1 6.1 5.2    WBC   RBC   2.91 2.51 2.49 2.85 2.82 2.77 2.77    RBC   Hemoglobin   9.3 8.1 8.0 9.1 9.0 8.8 8.9    Hemoglobin   HCT   27.3 23.9 23.4 26.2 26.1 25.8 25.8    HCT   MCV   93.8 95.2 94.0 91.9 92.6 93.1 93.1    MCV   MCH   32.0 32.3 32.1 31.9 31.9 31.8 32.1    MCH   MCHC   34.1 33.9 34.2 34.7 34.5 34.1 34.5    MCHC   RDW   16.9 17.1 16.7 16.1 16.5 16.4 16.4    RDW   Platelets   117 90 89 76 60 57 62    Platelets   DIFFERENTIAL    Neutrophils Relative   89          Neutrophils Relative   Lymphocytes Relative  6          Lymphocytes Relative   Monocytes Relative   4          Monocytes Relative   Eosinophils Relative   0          Eosinophils Relative   Basophils Relative   0          Basophils Relative   Neutro Abs   15.5          Neutro Abs   Lymphs Abs   1.0          Lymphs Abs   Monocytes Absolute   0.8          Monocytes Absolute   Eosinophils Absolute   0.0          Eosinophils Absolute   Basophils Absolute   0.0          Basophils Absolute   PROTIME W/ INR    Prothrombin Time    27.2 15.7 15.3 15.2      Prothrombin Time   INR    2.68 1.28 1.23 1.22      INR   DIABETES    Glucose, Bld   402 158 152 150 176 144     Glucose, Bld    URINALYSIS    Color, Urine    YELLOW         Color, Urine   APPearance    CLEAR         APPearance   Specific Gravity, Urine    1.015         Specific Gravity, Urine   pH    6.0         pH   Glucose, UA    250         Glucose, UA   Bilirubin Urine    NEGATIVE         Bilirubin Urine   Ketones, ur    TRACE         Ketones, ur   Protein, ur    30          Protein, ur   Urobilinogen, UA    0.2         Urobilinogen, UA   Nitrite    NEGATIVE         Nitrite   Leukocytes, UA    NEGATIVE         Leukocytes, UA   Hgb urine dipstick    MODERATE         Hgb urine dipstick   WBC, UA    0-2         WBC, UA   RBC / HPF    0-2         RBC / HPF   Squamous Epithelial / LPF    RARE         Squamous Epithelial / LPF   Bacteria, UA    MANY         Bacteria, UA   OTHER FLUIDS    Monocyte-Macrophage-Serous Fluid      7       Monocyte-Macrophage-Serous Fluid   CHEMISTRY, FLUID    Total protein, fluid    0.8         Total protein, fluid   Fluid Type-FCT    PERITONEAL  PERITONEAL       Fluid Type-FCT   Fluid Type-FTP    PERITONEAL         Fluid Type-FTP  HEMATOLOGY/CELLS, FLUID    Color, Fluid    YELLOW  YELLOW       Color, Fluid   WBC, Fluid    3866  1099       WBC, Fluid   Lymphs, Fluid    11  13       Lymphs, Fluid   Eos, Fluid      1       Eos, Fluid   Appearance, Fluid    HAZY  HAZY       Appearance, Fluid   Neutrophil Count, Fluid    89  79       Neutrophil Count, Fluid   Monocyte-Macrophage-Serous Fluid      7   Recent Results (from the past 240 hour(s))  MRSA PCR SCREENING     Status: None   Collection Time    10/26/12 10:10 PM      Result Value Range Status   MRSA by PCR NEGATIVE  NEGATIVE Final   Comment:            The GeneXpert MRSA Assay (FDA     approved for NASAL specimens     only), is one component of a     comprehensive MRSA colonization     surveillance program. It is not     intended to diagnose MRSA     infection nor to guide or     monitor treatment for     MRSA infections.  CULTURE, BLOOD (ROUTINE X 2)     Status: None   Collection Time    10/26/12 10:39 PM      Result Value Range Status   Specimen Description BLOOD RIGHT ARM   Final   Special Requests     Final   Value: BOTTLES DRAWN AEROBIC AND ANAEROBIC AEB 6CC ANA 4CC   Culture NO GROWTH 7 DAYS   Final   Report Status 11/02/2012 FINAL   Final  CULTURE, BLOOD  (ROUTINE X 2)     Status: None   Collection Time    10/26/12 10:44 PM      Result Value Range Status   Specimen Description BLOOD RIGHT ARM   Final   Special Requests     Final   Value: BOTTLES DRAWN AEROBIC AND ANAEROBIC AEB 6CC ANA 5CC   Culture NO GROWTH 7 DAYS   Final   Report Status 11/02/2012 FINAL   Final  CULTURE, BODY FLUID-BOTTLE     Status: None   Collection Time    10/27/12  1:01 PM      Result Value Range Status   Specimen Description PERITONEAL   Final   Special Requests     Final   Value: BOTTLES DRAWN AEROBIC AND ANAEROBIC 6 CC EACH BOTTLE   Culture NO GROWTH 6 DAYS   Final   Report Status 11/02/2012 FINAL   Final  GRAM STAIN     Status: None   Collection Time    10/27/12  1:06 PM      Result Value Range Status   Specimen Description PERITONEAL   Final   Special Requests NONE   Final   Gram Stain ABUNDANT WBC SEEN GRAM POSITIVE COCCI IN PAIRS   Final   Report Status 10/27/2012 FINAL   Final  URINE CULTURE     Status: None   Collection Time    10/28/12  1:17 AM      Result Value Range Status   Specimen Description URINE, CLEAN CATCH  Final   Special Requests zosyn, vancomycin   Final   Culture  Setup Time 10/28/2012 21:21   Final   Colony Count NO GROWTH   Final   Culture NO GROWTH   Final   Report Status 10/29/2012 FINAL   Final  BODY FLUID CULTURE     Status: None   Collection Time    10/29/12  1:01 PM      Result Value Range Status   Specimen Description PERITONEAL CAVITY   Final   Special Requests NONE   Final   Gram Stain     Final   Value: WBC PRESENT,BOTH PMN AND MONONUCLEAR     NO ORGANISMS SEEN     Performed at [facility]   Culture NO GROWTH 3 DAYS   Final   Report Status PENDING   Incomplete  SURGICAL PCR SCREEN     Status: None   Collection Time    10/30/12  3:06 AM      Result Value Range Status   MRSA, PCR NEGATIVE  NEGATIVE Final   Staphylococcus aureus NEGATIVE  NEGATIVE Final   Comment:            The Xpert SA Assay (FDA      approved for NASAL specimens     in patients over 48 years of age),     is one component of     a comprehensive surveillance     program.  Test performance has     been validated by [employer] for patients greater     than or equal to 58 year old.     It is not intended     to diagnose infection nor to     guide or monitor treatment.  CATH TIP CULTURE     Status: None   Collection Time    10/30/12 12:05 PM      Result Value Range Status   Specimen Description PERITONEAL CATH TIP   Final   Special Requests NONE   Final   Culture NO GROWTH 2 DAYS   Final   Report Status 11/01/2012 FINAL   Final   Diagnostics:  Ct Abdomen Pelvis W Contrast  10/27/2012  *RADIOLOGY REPORT*  Clinical Data: Acute abdominal pain, vomiting and jaundice.  CT ABDOMEN AND PELVIS WITH CONTRAST  Technique:  Multidetector CT imaging of the abdomen and pelvis was performed following the standard protocol during bolus administration of intravenous contrast.  Contrast: 100 mL of Omnipaque 300 IV contrast  Comparison: CT of the abdomen and pelvis performed 10/16/2012, MRI of the lumbar spine performed 03/07/2012, and abdominal ultrasound performed 08/10/2012  Findings: There is a persistent somewhat loculated small right pleural effusion, with associated pleural thickening.  Round airspace opacity at the right lower lobe again likely reflects round atelectasis.  Both of these findings are generally stable from 2012.  Scattered coronary artery calcifications are seen.  A small to moderate amount of free fluid is noted within the lower abdomen and pelvis, tracking predominately along the left paracolic gutter and about small bowel loops.  This is slightly decreased from the prior study, and reflects the patient's peritoneal dialysis.  The degree of soft tissue edema within the small bowel mesentery is relatively stable, and may reflect reactive change secondary to intra-abdominal fluid.  A vague 2.6 cm partially hypodense  heterogeneous lesion within the right hepatic lobe is nonspecific.  The liver is decreased in size, with mild nodularity, compatible with  cirrhosis. The patient's TIPS is grossly unchanged in appearance; its patency is not well assessed on this study.  The spleen is unremarkable.  The pancreas is somewhat atrophic; dense calcification at the pancreatic head likely reflects sequelae of chronic pancreatitis.  Scattered peripancreatic nodes are grossly unchanged.  The adrenal glands are within normal limits.  Chronic bilateral renal atrophy is noted.  Nonspecific perinephric stranding is noted bilaterally.  Scattered tiny renal cysts are again noted.  There is no evidence of hydronephrosis.  No renal or ureteral stones are seen.  There is suggestion of mild irregular wall thickening along the jejunum, which could reflect a mild infectious process, or could be reactive in nature.  The patient's peritoneal dialysis catheter is noted ending along the anterior left hemipelvis, above the bladder. The stomach is within normal limits.  No acute vascular abnormalities are seen.  Diffuse calcification is noted along the abdominal aorta and its branches, including at the origins of both renal arteries.  The appendix is normal in caliber and contains air.  Contrast progresses to the level of the ascending colon.  Previously noted soft tissue inflammation at the proximal sigmoid colon appears have resolved.  The colon is grossly unremarkable in appearance.  The bladder is mildly distended and grossly unremarkable.  The prostate remains normal in size.  No inguinal lymphadenopathy is seen.  No acute osseous abnormalities are identified.  There is partial chronic osseous fusion at L1-L2.  IMPRESSION:  1.  Previously noted changes suggestive of focal colitis have resolved. 2.  Apparent irregular wall thickening along much of the jejunum, possibly reflecting an acute infectious process. 3.  Small to moderate volume ascites likely  reflects peritoneaRight femoral central linel dialysis, slightly Removal of peritoneal dialysis catheterdecreased in volume from the prior study. Diffuse mesenteric edema may be reactive secondary to surrounding fluid. 4.  Small right-sided pleural effusion, with loculation and pleural thickening.  Rounded right lower lobe airspace disease appears to reflect rounded atelectasis.  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 6.  Patency of the TIPS is not well assessed on this study; cirrhotic change again noted.  2.6 cm heterogeneous lesion within the right hepatic lobe is somewhat better characterized than on prior studies.  Though this could simply be postoperative, given adjacent underlying calcification, dynamic liver protocol MRI or CT could be considered for further evaluation.  MRI should only be performed if the patient can hold his breath for the study. 7.  Chronic bilateral renal atrophy noted, with scattered tiny bilateral renal cysts. 8.  Diffuse calcification along the abdominal aorta and its branches, including at the origins of both renal arteries.   Original Report Authenticated By: Tonia Ghent, M.D.    [facility] 1 View  10/29/2012  *RADIOLOGY REPORT*  Clinical Data: Cough, weakness, shortness of breath.  PORTABLE CHEST - 1 VIEW  Comparison: 10/26/2012  Findings: New consolidation in the right lower lobe compatible with pneumonia.  Small right pleural effusions.  Mild cardiomegaly.  No focal opacity on the left.  IMPRESSION: Right lower lobe pneumonia.  Small right effusion.   Original Report Authenticated By: Charlett Nose, M.D.    Dg Chest Portable 1 View  10/26/2012  *RADIOLOGY REPORT*  Clinical Data: Abdominal pain  PORTABLE CHEST - 1 VIEW  Comparison: 4/11/ 14  Findings: Cardiomegaly again noted.  No acute infiltrate or pulmonary edema.  Stable right basilar scarring.  Mild hyperinflation.  IMPRESSION: .  Cardiomegaly.  Stable  right basilar scarring.  No acute infiltrate or pulmonary edema.   Original Report Authenticated By: Natasha Mead, M.D.    Dg Chest Portable 1 View  10/20/2012  *RADIOLOGY REPORT*  Clinical Data: Generalized weakness.  Shortness of breath. Hyperglycemia.  PORTABLE CHEST - 1 VIEW 10/20/2012 1852 hours:  Comparison: One-view chest x-ray 09/18/2012.  Two-view chest x-ray 08/16/2012.  Portable chest x-ray 10/11/2011.  Findings: Cardiac silhouette enlarged but stable.  Thoracic aorta atherosclerotic, unchanged.  Hilar and mediastinal contours otherwise unremarkable.  Emphysematous changes in the upper lobes, left greater than right, unchanged.  Pleuroparenchymal scarring at the right base, unchanged.  No new pulmonary parenchymal abnormalities.  IMPRESSION: Stable cardiomegaly.  Stable COPD/emphysema and pleuroparenchymal scarring at the right base.  No acute cardiopulmonary disease.   Original Report Authenticated By: Hulan Saas, M.D.     Procedures:  Right femoral CVL  Removal of peritoneal dialysis catheter  EKG: Sinus tachycardia  Full Code   Hospital Course:  See H&P for complete admission details. The patient is a 62 year old white male with multiple medical problems. 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 He presented with abdominal pain and lethargy. He had been getting peritoneal dialysis. He had been admitted prior to this admission but left AGAINST MEDICAL ADVICE. His relatives brought them in this time with severe abdominal pain, vomiting tachycardia, hyperammonemia, leukocytosis, lethargy. His blood pressure was 93/43. Heart rate 1:30. Respiratory rate up to 34. Afebrile. He had dry mucous membranes. He appeared ill. Abdomen was distended and tender. Peritoneal dialysis catheter site was clean. Increased abdominal sounds. Intertriginous candida. Skin was dry. White blood cell count was 17,000. Ammonia was 119. Chest x-ray showed  nothing acute.  Patient was admitted to step down. He was started on broad-spectrum antibiotics. Fluid from the peritoneal dialysis catheter was analyzed and suggestive of bacterial peritonitis. GI was consulted. Nephrology was consulted. He has hemodialysis access and was hemodialyzed while here. Nephrology was of course consulted. Gram stain of the peritoneal fluid showed gram-positive cocci in pairs, but all cultures have been negative. Patient also was started on Diflucan empirically. Patient had difficult access and surgery placed a right femoral line. GI and nephrology both agreed that peritoneal dialysis catheter must be removed. Preoperatively, INR was above 2, and hgb dropped, so patient was transfused and given vitamin K which corrected the coagulopathy.  Patient has completed a course of antibiotics for bacterial peritonitis. He will remain on Cipro 500 mg daily for peritonitis prophylaxis per recommendations of Dr. Karilyn Cota.  He remains very weak and will benefit from short-term skilled nursing facility.  Discharge Exam:  Blood pressure 119/72, pulse 104, temperature 98.4 F (36.9 C), temperature source Oral, resp. rate 18, height 6\' 3"  (1.905 m), weight 103.2 kg (227 lb 8.2 oz), SpO2 98.00%.  General: Alert, oriented Lungs clear to auscultation bilaterally without wheeze rhonchi or rales Cardiovascular regular rate rhythm without murmurs gallops rubs Abdomen soft, incision looks okay staples in place. No drainage. Extremities no clubbing cyanosis or edema  Signed: Melchor Kirchgessner L 11/02/2012, 9:29 AM

## 2012-11-02 NOTE — Progress Notes (Signed)
Inpatient Diabetes Program Recommendations  AACE/ADA: New Consensus Statement on Inpatient Glycemic Control (2013)  Target Ranges:  Prepandial:   less than 140 mg/dL      Peak postprandial:   less than 180 mg/dL (1-2 hours)      Critically ill patients:  140 - 180 mg/dL   Results for WARWICK, NICK (MRN [id number]) as of 11/02/2012 08:54  Ref. Range 11/01/2012 07:49 11/01/2012 11:22 11/01/2012 16:59 11/01/2012 20:44 11/02/2012 01:48 11/02/2012 05:13 11/02/2012 08:08  Glucose-Capillary Latest Range: 70-99 mg/dL 409 (H) 811 (H) 914 (H) 154 (H) 224 (H) 210 (H) 157 (H)   Inpatient Diabetes Program Recommendations Correction (SSI): Please consider changing the frequency on Novolog correction to ACHS if patient is eating and tolerating diet without any issues.  Thanks, Orlando Penner, RN, BSN, CCRN Diabetes Coordinator Inpatient Diabetes Program [phone]

## 2012-11-02 NOTE — Clinical Social Work Placement (Signed)
Clinical Social Work Department CLINICAL SOCIAL WORK PLACEMENT NOTE 11/02/2012  Patient:  Timothy Chan, Timothy Chan  Account Number:  [id number] Admit date:  10/26/2012  Clinical Social Worker:  Derenda Fennel, LCSW  Date/time:  11/01/2012 01:25 PM  Clinical Social Work is seeking post-discharge placement for this patient at the following level of care:   SKILLED NURSING   (*CSW will update this form in Epic as items are completed)   11/01/2012  Patient/family provided with [facility] Department of Clinical Social Work's list of facilities offering this level of care within the geographic area requested by the patient (or if unable, by the patient's family).  11/01/2012  Patient/family informed of their freedom to choose among providers that offer the needed level of care, that participate in Medicare, Medicaid or managed care program needed by the patient, have an available bed and are willing to accept the patient.  11/01/2012  Patient/family informed of [facility]' ownership interest in [facility], as well as of the fact that they are under no obligation to receive care at this facility.  PASARR submitted to EDS on  PASARR number received from EDS on   FL2 transmitted to all facilities in geographic area requested by pt/family on  11/01/2012 FL2 transmitted to all facilities within larger geographic area on 11/01/2012  Patient informed that his/her managed care company has contracts with or will negotiate with  certain facilities, including the following:     Patient/family informed of bed offers received:  11/02/2012 Patient chooses bed at [facility] [place] Physician recommends and patient chooses bed at  [facility] [place]  Patient to be transferred to [facility] [place] on  11/02/2012 Patient to be transferred to facility by friend  The following physician request were entered in Epic:   Additional Comments: existing pasarr number   Derenda Fennel, LCSW [phone]

## 2012-11-02 NOTE — Progress Notes (Signed)
Subjective: Interval History: has no complaint of nausea or vomiting. His abdominal pain has improved. He has some weakness otherwise he does not have any complaints..  Objective: Vital signs in last 24 hours: Temp:  [97.9 F (36.6 C)-98.4 F (36.9 C)] 98.4 F (36.9 C) (04/24 0411) Pulse Rate:  [78-112] 104 (04/24 0411) Resp:  [16-20] 18 (04/24 0411) BP: (89-127)/(44-72) 119/72 mmHg (04/24 0411) SpO2:  [93 %-98 %] 98 % (04/24 0411) Weight:  [103.2 kg (227 lb 8.2 oz)] 103.2 kg (227 lb 8.2 oz) (04/24 0411) Weight change: -2.3 kg (-5 lb 1.1 oz)  Intake/Output from previous day: 04/23 0701 - 04/24 0700 In: 830 [P.O.:830] Out: 2809  Intake/Output this shift:    General appearance: alert, cooperative and no distress Resp: clear to auscultation bilaterally Cardio: regular rate and rhythm, S1, S2 normal, no murmur, click, rub or gallop GI: Distended but nontender and positive bowel sound. Extremities: extremities normal, atraumatic, no cyanosis or edema  Lab Results:  Recent Labs  11/01/12 0406 11/02/12 0558  WBC 6.1 5.2  HGB 8.8* 8.9*  HCT 25.8* 25.8*  PLT 57* 62*   BMET:  Recent Labs  10/31/12 0522 11/01/12 0406  NA 136 136  K 4.6 4.6  CL 99 100  CO2 26 26  GLUCOSE 176* 144*  BUN 37* 47*  CREATININE 5.12* 6.58*  CALCIUM 7.3* 7.7*   No results found for this basename: PTH,  in the last 72 hours Iron Studies: No results found for this basename: IRON, TIBC, TRANSFERRIN, FERRITIN,  in the last 72 hours  Studies/Results: No results found.  I have reviewed the patient's current medications.  Assessment/Plan: Problem #1 end-stage renal disease is status post hemodialysis yesterday pending 47 creatinine 6.58 potassium of 4.6. Problem #2 anemia his hemoglobin and hematocrit is 8.9 and 25.8 he's on Epogen H&H is stable Problem #3 peritonitis status post catheter removal presently a febrile and seems to be improving. Problem #4 hepatic encephalopathy Problem #5  history of diabetes Problem #6 thrombocytopenia etiology not clear. Presently platelet is low but stable Problem #7 history of chronic liver disease Problem #8 metabolic panel disease calcium was in acceptable range and phosphorus is slightly high is on a binder. Plan: We'll make arrangements for patient to get dialysis tomorrow If is going to be discharged we'll make it is an outpatient.   LOS: 7 days   Teneil Shiller S 11/02/2012,7:37 AM

## 2012-11-02 NOTE — Clinical Social Work Note (Signed)
CSW presented bed offers and pt chooses [facility]. They are aware of dialysis tomorrow at 11 in [place]. [facility] also notified of d/c today. Pt is arranging transport with a friend. D/C summary faxed.   Derenda Fennel, [place] [phone]

## 2012-11-02 NOTE — Progress Notes (Signed)
[facility] chart review completed.  

## 2012-11-02 NOTE — Progress Notes (Signed)
Patient report called to [facility]. Patient discharged to [facility] in stable condition.

## 2012-11-03 LAB — HEPARIN INDUCED THROMBOCYTOPENIA PNL
Patient O.D.: 0.166
UFH High Dose UFH H: 0 % Release
UFH SRA Result: NEGATIVE

## 2012-11-07 NOTE — Discharge Summary (Signed)
Physician Discharge Summary  [name]:[id number] DOB: Apr 18, 1951 DOA: 10/20/2012  PCP: Ignatius Specking., MD  Admit date: 10/20/2012 Discharge date: 10/24/12  PATIENT LEFT THE HOSPITAL AGAINST MEDICAL ADVICE  Discharge Diagnoses:  Principal Problem:   Hepatic encephalopathy Active Problems:   Alcoholic cirrhosis   Diabetes mellitus   End stage renal disease   ESRD on peritoneal dialysis   Acute blood loss anemia   Hyperammonemia   Altered mental status   GI bleed    Filed Weights   10/23/12 0440 10/24/12 0404 10/24/12 0453  Weight: 103.9 kg (229 lb 0.9 oz) 102.513 kg (226 lb) 102.513 kg (226 lb)    History of present illness:  Timothy Chan is an 62 y.o. male. Multiple medical problems including diabetes, end-stage renal disease on daily peritoneal dialysis, liver cirrhosis status post TIPS, recurrent episodes of hepatic encephalopathy maintained on lactulose, and Xifaxan, baseline hemoglobin greater than 10. He is "frequent flyer"at our emergency room but very often leaves AMA. He was admitted to [facility] April 7 for abdominal pain and anemia, focal colitis noted on CAT scan. He was seen by nephrologist and gastroenterologist did not feel that the colitis was significant cause of his symptoms, but left AMA the following day. He now presents to our emergency room with progressive weakness and confusion and hospitalist service was called to assist.  A 4 point drop is noted in his hemoglobin over the past month; his ammonia level is elevated above baseline.  At the time of evaluation patient is oriented only to himself, and believes he is at [facility]. He is unable to give me any reliable history, although the nurse informs me that he was able to consent to blood transfusion.   Hospital Course:  Patient was admitted to the hospital for confusion and GI bleeding. He was found to have hepatic encephalopathy. He was given frequent dosings of lactulose. His  mental status soon returned to baseline. There was concern of him developing spontaneous bacterial peritonitis due to his peritoneal dialysis catheter. He was started on cefotaxime. He was followed by gastroenterology. He did require transfusion of 3 units of PRBCs. He underwent endoscopy with results as below. He was followed by nephrology for hemodialysis needs. Recommendations were for continued monitoring for ongoing bleeding as well as continued hospital stay to complete course of antibiotics. Unfortunately, the patient is very adamant about leaving the hospital. It was explained to him that leaving the hospital before he is medically stable would put him at increased risk for deterioration in his condition and possible death. The patient understood these risks and left the hospital AGAINST MEDICAL ADVICE.  Consultants:  Nephrology,  Gastroenterology Procedures:  Hemodialysis EGD:No evidence of esophageal or gastric varices.  Mild portal gastropathy.  Multiple gastric antral vascular ectasia; 2 with active bleeding/oozing. Several of these lesions are ablated with APC including the ones that were bleeding.       Signed:  MEMON,JEHANZEB  [facility]

## 2012-11-09 ENCOUNTER — Telehealth: Payer: Self-pay | Admitting: Internal Medicine

## 2012-11-09 NOTE — Telephone Encounter (Signed)
Patient sees Dr. Karilyn Cota. EGD just 2 weeks ago. I suggest that questions be directed to his primary care physician, Dr. Sherril Croon or Dr. Karilyn Cota

## 2012-11-09 NOTE — Telephone Encounter (Signed)
Error

## 2012-11-09 NOTE — Telephone Encounter (Signed)
I have received 2 separate phone calls from family members calling to speak with RMR (one being his mother). This is in regards to patient being in rehab and not eating and telling his family he is "going to die" if they don't do something. First call was from mother asking to speak with RMR and to see what he could advise they do. Second call was from unknown person with the same phone number asking the same questions. [phone]

## 2012-11-09 NOTE — Telephone Encounter (Signed)
Tried to call pt's family- NA. Darl Pikes, if they call back please have them call [facility] office. Thanks.

## 2012-11-09 NOTE — Telephone Encounter (Signed)
I spoke with Arline Asp the sister of patient. She is aware that patient is a patient of N[facility] and that RMR would not be able to discuss plan of care regarding patient at [facility] and she should try talking with PCP. I also spoke with Lupita Leash at [place] office to let her be aware of situation.

## 2012-11-13 ENCOUNTER — Telehealth (INDEPENDENT_AMBULATORY_CARE_PROVIDER_SITE_OTHER): Payer: Self-pay | Admitting: *Deleted

## 2012-11-13 NOTE — Telephone Encounter (Signed)
Patient called asking if Dr. Karilyn Cota would give him something for his nerves. He has been placed at [facility] and his sister has taken everything out of his banking account. Advised Timothy Chan, he will need to see the provider that comes to [facility].

## 2012-11-13 NOTE — Telephone Encounter (Signed)
Dr.Rehman agrees that the Physician over [facility] needs to handle this

## 2012-11-21 ENCOUNTER — Emergency Department (HOSPITAL_COMMUNITY): Payer: Medicare Other

## 2012-11-21 ENCOUNTER — Inpatient Hospital Stay (HOSPITAL_COMMUNITY)
Admission: EM | Admit: 2012-11-21 | Discharge: 2012-11-25 | DRG: 377 | Disposition: A | Discharge status: Home Health | Payer: Medicare Other | Attending: Internal Medicine | Admitting: Internal Medicine

## 2012-11-21 ENCOUNTER — Encounter (HOSPITAL_COMMUNITY): Payer: Self-pay

## 2012-11-21 DIAGNOSIS — E118 Type 2 diabetes mellitus with unspecified complications: Secondary | ICD-10-CM | POA: Diagnosis present

## 2012-11-21 DIAGNOSIS — D631 Anemia in chronic kidney disease: Secondary | ICD-10-CM | POA: Diagnosis present

## 2012-11-21 DIAGNOSIS — K319 Disease of stomach and duodenum, unspecified: Secondary | ICD-10-CM | POA: Diagnosis present

## 2012-11-21 DIAGNOSIS — G8929 Other chronic pain: Secondary | ICD-10-CM | POA: Diagnosis present

## 2012-11-21 DIAGNOSIS — I120 Hypertensive chronic kidney disease with stage 5 chronic kidney disease or end stage renal disease: Secondary | ICD-10-CM | POA: Diagnosis present

## 2012-11-21 DIAGNOSIS — H3321 Serous retinal detachment, right eye: Secondary | ICD-10-CM

## 2012-11-21 DIAGNOSIS — K7030 Alcoholic cirrhosis of liver without ascites: Secondary | ICD-10-CM | POA: Diagnosis present

## 2012-11-21 DIAGNOSIS — G629 Polyneuropathy, unspecified: Secondary | ICD-10-CM

## 2012-11-21 DIAGNOSIS — K31819 Angiodysplasia of stomach and duodenum without bleeding: Secondary | ICD-10-CM | POA: Diagnosis present

## 2012-11-21 DIAGNOSIS — Z888 Allergy status to other drugs, medicaments and biological substances status: Secondary | ICD-10-CM

## 2012-11-21 DIAGNOSIS — R6510 Systemic inflammatory response syndrome (SIRS) of non-infectious origin without acute organ dysfunction: Secondary | ICD-10-CM

## 2012-11-21 DIAGNOSIS — T8571XD Infection and inflammatory reaction due to peritoneal dialysis catheter, subsequent encounter: Secondary | ICD-10-CM

## 2012-11-21 DIAGNOSIS — F1020 Alcohol dependence, uncomplicated: Secondary | ICD-10-CM | POA: Diagnosis present

## 2012-11-21 DIAGNOSIS — N2581 Secondary hyperparathyroidism of renal origin: Secondary | ICD-10-CM | POA: Diagnosis present

## 2012-11-21 DIAGNOSIS — I8510 Secondary esophageal varices without bleeding: Secondary | ICD-10-CM | POA: Diagnosis present

## 2012-11-21 DIAGNOSIS — D6959 Other secondary thrombocytopenia: Secondary | ICD-10-CM | POA: Diagnosis present

## 2012-11-21 DIAGNOSIS — Z886 Allergy status to analgesic agent status: Secondary | ICD-10-CM

## 2012-11-21 DIAGNOSIS — Z66 Do not resuscitate: Secondary | ICD-10-CM | POA: Diagnosis present

## 2012-11-21 DIAGNOSIS — I959 Hypotension, unspecified: Secondary | ICD-10-CM | POA: Diagnosis present

## 2012-11-21 DIAGNOSIS — N186 End stage renal disease: Secondary | ICD-10-CM | POA: Diagnosis present

## 2012-11-21 DIAGNOSIS — Z87891 Personal history of nicotine dependence: Secondary | ICD-10-CM

## 2012-11-21 DIAGNOSIS — K209 Esophagitis, unspecified without bleeding: Secondary | ICD-10-CM | POA: Diagnosis present

## 2012-11-21 DIAGNOSIS — D649 Anemia, unspecified: Secondary | ICD-10-CM

## 2012-11-21 DIAGNOSIS — K31811 Angiodysplasia of stomach and duodenum with bleeding: Principal | ICD-10-CM | POA: Diagnosis present

## 2012-11-21 DIAGNOSIS — K767 Hepatorenal syndrome: Secondary | ICD-10-CM | POA: Diagnosis present

## 2012-11-21 DIAGNOSIS — R4182 Altered mental status, unspecified: Secondary | ICD-10-CM

## 2012-11-21 DIAGNOSIS — E1165 Type 2 diabetes mellitus with hyperglycemia: Secondary | ICD-10-CM

## 2012-11-21 DIAGNOSIS — N039 Chronic nephritic syndrome with unspecified morphologic changes: Secondary | ICD-10-CM | POA: Diagnosis present

## 2012-11-21 DIAGNOSIS — D62 Acute posthemorrhagic anemia: Secondary | ICD-10-CM | POA: Diagnosis present

## 2012-11-21 DIAGNOSIS — E119 Type 2 diabetes mellitus without complications: Secondary | ICD-10-CM

## 2012-11-21 DIAGNOSIS — D696 Thrombocytopenia, unspecified: Secondary | ICD-10-CM | POA: Diagnosis present

## 2012-11-21 DIAGNOSIS — E876 Hypokalemia: Secondary | ICD-10-CM | POA: Diagnosis not present

## 2012-11-21 DIAGNOSIS — E7220 Disorder of urea cycle metabolism, unspecified: Secondary | ICD-10-CM

## 2012-11-21 DIAGNOSIS — K766 Portal hypertension: Secondary | ICD-10-CM | POA: Diagnosis present

## 2012-11-21 DIAGNOSIS — K7290 Hepatic failure, unspecified without coma: Secondary | ICD-10-CM

## 2012-11-21 DIAGNOSIS — K922 Gastrointestinal hemorrhage, unspecified: Secondary | ICD-10-CM | POA: Diagnosis present

## 2012-11-21 DIAGNOSIS — R109 Unspecified abdominal pain: Secondary | ICD-10-CM

## 2012-11-21 DIAGNOSIS — IMO0002 Reserved for concepts with insufficient information to code with codable children: Secondary | ICD-10-CM | POA: Diagnosis present

## 2012-11-21 DIAGNOSIS — Z992 Dependence on renal dialysis: Secondary | ICD-10-CM

## 2012-11-21 HISTORY — DX: Pneumonia, unspecified organism: J18.9

## 2012-11-21 LAB — CBC
HCT: 19.4 % — ABNORMAL LOW (ref 39.0–52.0)
Hemoglobin: 6.6 g/dL — CL (ref 13.0–17.0)
MCHC: 34 g/dL (ref 30.0–36.0)
MCV: 92.8 fL (ref 78.0–100.0)
RDW: 17.5 % — ABNORMAL HIGH (ref 11.5–15.5)
WBC: 6.5 10*3/uL (ref 4.0–10.5)

## 2012-11-21 LAB — BASIC METABOLIC PANEL
BUN: 22 mg/dL (ref 6–23)
Calcium: 7.6 mg/dL — ABNORMAL LOW (ref 8.4–10.5)
Creatinine, Ser: 4.63 mg/dL — ABNORMAL HIGH (ref 0.50–1.35)
GFR calc non Af Amer: 12 mL/min — ABNORMAL LOW (ref >90)
Glucose, Bld: 182 mg/dL — ABNORMAL HIGH (ref 70–99)
Sodium: 133 mEq/L — ABNORMAL LOW (ref 135–145)

## 2012-11-21 LAB — CBC WITH DIFFERENTIAL/PLATELET
Eosinophils Absolute: 0.1 10*3/uL (ref 0.0–0.7)
Eosinophils Relative: 2 % (ref 0–5)
Lymphs Abs: 0.7 10*3/uL (ref 0.7–4.0)
MCH: 31.7 pg (ref 26.0–34.0)
MCV: 95.2 fL (ref 78.0–100.0)
Monocytes Absolute: 0.8 10*3/uL (ref 0.1–1.0)
Monocytes Relative: 12 % (ref 3–12)
Platelets: 127 10*3/uL — ABNORMAL LOW (ref 150–400)
RBC: 1.89 MIL/uL — ABNORMAL LOW (ref 4.22–5.81)

## 2012-11-21 LAB — MRSA PCR SCREENING: MRSA by PCR: NEGATIVE

## 2012-11-21 LAB — GLUCOSE, CAPILLARY

## 2012-11-21 LAB — AMMONIA: Ammonia: 18 umol/L (ref 11–60)

## 2012-11-21 LAB — TROPONIN I: Troponin I: <0.3 ng/mL (ref <0.30)

## 2012-11-21 MED ORDER — PANTOPRAZOLE SODIUM 40 MG IV SOLR
40.0000 mg | Freq: Two times a day (BID) | INTRAVENOUS | Status: DC
  Administered 2012-11-21 – 2012-11-24 (×5): 40 mg via INTRAVENOUS
  Filled 2012-11-21 (×8): qty 40

## 2012-11-21 MED ORDER — TRAMADOL HCL 50 MG PO TABS
50.0000 mg | ORAL_TABLET | Freq: Four times a day (QID) | ORAL | Status: DC | PRN
  Administered 2012-11-22 – 2012-11-25 (×4): 50 mg via ORAL
  Filled 2012-11-21 (×3): qty 1

## 2012-11-21 MED ORDER — ACETAMINOPHEN 325 MG PO TABS: 650.0000 mg | ORAL_TABLET | Freq: Four times a day (QID) | ORAL | Status: DC | PRN

## 2012-11-21 MED ORDER — ONDANSETRON HCL 4 MG/2ML IJ SOLN: 4.0000 mg | Freq: Four times a day (QID) | INTRAMUSCULAR | Status: DC | PRN

## 2012-11-21 MED ORDER — SODIUM CHLORIDE 0.9 % IV BOLUS (SEPSIS)
500.0000 mL | Freq: Once | INTRAVENOUS | Status: AC
  Administered 2012-11-21: 500 mL via INTRAVENOUS

## 2012-11-21 MED ORDER — ALBUTEROL SULFATE (5 MG/ML) 0.5% IN NEBU
2.5000 mg | INHALATION_SOLUTION | RESPIRATORY_TRACT | Status: DC | PRN
  Administered 2012-11-23: 2.5 mg via RESPIRATORY_TRACT
  Filled 2012-11-21: qty 0.5

## 2012-11-21 MED ORDER — ONDANSETRON HCL 4 MG/2ML IJ SOLN: 4.0000 mg | Freq: Three times a day (TID) | INTRAMUSCULAR | Status: DC | PRN

## 2012-11-21 NOTE — ED Notes (Signed)
Multiple attempts to obtain blood by RN and lab phlebotomists. Unsuccessful. Blood obtained via IV after appropriate waste and sent to lab.[name]

## 2012-11-21 NOTE — ED Provider Notes (Signed)
History    This chart was scribed for Ward Givens, MD by Marlyne Beards, ED Scribe. The patient was seen in room APA16A/APA16A. Patient's care was started at 10:21 AM.    CSN: [id number]  Arrival date & time 11/21/12  0955   First MD Initiated Contact with Patient 11/21/12 1000      Chief Complaint  Patient presents with  . Weakness  . Shortness of Breath    (Consider location/radiation/quality/duration/timing/severity/associated sxs/prior treatment) The history is provided by the patient. No language interpreter was used.   HPI Comments: VERDUN RACKLEY is a 62 y.o. male who presents to the Emergency Department complaining of moderate constant weakness with associated SOB starting this morning. Pt reports that he was in dialysis earlier this morning when sx's started. Pt also reports that he had some nausea and vomiting (2-3 episodes) prior to dialysis so they administered 2mg  of Zofran before dialysis. Pt states that he did not eat before dialysis. He states  he vomited with the coffee he had drank at dialysis. He had 2 1/2 hours of dialysis and had 1 1/2 hrs to go before he was removed.   He states the rehabilitation facility is not providing him with food that fits his renal diet so he has refused to eat it. He reports that he has been losing weight due to the fact. Pt denies swelling in extremities, chest pain, abdominal pain, fever, chills, cough, diarrhea, and any other associated symptoms. Patient complains of some shortness of breath yesterday and today. He denies swelling in his legs. Some reports patient has been getting anemia from uncertain etiology.  Patient has end-stage renal disease and he gets dialysis on Tuesday (today), Thursdays, and Saturday.   Pt states he has been in rehab over a couple weeks now. Pt was living alone before rehab. Pt was admitted into rehab due to being very weak with pneumonia and unable to ambulate. Pt states he walks with a cane when able. Pt was  hypotensive upon arrival to the ED with a CBG of 206.  Pt has been a dialysis pt for 3 years.   Dr. Fausto Skillern is pt's Dialysis doctor. Dr. Sherril Croon is pt's PCP.   Past Medical History  Diagnosis Date  . Rectal bleeding   . Dialysis care   . GI bleed   . Hypertension   . Esophageal varices   . Alcoholic liver disease   . Hepatic encephalopathy   . Ascites   . Diabetes mellitus   . Detached retina   . Renal disorder   . Pneumonia     Past Surgical History  Procedure Laterality Date  . Esophagogastroduodenoscopy  12/31/2010    EGD [facility] THERAPY  . Esophagogastroduodenoscopy  05/31/2012E    GD [facility] ABLATION  . Small bowel givens  12/01/2010  . Colonoscopy  09/07/2010  . Esophagogastroduodenoscopy  09/05/2010    OUTLAW  . Lung surgery  6/11    [place]  . Esophagogastroduodenoscopy  03/26/2011    Procedure: ESOPHAGOGASTRODUODENOSCOPY (EGD);  Surgeon: Malissa Hippo, MD;  Location: [facility];  Service: Endoscopy;  Laterality: N/A;  8:30 am  . Hot hemostasis  03/26/2011    Procedure: HOT HEMOSTASIS (ARGON PLASMA COAGULATION/BICAP);  Surgeon: Malissa Hippo, MD;  Location: [facility];  Service: Endoscopy;  Laterality: N/A;  . Stent in liver    . Esophagogastroduodenoscopy N/A 10/24/2012    Procedure: ESOPHAGOGASTRODUODENOSCOPY (EGD);  Surgeon: Malissa Hippo, MD;  Location: [facility];  Service:  Endoscopy;  Laterality: N/A;    No family history on file.  History  Substance Use Topics  . Smoking status: Former Smoker -- 0.50 packs/day    Types: Cigarettes    Quit date: 08/11/2011  . Smokeless tobacco: Former Neurosurgeon    Quit date: 05/12/2012  . Alcohol Use: No     Comment: denies   Lives alone, but currently in rehabilitation or weakness after pneumonia Uses a wheelchair   Review of Systems  Constitutional: Positive for fatigue.  Respiratory: Positive for shortness of breath.   Gastrointestinal: Positive for nausea and vomiting.  All other systems reviewed  and are negative.    Allergies  Tylenol and Morphine and related  Home Medications   Current Outpatient Rx  Name  Route  Sig  Dispense  Refill  . albuterol (PROVENTIL) (5 MG/ML) 0.5% nebulizer solution   Nebulization   Take 0.5 mLs (2.5 mg total) by nebulization every 4 (four) hours as needed for wheezing or shortness of breath.   20 mL      . ciprofloxacin (CIPRO) 500 MG tablet   Oral   Take 1 tablet (500 mg total) by mouth daily. indefinately for SBP prophylaxis         . Dextromethorphan-Guaifenesin (MUCINEX DM) 30-600 MG TB12   Oral   Take 1 tablet by mouth 2 (two) times daily. For a week, then as needed for cough and congestion   28 each   0   . fluconazole (DIFLUCAN) 200 MG tablet   Oral   Take 1 tablet (200 mg total) by mouth once. On 4/25 only         . insulin aspart (NOVOLOG) 100 UNIT/ML injection   Subcutaneous   Inject 0-5 Units into the skin at bedtime.   1 vial      . insulin aspart (NOVOLOG) 100 UNIT/ML injection   Subcutaneous   Inject 0-9 Units into the skin 3 (three) times daily with meals.   1 vial      . lactulose (CHRONULAC) 10 GM/15ML solution   Oral   Take 30 mLs (20 g total) by mouth 2 (two) times daily.   240 mL      . rifaximin (XIFAXAN) 550 MG TABS   Oral   Take 275 mg by mouth 2 (two) times daily. Patient takes 1/2 pill in the morning and 1/2 pill at night         . sevelamer carbonate (RENVELA) 800 MG tablet   Oral   Take 2 tablets (1,600 mg total) by mouth 3 (three) times daily with meals.         . traMADol (ULTRAM) 50 MG tablet   Oral   Take 1 tablet (50 mg total) by mouth every 6 (six) hours as needed. For pain   20 tablet   0     BP 75/40  Pulse 110  Temp(Src) 97.8 F (36.6 C) (Oral)  Resp 20  SpO2 98%  Vital signs normal except for hypotension and tachycardia   Physical Exam  Nursing note and vitals reviewed. Constitutional: He is oriented to person, place, and time.  Non-toxic appearance. He does  not appear ill. No distress.  Pt is very weak and frail.  HENT:  Head: Normocephalic and atraumatic.  Right Ear: External ear normal.  Left Ear: External ear normal.  Nose: Nose normal. No mucosal edema or rhinorrhea.  Mouth/Throat: Uvula is midline, oropharynx is clear and moist and mucous membranes are normal. No dental abscesses  or edematous.  Eyes: Conjunctivae and EOM are normal. Pupils are equal, round, and reactive to light.  Neck: Normal range of motion and full passive range of motion without pain. Neck supple.  Cardiovascular: Normal heart sounds.  Exam reveals no gallop and no friction rub.   No murmur heard. Pt is tachycardic (112).  Pulmonary/Chest: Effort normal and breath sounds normal. No respiratory distress. He has no wheezes. He has no rhonchi. He has no rales. He exhibits no tenderness and no crepitus.  Abdominal: Soft. Normal appearance and bowel sounds are normal. He exhibits no distension. There is no tenderness. There is no rebound and no guarding.  Musculoskeletal: Normal range of motion. He exhibits edema. He exhibits no tenderness.  Appears diffusely weak  Neurological: He is alert and oriented to person, place, and time. He has normal strength. No cranial nerve deficit.  Skin: Skin is warm, dry and intact. No rash noted. No erythema. There is pallor.  Sclera and palms are buried pale. Patient is noted to have a lot of bruising on his arms. He also is noted to have diffuse edema of his arms.  Psychiatric: His speech is normal and behavior is normal. His mood appears not anxious.  Flat affect    ED Course  Procedures (including critical care time)  Medications  sodium chloride 0.9 % bolus 500 mL (0 mLs Intravenous Stopped 11/21/12 1229)    DIAGNOSTIC STUDIES: Oxygen Saturation is 98% on room air, normal by my interpretation.    COORDINATION OF CARE: 11:28 AM Discussed ED treatment with pt and pt agrees.   12:47 PM Discussed results with pt and his current  anemic status. Pt requesting an ammonia level, states he has cirrhoisis.  Son told nursing staff patient has hx of GI bleeding.    Pt is prepared for transfusion for his anemia and hypotension  14:35 Dr Karilyn Cota, hospitalist, states patient needs step-down and no ICU or step-down beds available today, no discharges pending. Will need to go to [facility].  15:14 Dr Gwenlyn Perking, Hospitalist at [facility], accepts in transfer to step-down, team 10  Laboratory results  Results for orders placed during the hospital encounter of 11/21/12  CBC WITH DIFFERENTIAL      Result Value Range   WBC 6.4  4.0 - 10.5 K/uL   RBC 1.89 (*) 4.22 - 5.81 MIL/uL   Hemoglobin 6.0 (*) 13.0 - 17.0 g/dL   HCT 86.5 (*) 78.4 - 69.6 %   MCV 95.2  78.0 - 100.0 fL   MCH 31.7  26.0 - 34.0 pg   MCHC 33.3  30.0 - 36.0 g/dL   RDW 29.5 (*) 28.4 - 13.2 %   Platelets 127 (*) 150 - 400 K/uL   Neutrophils Relative % 75  43 - 77 %   Neutro Abs 4.8  1.7 - 7.7 K/uL   Lymphocytes Relative 11 (*) 12 - 46 %   Lymphs Abs 0.7  0.7 - 4.0 K/uL   Monocytes Relative 12  3 - 12 %   Monocytes Absolute 0.8  0.1 - 1.0 K/uL   Eosinophils Relative 2  0 - 5 %   Eosinophils Absolute 0.1  0.0 - 0.7 K/uL   Basophils Relative 1  0 - 1 %   Basophils Absolute 0.0  0.0 - 0.1 K/uL  BASIC METABOLIC PANEL      Result Value Range   Sodium 133 (*) 135 - 145 mEq/L   Potassium 2.9 (*) 3.5 - 5.1 mEq/L   Chloride 95 (*)  96 - 112 mEq/L   CO2 26  19 - 32 mEq/L   Glucose, Bld 182 (*) 70 - 99 mg/dL   BUN 22  6 - 23 mg/dL   Creatinine, Ser 2.13 (*) 0.50 - 1.35 mg/dL   Calcium 7.6 (*) 8.4 - 10.5 mg/dL   GFR calc non Af Amer 12 (*) >90 mL/min   GFR calc Af Amer 14 (*) >90 mL/min  PRO B NATRIURETIC PEPTIDE      Result Value Range   Pro B Natriuretic peptide (BNP) 1167.0 (*) 0 - 125 pg/mL  TROPONIN I      Result Value Range   Troponin I <0.30  <0.30 ng/mL  AMMONIA      Result Value Range   Ammonia 18  11 - 60 umol/L  OCCULT BLOOD, POC DEVICE      Result Value Range    Fecal Occult Bld POSITIVE (*) NEGATIVE  PREPARE RBC (CROSSMATCH)      Result Value Range   Order Confirmation ORDER PROCESSED BY BLOOD BANK    TYPE AND SCREEN      Result Value Range   ABO/RH(D) B NEG     Antibody Screen NEG     Sample Expiration 11/24/2012     Unit Number [id number]     Blood Component Type RED CELLS,LR     Unit division 00     Status of Unit ISSUED     Transfusion Status OK TO TRANSFUSE     Crossmatch Result Compatible     Unit Number [id number]     Blood Component Type RED CELLS,LR     Unit division 00     Status of Unit ALLOCATED     Transfusion Status OK TO TRANSFUSE     Crossmatch Result Compatible     Laboratory interpretation all normal except anemia, positive Hemoccult ,hypokalemia which will correct   Comprehensive medical normal except for sodium 133 low, low potassium 2.9, low chloride 95, glucose high 182, creatinine 4.63 high but expected with chronic renal disease, alkaline phosphatase 122 which is high, and albumin 1.5 with total protein of 5.8 which are both low and consistent with malnutrition  Troponin less than 0.3 which is normal  BNP is 1167 which is high  Calcium is 7.6 which is low but consistent with low albumin  CBC shows white count 6.4, hemoglobin 6.0, platelet count 127,000 which is mildly low, differential 75 segs, 11% lymphocytes, 12% monocytes, 2% eosinophils.  Hemoccult + with black stools  Dg Chest Portable 1 View  11/21/2012  *RADIOLOGY REPORT*  Clinical Data: Weakness and shortness of breath.  PORTABLE CHEST - 1 VIEW  Comparison: Single view of the chest 10/29/2012 and 10/20/2012.  Findings: Right lower lobe airspace disease seen on the most recent study has improved.  There is patchy airspace disease in the left lung base.  No pneumothorax or pleural fluid is identified.  Heart size is upper normal.  IMPRESSION: Improved right lower lobe airspace disease with patchy airspace opacity in the left lung base which could be  due to atelectasis or pneumonia.   Original Report Authenticated By: Holley Dexter, M.D.      Ct Abdomen Pelvis W Contrast  10/27/2012 IMPRESSION:  1.  Previously noted changes suggestive of focal colitis have resolved. 2.  Apparent irregular wall thickening along much of the jejunum, possibly reflecting an acute infectious process. 3.  Small to moderate volume ascites likely reflects peritoneal dialysis, slightly decreased in volume from the prior study.  Diffuse mesenteric edema may be reactive secondary to surrounding fluid. 4.  Small right-sided pleural effusion, with loculation and pleural thickening.  Rounded right lower lobe airspace disease appears to reflect rounded atelectasis.  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 6.  Patency of the TIPS is not well assessed on this study; cirrhotic change again noted.  2.6 cm heterogeneous lesion within the right hepatic lobe is somewhat better characterized than on prior studies.  Though this could simply be postoperative, given adjacent underlying calcification, dynamic liver protocol MRI or CT could be considered for further evaluation.  MRI should only be performed if the patient can hold his breath for the study. 7.  Chronic bilateral renal atrophy noted, with scattered tiny bilateral renal cysts. 8.  Diffuse calcification along the abdominal aorta and its branches, including at the origins of both renal arteries.   Original Report Authenticated By: Tonia Ghent, M.D.    Dg Chest Port 1 View  10/29/2012 IMPRESSION: Right lower lobe pneumonia.  Small right effusion.   Original Report Authenticated By: Charlett Nose, M.D.    Dg Chest Portable 1 View  10/26/2012  * Cardiomegaly.  Stable right basilar scarring.  No acute infiltrate or pulmonary edema.   Original Report Authenticated By: Natasha Mead, M.D.     Date: 11/21/2012  Rate: 112  Rhythm: sinus tachycardia  QRS Axis: normal   Intervals: normal  ST/T Wave abnormalities: normal  Conduction Disutrbances:none  Narrative Interpretation:   Old EKG Reviewed: none available     1. Anemia   2. GI bleeding   3. End stage renal disease on dialysis   4. Hypotension       CRITICAL CARE Performed by: Devoria Albe L Total critical care time: 31 min  Critical care time was exclusive of separately billable procedures and treating other patients. Critical care was necessary to treat or prevent imminent or life-threatening deterioration. Critical care was time spent personally by me on the following activities: development of treatment plan with patient and/or surrogate as well as nursing, discussions with consultants, evaluation of patient's response to treatment, examination of patient, obtaining history from patient or surrogate, ordering and performing treatments and interventions, ordering and review of laboratory studies, ordering and review of radiographic studies, pulse oximetry and re-evaluation of patient's condition.    MDM  patient given 500 cc fluid bolus. I tried to not give him a lot of fluids since he requires blood transfusion and he will beginning a lot of fluids with it. Patient states he has minimal urinary output and he is also on dialysis. Patient has history of GI bleeding and states he's been having black stools for the past month. His exam is consistent with anemia with very pale sclera and palms. Patient also has very pale color of his skin. His hypokalemia was not treated, this will correct on its own.    I personally performed the services described in this documentation, which was scribed in my presence. The recorded information has been reviewed and considered.  Devoria Albe, MD, [facility]    Ward Givens, MD 11/21/12 [phone]

## 2012-11-21 NOTE — ED Notes (Signed)
Patient is resting comfortably. 

## 2012-11-21 NOTE — H&P (Addendum)
[facility] History and Physical  Timothy Chan ZOX:[id number] DOB: June 26, 1951 DOA: 11/21/2012  Referring physician: EDP at [facility] PCP: Ignatius Specking., MD  Specialists: GI Dr.Rahman Chief Complaint: weakness, vomiting  HPI: Timothy Chan is a 62 y.o. male with PMH of ESRD previously on PD now on HD TTS, during his dialysis treatment today felt very weak, sick, nauseous and was sent to the ER 1 hour before completing his HD treatment. Upon evaluation in ER he was briefly hypotensive, improved with NS bolus, Was found to have Hb of 6.0, drop from 8.9, 2weeks prior, hemoccult positive. Pt reports h/o black stools off and on for past 2 weeks. denies NSAID, ETOH use. Last EGD by Dr.Rahman 4/11 noted : Multiple gastric antral vascular ectasia; 2 with active bleeding/oozing. Several of these lesions were ablated with APC then.    Review of Systems:  positive for weakness, nausea, vomiting The patient denies anorexia, fever, weight loss,, vision loss, decreased hearing, hoarseness, chest pain, syncope, dyspnea on exertion, peripheral edema, balance deficits, hemoptysis, abdominal pain, melena, hematochezia, severe indigestion/heartburn, hematuria, incontinence, genital sores, muscle weakness, suspicious skin lesions, transient blindness, difficulty walking, depression, unusual weight change, abnormal bleeding, enlarged lymph nodes, angioedema, and breast masses.    Past Medical History  Diagnosis Date  . Rectal bleeding   . Dialysis care   . GI bleed   . Hypertension   . Esophageal varices   . Alcoholic liver disease   . Hepatic encephalopathy   . Ascites   . Diabetes mellitus   . Detached retina   . Renal disorder   . Pneumonia    Past Surgical History  Procedure Laterality Date  . Esophagogastroduodenoscopy  12/31/2010    EGD [facility] THERAPY  . Esophagogastroduodenoscopy  05/31/2012E    GD APC ABLATION  . Small bowel givens  12/01/2010  . Colonoscopy  09/07/2010  .  Esophagogastroduodenoscopy  09/05/2010    OUTLAW  . Lung surgery  6/11    [place]  . Esophagogastroduodenoscopy  03/26/2011    Procedure: ESOPHAGOGASTRODUODENOSCOPY (EGD);  Surgeon: Malissa Hippo, MD;  [facility];  Service: Endoscopy;  Laterality: N/A;  8:30 am  . Hot hemostasis  03/26/2011    Procedure: HOT HEMOSTASIS (ARGON PLASMA COAGULATION/BICAP);  Surgeon: Malissa Hippo, MD;  Location: [facility];  Service: Endoscopy;  Laterality: N/A;  . Stent in liver    . Esophagogastroduodenoscopy N/A 10/24/2012    Procedure: ESOPHAGOGASTRODUODENOSCOPY (EGD);  Surgeon: Malissa Hippo, MD;  Location: [facility];  Service: Endoscopy;  Laterality: N/A;   Social History:  reports that he quit smoking about 15 months ago. His smoking use included Cigarettes. He smoked 0.50 packs per day. He quit smokeless tobacco use about 6 months ago. He reports that he does not drink alcohol or use illicit drugs. Lives at home by himself  Allergies  Allergen Reactions  . Tylenol (Acetaminophen) Other (See Comments)    cirrhosis  . Morphine And Related Itching    Family history Reviewed, states that parents are diseased, cause unknown  Prior to Admission medications   Medication Sig Start Date End Date Taking? Authorizing Provider  albuterol (PROVENTIL) (5 MG/ML) 0.5% nebulizer solution Take 0.5 mLs (2.5 mg total) by nebulization every 4 (four) hours as needed for wheezing or shortness of breath. 11/02/12  Yes Christiane Ha, MD  b complex-vitamin c-folic acid (NEPHRO-VITE) 0.8 MG TABS Take 0.8 mg by mouth at bedtime.   Yes Historical Provider, MD  furosemide (LASIX)  80 MG tablet Take 80 mg by mouth 2 (two) times daily.   Yes Historical Provider, MD  glipiZIDE (GLUCOTROL) 5 MG tablet Take 5 mg by mouth 2 (two) times daily.   Yes Historical Provider, MD  lactulose (CHRONULAC) 10 GM/15ML solution Take 30 mLs (20 g total) by mouth 2 (two) times daily. 11/02/12  Yes Christiane Ha,  MD  lanthanum (FOSRENOL) 1000 MG chewable tablet Chew 1,000 mg by mouth 4 (four) times daily -  with meals and at bedtime.   Yes Historical Provider, MD  ondansetron (ZOFRAN) 4 MG tablet Take 4 mg by mouth every 8 (eight) hours as needed for nausea.   Yes Historical Provider, MD  potassium chloride (K-DUR) 10 MEQ tablet Take 10 mEq by mouth 3 (three) times a week. Patient takes on tues.,thurs.,sat.   Yes Historical Provider, MD  traMADol (ULTRAM) 50 MG tablet Take 50 mg by mouth every 6 (six) hours as needed for pain.   Yes Historical Provider, MD   Physical Exam: Filed Vitals:   11/21/12 1540 11/21/12 1624 11/21/12 1645 11/21/12 1849  BP: 95/40 98/51 104/53 136/65  Pulse: 107 106 106   Temp:  97.8 F (36.6 C) 97.9 F (36.6 C) 98.3 F (36.8 C)  TempSrc:  Oral Oral Oral  Resp: 18 19 18    Height:    6\' 3"  (1.905 m)  Weight:    102.1 kg (225 lb 1.4 oz)  SpO2:  100%       General:  AAOx3  HEENT: PERRLA, EOMI  Cardiovascular: S1S2/RRR  Respiratory: CTAB  Abdomen: soft, NT, BS present  Skin: no rashes  Musculoskeletal: no edema c/c, LUE AVF  Psychiatric: appropriate mood and affect  Neurologic: non focal  Labs on Admission:  Basic Metabolic Panel:  Recent Labs Lab 11/21/12 1025  NA 133*  K 2.9*  CL 95*  CO2 26  GLUCOSE 182*  BUN 22  CREATININE 4.63*  CALCIUM 7.6*   Liver Function Tests: No results found for this basename: AST, ALT, ALKPHOS, BILITOT, PROT, ALBUMIN,  in the last 168 hours No results found for this basename: LIPASE, AMYLASE,  in the last 168 hours  Recent Labs Lab 11/21/12 1445  AMMONIA 18   CBC:  Recent Labs Lab 11/21/12 1025  WBC 6.4  NEUTROABS 4.8  HGB 6.0*  HCT 18.0*  MCV 95.2  PLT 127*   Cardiac Enzymes:  Recent Labs Lab 11/21/12 1145  TROPONINI <0.30    BNP (last 3 results)  Recent Labs  11/21/12 1145  PROBNP 1167.0*   CBG: No results found for this basename: GLUCAP,  in the last 168 hours  Radiological Exams  on Admission: Dg Chest Portable 1 View  11/21/2012  *RADIOLOGY REPORT*  Clinical Data: Weakness and shortness of breath.  PORTABLE CHEST - 1 VIEW  Comparison: Single view of the chest 10/29/2012 and 10/20/2012.  Findings: Right lower lobe airspace disease seen on the most recent study has improved.  There is patchy airspace disease in the left lung base.  No pneumothorax or pleural fluid is identified.  Heart size is upper normal.  IMPRESSION: Improved right lower lobe airspace disease with patchy airspace opacity in the left lung base which could be due to atelectasis or pneumonia.   Original Report Authenticated By: Holley Dexter, M.D.     Assessment/Plan  1. Upper GI Bleed -H/o Gave's disease, recent EGD and APC cautery per Dr.Rehman, at [facility] on 4/14 -Check CBC Q8 -transfuse 2 units PRBC  -IV  PPI Q12 -check PT/INR -Gi consulted d/w dr.Hung, NPO after midnight -clears for now  2. Anemia: -acute blood loss and chronic disease -transfuse as noted above  3. ESRD: on HD TTS -renal notified d/w Dr.Patel - did not complete HD today  4. DM: -hold PO meds,  -SSI  5. Hypokalemia:  -will not replace since expect this to improve with transfusion  6. H/o liver cirrhosis/choronci thrombocytopenia -check Coags  7. CXR with LLL pnuemonia vs atelectasis -asymptomatic,  -monitor clinically  DVT proph: SCDs  Code Status: DNR Family Communication: d/w PoA Kristie Cowman via telephone, [phone] Disposition Plan: admit to SDU  Time spent:  [facility] Pager [phone]  If 7PM-7AM, please contact night-coverage [email or web address] Password [facility] 11/21/2012, 7:11 PM

## 2012-11-21 NOTE — Progress Notes (Signed)
CRITICAL VALUE ALERT  Critical value received:  hgb 6.6  Date of notification:  11-21-12  Time of notification:  2230  Critical value read back:yes  Nurse who received alert:  Maximino Greenland RN  MD notified (1st page):  Donnamarie Poag  Time of first page:  2245  MD notified (2nd page):  Time of second page:  Responding MD:  Donnamarie Poag  Time MD responded:  2245

## 2012-11-21 NOTE — ED Notes (Signed)
Pt reports was in dialysis this morning and started complaining of weakness and SOB.  Denies pain.  Reports had some n/v prior to dialysis so they administered 2mg  zofran prior to dialysis.  EMS got bp 94/47.  Reports pt received 2h of dialysis and had 1hr and 24 min left.  Reports pulled off  1 liter.  Pt alert and oriented, c/o generalized weakness.  CBG 206.  o2sat was 96% on room air.

## 2012-11-21 NOTE — Progress Notes (Signed)
Called from [facility] ED (Dr. Lynelle Doctor) Patient with increase fatigue, weakness and SOB; also with melanotic stools for the last week or so. Found with positive FOBT and Hgb level at 6.0. Had significant PMH Of ESRD, alcoholic cirrhosis and diabetes. In the ED was found with BP in the mid 70's to mid 80's; needing stepdown bed (which is not available at [facility]). Will need renal consult and GI consult.   Patient accepted to [facility], Team 10   Timothy Chan [phone]

## 2012-11-21 NOTE — ED Notes (Signed)
Hemocult completed on patient. Grossly positive. MD aware. While performing hemacult, patient reports informing nursing home of bloody stools "for awhile". Patient states his stools have been black and green. Stool noted to be black upon assessment. MD informed of this.

## 2012-11-21 NOTE — ED Notes (Signed)
CRITICAL VALUE ALERT  Critical value received:  Hemoglobin 6.0  Date of notification:  11/21/2012  Time of notification:  1237  Critical value read back:yes  Nurse who received alert:  Hardie Pulley, RN  MD notified (1st page):  Devoria Albe  Time of first page:  1237  MD notified (2nd page):  Time of second page:  Responding MD:  Devoria Albe  Time MD responded:  [phone]

## 2012-11-21 NOTE — Progress Notes (Signed)
Patient arrived from [facility] hospital thru [facility], report given by [facility], Patient alert and oriented. Arrived 136/65, 98.3,107, 100% on 2 L [place], patient arrived with PRBC running thru PIV. MD paged, will continue to monitor.

## 2012-11-21 NOTE — ED Notes (Signed)
[facility] at bedside. Attempting to call report to [phone] at [facility]. Holding.

## 2012-11-21 NOTE — ED Notes (Signed)
Attempted to call report to 3300 at [facility] x 2. Unable to reach nursing staff. [facility] will give bedside report.

## 2012-11-21 NOTE — ED Notes (Signed)
Patient left ED at this time with [facility] staff. No distress upon departure. 

## 2012-11-22 ENCOUNTER — Encounter (HOSPITAL_COMMUNITY)
Admission: EM | Disposition: A | Discharge status: Home Health | Payer: Self-pay | Source: Home / Self Care | Attending: Internal Medicine

## 2012-11-22 DIAGNOSIS — K31819 Angiodysplasia of stomach and duodenum without bleeding: Secondary | ICD-10-CM

## 2012-11-22 DIAGNOSIS — E1165 Type 2 diabetes mellitus with hyperglycemia: Secondary | ICD-10-CM

## 2012-11-22 DIAGNOSIS — D649 Anemia, unspecified: Secondary | ICD-10-CM

## 2012-11-22 HISTORY — PX: ESOPHAGOGASTRODUODENOSCOPY: SHX5428

## 2012-11-22 LAB — CBC
HCT: 23.2 % — ABNORMAL LOW (ref 39.0–52.0)
HCT: 21.7 % — ABNORMAL LOW (ref 39.0–52.0)
Hemoglobin: 7.9 g/dL — ABNORMAL LOW (ref 13.0–17.0)
Hemoglobin: 7.3 g/dL — ABNORMAL LOW (ref 13.0–17.0)
MCH: 30.6 pg (ref 26.0–34.0)
MCH: 30.3 pg (ref 26.0–34.0)
MCHC: 34.1 g/dL (ref 30.0–36.0)
MCV: 89.9 fL (ref 78.0–100.0)
MCV: 90 fL (ref 78.0–100.0)
RBC: 2.41 MIL/uL — ABNORMAL LOW (ref 4.22–5.81)

## 2012-11-22 LAB — COMPREHENSIVE METABOLIC PANEL
ALT: 14 U/L (ref 0–53)
Albumin: 1.2 g/dL — ABNORMAL LOW (ref 3.5–5.2)
Alkaline Phosphatase: 103 U/L (ref 39–117)
Potassium: 3.5 mEq/L (ref 3.5–5.1)
Sodium: 133 mEq/L — ABNORMAL LOW (ref 135–145)
Total Protein: 5.6 g/dL — ABNORMAL LOW (ref 6.0–8.3)

## 2012-11-22 LAB — GLUCOSE, CAPILLARY: Glucose-Capillary: 235 mg/dL — ABNORMAL HIGH (ref 70–99)

## 2012-11-22 LAB — PROTIME-INR: Prothrombin Time: 15.6 seconds — ABNORMAL HIGH (ref 11.6–15.2)

## 2012-11-22 SURGERY — EGD (ESOPHAGOGASTRODUODENOSCOPY)
Anesthesia: Moderate Sedation

## 2012-11-22 MED ORDER — SODIUM CHLORIDE 0.9 % IJ SOLN
INTRAMUSCULAR | Status: AC
  Administered 2012-11-22: 10 mL
  Filled 2012-11-22: qty 10

## 2012-11-22 MED ORDER — LIDOCAINE VISCOUS 2 % MT SOLN
OROMUCOSAL | Status: AC
  Filled 2012-11-22: qty 15

## 2012-11-22 MED ORDER — MIDAZOLAM HCL 5 MG/ML IJ SOLN
INTRAMUSCULAR | Status: AC
  Filled 2012-11-22: qty 1

## 2012-11-22 MED ORDER — INSULIN ASPART 100 UNIT/ML ~~LOC~~ SOLN: 0.0000 [IU] | SUBCUTANEOUS | Status: DC | PRN

## 2012-11-22 MED ORDER — DEXTROSE 50 % IV SOLN: 50.0000 mL | Freq: Once | INTRAVENOUS | Status: AC | PRN

## 2012-11-22 MED ORDER — FENTANYL CITRATE 0.05 MG/ML IJ SOLN
INTRAMUSCULAR | Status: DC | PRN
  Administered 2012-11-22: 25 ug via INTRAVENOUS

## 2012-11-22 MED ORDER — DEXTROSE 50 % IV SOLN: 25.0000 mL | Freq: Once | INTRAVENOUS | Status: AC | PRN

## 2012-11-22 MED ORDER — GLUCOSE 40 % PO GEL: 1.0000 | ORAL | Status: DC | PRN

## 2012-11-22 MED ORDER — MIDAZOLAM HCL 10 MG/2ML IJ SOLN
INTRAMUSCULAR | Status: DC | PRN
  Administered 2012-11-22: 1 mg via INTRAVENOUS
  Administered 2012-11-22: 2 mg via INTRAVENOUS

## 2012-11-22 MED ORDER — SODIUM CHLORIDE 0.9 % IV SOLN
INTRAVENOUS | Status: DC
  Administered 2012-11-22: 500 mL via INTRAVENOUS

## 2012-11-22 MED ORDER — FENTANYL CITRATE 0.05 MG/ML IJ SOLN
INTRAMUSCULAR | Status: AC
  Filled 2012-11-22: qty 2

## 2012-11-22 MED ORDER — DOXERCALCIFEROL 4 MCG/2ML IV SOLN
3.0000 ug | INTRAVENOUS | Status: DC
  Filled 2012-11-22 (×2): qty 2

## 2012-11-22 MED ORDER — LIDOCAINE VISCOUS 2 % MT SOLN
OROMUCOSAL | Status: DC | PRN
  Administered 2012-11-22: 20 mL via OROMUCOSAL

## 2012-11-22 MED ORDER — DARBEPOETIN ALFA-POLYSORBATE 150 MCG/0.3ML IJ SOLN
150.0000 ug | INTRAMUSCULAR | Status: DC
  Filled 2012-11-22: qty 0.3

## 2012-11-22 MED ORDER — SODIUM CHLORIDE 0.9 % IV SOLN
125.0000 mg | Freq: Once | INTRAVENOUS | Status: AC
  Administered 2012-11-23: 125 mg via INTRAVENOUS
  Filled 2012-11-22 (×2): qty 10

## 2012-11-22 NOTE — Progress Notes (Signed)
INITIAL NUTRITION ASSESSMENT  DOCUMENTATION CODES Per approved criteria  -Not Applicable   INTERVENTION:  Advance diet as medically appropriate RD to follow for nutrition care plan, add supplements accordingly  NUTRITION DIAGNOSIS: Increased nutrient needs related to ESRD, skin integrity as evidenced by estimated nutrition needs  Goal: Oral intake with meals to meet >/= 90% of estimated nutrition needs  Monitor:  PO intake, desire for supplements, weight, labs, I/O's  Reason for Assessment: Malnutrition Screening Tool Report  62 y.o. male  Admitting Dx: weakness, vomiting   ASSESSMENT: Patient with PMH of ESRD previously on PD now on HD TTS, during his dialysis treatment felt very weak, sick, nauseous and was sent to ER; upon evaluation he was briefly hypotensive, found to have Hb of 6.0, drop from 8.9; reported h/o black stools off and on for the past 2 weeks.  Patient reports his appetite has been decreased PTA; was residing at [facility]; states he didn't really care for the food there; also reports a 10-12 lb weight loss in the past 2-3 weeks; per weight records, patient's weight has been stable; would benefit from addition of supplements when able.  Height: Ht Readings from Last 1 Encounters:  11/21/12 6\' 3"  (1.905 m)    Weight: Wt Readings from Last 1 Encounters:  11/21/12 225 lb 1.4 oz (102.1 kg)    Ideal Body Weight: 196 lb  % Ideal Body Weight: 115%  Wt Readings from Last 10 Encounters:  11/21/12 225 lb 1.4 oz (102.1 kg)  11/21/12 225 lb 1.4 oz (102.1 kg)  11/02/12 227 lb 8.2 oz (103.2 kg)  11/02/12 227 lb 8.2 oz (103.2 kg)  10/24/12 226 lb (102.513 kg)  10/24/12 226 lb (102.513 kg)  09/18/12 226 lb (102.513 kg)  08/08/12 229 lb 1.6 oz (103.919 kg)  05/26/12 208 lb (94.348 kg)  04/10/12 229 lb 6.4 oz (104.055 kg)    Usual Body Weight: 229 lb  % Usual Body Weight: 98%  BMI:  Body mass index is 28.13 kg/(m^2).  Estimated Nutritional  Needs: Kcal: 2100-2300 Protein: 110-120 gm Fluid: 2.1-2.3 L  Skin: Stage I pressure ulcer to buttocks  Diet Order: NPO  EDUCATION NEEDS: -No education needs identified at this time   Intake/Output Summary (Last 24 hours) at 11/22/12 1429 Last data filed at 11/22/12 1046  Gross per 24 hour  Intake 1597.5 ml  Output    175 ml  Net 1422.5 ml    Labs:   Recent Labs Lab 11/21/12 1025 11/22/12 1132  NA 133* 133*  K 2.9* 3.5  CL 95* 97  CO2 26 25  BUN 22 39*  CREATININE 4.63* 6.15*  CALCIUM 7.6* 8.0*  GLUCOSE 182* 175*    CBG (last 3)   Recent Labs  11/21/12 2135 11/21/12 2257  GLUCAP 211* 87    Scheduled Meds: . [START ON 11/23/2012] darbepoetin (ARANESP) injection - DIALYSIS  150 mcg Intravenous Q Thu-HD  . [START ON 11/23/2012] doxercalciferol  3 mcg Intravenous Q T,Th,Sa-HD  . [START ON 11/23/2012] ferric gluconate (FERRLECIT/NULECIT) IV  125 mg Intravenous Once  . pantoprazole (PROTONIX) IV  40 mg Intravenous Q12H    Continuous Infusions:   Past Medical History  Diagnosis Date  . Rectal bleeding   . Dialysis care   . GI bleed   . Hypertension   . Esophageal varices   . Alcoholic liver disease   . Hepatic encephalopathy   . Ascites   . Diabetes mellitus   . Detached retina   .  Renal disorder   . Pneumonia     Past Surgical History  Procedure Laterality Date  . Esophagogastroduodenoscopy  12/31/2010    EGD APC THERAPY  . Esophagogastroduodenoscopy  05/31/2012E    GD APC ABLATION  . Small bowel givens  12/01/2010  . Colonoscopy  09/07/2010  . Esophagogastroduodenoscopy  09/05/2010    OUTLAW  . Lung surgery  6/11    [place]  . Esophagogastroduodenoscopy  03/26/2011    Procedure: ESOPHAGOGASTRODUODENOSCOPY (EGD);  Surgeon: Malissa Hippo, MD;  [facility];  Service: Endoscopy;  Laterality: N/A;  8:30 am  . Hot hemostasis  03/26/2011    Procedure: HOT HEMOSTASIS (ARGON PLASMA COAGULATION/BICAP);  Surgeon: Malissa Hippo,  MD;  Location: [facility];  Service: Endoscopy;  Laterality: N/A;  . Stent in liver    . Esophagogastroduodenoscopy N/A 10/24/2012    Procedure: ESOPHAGOGASTRODUODENOSCOPY (EGD);  Surgeon: Malissa Hippo, MD;  Location: [facility];  Service: Endoscopy;  Laterality: N/A;    Maureen Chatters, RD, LDN Pager #: [phone] After-Hours Pager #: [phone]

## 2012-11-22 NOTE — Progress Notes (Signed)
Utilization review completed.  

## 2012-11-22 NOTE — Op Note (Signed)
[facility] [address] [place], [address]   OPERATIVE PROCEDURE REPORT  PATIENT :Timothy, Chan  MR#: [id number] BIRTHDATE :08-Aug-1950 GENDER: Male ENDOSCOPIST: Dr.  Lorenza Burton, MD ASSISTANT:   Kandice Robinsons, technician Hilbert Odor, RN PROCEDURE DATE: 12-16-2012 PRE-PROCEDURE PREPERATION: Patient fasted for 4 hours prior to procedure. PRE-PROCEDURE PHYSICAL: Patient has stable vital signs.  Neck is supple.  There is no JVD, thyromegaly or LAD.  Chest clear to auscultation.  S1 and S2 regular.  Abdomen soft, non-distended, non-tender with NABS. PROCEDURE:     EGD, diagnostic ASA CLASS:     Class IV INDICATIONS:     Coffe ground emesis,  Iron deficiency anemia, and Hematochezia. MEDICATIONS:     Fentanyl 25 mcg IV and Versed 3 mg IV TOPICAL ANESTHETIC:   Viscous Xylocaine-15 cc PO.  DESCRIPTION OF PROCEDURE: After the risks benefits and alternatives of the procedure were thoroughly explained, informed consent was obtained.  The Pentax Gastroscope [id number]  was introduced through the mouth and advanced to the second portion of the duodenum , without limitations. The instrument was slowly withdrawn as the mucosa was fully examined.   The esophagus was widely patent and Grade I esophagitis was noted at the GEJ. There was evidence of portal hypertensive gastropathy with GAVE note in the antrum. There was no fresh or old heme noted and therefore no ablation was done. The proximal small bowel appeared normal. There were no ulcers, erosions, masses or polyps noted. Retroflexed views revealed no abnormalities. The scope was then withdrawn from the patient and the procedure terminated. The patient tolerated the procedure without immediate complications.  IMPRESSION:  1) Normal appearing esophagus. 2) Grade I esophagitis at the GEJ. 3) Portal hypertensive gastropathy. 4) Gastric antral vascular ectasia. 5) Normal proximal small  bowel.  RECOMMENDATIONS:     1.  Anti-reflux regimen to be followed. 2.  Avoid NSAIDS for now. 3.  Continue PPI's. 4) Monitor serial CBC's.  REPEAT EXAM:  No recall planned for now. [name] DISCHARGE INSTRUCTIONS: Standard discharge instructions given. _______________________________ eSigned:  Dr. Lorenza Burton, MD 2012-12-16 4:56 PM   CPT CODES:     08657, EGD  DIAGNOSIS CODES:     578.0. 569.3, 280.9   CC: Vassie Loll, MD  PATIENT NAME:  Timothy, Chan MR#: [id number]

## 2012-11-22 NOTE — Consult Note (Signed)
Unassigned patient Reason for Consult: CGE, Blood in stool, anemia. Referring Physician: [facility]-Dr. Vassie Loll.  Timothy Chan is an 62 y.o. male.  HPI: 62 year old white male, with multiple medical issues listed below, known cirrhotic with varices  S/p TIPSS, SBP and ESRD had a bout of CGE during dialysis and gives a history of rectal bleeding off and on. He also has a history of chronic constipation. He denies having any dysphagia or odynophagia. EGD is being requested to workup up his anemai and blood in stool and CGE.   Past Medical History  Diagnosis Date  . Rectal bleeding   . Dialysis care   . GI bleed   . Hypertension   . Esophageal varices   . Alcoholic liver disease   . Hepatic encephalopathy   . Ascites   . Diabetes mellitus   . Detached retina   . Renal disorder   . Pneumonia    Past Surgical History  Procedure Laterality Date  . Esophagogastroduodenoscopy  12/31/2010    EGD [facility] THERAPY  . Esophagogastroduodenoscopy  05/31/2012E    GD [facility] ABLATION  . Small bowel givens  12/01/2010  . Colonoscopy  09/07/2010  . Esophagogastroduodenoscopy  09/05/2010    OUTLAW  . Lung surgery  6/11    [place]  . Esophagogastroduodenoscopy  03/26/2011    Procedure: ESOPHAGOGASTRODUODENOSCOPY (EGD);  Surgeon: Malissa Hippo, MD;  [facility];  Service: Endoscopy;  Laterality: N/A;  8:30 am  . Hot hemostasis  03/26/2011    Procedure: HOT HEMOSTASIS (ARGON PLASMA COAGULATION/BICAP);  Surgeon: Malissa Hippo, MD;  [facility];  Service: Endoscopy;  Laterality: N/A;  . Stent in liver    . Esophagogastroduodenoscopy N/A 10/24/2012    Procedure: ESOPHAGOGASTRODUODENOSCOPY (EGD);  Surgeon: Malissa Hippo, MD;  [facility];  Service: Endoscopy;  Laterality: N/A;    No family history on file.  Social History:  reports that he quit smoking about 15 months ago. His smoking use included Cigarettes. He smoked 0.50 packs per day. He quit smokeless  tobacco use about 6 months ago. He reports that he does not drink alcohol or use illicit drugs.  Allergies:  Allergies  Allergen Reactions  . Tylenol (Acetaminophen) Other (See Comments)    cirrhosis  . Morphine And Related Itching   Medications: I have reviewed the patient's current medications.  Results for orders placed during the hospital encounter of 11/21/12 (from the past 48 hour(s))  CBC WITH DIFFERENTIAL     Status: Abnormal   Collection Time    11/21/12 10:25 AM      Result Value Range   WBC 6.4  4.0 - 10.5 K/uL   RBC 1.89 (*) 4.22 - 5.81 MIL/uL   Hemoglobin 6.0 (*) 13.0 - 17.0 g/dL   Comment: CRITICAL RESULT CALLED TO, READ BACK BY AND VERIFIED WITH:     FORD K ON 05/13 AT 1235 BY RESSEGGER R   HCT 18.0 (*) 39.0 - 52.0 %   MCV 95.2  78.0 - 100.0 fL   MCH 31.7  26.0 - 34.0 pg   MCHC 33.3  30.0 - 36.0 g/dL   RDW 16.1 (*) 09.6 - 04.5 %   Platelets 127 (*) 150 - 400 K/uL   Neutrophils Relative % 75  43 - 77 %   Neutro Abs 4.8  1.7 - 7.7 K/uL   Lymphocytes Relative 11 (*) 12 - 46 %   Lymphs Abs 0.7  0.7 - 4.0 K/uL  Monocytes Relative 12  3 - 12 %   Monocytes Absolute 0.8  0.1 - 1.0 K/uL   Eosinophils Relative 2  0 - 5 %   Eosinophils Absolute 0.1  0.0 - 0.7 K/uL   Basophils Relative 1  0 - 1 %   Basophils Absolute 0.0  0.0 - 0.1 K/uL  BASIC METABOLIC PANEL     Status: Abnormal   Collection Time    11/21/12 10:25 AM      Result Value Range   Sodium 133 (*) 135 - 145 mEq/L   Potassium 2.9 (*) 3.5 - 5.1 mEq/L   Chloride 95 (*) 96 - 112 mEq/L   CO2 26  19 - 32 mEq/L   Glucose, Bld 182 (*) 70 - 99 mg/dL   BUN 22  6 - 23 mg/dL   Creatinine, Ser 1.61 (*) 0.50 - 1.35 mg/dL   Calcium 7.6 (*) 8.4 - 10.5 mg/dL   GFR calc non Af Amer 12 (*) >90 mL/min   GFR calc Af Amer 14 (*) >90 mL/min   Comment:            The eGFR has been calculated     using the CKD EPI equation.     This calculation has not been     validated in all clinical     situations.     eGFR's  persistently     <90 mL/min signify     possible Chronic Kidney Disease.  PRO B NATRIURETIC PEPTIDE     Status: Abnormal   Collection Time    11/21/12 11:45 AM      Result Value Range   Pro B Natriuretic peptide (BNP) 1167.0 (*) 0 - 125 pg/mL  TROPONIN I     Status: None   Collection Time    11/21/12 11:45 AM      Result Value Range   Troponin I <0.30  <0.30 ng/mL   Comment:            Due to the release kinetics of cTnI,     a negative result within the first hours     of the onset of symptoms does not rule out     myocardial infarction with certainty.     If myocardial infarction is still suspected,     repeat the test at appropriate intervals.  AMMONIA     Status: None   Collection Time    11/21/12  2:45 PM      Result Value Range   Ammonia 18  11 - 60 umol/L  PREPARE RBC (CROSSMATCH)     Status: None   Collection Time    11/21/12  2:45 PM      Result Value Range   Order Confirmation ORDER PROCESSED BY BLOOD BANK    TYPE AND SCREEN     Status: None   Collection Time    11/21/12  2:45 PM      Result Value Range   ABO/RH(D) B NEG     Antibody Screen NEG     Sample Expiration 11/24/2012     Unit Number [id number]     Blood Component Type RED CELLS,LR     Unit division 00     Status of Unit ISSUED,FINAL     Transfusion Status OK TO TRANSFUSE     Crossmatch Result Compatible     Unit Number [id number]     Blood Component Type RED CELLS,LR     Unit division 00  Status of Unit ALLOCATED     Transfusion Status OK TO TRANSFUSE     Crossmatch Result Compatible    OCCULT BLOOD, POC DEVICE     Status: Abnormal   Collection Time    11/21/12  2:56 PM      Result Value Range   Fecal Occult Bld POSITIVE (*) NEGATIVE  MRSA PCR SCREENING     Status: None   Collection Time    11/21/12  6:23 PM      Result Value Range   MRSA by PCR NEGATIVE  NEGATIVE   Comment:            The GeneXpert MRSA Assay (FDA     approved for NASAL specimens     only), is one component  of a     comprehensive MRSA colonization     surveillance program. It is not     intended to diagnose MRSA     infection nor to guide or     monitor treatment for     MRSA infections.  GLUCOSE, CAPILLARY     Status: Abnormal   Collection Time    11/21/12  9:35 PM      Result Value Range   Glucose-Capillary 211 (*) 70 - 99 mg/dL   Comment 1 Notify RN     Comment 2 Documented in Chart    CBC     Status: Abnormal   Collection Time    11/21/12 10:06 PM      Result Value Range   WBC 6.5  4.0 - 10.5 K/uL   RBC 2.09 (*) 4.22 - 5.81 MIL/uL   Hemoglobin 6.6 (*) 13.0 - 17.0 g/dL   Comment: CRITICAL RESULT CALLED TO, READ BACK BY AND VERIFIED WITH:     CONNER,S RN 11/21/2012 2231 [address]   HCT 19.4 (*) 39.0 - 52.0 %   MCV 92.8  78.0 - 100.0 fL   MCH 31.6  26.0 - 34.0 pg   MCHC 34.0  30.0 - 36.0 g/dL   RDW 16.1 (*) 09.6 - 04.5 %   Platelets 123 (*) 150 - 400 K/uL  PREPARE RBC (CROSSMATCH)     Status: None   Collection Time    11/21/12 10:41 PM      Result Value Range   Order Confirmation ORDER PROCESSED BY BLOOD BANK    GLUCOSE, CAPILLARY     Status: None   Collection Time    11/21/12 10:57 PM      Result Value Range   Glucose-Capillary 87  70 - 99 mg/dL   Comment 1 Notify RN     Comment 2 Documented in Chart    TYPE AND SCREEN     Status: None   Collection Time    11/22/12  1:16 AM      Result Value Range   ABO/RH(D) B NEG     Antibody Screen NEG     Sample Expiration 11/25/2012     Unit Number [id number]     Blood Component Type RED CELLS,LR     Unit division 00     Status of Unit ISSUED     Transfusion Status OK TO TRANSFUSE     Crossmatch Result Compatible    ABO/RH     Status: None   Collection Time    11/22/12  1:16 AM      Result Value Range   ABO/RH(D) B NEG    PROTIME-INR     Status: Abnormal   Collection Time  11/22/12  1:24 AM      Result Value Range   Prothrombin Time 15.6 (*) 11.6 - 15.2 seconds   INR 1.27  0.00 - 1.49  COMPREHENSIVE METABOLIC  PANEL     Status: Abnormal   Collection Time    11/22/12 11:32 AM      Result Value Range   Sodium 133 (*) 135 - 145 mEq/L   Potassium 3.5  3.5 - 5.1 mEq/L   Chloride 97  96 - 112 mEq/L   CO2 25  19 - 32 mEq/L   Glucose, Bld 175 (*) 70 - 99 mg/dL   BUN 39 (*) 6 - 23 mg/dL   Creatinine, Ser 4.54 (*) 0.50 - 1.35 mg/dL   Calcium 8.0 (*) 8.4 - 10.5 mg/dL   Total Protein 5.6 (*) 6.0 - 8.3 g/dL   Albumin 1.2 (*) 3.5 - 5.2 g/dL   AST 22  0 - 37 U/L   ALT 14  0 - 53 U/L   Alkaline Phosphatase 103  39 - 117 U/L   Total Bilirubin 1.8 (*) 0.3 - 1.2 mg/dL   GFR calc non Af Amer 9 (*) >90 mL/min   GFR calc Af Amer 10 (*) >90 mL/min   Comment:            The eGFR has been calculated     using the CKD EPI equation.     This calculation has not been     validated in all clinical     situations.     eGFR's persistently     <90 mL/min signify     possible Chronic Kidney Disease.  CBC     Status: Abnormal   Collection Time    11/22/12 11:32 AM      Result Value Range   WBC 7.3  4.0 - 10.5 K/uL   RBC 2.58 (*) 4.22 - 5.81 MIL/uL   Hemoglobin 7.9 (*) 13.0 - 17.0 g/dL   HCT 09.8 (*) 11.9 - 14.7 %   MCV 89.9  78.0 - 100.0 fL   MCH 30.6  26.0 - 34.0 pg   MCHC 34.1  30.0 - 36.0 g/dL   RDW 82.9 (*) 56.2 - 13.0 %   Platelets 135 (*) 150 - 400 K/uL    Dg Chest Portable 1 View  11/21/2012   *RADIOLOGY REPORT*  Clinical Data: Weakness and shortness of breath.  PORTABLE CHEST - 1 VIEW  Comparison: Single view of the chest 10/29/2012 and 10/20/2012.  Findings: Right lower lobe airspace disease seen on the most recent study has improved.  There is patchy airspace disease in the left lung base.  No pneumothorax or pleural fluid is identified.  Heart size is upper normal.  IMPRESSION: Improved right lower lobe airspace disease with patchy airspace opacity in the left lung base which could be due to atelectasis or pneumonia.   Original Report Authenticated By: Holley Dexter, M.D.    Review of Systems   Constitutional: Negative for fever, chills, weight loss and malaise/fatigue.  Respiratory: Negative.   Cardiovascular: Negative.   Gastrointestinal: Positive for nausea, vomiting, constipation and blood in stool.  Genitourinary: Negative.   Skin: Negative.   Neurological: Positive for weakness.  Endo/Heme/Allergies: Negative.   Psychiatric/Behavioral: Negative.    Blood pressure 112/62, pulse 104, temperature 98.1 F (36.7 C), temperature source Oral, resp. rate 12, height 6\' 3"  (1.905 m), weight 102.1 kg (225 lb 1.4 oz), SpO2 100.00%. Physical Exam  Constitutional: He is oriented to person, place,  and time. He appears well-developed and well-nourished.  HENT:  Head: Normocephalic and atraumatic.  Eyes: Conjunctivae and EOM are normal. Pupils are equal, round, and reactive to light.  Neck: Normal range of motion. Neck supple.  Cardiovascular: Normal rate and regular rhythm.   Respiratory: Effort normal and breath sounds normal.  GI: Soft. He exhibits distension. He exhibits no mass. There is tenderness. There is no rebound and no guarding.  Musculoskeletal: Normal range of motion.  Neurological: He is alert and oriented to person, place, and time.  Skin: Skin is warm and dry.  Psychiatric: He has a normal mood and affect. His behavior is normal. Judgment and thought content normal.   Assessment/Plan: 1) Anemia with CGE and rectal bleeding: proceed with an EGD today. 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   3) History of SBP.[name] 4) ESRD : On dialysis.  Braley Luckenbaugh 11/22/2012, 4:20 PM

## 2012-11-22 NOTE — Consult Note (Signed)
Reason for Consult: Dialysis and dialysis related needs Referring Physician: Dr. Viona Gilmore JARION HAWTHORNE is an 62 y.o. male with past medical history significant for hypertension and end-stage renal disease (most recently dialyzing at the [facility] unit on TTS schedule ). He also has end-stage liver disease and is status post a hospitalization last month at [facility] where he had GI bleeding and was diagnosed with GAVE as well as having encephalopathy and subacute bacterial peritonitis. After that hospitalization he was discharged to the [facility] nursing home and per his report has not been having a good experience there. He reports he had been GI bleeding in telling people and" nobody did anything about it". He was admitted yesterday after an episode of coffee-ground emesis while at dialysis. His hemoglobin was noted to be 6 is now 7.9 status post transfusion. He's had no more vomiting but still has blood in his stools per his report. GI has been consult and and and patient believes they're going to do another EGD. He has no acute indications for further dialysis today.   Dialyzes at [facility] on TTS  Primary Nephrologist Dr. Fausto Skillern. EDW 102 kg. HD Bath 3K 2.5 calcium, Heparin yes. Access left AV fistula.  Past Medical History  Diagnosis Date  . Rectal bleeding   . Dialysis care   . GI bleed   . Hypertension   . Esophageal varices   . Alcoholic liver disease   . Hepatic encephalopathy   . Ascites   . Diabetes mellitus   . Detached retina   . Renal disorder   . Pneumonia     Past Surgical History  Procedure Laterality Date  . Esophagogastroduodenoscopy  12/31/2010    EGD [facility]APY  . Esophagogastroduodenoscopy  05/31/2012E    GD [facility] ABLATION  . Small bowel givens  12/01/2010  . Colonoscopy  09/07/2010  . Esophagogastroduodenoscopy  09/05/2010    OUTLAW  . Lung surgery  6/11    [place]  . Esophagogastroduodenoscopy  03/26/2011    Procedure:  ESOPHAGOGASTRODUODENOSCOPY (EGD);  Surgeon: Malissa Hippo, MD;  [facility];  Service: Endoscopy;  Laterality: N/A;  8:30 am  . Hot hemostasis  03/26/2011    Procedure: HOT HEMOSTASIS (ARGON PLASMA COAGULATION/BICAP);  Surgeon: Malissa Hippo, MD;  [facility];  Service: Endoscopy;  Laterality: N/A;  . Stent in liver    . Esophagogastroduodenoscopy N/A 10/24/2012    Procedure: ESOPHAGOGASTRODUODENOSCOPY (EGD);  Surgeon: Malissa Hippo, MD;  [facility];  Service: Endoscopy;  Laterality: N/A;    No family history on file.  Social History:  reports that he quit smoking about 15 months ago. His smoking use included Cigarettes. He smoked 0.50 packs per day. He quit smokeless tobacco use about 6 months ago. He reports that he does not drink alcohol or use illicit drugs.  Allergies:  Allergies  Allergen Reactions  . Tylenol (Acetaminophen) Other (See Comments)    cirrhosis  . Morphine And Related Itching    Medications: I have reviewed the patient's current medications.  Hectorol 3 mcg,  Epogen 9900 units  Venofer 100 weekly  Results for orders placed during the hospital encounter of 11/21/12 (from the past 48 hour(s))  CBC WITH DIFFERENTIAL     Status: Abnormal   Collection Time    11/21/12 10:25 AM      Result Value Range   WBC 6.4  4.0 - 10.5 K/uL   RBC 1.89 (*) 4.22 - 5.81 MIL/uL  Hemoglobin 6.0 (*) 13.0 - 17.0 g/dL   Comment: CRITICAL RESULT CALLED TO, READ BACK BY AND VERIFIED WITH:     FORD K ON 05/13 AT 1235 BY RESSEGGER R   HCT 18.0 (*) 39.0 - 52.0 %   MCV 95.2  78.0 - 100.0 fL   MCH 31.7  26.0 - 34.0 pg   MCHC 33.3  30.0 - 36.0 g/dL   RDW 45.4 (*) 09.8 - 11.9 %   Platelets 127 (*) 150 - 400 K/uL   Neutrophils Relative % 75  43 - 77 %   Neutro Abs 4.8  1.7 - 7.7 K/uL   Lymphocytes Relative 11 (*) 12 - 46 %   Lymphs Abs 0.7  0.7 - 4.0 K/uL   Monocytes Relative 12  3 - 12 %   Monocytes Absolute 0.8  0.1 - 1.0 K/uL   Eosinophils  Relative 2  0 - 5 %   Eosinophils Absolute 0.1  0.0 - 0.7 K/uL   Basophils Relative 1  0 - 1 %   Basophils Absolute 0.0  0.0 - 0.1 K/uL  BASIC METABOLIC PANEL     Status: Abnormal   Collection Time    11/21/12 10:25 AM      Result Value Range   Sodium 133 (*) 135 - 145 mEq/L   Potassium 2.9 (*) 3.5 - 5.1 mEq/L   Chloride 95 (*) 96 - 112 mEq/L   CO2 26  19 - 32 mEq/L   Glucose, Bld 182 (*) 70 - 99 mg/dL   BUN 22  6 - 23 mg/dL   Creatinine, Ser 1.47 (*) 0.50 - 1.35 mg/dL   Calcium 7.6 (*) 8.4 - 10.5 mg/dL   GFR calc non Af Amer 12 (*) >90 mL/min   GFR calc Af Amer 14 (*) >90 mL/min   Comment:            The eGFR has been calculated     using the CKD EPI equation.     This calculation has not been     validated in all clinical     situations.     eGFR's persistently     <90 mL/min signify     possible Chronic Kidney Disease.  PRO B NATRIURETIC PEPTIDE     Status: Abnormal   Collection Time    11/21/12 11:45 AM      Result Value Range   Pro B Natriuretic peptide (BNP) 1167.0 (*) 0 - 125 pg/mL  TROPONIN I     Status: None   Collection Time    11/21/12 11:45 AM      Result Value Range   Troponin I <0.30  <0.30 ng/mL   Comment:            Due to the release kinetics of cTnI,     a negative result within the first hours     of the onset of symptoms does not rule out     myocardial infarction with certainty.     If myocardial infarction is still suspected,     repeat the test at appropriate intervals.  AMMONIA     Status: None   Collection Time    11/21/12  2:45 PM      Result Value Range   Ammonia 18  11 - 60 umol/L  PREPARE RBC (CROSSMATCH)     Status: None   Collection Time    11/21/12  2:45 PM      Result Value Range  Order Confirmation ORDER PROCESSED BY BLOOD BANK    TYPE AND SCREEN     Status: None   Collection Time    11/21/12  2:45 PM      Result Value Range   ABO/RH(D) B NEG     Antibody Screen NEG     Sample Expiration 11/24/2012     Unit Number  [id number]     Blood Component Type RED CELLS,LR     Unit division 00     Status of Unit ISSUED,FINAL     Transfusion Status OK TO TRANSFUSE     Crossmatch Result Compatible     Unit Number [id number]     Blood Component Type RED CELLS,LR     Unit division 00     Status of Unit ALLOCATED     Transfusion Status OK TO TRANSFUSE     Crossmatch Result Compatible    OCCULT BLOOD, POC DEVICE     Status: Abnormal   Collection Time    11/21/12  2:56 PM      Result Value Range   Fecal Occult Bld POSITIVE (*) NEGATIVE  MRSA PCR SCREENING     Status: None   Collection Time    11/21/12  6:23 PM      Result Value Range   MRSA by PCR NEGATIVE  NEGATIVE   Comment:            The GeneXpert MRSA Assay (FDA     approved for NASAL specimens     only), is one component of a     comprehensive MRSA colonization     surveillance program. It is not     intended to diagnose MRSA     infection nor to guide or     monitor treatment for     MRSA infections.  GLUCOSE, CAPILLARY     Status: Abnormal   Collection Time    11/21/12  9:35 PM      Result Value Range   Glucose-Capillary 211 (*) 70 - 99 mg/dL   Comment 1 Notify RN     Comment 2 Documented in Chart    CBC     Status: Abnormal   Collection Time    11/21/12 10:06 PM      Result Value Range   WBC 6.5  4.0 - 10.5 K/uL   RBC 2.09 (*) 4.22 - 5.81 MIL/uL   Hemoglobin 6.6 (*) 13.0 - 17.0 g/dL   Comment: CRITICAL RESULT CALLED TO, READ BACK BY AND VERIFIED WITH:     CONNER,S RN 11/21/2012 2231 [address]   HCT 19.4 (*) 39.0 - 52.0 %   MCV 92.8  78.0 - 100.0 fL   MCH 31.6  26.0 - 34.0 pg   MCHC 34.0  30.0 - 36.0 g/dL   RDW 96.2 (*) 95.2 - 84.1 %   Platelets 123 (*) 150 - 400 K/uL  PREPARE RBC (CROSSMATCH)     Status: None   Collection Time    11/21/12 10:41 PM      Result Value Range   Order Confirmation ORDER PROCESSED BY BLOOD BANK    GLUCOSE, CAPILLARY     Status: None   Collection Time    11/21/12 10:57 PM      Result Value  Range   Glucose-Capillary 87  70 - 99 mg/dL   Comment 1 Notify RN     Comment 2 Documented in Chart    TYPE AND SCREEN     Status: None  Collection Time    11/22/12  1:16 AM      Result Value Range   ABO/RH(D) B NEG     Antibody Screen NEG     Sample Expiration 11/25/2012     Unit Number [id number]     Blood Component Type RED CELLS,LR     Unit division 00     Status of Unit ISSUED     Transfusion Status OK TO TRANSFUSE     Crossmatch Result Compatible    ABO/RH     Status: None   Collection Time    11/22/12  1:16 AM      Result Value Range   ABO/RH(D) B NEG    PROTIME-INR     Status: Abnormal   Collection Time    11/22/12  1:24 AM      Result Value Range   Prothrombin Time 15.6 (*) 11.6 - 15.2 seconds   INR 1.27  0.00 - 1.49  COMPREHENSIVE METABOLIC PANEL     Status: Abnormal   Collection Time    11/22/12 11:32 AM      Result Value Range   Sodium 133 (*) 135 - 145 mEq/L   Potassium 3.5  3.5 - 5.1 mEq/L   Chloride 97  96 - 112 mEq/L   CO2 25  19 - 32 mEq/L   Glucose, Bld 175 (*) 70 - 99 mg/dL   BUN 39 (*) 6 - 23 mg/dL   Creatinine, Ser 1.19 (*) 0.50 - 1.35 mg/dL   Calcium 8.0 (*) 8.4 - 10.5 mg/dL   Total Protein 5.6 (*) 6.0 - 8.3 g/dL   Albumin 1.2 (*) 3.5 - 5.2 g/dL   AST 22  0 - 37 U/L   ALT 14  0 - 53 U/L   Alkaline Phosphatase 103  39 - 117 U/L   Total Bilirubin 1.8 (*) 0.3 - 1.2 mg/dL   GFR calc non Af Amer 9 (*) >90 mL/min   GFR calc Af Amer 10 (*) >90 mL/min   Comment:            The eGFR has been calculated     using the CKD EPI equation.     This calculation has not been     validated in all clinical     situations.     eGFR's persistently     <90 mL/min signify     possible Chronic Kidney Disease.  CBC     Status: Abnormal   Collection Time    11/22/12 11:32 AM      Result Value Range   WBC 7.3  4.0 - 10.5 K/uL   RBC 2.58 (*) 4.22 - 5.81 MIL/uL   Hemoglobin 7.9 (*) 13.0 - 17.0 g/dL   HCT 14.7 (*) 82.9 - 56.2 %   MCV 89.9  78.0 - 100.0 fL    MCH 30.6  26.0 - 34.0 pg   MCHC 34.1  30.0 - 36.0 g/dL   RDW 13.0 (*) 86.5 - 78.4 %   Platelets 135 (*) 150 - 400 K/uL    Dg Chest Portable 1 View  11/21/2012   *RADIOLOGY REPORT*  Clinical Data: Weakness and shortness of breath.  PORTABLE CHEST - 1 VIEW  Comparison: Single view of the chest 10/29/2012 and 10/20/2012.  Findings: Right lower lobe airspace disease seen on the most recent study has improved.  There is patchy airspace disease in the left lung base.  No pneumothorax or pleural fluid is identified.  Heart size is upper  normal.  IMPRESSION: Improved right lower lobe airspace disease with patchy airspace opacity in the left lung base which could be due to atelectasis or pneumonia.   Original Report Authenticated By: Holley Dexter, M.D.    ROS: Patient does admit to abdominal distention. He is having dark tarry stools. He denies any abdominal pain. He denies chest pain, shortness of breath, diaphoresis, fevers, chills, night sweats. He denies lower extremity edema. He is hungry and wants to eat. He is generally unhappy with about everything related to this hospitalization and the [facility] nursing home. The remainder of the review systems is negative. Blood pressure 112/57, pulse 104, temperature 98.1 F (36.7 C), temperature source Oral, resp. rate 11, height 6\' 3"  (1.905 m), weight 102.1 kg (225 lb 1.4 oz), SpO2 97.00%. General appearance: alert and unhappy Eyes: conjunctivae/corneas clear. PERRL, EOM's intact. Fundi benign. Neck: no adenopathy, no carotid bruit, no JVD, supple, symmetrical, trachea midline and thyroid not enlarged, symmetric, no tenderness/mass/nodules Resp: diminished breath sounds bibasilar Cardio: regular rate and rhythm, S1, S2 normal, no murmur, click, rub or gallop GI: abnormal findings:  ascites, distended, mass, located in the RLQ and obese and non tender Extremities: extremities normal, atraumatic, no cyanosis or edema and venous stasis dermatitis  noted Neurologic: Grossly normal left lower arm avf with good thrill  Assessment/Plan: 62 year old white male with end-stage renal disease and also cirrhosis and GAVE. He now presents with upper GI bleeding. 1 upper GI bleeding: Per GI. Status post 2 units transfusion with appropriate bump in hemoglobin. We'll use no heparin with dialysis. He possibly will have another EGD although he's had one recently. 2 ESRD: There are no indications for dialysis today. His regular days are TTS via an AV fistula. Will plan on doing a regular dialysis treatment without heparin tomorrow. 3 Hypertension: Blood pressure controlled on no medications. He is right at his dry weight. It's difficult to tell his volume status as he does have abdominal distention likely ascites but no peripheral edema. We'll challenge a little bit with dialysis tomorrow. 4. Anemia of ESRD: He now has acute blood loss status post transfusion. Will maximize ESA.  I also will order that if hgb if less than 7.5 tomorrow will transfuse 5. Metabolic Bone Disease: Is normally on Hectorol with dialysis and now will be ordered. Is also normally on Fosrenol. We will resume that if he starts eating.   Jermey Closs A 11/22/2012, 1:24 PM

## 2012-11-22 NOTE — Progress Notes (Signed)
Inpatient Diabetes Program Recommendations  AACE/ADA: New Consensus Statement on Inpatient Glycemic Control (2013)  Target Ranges:  Prepandial:   less than 140 mg/dL      Peak postprandial:   less than 180 mg/dL (1-2 hours)      Critically ill patients:  140 - 180 mg/dL    History of Type 2 diabetes.  Takes Glipizide at home.  Inpatient Diabetes Program Recommendations Correction (SSI): Please check CBGs Q4 hours and cover with Novolog Sensitive correction scale (SS) if CBGs elevated.  Will follow. Ambrose Finland RN, MSN, CDE Diabetes Coordinator Inpatient Diabetes Program [phone]

## 2012-11-22 NOTE — Progress Notes (Addendum)
[facility] Progress Note [place] TEAM 1 - Stepdown/ICU TEAM   GEN CLAGG WUJ:[id number] DOB: 1951/06/09 DOA: 11/21/2012 PCP: Ignatius Specking., MD  Brief narrative:   Timothy Chan is a 62 y.o. male with PMH of ESRD previously on PD now on HD TTS, during his dialysis treatment today felt very weak, sick, nauseous and was sent to the ER 1 hour before completing his HD treatment. Upon evaluation in ER he was briefly hypotensive, improved with NS bolus.  He was found to have Hb of 6.0 which was a drop from 8.9 taken 2 weeks prior. His hemoccult is positive. Pt reports h/o black stools off and on for past 2 weeks. denies NSAID, ETOH use. Last EGD by Dr.Rahman 4/11 noted : Multiple gastric antral vascular ectasia; 2 with active bleeding/oozing. Several of these lesions were ablated with APC then.   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 (per patient) - Negative for Hep B - Positive ethanol in system, advised patient not to consume alcohol - manage outpatient  Diabetes Mellitus type 2, uncontrolled, with complications - Currently controlled, manage outpatient - NPO, no oral meds, manage on SSI  CKD (chronic kidney disease) stage V requiring chronic dialysis - Etiology: hepato-renal syndrome  - monitor fluid status, urine output, and serum electrolytes closely - Nephrology  following- HD TTSa  Thrombocytopenia, unspecified - Etiology: liver cirrhosis - PT elevated slightly, INR normal - advise to no smoke or drink, folate and B12 recommended in diet - If count is lower than 80-100, consider platelet transfusion   DVT prophylaxis: SCDs Code Status:  DNR Family Communication: no family present-patient only Disposition Plan: SDU Isolation: none  Consultants: GI, nephrology    Procedures: none    Antibiotics: none   HPI/Subjective: Patient alert and oriented x 3 without signs of confusion, delirium, or memory loss. States he has no nausea, but has been constipated and not had a bowel movement. He states he has some mild chest pain, but denies radiating pain, shortness of breath, wheezing, or coughing up blood. While in the room, patient had a 15 second SVT irregular rhythm without any symptoms. He denies vision changes, hearing loss, weakness, fever, chills, or difficulty sleeping. Admits to weight loss prior to admission in last 2 weeks.   Objective: Blood pressure 114/55, pulse 98, temperature 98.7 F (37.1 C), temperature source Oral, resp. rate 11, height 6\' 3"  (1.905 m), weight 102.1 kg (225 lb 1.4 oz), SpO2 98.00%.  Intake/Output Summary (Last 24 hours) at 11/22/12 0851 Last data filed at 11/22/12 0600  Gross per 24 hour  Intake 1587.5 ml  Output      0 ml  Net 1587.5 ml     Exam: General: No acute respiratory distress, moderately jaundiced, bruising marks on abdomen and extremities, gynecomastia, parotid enlargement, and ascites noted. Lungs: Clear to auscultation bilaterally without wheezes or crackles, RA Cardiovascular: Regular rate and rhythm without murmur gallop or  rub normal S1 and S2, no peripheral edema or JVD Abdomen: Nontender, ascites, moderately distended, bowel sounds positive, no rebound, appreciable hematoma left lower quadrant proximal to umbilicus Musculoskeletal: No significant cyanosis, clubbing of bilateral lower  extremities Neurological: Alert and oriented x 3, moves all extremities x 4 without focal neurological deficits, CN 2-12 intact  Data Reviewed: Basic Metabolic Panel:  Recent Labs Lab 11/21/12 1025  NA 133*  K 2.9*  CL 95*  CO2 26  GLUCOSE 182*  BUN 22  CREATININE 4.63*  CALCIUM 7.6*   Liver Function Tests: No results found for this basename: AST, ALT, ALKPHOS, BILITOT, PROT, ALBUMIN,  in the last 168 hours No results found for this basename: LIPASE, AMYLASE,  in the last 168 hours  Recent Labs Lab 11/21/12 1445  AMMONIA 18   CBC:  Recent Labs Lab 11/21/12 1025 11/21/12 2206  WBC 6.4 6.5  NEUTROABS 4.8  --   HGB 6.0* 6.6*  HCT 18.0* 19.4*  MCV 95.2 92.8  PLT 127* 123*   Cardiac Enzymes:  Recent Labs Lab 11/21/12 1145  TROPONINI <0.30   BNP (last 3 results)  Recent Labs  11/21/12 1145  PROBNP 1167.0*   CBG:  Recent Labs Lab 11/21/12 2135 11/21/12 2257  GLUCAP 211* 87    Recent Results (from the past 240 hour(s))  MRSA PCR SCREENING     Status: None   Collection Time    11/21/12  6:23 PM      Result Value Range Status   MRSA by PCR NEGATIVE  NEGATIVE Final   Comment:            The GeneXpert MRSA Assay (FDA     approved for NASAL specimens     only), is one component of a     comprehensive MRSA colonization     surveillance program. It is not     intended to diagnose MRSA     infection nor to guide or     monitor treatment for     MRSA infections.     Studies:  Recent x-ray studies have been reviewed in detail by the Attending Physician  Scheduled Meds:  Reviewed in detail by the Attending Physician   Junious Silk, ANP [facility] Office  [phone] Pager [phone]  On-Call/Text Page:      [email or web address]      password [email or web address]  If 7PM-7AM, please contact night-coverage [email or web address] Password [facility] 11/22/2012, 8:51 AM   LOS: 1 day   JAMIE SEALS, PA-S  I have examined the patient, reviewed the chart and modified  the above note which I agree with.   Theia Dezeeuw,MD [phone] 11/22/2012, 2:56 PM

## 2012-11-23 ENCOUNTER — Encounter (HOSPITAL_COMMUNITY): Payer: Self-pay | Admitting: Gastroenterology

## 2012-11-23 LAB — BASIC METABOLIC PANEL
BUN: 50 mg/dL — ABNORMAL HIGH (ref 6–23)
Calcium: 7.6 mg/dL — ABNORMAL LOW (ref 8.4–10.5)
Chloride: 98 mEq/L (ref 96–112)
Creatinine, Ser: 7.08 mg/dL — ABNORMAL HIGH (ref 0.50–1.35)
GFR calc Af Amer: 9 mL/min — ABNORMAL LOW (ref >90)

## 2012-11-23 LAB — CBC
HCT: 21.7 % — ABNORMAL LOW (ref 39.0–52.0)
MCHC: 34.6 g/dL (ref 30.0–36.0)
MCV: 88.6 fL (ref 78.0–100.0)
Platelets: 137 10*3/uL — ABNORMAL LOW (ref 150–400)
Platelets: 121 10*3/uL — ABNORMAL LOW (ref 150–400)
RBC: 2.3 MIL/uL — ABNORMAL LOW (ref 4.22–5.81)
RDW: 18.4 % — ABNORMAL HIGH (ref 11.5–15.5)
RDW: 17.1 % — ABNORMAL HIGH (ref 11.5–15.5)
WBC: 7.2 10*3/uL (ref 4.0–10.5)
WBC: 7.2 10*3/uL (ref 4.0–10.5)

## 2012-11-23 LAB — ALBUMIN: Albumin: 1.2 g/dL — ABNORMAL LOW (ref 3.5–5.2)

## 2012-11-23 LAB — GLUCOSE, CAPILLARY
Glucose-Capillary: 217 mg/dL — ABNORMAL HIGH (ref 70–99)
Glucose-Capillary: 176 mg/dL — ABNORMAL HIGH (ref 70–99)

## 2012-11-23 LAB — PHOSPHORUS: Phosphorus: 5.2 mg/dL — ABNORMAL HIGH (ref 2.3–4.6)

## 2012-11-23 LAB — PREPARE RBC (CROSSMATCH)

## 2012-11-23 MED ORDER — INSULIN ASPART 100 UNIT/ML ~~LOC~~ SOLN
0.0000 [IU] | SUBCUTANEOUS | Status: DC
  Administered 2012-11-23: 3 [IU] via SUBCUTANEOUS
  Administered 2012-11-23: 5 [IU] via SUBCUTANEOUS
  Administered 2012-11-23: 3 [IU] via SUBCUTANEOUS
  Administered 2012-11-24: 15:00:00 via SUBCUTANEOUS
  Administered 2012-11-24: 3 [IU] via SUBCUTANEOUS
  Administered 2012-11-24: 09:00:00 via SUBCUTANEOUS

## 2012-11-23 MED ORDER — GLIPIZIDE 5 MG PO TABS
5.0000 mg | ORAL_TABLET | Freq: Two times a day (BID) | ORAL | Status: DC
  Administered 2012-11-23 – 2012-11-25 (×4): 5 mg via ORAL
  Filled 2012-11-23 (×7): qty 1

## 2012-11-23 MED ORDER — SODIUM CHLORIDE 0.9 % IV SOLN: 100.0000 mL | INTRAVENOUS | Status: DC | PRN

## 2012-11-23 MED ORDER — LIDOCAINE-PRILOCAINE 2.5-2.5 % EX CREA: 1.0000 "application " | TOPICAL_CREAM | CUTANEOUS | Status: DC | PRN

## 2012-11-23 MED ORDER — HEPARIN SODIUM (PORCINE) 1000 UNIT/ML DIALYSIS
1000.0000 [IU] | INTRAMUSCULAR | Status: DC | PRN
  Filled 2012-11-23: qty 1

## 2012-11-23 MED ORDER — DOXERCALCIFEROL 4 MCG/2ML IV SOLN
INTRAVENOUS | Status: AC
  Administered 2012-11-23: 4 ug via INTRAVENOUS
  Filled 2012-11-23: qty 2

## 2012-11-23 MED ORDER — LIDOCAINE HCL (PF) 1 % IJ SOLN: 5.0000 mL | INTRAMUSCULAR | Status: DC | PRN

## 2012-11-23 MED ORDER — OXYCODONE HCL 5 MG PO TABS
5.0000 mg | ORAL_TABLET | Freq: Four times a day (QID) | ORAL | Status: DC | PRN
  Administered 2012-11-23 – 2012-11-24 (×3): 5 mg via ORAL
  Filled 2012-11-23 (×3): qty 1

## 2012-11-23 MED ORDER — ALTEPLASE 2 MG IJ SOLR
2.0000 mg | Freq: Once | INTRAMUSCULAR | Status: AC | PRN
  Filled 2012-11-23: qty 2

## 2012-11-23 MED ORDER — PENTAFLUOROPROP-TETRAFLUOROETH EX AERO: 1.0000 "application " | INHALATION_SPRAY | CUTANEOUS | Status: DC | PRN

## 2012-11-23 MED ORDER — NEPRO/CARBSTEADY PO LIQD: 237.0000 mL | ORAL | Status: DC | PRN

## 2012-11-23 MED ORDER — DARBEPOETIN ALFA-POLYSORBATE 150 MCG/0.3ML IJ SOLN
INTRAMUSCULAR | Status: AC
  Administered 2012-11-23: 150 ug via INTRAVENOUS
  Filled 2012-11-23: qty 0.3

## 2012-11-23 MED ORDER — MORPHINE SULFATE 2 MG/ML IJ SOLN
2.0000 mg | Freq: Once | INTRAMUSCULAR | Status: AC
  Administered 2012-11-23: 2 mg via INTRAVENOUS
  Filled 2012-11-23: qty 1

## 2012-11-23 NOTE — Progress Notes (Signed)
Received to  6741  Via  Bed  ,alert/oriented x4, skin in  Tact  Bruising  To  Abdomen ,s/p   Pd cath  Removed.  Safety  inst  Given, bed alarm  Set.

## 2012-11-23 NOTE — Progress Notes (Signed)
MEDICARE-CERTIFIED HOME HEALTH AGENCIES [place]   Agencies that are Medicare-Certified and affiliated with [facility]  Home Health Agency  Telephone Number Address  [facility]  [facility] has ownership interest in this company; however, you are under no obligation to use this agency. [phone]  [address] [place], [place] [address]    Agencies that are Medicare-Certified and are not affiliated with [facility]   Home Health Agency Telephone Number Address  [facility] [phone] Fax [phone] [address] [place], [place]  [address]  [facility] [phone] [address] [place], [place] [address]  [facility]  [phone] Fax [phone] [address]  [place], [place]  [address]  [facility] [phone] or [phone] [address] [place], [place] [address]  [facility] [phone] or [phone] [address] [place], [place]  [address]      Agencies that are not Medicare-Certified and are not affiliated with [facility]    Home Health Agency Telephone Number Address  [facility] [phone] Fax [phone] [address] [place], [place]  [address]  [facility] [phone] or [phone] Fax [phone] [address] [place], [place]  [address]  [facility]  [phone] [address] [place], [place]  [facility] [phone] Fax [phone] [address] [place], [place]  [address]  [facility] [phone] Fax [phone] [address] [place], [place]  [address]  [facility] [phone] [address] [place], [place]  [address]  [facility] [phone] Fax [phone] [address] [place], [place] [address]  [facility] [phone] [address] [place], [place]  [address]  [facility] [phone] Fax [phone] [address] [place], [place]  [address]    

## 2012-11-23 NOTE — Care Management Note (Signed)
    Page 1 of 1   11/23/2012     2:47:51 PM   CARE MANAGEMENT NOTE 11/23/2012  Patient:  Timothy Chan, Timothy Chan   Account Number:  [id number]  Date Initiated:  11/22/2012  Documentation initiated by:  Donn Pierini  Subjective/Objective Assessment:   Pt admitted with LGIB/anemia     Action/Plan:   PTA pt was at [facility]- CSW consulted for placement needs   Anticipated DC Date:  11/24/2012   Anticipated DC Plan:  GROUP HOME  In-house referral  Clinical Social Worker      DC Planning Services  CM consult      [facility] Choice  HOME HEALTH   Choice offered to / List presented to:  C-1 Patient           Status of service:  In process, will continue to follow Medicare Important Message given?   (If response is "NO", the following Medicare IM given date fields will be blank) Date Medicare IM given:   Date Additional Medicare IM given:    Discharge Disposition:    Per UR Regulation:  Reviewed for med. necessity/level of care/duration of stay  If discussed at Long Length of Stay Meetings, dates discussed:    Comments:  11/23/12- 1430- Donn Pierini RN, BSN [phone] Pt admitted from SNF- received word from CSW that pt did not plan to return to SNF at discharge- in to speak with pt at bedside- per conversation pt confirmed that he was at [facility] for ST rehab and that his plans at discharge was to return home. Pt states that he has family and friends that will be able to assist at discharge and someone will be able to stay with him 24/7 to assist him. He states that he already has needed DME at home that includes cane, walker and w/c. Pt would benefit from [facility] services and is agreeable to them- he was going to have HH upon leaving the facility- list of [facility] agencies for [facility] given to pt for review- NCM to follow up with pt for choice prior to d/c. MD please order [facility]- RN/PT/OT/aide/CSW for discharge if agree-

## 2012-11-23 NOTE — Progress Notes (Signed)
Clinical Social Work Department BRIEF PSYCHOSOCIAL ASSESSMENT 11/23/2012  Patient:  Timothy Chan, Timothy Chan     Account Number:  [id number]     Admit date:  11/21/2012  Clinical Social Worker:  Margaree Mackintosh  Date/Time:  11/23/2012 12:00 M  Referred by:  Physician  Date Referred:  11/23/2012 Referred for  SNF Placement   Other Referral:   Pt admitted from [facility].   Interview type:  Patient Other interview type:    PSYCHOSOCIAL DATA Living Status:  FACILITY Admitted from facility:  [facility] Level of care:  Skilled Nursing Facility Primary support name:  Olin Gurski: [phone] Primary support relationship to patient:  CHILD, ADULT Degree of support available:   Adequate.    CURRENT CONCERNS Current Concerns  Post-Acute Placement   Other Concerns:    SOCIAL WORK ASSESSMENT / PLAN Clinical Social Worker received referral indicating pt is from [facility].  CSW reviewed chart and met with pt at bedside.  CSW introduced self, explained role, and provided support.  CSW provided active listening; pt shared that he does not wish to return to [facility].  Pt states he has communicated with his mother, sister, and sister in law and all are willing to assist with his care at dc; with sister in law living with him.  Pt states he would not receive breakfast prior to Dialysis and his lunch would be "cold pinto beans and tacos".  Pt states, "I can do better at home" and "I called to complain to the state twice and nothing happened".   Pt reports his mother and sister have retrieved his belongings from SNF.  CSW offered contact information to [facility] Ombudsman should pt wish to make a complaint.  CSW updated RNCM.  CSW to sign off, please re consult if needed.   Assessment/plan status:  Information/Referral to [facility] Other assessment/ plan:   Information/referral to community resources:   SNF  [facility]    PATIENT'S/FAMILY'S RESPONSE TO PLAN OF CARE: Pt was engaged in  conversation and thanked CSW for interventions.

## 2012-11-23 NOTE — Progress Notes (Signed)
Subjective:  Seen on HD- had EGD- nothing that needed ablation but hgb cont to drift down- got one unit this AM and going to give another in HD today.  Having some low BP with HD.  In a better mood today Objective Vital signs in last 24 hours: Filed Vitals:   11/23/12 0830 11/23/12 0900 11/23/12 0919 11/23/12 0930  BP: 96/55 77/42 77/48  84/39  Pulse: 100 101  103  Temp:      TempSrc:      Resp: 11 11  11   Height:      Weight:      SpO2:       Weight change:   Intake/Output Summary (Last 24 hours) at 11/23/12 0936 Last data filed at 11/23/12 0615  Gross per 24 hour  Intake  747.5 ml  Output    175 ml  Net  572.5 ml   Labs: Basic Metabolic Panel:  Recent Labs Lab 11/21/12 1025 11/22/12 1132 11/23/12 0829  NA 133* 133* 133*  K 2.9* 3.5 3.4*  CL 95* 97 98  CO2 26 25 23   GLUCOSE 182* 175* 127*  BUN 22 39* 50*  CREATININE 4.63* 6.15* 7.08*  CALCIUM 7.6* 8.0* 7.6*   Liver Function Tests:  Recent Labs Lab 11/22/12 1132  AST 22  ALT 14  ALKPHOS 103  BILITOT 1.8*  PROT 5.6*  ALBUMIN 1.2*   No results found for this basename: LIPASE, AMYLASE,  in the last 168 hours  Recent Labs Lab 11/21/12 1445  AMMONIA 18   CBC:  Recent Labs Lab 11/21/12 1025 11/21/12 2206 11/22/12 1132 11/22/12 1919 11/23/12 0227 11/23/12 0829  WBC 6.4 6.5 7.3 6.3 7.2 7.2  NEUTROABS 4.8  --   --   --   --   --   HGB 6.0* 6.6* 7.9* 7.3* 7.1* 7.5*  HCT 18.0* 19.4* 23.2* 21.7* 20.7* 21.7*  MCV 95.2 92.8 89.9 90.0 90.0 88.6  PLT 127* 123* 135* 132* 137* 121*   Cardiac Enzymes:  Recent Labs Lab 11/21/12 1145  TROPONINI <0.30   CBG:  Recent Labs Lab 11/21/12 2135 11/21/12 2257 11/22/12 2149 11/22/12 2331 11/23/12 0355  GLUCAP 211* 87 235* 217* 169*    Iron Studies: No results found for this basename: IRON, TIBC, TRANSFERRIN, FERRITIN,  in the last 72 hours Studies/Results: Dg Chest Portable 1 View  11/21/2012   *RADIOLOGY REPORT*  Clinical Data: Weakness and  shortness of breath.  PORTABLE CHEST - 1 VIEW  Comparison: Single view of the chest 10/29/2012 and 10/20/2012.  Findings: Right lower lobe airspace disease seen on the most recent study has improved.  There is patchy airspace disease in the left lung base.  No pneumothorax or pleural fluid is identified.  Heart size is upper normal.  IMPRESSION: Improved right lower lobe airspace disease with patchy airspace opacity in the left lung base which could be due to atelectasis or pneumonia.   Original Report Authenticated By: Holley Dexter, M.D.   Medications: Infusions: . sodium chloride 500 mL (11/22/12 1522)    Scheduled Medications: . darbepoetin (ARANESP) injection - DIALYSIS  150 mcg Intravenous Q Thu-HD  . doxercalciferol  3 mcg Intravenous Q T,Th,Sa-HD  . ferric gluconate (FERRLECIT/NULECIT) IV  125 mg Intravenous Once  . insulin aspart  0-15 Units Subcutaneous Q4H  . pantoprazole (PROTONIX) IV  40 mg Intravenous Q12H    have reviewed scheduled and prn medications.  Physical Exam: General: NAD Heart: slightly tachy Lungs: mostly clear Abdomen: soft, non tender but  distended Extremities: no edema Dialysis Access: left AVF   Assessment/Plan: 62 year old white male with end-stage renal disease and also cirrhosis and GAVE. He now presents with upper GI bleeding.  1 upper GI bleeding: Per GI. Status post 2 units transfusion yest with appropriate bump in hemoglobin. But now drifting down again, another 2 units today and follow. We'll use no heparin with dialysis. EGD did not show acutely bleeding lesion 2 ESRD: His regular days are TTS via an AV fistula. Done today- no heparin and transfused one unit blood  3 Hypertension: Blood pressure controlled on no medications. He is right at his dry weight. It's difficult to tell his volume status as he does have abdominal distention likely ascites but no peripheral edema. Not tolerating UF very well today.  4. Anemia of ESRD: He now has acute blood  loss status post transfusions. Will maximize ESA.  5. Metabolic Bone Disease: Is normally on Hectorol with dialysis and now will be ordered. Is also normally on Fosrenol. We will resume that if he starts eating.  6. Dispo- likely needs to stay until hgb stabilizes    Timothy Chan A   11/23/2012,9:36 AM  LOS: 2 days

## 2012-11-23 NOTE — Procedures (Signed)
Patient was seen on dialysis and the procedure was supervised.  BFR 350  Via AVF BP is  84/39.   Patient appears to be tolerating treatment well  Corene Resnick A 11/23/2012

## 2012-11-23 NOTE — Progress Notes (Signed)
[facility] Progress Note [place] TEAM 1 - Stepdown/ICU TEAM   Timothy Chan [id number] DOB: 10/26/1950 DOA: 11/21/2012 PCP: Ignatius Specking., MD  Brief narrative:   Timothy Chan is a 62 y.o. male with PMH of ESRD previously on PD now on HD TTS, during his dialysis treatment today felt very weak, sick, nauseous and was sent to the ER 1 hour before completing his HD treatment. Upon evaluation in ER he was briefly hypotensive, improved with NS bolus.  He was found to have Hb of 6.0 which was a drop from 8.9 taken 2 weeks prior. His hemoccult is positive. Pt reports h/o black stools off and on for past 2 weeks. denies NSAID, ETOH use. Last EGD by Dr.Rahman 4/11 noted : Multiple gastric antral vascular ectasia; 2 with active bleeding/oozing. Several of these lesions were ablated with APC then.   Assessment/Plan:  Active Problems:  Acute blood loss anemia - Hgb subtle drift down so add'l blood with HD today -has rec'd total 6 PRBC's this admit - serial CBC  GI bleed/GAVE (gastric antral vascular ectasia) - EGD 5/14 shows stable GAVE and mild grade 1 distal esophagitis but NO ACTIVE bleeding -cont. 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) - Negative for Hep B - Positive ethanol in system at admit, advised patient not to consume alcohol - manage outpatient  Diabetes Mellitus type 2, uncontrolled, with complications - Currently controlled, manage outpatient - cont SSI -resume glucotrol  CKD (chronic kidney disease) stage V requiring chronic dialysis - Etiology: hepato-renal syndrome  - monitor fluid status, urine output, and serum electrolytes closely - Nephrology  following- HD TTSa -was on Lasix pre admit but has started HD so can stop at dc  Thrombocytopenia, unspecified - Etiology: liver cirrhosis - PT elevated slightly, INR normal - advise to no smoke or drink, folate and B12 recommended in diet   Chronic pain/back -mobilize -not well controled with home Ultram so 1x dose IV MSO4 and then prn Oxy IR while IP   DVT prophylaxis: SCDs Code Status:  DNR Family Communication: patient in HD Disposition Plan: Transfer to 6700 Isolation: none  Consultants: GI, nephrology    Procedures: none    Antibiotics: none   HPI/Subjective: Patient alert and examined in HD.NO SOB or CP but endorsed low back pain.   Objective: Blood pressure 99/47, pulse 110, temperature 98.1 F (36.7 C), temperature source Oral, resp. rate 15, height 6\' 3"  (1.905 m), weight 103.3 kg (227 lb 11.8 oz), SpO2 96.00%.  Intake/Output Summary (Last 24 hours) at 11/23/12 1420 Last data filed at 11/23/12 1226  Gross per 24 hour  Intake 1097.5 ml  Output   1292 ml  Net -194.5 ml     Exam: General: No acute respiratory distress, moderately jaundiced Lungs: Clear to auscultation bilaterally without wheezes or crackles, RA Cardiovascular: Regular rate and rhythm without murmur gallop or rub normal S1 and S2, no peripheral edema or JVD Abdomen: Nontender, ascites, moderately distended, bowel sounds positive, no rebound, appreciable hematoma with scattered ecchymosis lower abdomen (periumbilical)  Musculoskeletal: No significant cyanosis, clubbing of bilateral lower extremities Neurological: Alert and oriented x 3, moves all extremities x 4 without focal neurological deficits,  CN 2-12 intact  Data Reviewed: Basic Metabolic Panel:  Recent Labs Lab 11/21/12 1025 11/22/12 1132 11/23/12 0829  NA 133* 133* 133*  K 2.9* 3.5 3.4*  CL 95* 97 98  CO2 26 25 23   GLUCOSE 182* 175* 127*  BUN 22 39* 50*  CREATININE 4.63* 6.15* 7.08*  CALCIUM 7.6* 8.0* 7.6*  PHOS  --    --  5.2*   Liver Function Tests:  Recent Labs Lab 11/22/12 1132 11/23/12 0829  AST 22  --   ALT 14  --   ALKPHOS 103  --   BILITOT 1.8*  --   PROT 5.6*  --   ALBUMIN 1.2* 1.2*   No results found for this basename: LIPASE, AMYLASE,  in the last 168 hours  Recent Labs Lab 11/21/12 1445  AMMONIA 18   CBC:  Recent Labs Lab 11/21/12 1025 11/21/12 2206 11/22/12 1132 11/22/12 1919 11/23/12 0227 11/23/12 0829  WBC 6.4 6.5 7.3 6.3 7.2 7.2  NEUTROABS 4.8  --   --   --   --   --   HGB 6.0* 6.6* 7.9* 7.3* 7.1* 7.5*  HCT 18.0* 19.4* 23.2* 21.7* 20.7* 21.7*  MCV 95.2 92.8 89.9 90.0 90.0 88.6  PLT 127* 123* 135* 132* 137* 121*   Cardiac Enzymes:  Recent Labs Lab 11/21/12 1145  TROPONINI <0.30   BNP (last 3 results)  Recent Labs  11/21/12 1145  PROBNP 1167.0*   CBG:  Recent Labs Lab 11/21/12 2135 11/21/12 2257 11/22/12 2149 11/22/12 2331 11/23/12 0355  GLUCAP 211* 87 235* 217* 169*    Recent Results (from the past 240 hour(s))  MRSA PCR SCREENING     Status: None   Collection Time    11/21/12  6:23 PM      Result Value Range Status   MRSA by PCR NEGATIVE  NEGATIVE Final   Comment:            The GeneXpert MRSA Assay (FDA     approved for NASAL specimens     only), is one component of a     comprehensive MRSA colonization     surveillance program. It is not     intended to diagnose MRSA     infection nor to guide or     monitor treatment for     MRSA infections.     Studies:  Recent x-ray studies have been reviewed in detail by the Attending Physician  Scheduled Meds:  Reviewed in detail by the Attending Physician   Junious Silk, ANP [facility] Office  [phone] Pager [phone]  On-Call/Text Page:      [email or web address]      password [email or web address]  If 7PM-7AM, please contact night-coverage [email or web address] Password [email or web address] 11/23/2012, 2:20 PM   LOS: 2 days   I have examined the patient, reviewed the chart and modified the above note  which I agree with.   Kandance Yano,MD [phone] 11/23/2012, 4:08 PM

## 2012-11-24 DIAGNOSIS — K922 Gastrointestinal hemorrhage, unspecified: Secondary | ICD-10-CM

## 2012-11-24 LAB — CBC
MCH: 29.8 pg (ref 26.0–34.0)
MCHC: 33.5 g/dL (ref 30.0–36.0)
Platelets: 107 10*3/uL — ABNORMAL LOW (ref 150–400)

## 2012-11-24 LAB — GLUCOSE, CAPILLARY
Glucose-Capillary: 152 mg/dL — ABNORMAL HIGH (ref 70–99)
Glucose-Capillary: 188 mg/dL — ABNORMAL HIGH (ref 70–99)
Glucose-Capillary: 198 mg/dL — ABNORMAL HIGH (ref 70–99)

## 2012-11-24 LAB — COMPREHENSIVE METABOLIC PANEL
ALT: 11 U/L (ref 0–53)
AST: 26 U/L (ref 0–37)
CO2: 23 mEq/L (ref 19–32)
Chloride: 101 mEq/L (ref 96–112)
GFR calc non Af Amer: 12 mL/min — ABNORMAL LOW (ref >90)
Sodium: 135 mEq/L (ref 135–145)
Total Bilirubin: 0.7 mg/dL (ref 0.3–1.2)

## 2012-11-24 MED ORDER — ONDANSETRON HCL 4 MG PO TABS: 4.0000 mg | ORAL_TABLET | Freq: Three times a day (TID) | ORAL | Status: DC | PRN

## 2012-11-24 MED ORDER — CIPROFLOXACIN HCL 500 MG PO TABS
500.0000 mg | ORAL_TABLET | Freq: Every day | ORAL | Status: DC
  Administered 2012-11-24 – 2012-11-25 (×2): 500 mg via ORAL
  Filled 2012-11-24 (×2): qty 1

## 2012-11-24 MED ORDER — PANTOPRAZOLE SODIUM 40 MG PO TBEC
40.0000 mg | DELAYED_RELEASE_TABLET | Freq: Two times a day (BID) | ORAL | Status: DC
  Administered 2012-11-24 – 2012-11-25 (×3): 40 mg via ORAL
  Filled 2012-11-24 (×2): qty 1

## 2012-11-24 MED ORDER — INSULIN ASPART 100 UNIT/ML ~~LOC~~ SOLN: 0.0000 [IU] | Freq: Every day | SUBCUTANEOUS | Status: DC

## 2012-11-24 MED ORDER — RENA-VITE PO TABS
1.0000 | ORAL_TABLET | Freq: Every day | ORAL | Status: DC
  Administered 2012-11-24 – 2012-11-25 (×2): 1 via ORAL
  Filled 2012-11-24 (×2): qty 1

## 2012-11-24 MED ORDER — INSULIN ASPART 100 UNIT/ML ~~LOC~~ SOLN
0.0000 [IU] | Freq: Three times a day (TID) | SUBCUTANEOUS | Status: DC
  Administered 2012-11-25: 3 [IU] via SUBCUTANEOUS

## 2012-11-24 MED ORDER — LANTHANUM CARBONATE 500 MG PO CHEW
1000.0000 mg | CHEWABLE_TABLET | Freq: Three times a day (TID) | ORAL | Status: DC
  Administered 2012-11-24 – 2012-11-25 (×3): 1000 mg via ORAL
  Filled 2012-11-24 (×7): qty 2

## 2012-11-24 MED ORDER — LACTULOSE 10 GM/15ML PO SOLN
20.0000 g | Freq: Two times a day (BID) | ORAL | Status: DC
  Administered 2012-11-24 (×2): 20 g via ORAL
  Filled 2012-11-24 (×6): qty 30

## 2012-11-24 NOTE — Progress Notes (Signed)
Chart reviewed. Patient known to me from previous hospitalization.  [facility] Progress Note  Timothy Chan:[id number] DOB: 1951/06/28 DOA: 11/21/2012 PCP: Ignatius Specking., MD  Brief narrative:   Timothy Chan is a 62 y.o. male with PMH of ESRD previously on PD now on HD TTS, during his dialysis treatment today felt very weak, sick, nauseous and was sent to the ER 1 hour before completing his HD treatment. Upon evaluation in ER he was briefly hypotensive, improved with NS bolus.  He was found to have Hb of 6.0 which was a drop from 8.9 taken 2 weeks prior. His hemoccult is positive. Pt reports h/o black stools off and on for past 2 weeks. denies NSAID, ETOH use. Last EGD by Dr.Rahman 4/11 noted : Multiple gastric antral vascular ectasia; 2 with active bleeding/oozing. Several of these lesions were ablated with APC then.  Assessment/Plan:  Active Problems:  Acute blood loss anemia Hemoglobin still borderline. We'll transfuse another unit of blood tomorrow with dialysis. No further gross bleeding however.  GI bleed/GAVE (gastric antral vascular ectasia) - EGD 5/14 shows stable GAVE and mild grade 1 distal esophagitis but NO ACTIVE bleeding Changed protonix to by mouth.  Alcoholic cirrhosis/remote TIPS Resume lactulose. Abdomen is more distended than during last hospitalization. I don't see that he had a positive blood alcohol during this hospitalization. Last month, patient was hospitalized for peritonitis related to peritoneal dialysis. Per his gastroenterologist, he is to be on lifelong SBP prophylaxis. Will resume Cipro.  Diabetes Mellitus type 2, uncontrolled, with complications Continue current  End-stage renal disease: Hemodialysis tomorrow  Thrombocytopenia, unspecified Chronic  Disposition: Patient actually looks much better than he did last month at [facility]. He does not want to go back to skilled nursing facility. Will order home health nursing and physical  therapy. Likely home tomorrow   DVT prophylaxis: SCDs Code Status:  DNR Family Communication: none today  Consultants: GI, nephrology   HPI/Subjective: "Why can't I do to home". No bleeding. In fact no bowel movement.  Objective: Blood pressure 100/47, pulse 121, temperature 97.6 F (36.4 C), temperature source Oral, resp. rate 16, height 6\' 3"  (1.905 m), weight 103.3 kg (227 lb 11.8 oz), SpO2 94.00%.  Intake/Output Summary (Last 24 hours) at 11/24/12 1416 Last data filed at 11/24/12 0855  Gross per 24 hour  Intake    420 ml  Output      0 ml  Net    420 ml   Exam: General: Comfortable. Alert, oriented and appropriate. Lungs: Clear to auscultation bilaterally without wheezes or crackles, RA Cardiovascular: Regular rate and rhythm without murmur gallop or rub normal S1 and S2, no peripheral edema or JVD Abdomen: Nontender, ascites, moderately distended, bowel sounds positive, no rebound, appreciable hematoma with scattered ecchymosis lower abdomen (periumbilical)  Musculoskeletal: No significant cyanosis, clubbing of bilateral lower extremities Neurological: Alert and oriented x 3, moves all extremities x 4 without focal neurological deficits, CN 2-12 intact  Data Reviewed: Basic Metabolic Panel:  Recent Labs Lab 11/21/12 1025 11/22/12 1132 11/23/12 0829 11/24/12 0545  NA 133* 133* 133* 135  K 2.9* 3.5 3.4* 3.6  CL 95* 97 98 101  CO2 26 25 23 23   GLUCOSE 182* 175* 127* 125*  BUN 22 39* 50* 28*  CREATININE 4.63* 6.15* 7.08* 4.67*  CALCIUM 7.6* 8.0* 7.6* 7.7*  PHOS  --   --  5.2*  --    Liver Function Tests:  Recent Labs Lab 11/22/12 1132 11/23/12 0829 11/24/12  0545  AST 22  --  26  ALT 14  --  11  ALKPHOS 103  --  99  BILITOT 1.8*  --  0.7  PROT 5.6*  --  5.0*  ALBUMIN 1.2* 1.2* 1.2*   No results found for this basename: LIPASE, AMYLASE,  in the last 168 hours  Recent Labs Lab 11/21/12 1445  AMMONIA 18   CBC:  Recent Labs Lab 11/21/12 1025   11/22/12 1132 11/22/12 1919 11/23/12 0227 11/23/12 0829 11/24/12 0545  WBC 6.4  < > 7.3 6.3 7.2 7.2 6.5  NEUTROABS 4.8  --   --   --   --   --   --   HGB 6.0*  < > 7.9* 7.3* 7.1* 7.5* 7.7*  HCT 18.0*  < > 23.2* 21.7* 20.7* 21.7* 23.0*  MCV 95.2  < > 89.9 90.0 90.0 88.6 89.1  PLT 127*  < > 135* 132* 137* 121* 107*  < > = values in this interval not displayed. Cardiac Enzymes:  Recent Labs Lab 11/21/12 1145  TROPONINI <0.30   BNP (last 3 results)  Recent Labs  11/21/12 1145  PROBNP 1167.0*   CBG:  Recent Labs Lab 11/23/12 2144 11/24/12 0101 11/24/12 0551 11/24/12 0756 11/24/12 1224  GLUCAP 176* 104* 120* 138* 152*    Recent Results (from the past 240 hour(s))  MRSA PCR SCREENING     Status: None   Collection Time    11/21/12  6:23 PM      Result Value Range Status   MRSA by PCR NEGATIVE  NEGATIVE Final   Comment:            The GeneXpert MRSA Assay (FDA     approved for NASAL specimens     only), is one component of a     comprehensive MRSA colonization     surveillance program. It is not     intended to diagnose MRSA     infection nor to guide or     monitor treatment for     MRSA infections.    [name],MD [phone]  11/24/2012, 2:16 PM

## 2012-11-24 NOTE — Progress Notes (Signed)
Subjective:  Back to being cranky- "the stomach MD's said they were going to give Chan med to coat my stomach- where is it?'  "I cant afford this" his hgb did not rise signif in spite of 2 more units transfused.   Objective Vital signs in last 24 hours: Filed Vitals:   11/24/12 0424 11/24/12 0922 11/24/12 0928 11/24/12 0933  BP: 103/60 95/76 125/62 100/47  Pulse: 109 108 108 121  Temp: 97.6 F (36.4 C)     TempSrc: Oral     Resp: 16     Height:      Weight:      SpO2: 94%      Weight change:   Intake/Output Summary (Last 24 hours) at 11/24/12 1140 Last data filed at 11/24/12 0855  Gross per 24 hour  Intake    420 ml  Output   1292 ml  Net   -872 ml   Labs: Basic Metabolic Panel:  Recent Labs Lab 11/22/12 1132 11/23/12 0829 11/24/12 0545  NA 133* 133* 135  K 3.5 3.4* 3.6  CL 97 98 101  CO2 25 23 23   GLUCOSE 175* 127* 125*  BUN 39* 50* 28*  CREATININE 6.15* 7.08* 4.67*  CALCIUM 8.0* 7.6* 7.7*  PHOS  --  5.2*  --    Liver Function Tests:  Recent Labs Lab 11/22/12 1132 11/23/12 0829 11/24/12 0545  AST 22  --  26  ALT 14  --  11  ALKPHOS 103  --  99  BILITOT 1.8*  --  0.7  PROT 5.6*  --  5.0*  ALBUMIN 1.2* 1.2* 1.2*   No results found for this basename: LIPASE, AMYLASE,  in the last 168 hours  Recent Labs Lab 11/21/12 1445  AMMONIA 18   CBC:  Recent Labs Lab 11/21/12 1025  11/22/12 1132 11/22/12 1919 11/23/12 0227 11/23/12 0829 11/24/12 0545  WBC 6.4  < > 7.3 6.3 7.2 7.2 6.5  NEUTROABS 4.8  --   --   --   --   --   --   HGB 6.0*  < > 7.9* 7.3* 7.1* 7.5* 7.7*  HCT 18.0*  < > 23.2* 21.7* 20.7* 21.7* 23.0*  MCV 95.2  < > 89.9 90.0 90.0 88.6 89.1  PLT 127*  < > 135* 132* 137* 121* 107*  < > = values in this interval not displayed. Cardiac Enzymes:  Recent Labs Lab 11/21/12 1145  TROPONINI <0.30   CBG:  Recent Labs Lab 11/23/12 0355 11/23/12 2144 11/24/12 0101 11/24/12 0551 11/24/12 0756  GLUCAP 169* 176* 104* 120* 138*    Iron  Studies: No results found for this basename: IRON, TIBC, TRANSFERRIN, FERRITIN,  in the last 72 hours Studies/Results: No results found. Medications: Infusions:    Scheduled Medications: . darbepoetin (ARANESP) injection - DIALYSIS  150 mcg Intravenous Q Thu-HD  . doxercalciferol  3 mcg Intravenous Q T,Th,Sa-HD  . glipiZIDE  5 mg Oral BID WC  . insulin aspart  0-15 Units Subcutaneous Q4H  . pantoprazole (PROTONIX) IV  40 mg Intravenous Q12H    have reviewed scheduled and prn medications.  Physical Exam: General: NAD Heart: slightly tachy Lungs: mostly clear Abdomen: soft, non tender but distended Extremities: no edema Dialysis Access: left AVF   Assessment/Plan: 62 year old white male with end-stage renal disease and also cirrhosis and GAVE. He now presents with upper GI bleeding.  1 upper GI bleeding: Per GI. Status post 2 units transfusion yest with appropriate bump in hemoglobin. But  now drifting down again, another 2 units Thursday and follow. We'll use no heparin with dialysis. EGD did not show acutely bleeding lesion.  Will plan to go ahead and order transfusion with dialysis tomorrow as well.  2 ESRD: His regular days are TTS via an AV fistula at the [facility] unit. - no heparin   3 Hypertension: Blood pressure controlled on no medications. He is right at his dry weight. It's difficult to tell his volume status as he does have abdominal distention likely ascites but no peripheral edema. Not tolerating UF very well today.  4. Anemia of ESRD: He now has acute blood loss status post transfusions. Will maximize ESA.  5. Metabolic Bone Disease: Is normally on Hectorol with dialysis and now will be ordered. Is also normally on Fosrenol. We will resume that if he starts eating.  6. Dispo- likely needs to stay until hgb stabilizes.  Have mentioned to primary about med that GI MD promised    Timothy Chan   11/24/2012,11:40 AM  LOS: 3 days

## 2012-11-24 NOTE — Progress Notes (Signed)
PT Cancellation Note  Patient Details Name: LAVANCE BEAZER MRN: [id number] DOB: 11/01/1950   Cancelled Treatment:    Reason Eval/Treat Not Completed: Other (comment) (pt refused PT).  Stated he had been up multiple times today.   INGOLD,Chantilly Linskey 11/24/2012, 4:10 PM [facility] Acute Rehabilitation [phone] (pager)

## 2012-11-24 NOTE — Care Management Note (Signed)
   CARE MANAGEMENT NOTE 11/24/2012  Patient:  Timothy Chan, Timothy Chan   Account Number:  [id number]  Date Initiated:  11/22/2012  Documentation initiated by:  Timothy Chan  Subjective/Objective Assessment:   Pt admitted with LGIB/anemia     Action/Plan:   PTA pt was at [facility]- CSW consulted for placement needs  11/24/2012 Met with pt and plan as noted below, pt selected [facility] for [facility] services. [facility] notified.   Anticipated DC Date:  11/26/2012   Anticipated DC Plan:  HOME W HOME HEALTH SERVICES  In-house referral  Clinical Social Worker      DC Planning Services  CM consult      [facility] Choice  HOME HEALTH   Choice offered to / List presented to:  C-1 Patient        HH arranged  HH-1 RN  HH-2 PT      [facility] agency  [facility]   Status of service:  In process, will continue to follow Medicare Important Message given?   (If response is "NO", the following Medicare IM given date fields will be blank) Date Medicare IM given:   Date Additional Medicare IM given:    Discharge Disposition:    Per UR Regulation:  Reviewed for med. necessity/level of care/duration of stay  If discussed at Long Length of Stay Meetings, dates discussed:    Comments:  11/24/2012  Pt selected [facility] for HH needs, [facility] notified, await orders. Pt not likely to d/c today will continue to follow. Timothy Bakos RN MPH  11/23/12- 1430- Timothy Pierini RN, BSN [phone] Pt admitted from SNF- received word from CSW that pt did not plan to return to SNF at discharge- in to speak with pt at bedside- per conversation pt confirmed that he was at [facility] for ST rehab and that his plans at discharge was to return home. Pt states that he has family and friends that will be able to assist at discharge and someone will be able to stay with him 24/7 to assist him. He states that he already has needed DME at home that includes cane, walker and w/c. Pt would benefit from [facility] services and is agreeable to them- he was going to have HH  upon leaving the facility- list of [facility] agencies for [facility] given to pt for review- NCM to follow up with pt for choice prior to d/c. MD please order [facility]- RN/PT/OT/aide/CSW for discharge if agree-

## 2012-11-24 NOTE — Evaluation (Signed)
Occupational Therapy Evaluation Patient Details Name: Timothy Chan MRN: [id number] DOB: 1951-02-24 Today's Date: 11/24/2012 Time: 4098-1191 OT Time Calculation (min): 37 min  OT Assessment / Plan / Recommendation Clinical Impression  This 62 yo male admitted with weakness, vomiting, GIB (total Hgb is 7.7) presents to acute OT with problems below. Will benefit from acute OT with follow up HHOT.    OT Assessment  Patient needs continued OT Services    Follow Up Recommendations  Home health OT    Barriers to Discharge None    Equipment Recommendations  None recommended by OT       Frequency  Min 2X/week    Precautions / Restrictions Precautions Precautions: Fall Precaution Comments: orthostatic in standing Restrictions Weight Bearing Restrictions: No   Pertinent Vitals/Pain Pt was orthostatic from sit to stand (see doc flow sheets)    ADL  Eating/Feeding: Simulated;Independent Where Assessed - Eating/Feeding: Edge of bed Grooming: Simulated;Set up Where Assessed - Grooming: Unsupported sitting Upper Body Bathing: Simulated;Set up Where Assessed - Upper Body Bathing: Unsupported sitting Lower Body Bathing: Simulated;Minimal assistance Where Assessed - Lower Body Bathing: Unsupported sit to stand Upper Body Dressing: Simulated;Set up Where Assessed - Upper Body Dressing: Unsupported sitting Lower Body Dressing: Simulated;Minimal assistance Where Assessed - Lower Body Dressing: Unsupported sit to stand Toilet Transfer: Simulated;Minimal assistance Toilet Transfer Method: Sit to stand Toilet Transfer Equipment:  (Bed around to other side of bed) Toileting - Clothing Manipulation and Hygiene: Simulated;Min guard Where Assessed - Glass blower/designer Manipulation and Hygiene: Standing Equipment Used: Rolling walker;Gait belt Transfers/Ambulation Related to ADLs: Min guard A    OT Diagnosis: Generalized weakness;Acute pain  OT Problem List: Decreased strength;Decreased  activity tolerance;Pain;Decreased knowledge of use of DME or AE OT Treatment Interventions: Self-care/ADL training;Balance training;Patient/family education;DME and/or AE instruction   OT Goals Acute Rehab OT Goals OT Goal Formulation: With patient Time For Goal Achievement: 11/24/12 Potential to Achieve Goals: Good ADL Goals Pt Will Perform Grooming: with modified independence;Unsupported;Standing at sink ADL Goal: Grooming - Progress: Goal set today Pt Will Perform Upper Body Bathing: with modified independence;Unsupported;Standing at sink;Sitting at sink ADL Goal: Upper Body Bathing - Progress: Goal set today Pt Will Perform Lower Body Bathing: with modified independence;Unsupported;with adaptive equipment;Standing at sink;Sitting at sink ADL Goal: Lower Body Bathing - Progress: Goal set today Pt Will Perform Upper Body Dressing: with modified independence;Unsupported;Sit to stand from chair;Sit to stand from bed ADL Goal: Upper Body Dressing - Progress: Goal set today Pt Will Perform Lower Body Dressing: with modified independence;Unsupported;Sit to stand from chair;Sit to stand from bed;with adaptive equipment ADL Goal: Lower Body Dressing - Progress: Goal set today Pt Will Transfer to Toilet: with modified independence;Ambulation;with DME;Regular height toilet;Comfort height toilet;3-in-1;Grab bars ADL Goal: Toilet Transfer - Progress: Goal set today Pt Will Perform Toileting - Clothing Manipulation: Independently;Standing ADL Goal: Toileting - Clothing Manipulation - Progress: Goal set today Pt Will Perform Toileting - Hygiene: Independently;Sit to stand from 3-in-1/toilet ADL Goal: Toileting - Hygiene - Progress: Goal set today  Visit Information  Last OT Received On: 11/24/12 Assistance Needed: +1    Subjective Data  Subjective: I will have someone with me all the time at home   Prior Functioning     Home Living Lives With: Alone Available Help at Discharge:  Family;Available 24 hours/day Type of Home: House Home Access: Stairs to enter;Ramped entrance Entrance Stairs-Number of Steps: 1 Entrance Stairs-Rails: Right Home Layout: One level Bathroom Shower/Tub: Forensic scientist: Standard Home Adaptive Equipment:  Walker - rolling;Straight cane;Wheelchair - manual;Shower chair with back;Bedside commode/3-in-1 Prior Function Level of Independence: Independent with assistive device(s) Able to Take Stairs?: Yes Driving: Yes Vocation: Retired Comments: was in Microbiologist: No difficulties Dominant Hand: Right         Vision/Perception Vision - History Baseline Vision: No visual deficits Patient Visual Report: No change from baseline   Cognition  Cognition Arousal/Alertness: Awake/alert Behavior During Therapy: WFL for tasks assessed/performed Overall Cognitive Status: Within Functional Limits for tasks assessed    Extremity/Trunk Assessment Right Upper Extremity Assessment RUE ROM/Strength/Tone: Within functional levels Left Upper Extremity Assessment LUE ROM/Strength/Tone: Within functional levels     Mobility Bed Mobility Bed Mobility: Supine to Sit;Sitting - Scoot to [place] of Bed;Sit to Supine;Scooting to [facility] Supine to Sit: 6: Modified independent (Device/Increase time);With rails;HOB elevated (20 degrees) Sitting - Scoot to Edge of Bed: 6: Modified independent (Device/Increase time);With rail Sit to Supine: 6: Modified independent (Device/Increase time);With rail;HOB flat Scooting to HOB: 6: Modified independent (Device/Increase time);With rail Transfers Transfers: Sit to Stand;Stand to Sit Sit to Stand: 4: Min guard;With upper extremity assist;From bed Stand to Sit: 4: Min guard;With upper extremity assist;To bed Details for Transfer Assistance: VCs for safe hand placement           End of Session OT - End of Session Equipment Utilized During Treatment: Gait  belt Activity Tolerance: Patient limited by pain Patient left: in bed;with call bell/phone within reach;with bed alarm set    Evette Georges [phone] 11/24/2012, 9:53 AM

## 2012-11-24 NOTE — Progress Notes (Signed)
[facility]  Patient Status: Active (receiving services up to time of hospitalization)  [facility] is providing the following services: RN and PT  If patient discharges after hours, please call [phone].   Timothy Chan 11/24/2012, 2:44 PM

## 2012-11-25 LAB — RENAL FUNCTION PANEL
Albumin: 1.3 g/dL — ABNORMAL LOW (ref 3.5–5.2)
Calcium: 8.2 mg/dL — ABNORMAL LOW (ref 8.4–10.5)
GFR calc Af Amer: 10 mL/min — ABNORMAL LOW (ref >90)
Glucose, Bld: 133 mg/dL — ABNORMAL HIGH (ref 70–99)
Phosphorus: 4.9 mg/dL — ABNORMAL HIGH (ref 2.3–4.6)
Potassium: 3.4 mEq/L — ABNORMAL LOW (ref 3.5–5.1)
Sodium: 137 mEq/L (ref 135–145)

## 2012-11-25 LAB — CBC
MCH: 29.8 pg (ref 26.0–34.0)
MCHC: 33.2 g/dL (ref 30.0–36.0)
Platelets: 120 10*3/uL — ABNORMAL LOW (ref 150–400)
RDW: 18.4 % — ABNORMAL HIGH (ref 11.5–15.5)

## 2012-11-25 LAB — GLUCOSE, CAPILLARY
Glucose-Capillary: 127 mg/dL — ABNORMAL HIGH (ref 70–99)
Glucose-Capillary: 190 mg/dL — ABNORMAL HIGH (ref 70–99)

## 2012-11-25 LAB — TYPE AND SCREEN
ABO/RH(D): B NEG
Antibody Screen: NEGATIVE
Unit division: 0
Unit division: 0

## 2012-11-25 LAB — PREPARE RBC (CROSSMATCH)

## 2012-11-25 MED ORDER — DOXERCALCIFEROL 4 MCG/2ML IV SOLN
INTRAVENOUS | Status: AC
  Administered 2012-11-25: 3 ug via INTRAVENOUS
  Filled 2012-11-25: qty 2

## 2012-11-25 MED ORDER — LANTHANUM CARBONATE 1000 MG PO CHEW: 1000.0000 mg | CHEWABLE_TABLET | Freq: Three times a day (TID) | ORAL | Status: DC

## 2012-11-25 MED ORDER — POTASSIUM CHLORIDE ER 10 MEQ PO TBCR: 10.0000 meq | EXTENDED_RELEASE_TABLET | ORAL | Status: DC

## 2012-11-25 MED ORDER — BISACODYL 10 MG RE SUPP
10.0000 mg | Freq: Once | RECTAL | Status: DC
  Filled 2012-11-25: qty 1

## 2012-11-25 MED ORDER — CIPROFLOXACIN HCL 500 MG PO TABS: 500.0000 mg | ORAL_TABLET | Freq: Every day | ORAL | Status: DC

## 2012-11-25 MED ORDER — NEPHRO-VITE 0.8 MG PO TABS: 0.8000 mg | ORAL_TABLET | Freq: Every day | ORAL | Status: DC

## 2012-11-25 MED ORDER — LACTULOSE 10 GM/15ML PO SOLN: 20.0000 g | Freq: Two times a day (BID) | ORAL | Status: DC

## 2012-11-25 MED ORDER — OMEPRAZOLE 40 MG PO CPDR: 40.0000 mg | DELAYED_RELEASE_CAPSULE | Freq: Every day | ORAL | Status: DC

## 2012-11-25 MED ORDER — SODIUM CHLORIDE 0.9 % IV SOLN: 100.0000 mL | INTRAVENOUS | Status: DC | PRN

## 2012-11-25 MED ORDER — ZOLPIDEM TARTRATE 5 MG PO TABS
5.0000 mg | ORAL_TABLET | Freq: Once | ORAL | Status: AC
  Administered 2012-11-25: 5 mg via ORAL
  Filled 2012-11-25: qty 1

## 2012-11-25 MED ORDER — ALTEPLASE 2 MG IJ SOLR
2.0000 mg | Freq: Once | INTRAMUSCULAR | Status: DC | PRN
  Filled 2012-11-25: qty 2

## 2012-11-25 MED ORDER — GLIPIZIDE 5 MG PO TABS: 5.0000 mg | ORAL_TABLET | Freq: Two times a day (BID) | ORAL | Status: DC

## 2012-11-25 NOTE — Progress Notes (Signed)
Subjective:  On hd, feels better today/ he reports stool yesterday without melena.  On HD and getting blood this AM Objective Vital signs in last 24 hours: s better today Weight change: 1.1 kg (2 lb 6.8 oz)  Intake/Output Summary (Last 24 hours) at 11/25/12 0910 Last data filed at 11/24/12 2118  Gross per 24 hour  Intake    120 ml  Output    150 ml  Net    -30 ml   Labs: Basic Metabolic Panel:  Recent Labs Lab 11/22/12 1132 11/23/12 0829 11/24/12 0545  NA 133* 133* 135  K 3.5 3.4* 3.6  CL 97 98 101  CO2 25 23 23   GLUCOSE 175* 127* 125*  BUN 39* 50* 28*  CREATININE 6.15* 7.08* 4.67*  CALCIUM 8.0* 7.6* 7.7*  PHOS  --  5.2*  --    Liver Function Tests:  Recent Labs Lab 11/22/12 1132 11/23/12 0829 11/24/12 0545  AST 22  --  26  ALT 14  --  11  ALKPHOS 103  --  99  BILITOT 1.8*  --  0.7  PROT 5.6*  --  5.0*  ALBUMIN 1.2* 1.2* 1.2*   No results found for this basename: LIPASE, AMYLASE,  in the last 168 hours  Recent Labs Lab 11/21/12 1445  AMMONIA 18   CBC:  Recent Labs Lab 11/21/12 1025  11/22/12 1132 11/22/12 1919 11/23/12 0227 11/23/12 0829 11/24/12 0545  WBC 6.4  < > 7.3 6.3 7.2 7.2 6.5  NEUTROABS 4.8  --   --   --   --   --   --   HGB 6.0*  < > 7.9* 7.3* 7.1* 7.5* 7.7*  HCT 18.0*  < > 23.2* 21.7* 20.7* 21.7* 23.0*  MCV 95.2  < > 89.9 90.0 90.0 88.6 89.1  PLT 127*  < > 135* 132* 137* 121* 107*  < > = values in this interval not displayed. Cardiac Enzymes:  Recent Labs Lab 11/21/12 1145  TROPONINI <0.30   CBG:  Recent Labs Lab 11/24/12 0756 11/24/12 1224 11/24/12 1636 11/24/12 2000 11/25/12 0752  GLUCAP 138* 152* 188* 198* 127*    Iron Studies: No results found for this basename: IRON, TIBC, TRANSFERRIN, FERRITIN,  in the last 72 hours Studies/Results: No results found. Medications:   . bisacodyl  10 mg Rectal Once  . ciprofloxacin  500 mg Oral Daily  . darbepoetin (ARANESP) injection - DIALYSIS  150 mcg Intravenous Q Thu-HD   . doxercalciferol  3 mcg Intravenous Q T,Th,Sa-HD  . glipiZIDE  5 mg Oral BID WC  . insulin aspart  0-15 Units Subcutaneous TID WC  . insulin aspart  0-5 Units Subcutaneous QHS  . lactulose  20 g Oral BID  . lanthanum  1,000 mg Oral TID WC & HS  . multivitamin  1 tablet Oral Daily  . pantoprazole  40 mg Oral BID   Physical Exam: General: Alert, NAD , chromically ill WM on HD Heart: RRR, no rub or Murmur Lungs: CTA Abdomen: BS pos. Ascites/ somewhat distended, min. Tender throughout Extremities: Dialysis Access: NO pedal edam/ L arm AVF patent on HD   Dialyzes at [facility] on TTS Primary Nephrologist Dr. Fausto Skillern. EDW 102 kg.  HD Bath 3K 2.5 calcium, Heparin yes. Access left AV fistula.  Problem/Plan: 1.  Upper GI bleed + GI managing/ no heparin HD/ for 2 units prbcs (which will be total of 6 units this hosp) on hd with continued hgb drift down 2. ESRD -  TTS( [facility]) schedule/ no hep hd.  Pt tells me he may go home today with home health which would be a change in HD units from [facility] to [facility] ? The pt needs to call the [facility] unit on Monday AM to get the ball rolling on this- the kidney center did not think it would be a problem- worse case scenario pt would need to get to the [facility] unit on Tuesday until the transfer is complete.  3. Anemia - sec. To GI  Bleed And esrd/ chronic dz = Hgb 7.7 yesterday  For trnasfusion as above, Max Aranesp 4. Secondary hyperparathyroidism - Hectorol on hd and binder with meals/ labs pending pre hd today 5. HTN/volume - Problems with low Bp on Hd / about 1 kg over op edw by bedwt. attempting  2to 3 l uf on hd/ no bp meds  Lenny Pastel, PA-C [facility] Beeper [phone] 11/25/2012,9:10 AM  LOS: 4 days   Patient seen and examined, agree with above note with above modifications. Pt is somnolent on HD today.  I think the plan was for him to go home with home health maybe today.  See above  for changes to OP HD unit if does go home Annie Sable, MD 11/25/2012

## 2012-11-25 NOTE — Discharge Summary (Signed)
Physician Discharge Summary  Timothy Chan:[id number] DOB: 1950/10/04 DOA: 11/21/2012  PCP: Ignatius Specking., MD  Admit date: 11/21/2012 Discharge date: 11/25/2012  Time spent: 45 minutes  Discharge Diagnoses:  Active Problems:   Alcoholic cirrhosis/remote TIPS   Diabetes mellitus type 2, uncontrolled, with complications   Acute blood loss anemia   GI bleed   Thrombocytopenia, unspecified   GAVE (gastric antral vascular ectasia) ESRD Hypokalemia esophagitis  Discharge Condition: stable  Filed Weights   11/24/12 2003 11/25/12 0815 11/25/12 1245  Weight: 104.5 kg (230 lb 6.1 oz) 103.9 kg (229 lb 0.9 oz) 102.7 kg (226 lb 6.6 oz)    History of present illness:  Timothy Chan is a 62 y.o. male with PMH of ESRD previously on PD now on HD TTS, during his dialysis treatment today felt very weak, sick, nauseous and was sent to the ER 1 hour before completing his HD treatment.  Upon evaluation in ER he was briefly hypotensive, improved with NS bolus, Was found to have Hb of 6.0, drop from 8.9, 2weeks prior, hemoccult positive.  Pt reports h/o black stools off and on for past 2 weeks. denies NSAID, ETOH use.  Last EGD by Dr.Rahman 4/11 noted : Multiple gastric antral vascular ectasia; 2 with active bleeding/oozing. Several of these lesions were ablated with APC then.   Hospital Course:   Patient was admitted, transfused, given PPI.  Repeat EGD showed stable GAVE and mild grade 1 distal esophageal varicies.  No active bleeding.  Patient continued dialysis per previous.  Lactulose was resumed.  Cipro was resumed for SBP prophylaxis.  Patient had come from SNF, but refused transfer back.  Discharged home with home health RN.  Patient will call [facility] in [place] and switch to [facility] in [place].  Total time greater than 30 minutes.  Procedures:  EGD 5/14 shows stable GAVE, grade 1 distal esophagitis  Consultations:  Nephrology  GI, Mann  Discharge Exam: Filed Vitals:    11/25/12 1200 11/25/12 1229 11/25/12 1245 11/25/12 1340  BP: 96/51 93/55 102/51   Pulse: 116 118 118   Temp:   97.4 F (36.3 C)   TempSrc:   Oral Oral  Resp: 16 16 16 16   Height:      Weight:   102.7 kg (226 lb 6.6 oz)   SpO2:        General: nontoxic Cardiovascular: RRR Respiratory: CTA Abd distended.  Nontender.  Ext no CCE  Discharge Instructions  Discharge Orders   Future Appointments Provider Department Dept Phone   12/05/2012 11:00 AM Malissa Hippo, MD [facility] [phone]   Future Orders Complete By Expires     Diet - low sodium heart healthy  As directed     Discharge instructions  As directed     Comments:      Call [facility] in [place] Monday, so you can switch to [facility] in [place].  [phone]    Driving Restrictions  As directed     Comments:      No driving until cleared by your doctor    Walk with assistance  As directed         Medication List    STOP taking these medications       albuterol (5 MG/ML) 0.5% nebulizer solution  Commonly known as:  PROVENTIL     furosemide 80 MG tablet  Commonly known as:  LASIX     ondansetron 4 MG tablet  Commonly known as:  ZOFRAN      TAKE these medications       b complex-vitamin c-folic acid 0.8 MG Tabs  Take 1 tablet by mouth at bedtime.     ciprofloxacin 500 MG tablet  Commonly known as:  CIPRO  Take 1 tablet (500 mg total) by mouth daily.     glipiZIDE 5 MG tablet  Commonly known as:  GLUCOTROL  Take 1 tablet (5 mg total) by mouth 2 (two) times daily.     lactulose 10 GM/15ML solution  Commonly known as:  CHRONULAC  Take 30 mLs (20 g total) by mouth 2 (two) times daily.     lanthanum 1000 MG chewable tablet  Commonly known as:  FOSRENOL  Chew 1 tablet (1,000 mg total) by mouth 4 (four) times daily -  with meals and at bedtime.     omeprazole 40 MG capsule  Commonly known as:  PRILOSEC  Take 1 capsule (40 mg total) by mouth daily.     potassium chloride 10 MEQ  tablet  Commonly known as:  K-DUR  Take 1 tablet (10 mEq total) by mouth 3 (three) times a week. Patient takes on tues.,thurs.,sat.     traMADol 50 MG tablet  Commonly known as:  ULTRAM  Take 50 mg by mouth every 6 (six) hours as needed for pain.       Allergies  Allergen Reactions  . Tylenol (Acetaminophen) Other (See Comments)    cirrhosis  . Morphine And Related Itching       Follow-up Information   Follow up with VYAS,DHRUV B., MD In 1 week.   Contact information:   [address] [place] [address] [phone]       Follow up with [facility] S, MD In 1 week.   Contact information:   [address] [place] [address] [phone]        The results of significant diagnostics from this hospitalization (including imaging, microbiology, ancillary and laboratory) are listed below for reference.    Significant Diagnostic Studies: Ct Abdomen Pelvis W Contrast  10/27/2012   *RADIOLOGY REPORT*  Clinical Data: Acute abdominal pain, vomiting and jaundice.  CT ABDOMEN AND PELVIS WITH CONTRAST  Technique:  Multidetector CT imaging of the abdomen and pelvis was performed following the standard protocol during bolus administration of intravenous contrast.  Contrast: 100 mL of Omnipaque 300 IV contrast  Comparison: CT of the abdomen and pelvis performed 10/16/2012, MRI of the lumbar spine performed 03/07/2012, and abdominal ultrasound performed 08/10/2012  Findings: There is a persistent somewhat loculated small right pleural effusion, with associated pleural thickening.  Round airspace opacity at the right lower lobe again likely reflects round atelectasis.  Both of these findings are generally stable from 2012.  Scattered coronary artery calcifications are seen.  A small to moderate amount of free fluid is noted within the lower abdomen and pelvis, tracking predominately along the left paracolic gutter and about small bowel loops.  This is slightly decreased from the  prior study, and reflects the patient's peritoneal dialysis.  The degree of soft tissue edema within the small bowel mesentery is relatively stable, and may reflect reactive change secondary to intra-abdominal fluid.  A vague 2.6 cm partially hypodense heterogeneous lesion within the right hepatic lobe is nonspecific.  The liver is decreased in size, with mild nodularity, compatible with cirrhosis. The patient's TIPS is grossly unchanged in appearance; its patency is not well assessed on this study.  The spleen is unremarkable.  The pancreas  is somewhat atrophic; dense calcification at the pancreatic head likely reflects sequelae of chronic pancreatitis.  Scattered peripancreatic nodes are grossly unchanged.  The adrenal glands are within normal limits.  Chronic bilateral renal atrophy is noted.  Nonspecific perinephric stranding is noted bilaterally.  Scattered tiny renal cysts are again noted.  There is no evidence of hydronephrosis.  No renal or ureteral stones are seen.  There is suggestion of mild irregular wall thickening along the jejunum, which could reflect a mild infectious process, or could be reactive in nature.  The patient's peritoneal dialysis catheter is noted ending along the anterior left hemipelvis, above the bladder. The stomach is within normal limits.  No acute vascular abnormalities are seen.  Diffuse calcification is noted along the abdominal aorta and its branches, including at the origins of both renal arteries.  The appendix is normal in caliber and contains air.  Contrast progresses to the level of the ascending colon.  Previously noted soft tissue inflammation at the proximal sigmoid colon appears have resolved.  The colon is grossly unremarkable in appearance.  The bladder is mildly distended and grossly unremarkable.  The prostate remains normal in size.  No inguinal lymphadenopathy is seen.  No acute osseous abnormalities are identified.  There is partial chronic osseous fusion at  L1-L2.  IMPRESSION:  1.  Previously noted changes suggestive of focal colitis have resolved. 2.  Apparent irregular wall thickening along much of the jejunum, possibly reflecting an acute infectious process. 3.  Small to moderate volume ascites likely reflects peritoneal dialysis, slightly decreased in volume from the prior study. Diffuse mesenteric edema may be reactive secondary to surrounding fluid. 4.  Small right-sided pleural effusion, with loculation and pleural thickening.  Rounded right lower lobe airspace disease appears to reflect rounded atelectasis.  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 6.  Patency of the TIPS is not well assessed on this study; cirrhotic change again noted.  2.6 cm heterogeneous lesion within the right hepatic lobe is somewhat better characterized than on prior studies.  Though this could simply be postoperative, given adjacent underlying calcification, dynamic liver protocol MRI or CT could be considered for further evaluation.  MRI should only be performed if the patient can hold his breath for the study. 7.  Chronic bilateral renal atrophy noted, with scattered tiny bilateral renal cysts. 8.  Diffuse calcification along the abdominal aorta and its branches, including at the origins of both renal arteries.   Original Report Authenticated By: Tonia Ghent, M.D.   Dg Chest Portable 1 View  11/21/2012   *RADIOLOGY REPORT*  Clinical Data: Weakness and shortness of breath.  PORTABLE CHEST - 1 VIEW  Comparison: Single view of the chest 10/29/2012 and 10/20/2012.  Findings: Right lower lobe airspace disease seen on the most recent study has improved.  There is patchy airspace disease in the left lung base.  No pneumothorax or pleural fluid is identified.  Heart size is upper normal.  IMPRESSION: Improved right lower lobe airspace disease with patchy airspace opacity in the left lung base which could be due to atelectasis  or pneumonia.   Original Report Authenticated By: Holley Dexter, M.D.   Dg Chest Port 1 View  10/29/2012   *RADIOLOGY REPORT*  Clinical Data: Cough, weakness, shortness of breath.  PORTABLE CHEST - 1 VIEW  Comparison: 10/26/2012  Findings: New consolidation in the right lower lobe compatible with pneumonia.  Small right pleural effusions.  Mild cardiomegaly.  No focal opacity on the left.  IMPRESSION: Right lower lobe pneumonia.  Small right effusion.   Original Report Authenticated By: Charlett Nose, M.D.   Dg Chest Portable 1 View  10/26/2012   *RADIOLOGY REPORT*  Clinical Data: Abdominal pain  PORTABLE CHEST - 1 VIEW  Comparison: 4/11/ 14  Findings: Cardiomegaly again noted.  No acute infiltrate or pulmonary edema.  Stable right basilar scarring.  Mild hyperinflation.  IMPRESSION: .  Cardiomegaly.  Stable right basilar scarring.  No acute infiltrate or pulmonary edema.   Original Report Authenticated By: Natasha Mead, M.D.    Microbiology: Recent Results (from the past 240 hour(s))  MRSA PCR SCREENING     Status: None   Collection Time    11/21/12  6:23 PM      Result Value Range Status   MRSA by PCR NEGATIVE  NEGATIVE Final   Comment:            The GeneXpert MRSA Assay (FDA     approved for NASAL specimens     only), is one component of a     comprehensive MRSA colonization     surveillance program. It is not     intended to diagnose MRSA     infection nor to guide or     monitor treatment for     MRSA infections.     Labs: Basic Metabolic Panel:  Recent Labs Lab 11/21/12 1025 11/22/12 1132 11/23/12 0829 11/24/12 0545 11/25/12 0830  NA 133* 133* 133* 135 137  K 2.9* 3.5 3.4* 3.6 3.4*  CL 95* 97 98 101 102  CO2 26 25 23 23 24   GLUCOSE 182* 175* 127* 125* 133*  BUN 22 39* 50* 28* 38*  CREATININE 4.63* 6.15* 7.08* 4.67* 6.07*  CALCIUM 7.6* 8.0* 7.6* 7.7* 8.2*  PHOS  --   --  5.2*  --  4.9*   Liver Function Tests:  Recent Labs Lab 11/22/12 1132 11/23/12 0829  11/24/12 0545 11/25/12 0830  AST 22  --  26  --   ALT 14  --  11  --   ALKPHOS 103  --  99  --   BILITOT 1.8*  --  0.7  --   PROT 5.6*  --  5.0*  --   ALBUMIN 1.2* 1.2* 1.2* 1.3*   No results found for this basename: LIPASE, AMYLASE,  in the last 168 hours  Recent Labs Lab 11/21/12 1445  AMMONIA 18   CBC:  Recent Labs Lab 11/21/12 1025  11/22/12 1919 11/23/12 0227 11/23/12 0829 11/24/12 0545 11/25/12 0830  WBC 6.4  < > 6.3 7.2 7.2 6.5 6.0  NEUTROABS 4.8  --   --   --   --   --   --   HGB 6.0*  < > 7.3* 7.1* 7.5* 7.7* 7.9*  HCT 18.0*  < > 21.7* 20.7* 21.7* 23.0* 23.8*  MCV 95.2  < > 90.0 90.0 88.6 89.1 89.8  PLT 127*  < > 132* 137* 121* 107* 120*  < > = values in this interval not displayed. Cardiac Enzymes:  Recent Labs Lab 11/21/12 1145  TROPONINI <0.30   BNP: BNP (last 3 results)  Recent Labs  11/21/12 1145  PROBNP 1167.0*   CBG:  Recent Labs Lab 11/24/12 0756 11/24/12 1224 11/24/12 1636 11/24/12 2000 11/25/12 0752  GLUCAP 138* 152* 188* 198* 127*   Signed:  Mattelyn Imhoff L  [facility] 11/25/2012, 1:41 PM

## 2012-11-25 NOTE — Progress Notes (Signed)
Pt out of room in HD this AM.  Unable to complete eval.  Aida Raider, PT  Office # [phone] Pager [phone]

## 2012-11-25 NOTE — Procedures (Signed)
Patient was seen on dialysis and the procedure was supervised.  BFR 400  Via AVF BP is  102/55.   Patient appears to be tolerating treatment well  Timothy Chan A 11/25/2012

## 2012-11-25 NOTE — Progress Notes (Signed)
Received order to discharge patient to home. Reviewed all discharge instructions with patient including diet, appointments, medications to take, activity, not to drive until cleared by doctor, when to call 911, when to call doctor, importance of proper hand washing and all discharge instructions. Copy of discharge instructions given to patient including how to sign up for MyChart; prescriptions given to patient to fill. Patient says he has no other questions or concerns.

## 2012-11-26 LAB — TYPE AND SCREEN
ABO/RH(D): B NEG
Antibody Screen: NEGATIVE
Unit division: 0
Unit division: 0
Unit division: 0
Unit division: 0

## 2012-12-05 ENCOUNTER — Ambulatory Visit (INDEPENDENT_AMBULATORY_CARE_PROVIDER_SITE_OTHER): Payer: Medicare HMO | Admitting: Internal Medicine

## 2012-12-07 ENCOUNTER — Telehealth (INDEPENDENT_AMBULATORY_CARE_PROVIDER_SITE_OTHER): Payer: Self-pay | Admitting: *Deleted

## 2012-12-07 NOTE — Telephone Encounter (Addendum)
Patient called stating he has gained weight, containing fluid & blood lose. Scheduled an apt with you while Dr. Karilyn Cota was in the office for 12/12/12. Advised patient to go the to ED if he gets worse. [name] asked to let the providers know how sick he is. His return phone number is [phone].

## 2012-12-07 NOTE — Telephone Encounter (Signed)
Per Dorene Ar, NP, have Timothy Chan go to the ED. Per [name] he is going to wait and see if he can get any of the blood out of him. He is taking Lactulose. Transferred the call to Terri.

## 2012-12-07 NOTE — Telephone Encounter (Signed)
There is no answer at home

## 2012-12-07 NOTE — Telephone Encounter (Signed)
I advised him if he was having black stool and abdominal distention that he needed to go to the ED.

## 2012-12-12 ENCOUNTER — Ambulatory Visit (INDEPENDENT_AMBULATORY_CARE_PROVIDER_SITE_OTHER): Payer: Medicare Other | Admitting: Internal Medicine

## 2012-12-19 ENCOUNTER — Encounter (INDEPENDENT_AMBULATORY_CARE_PROVIDER_SITE_OTHER): Payer: Self-pay | Admitting: *Deleted

## 2012-12-19 ENCOUNTER — Other Ambulatory Visit (INDEPENDENT_AMBULATORY_CARE_PROVIDER_SITE_OTHER): Payer: Self-pay | Admitting: *Deleted

## 2012-12-19 ENCOUNTER — Encounter (INDEPENDENT_AMBULATORY_CARE_PROVIDER_SITE_OTHER): Payer: Self-pay | Admitting: Internal Medicine

## 2012-12-19 ENCOUNTER — Ambulatory Visit (INDEPENDENT_AMBULATORY_CARE_PROVIDER_SITE_OTHER): Payer: Medicare Other | Admitting: Internal Medicine

## 2012-12-19 VITALS — BP 82/80 | HR 60 | Temp 98.0°F | Ht 75.0 in | Wt 230.0 lb

## 2012-12-19 DIAGNOSIS — K921 Melena: Secondary | ICD-10-CM

## 2012-12-19 DIAGNOSIS — R188 Other ascites: Secondary | ICD-10-CM

## 2012-12-19 NOTE — Progress Notes (Signed)
Subjective:     Patient ID: Timothy Chan, male   DOB: 1951/05/25, 62 y.o.   MRN: [id number]  HPI Presents today with c/o of rectal bleeding.  Hx of  alcoholic cirrhosis complicated by ascites status post TIPS as well as hepatic encephalopathy.  . He has been evaluated at [facility] and felt not to be candidate for hepatorenal transplant     He tells me his stools are black.  Stools are loose. He had a black stool today. He has had rectal bleeding x 2 months.  EGD in May and April. Hx of GAVE. He has a BM every 2 day.  Appetite is not good.  He was suppose to go to dialysis yesterday but was tired and did not go. Dialysis on M-W-F.  He thinks his abdomen has some distention. Left knee is tender. Hx of peritonitis in April. Presently taking Cipro 500mg  once a day      11/22/2012 EGD, Dr. Loreta Ave: : Iron deficiency anemia, Hematochezia, coffee ground emesis.  IMPRESSION: 1) Normal appearing esophagus.  2) Grade I esophagitis at the GEJ.  3) Portal hypertensive gastropathy.  4) Gastric antral vascular ectasia.  5) Normal proximal small bowel.  10/24/2012  Dr. Karilyn Cota: Procedure: EGD with APC of GAVE.  Indications: Patient is 62 year old Caucasian male with multiple medical problems including advanced liver disease complicated by hepatic encephalopathy who was developed melena and drop in his H&H. 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 He has not required PRBCs until this admission.   CBC    Component Value Date/Time   WBC 6.0 11/25/2012 0830   RBC 2.65* 11/25/2012 0830   HGB 7.9* 11/25/2012 0830   HCT 23.8* 11/25/2012 0830   PLT 120* 11/25/2012 0830   MCV 89.8 11/25/2012 0830   MCH 29.8 11/25/2012 0830   MCHC 33.2 11/25/2012 0830   RDW 18.4* 11/25/2012 0830   LYMPHSABS 0.7 11/21/2012 1025   MONOABS 0.8 11/21/2012 1025   EOSABS 0.1 11/21/2012 1025   BASOSABS 0.0 11/21/2012 1025   CMP     Component Value Date/Time   NA 137 11/25/2012 0830   K 3.4* 11/25/2012 0830   CL 102  11/25/2012 0830   CO2 24 11/25/2012 0830   GLUCOSE 133* 11/25/2012 0830   BUN 38* 11/25/2012 0830   CREATININE 6.07* 11/25/2012 0830   CALCIUM 8.2* 11/25/2012 0830   PROT 5.0* 11/24/2012 0545   ALBUMIN 1.3* 11/25/2012 0830   AST 26 11/24/2012 0545   ALT 11 11/24/2012 0545   ALKPHOS 99 11/24/2012 0545   BILITOT 0.7 11/24/2012 0545   GFRNONAA 9* 11/25/2012 0830   GFRAA 10* 11/25/2012 0830        Review of Systems see hpi Current Outpatient Prescriptions  Medication Sig Dispense Refill  . b complex-vitamin c-folic acid (NEPHRO-VITE) 0.8 MG TABS Take 1 tablet by mouth at bedtime.  30 tablet  0  . ciprofloxacin (CIPRO) 500 MG tablet Take 1 tablet (500 mg total) by mouth daily.  30 tablet  0  . glipiZIDE (GLUCOTROL) 5 MG tablet Take 1 tablet (5 mg total) by mouth 2 (two) times daily.  60 tablet  0  . lactulose (CHRONULAC) 10 GM/15ML solution Take 30 mLs (20 g total) by mouth 2 (two) times daily.  240 mL  0  . lanthanum (FOSRENOL) 1000 MG chewable tablet Chew 1 tablet (1,000 mg total) by mouth 4 (four) times daily -  with meals and at bedtime.  30 tablet  0  . omeprazole (PRILOSEC) 40 MG capsule Take 1 capsule (40 mg total) by mouth daily.  30 capsule  0  . traMADol (ULTRAM) 50 MG tablet Take 50 mg by mouth every 6 (six) hours as needed for pain.       No current facility-administered medications for this visit.   Past Medical History  Diagnosis Date  . Rectal bleeding   . Dialysis care   . GI bleed   . Hypertension   . Esophageal varices   . Alcoholic liver disease   . Hepatic encephalopathy   . Ascites   . Diabetes mellitus   . Detached retina   . Renal disorder   . Pneumonia    Past Surgical History  Procedure Laterality Date  . Esophagogastroduodenoscopy  12/31/2010    EGD APC THERAPY  . Esophagogastroduodenoscopy  05/31/2012E    GD APC ABLATION  . Small bowel givens  12/01/2010  . Colonoscopy  09/07/2010  . Esophagogastroduodenoscopy  09/05/2010    OUTLAW  . Lung surgery   6/11    [place]  . Esophagogastroduodenoscopy  03/26/2011    Procedure: ESOPHAGOGASTRODUODENOSCOPY (EGD);  Surgeon: Malissa Hippo, MD;  Location[facility];  Service: Endoscopy;  Laterality: N/A;  8:30 am  . Hot hemostasis  03/26/2011    Procedure: HOT HEMOSTASIS (ARGON PLASMA COAGULATION/BICAP);  Surgeon: Malissa Hippo, MD;  Location: [facility];  Service: Endoscopy;  Laterality: N/A;  . Stent in liver    . Esophagogastroduodenoscopy N/A 10/24/2012    Procedure: ESOPHAGOGASTRODUODENOSCOPY (EGD);  Surgeon: Malissa Hippo, MD;  Location: [facility];  Service: Endoscopy;  Laterality: N/A;  . Esophagogastroduodenoscopy N/A 11/22/2012    Procedure: ESOPHAGOGASTRODUODENOSCOPY (EGD);  Surgeon: Charna Elizabeth, MD;  [facility]COPY;  Service: Endoscopy;  Laterality: N/A;   Allergies  Allergen Reactions  . Tylenol (Acetaminophen) Other (See Comments)    cirrhosis  . Morphine And Related Itching        Objective:   Physical Exam   Filed Vitals:   12/19/12 1453  BP: 82/80  Pulse: 60  Temp: 98 F (36.7 C)  Height: 6\' 3"  (1.905 m)  Weight: 230 lb (104.327 kg)  Alert and oriented. Skin warm and dry. Oral mucosa is moist.   . Sclera anicteric, conjunctivae is pink. Thyroid not enlarged. No cervical lymphadenopathy. Lungs clear. Heart regular rate and rhythm.  Abdomen is soft. Bowel sounds are positive. No hepatomegaly. No abdominal masses felt.Abdomen is slight tense. Abdominal varices noted.  No tenderness.  No edema to lower extremities.   Stool black and guaiac negative.       Assessment:    Melena. Hx of GAVE.   I discussed this case with Dr. Karilyn Cota. Ascites.    Plan:    CBC, CMET, PT/INR, Paracentesis.  EGD with APC therapy Cell ct, Gram stain, Aerobic and anerobic.

## 2012-12-19 NOTE — Patient Instructions (Addendum)
Paracentesis. CBC,CMET, PT/INR, EGD with APC therapy

## 2012-12-20 ENCOUNTER — Encounter (HOSPITAL_COMMUNITY): Payer: Self-pay | Admitting: Pharmacy Technician

## 2012-12-20 LAB — PROTIME-INR
INR: 1.15 (ref <1.50)
Prothrombin Time: 14.6 seconds (ref 11.6–15.2)

## 2012-12-20 LAB — COMPREHENSIVE METABOLIC PANEL
Albumin: <2 g/dL — ABNORMAL LOW (ref 3.5–5.2)
BUN: 43 mg/dL — ABNORMAL HIGH (ref 6–23)
CO2: 26 mEq/L (ref 19–32)
Calcium: 8.4 mg/dL (ref 8.4–10.5)
Chloride: 104 mEq/L (ref 96–112)
Glucose, Bld: 118 mg/dL — ABNORMAL HIGH (ref 70–99)
Potassium: 4.5 mEq/L (ref 3.5–5.3)
Sodium: 139 mEq/L (ref 135–145)
Total Protein: 5.8 g/dL — ABNORMAL LOW (ref 6.0–8.3)

## 2012-12-20 LAB — CBC WITH DIFFERENTIAL/PLATELET
Basophils Relative: 2 % — ABNORMAL HIGH (ref 0–1)
HCT: 26.7 % — ABNORMAL LOW (ref 39.0–52.0)
Hemoglobin: 8.5 g/dL — ABNORMAL LOW (ref 13.0–17.0)
Lymphs Abs: 2 10*3/uL (ref 0.7–4.0)
MCHC: 31.8 g/dL (ref 30.0–36.0)
Monocytes Absolute: 0.9 10*3/uL (ref 0.1–1.0)
Monocytes Relative: 13 % — ABNORMAL HIGH (ref 3–12)
Neutro Abs: 3.3 10*3/uL (ref 1.7–7.7)
RBC: 2.93 MIL/uL — ABNORMAL LOW (ref 4.22–5.81)

## 2012-12-21 ENCOUNTER — Ambulatory Visit (HOSPITAL_COMMUNITY)
Admission: RE | Admit: 2012-12-21 | Discharge: 2012-12-21 | Disposition: A | Discharge status: Home / Self Care | Payer: Medicare Other | Source: Ambulatory Visit | Attending: Internal Medicine | Admitting: Internal Medicine

## 2012-12-21 ENCOUNTER — Encounter (HOSPITAL_COMMUNITY): Payer: Self-pay | Admitting: *Deleted

## 2012-12-21 ENCOUNTER — Encounter (HOSPITAL_COMMUNITY)
Admission: RE | Disposition: A | Discharge status: Home / Self Care | Payer: Self-pay | Source: Ambulatory Visit | Attending: Internal Medicine

## 2012-12-21 DIAGNOSIS — K31811 Angiodysplasia of stomach and duodenum with bleeding: Secondary | ICD-10-CM

## 2012-12-21 DIAGNOSIS — E119 Type 2 diabetes mellitus without complications: Secondary | ICD-10-CM | POA: Insufficient documentation

## 2012-12-21 DIAGNOSIS — K766 Portal hypertension: Secondary | ICD-10-CM

## 2012-12-21 DIAGNOSIS — I8510 Secondary esophageal varices without bleeding: Secondary | ICD-10-CM | POA: Insufficient documentation

## 2012-12-21 DIAGNOSIS — K921 Melena: Secondary | ICD-10-CM

## 2012-12-21 DIAGNOSIS — D649 Anemia, unspecified: Secondary | ICD-10-CM | POA: Insufficient documentation

## 2012-12-21 DIAGNOSIS — K7030 Alcoholic cirrhosis of liver without ascites: Secondary | ICD-10-CM

## 2012-12-21 DIAGNOSIS — D509 Iron deficiency anemia, unspecified: Secondary | ICD-10-CM

## 2012-12-21 DIAGNOSIS — I8500 Esophageal varices without bleeding: Secondary | ICD-10-CM

## 2012-12-21 DIAGNOSIS — I10 Essential (primary) hypertension: Secondary | ICD-10-CM | POA: Insufficient documentation

## 2012-12-21 DIAGNOSIS — R188 Other ascites: Secondary | ICD-10-CM

## 2012-12-21 DIAGNOSIS — K7460 Unspecified cirrhosis of liver: Secondary | ICD-10-CM | POA: Insufficient documentation

## 2012-12-21 HISTORY — PX: HOT HEMOSTASIS: SHX5433

## 2012-12-21 HISTORY — PX: ESOPHAGOGASTRODUODENOSCOPY: SHX5428

## 2012-12-21 LAB — BODY FLUID CELL COUNT WITH DIFFERENTIAL
Eos, Fluid: 0 %
Monocyte-Macrophage-Serous Fluid: 85 % (ref 50–90)

## 2012-12-21 SURGERY — EGD (ESOPHAGOGASTRODUODENOSCOPY)
Anesthesia: Moderate Sedation

## 2012-12-21 MED ORDER — BUTAMBEN-TETRACAINE-BENZOCAINE 2-2-14 % EX AERO
INHALATION_SPRAY | CUTANEOUS | Status: DC | PRN
  Administered 2012-12-21: 2 via TOPICAL

## 2012-12-21 MED ORDER — ALBUMIN HUMAN 25 % IV SOLN
50.0000 g | Freq: Once | INTRAVENOUS | Status: AC
  Administered 2012-12-21: 50 g via INTRAVENOUS
  Filled 2012-12-21: qty 200

## 2012-12-21 MED ORDER — MIDAZOLAM HCL 5 MG/5ML IJ SOLN
INTRAMUSCULAR | Status: AC
  Filled 2012-12-21: qty 10

## 2012-12-21 MED ORDER — SODIUM CHLORIDE 0.9 % IV SOLN: INTRAVENOUS | Status: DC

## 2012-12-21 MED ORDER — STERILE WATER FOR IRRIGATION IR SOLN
Status: DC | PRN
  Administered 2012-12-21: 14:00:00

## 2012-12-21 MED ORDER — MIDAZOLAM HCL 5 MG/5ML IJ SOLN
INTRAMUSCULAR | Status: DC | PRN
  Administered 2012-12-21: 1 mg via INTRAVENOUS
  Administered 2012-12-21 (×4): 2 mg via INTRAVENOUS

## 2012-12-21 MED ORDER — MEPERIDINE HCL 25 MG/ML IJ SOLN
INTRAMUSCULAR | Status: DC | PRN
  Administered 2012-12-21: 25 mg via INTRAVENOUS

## 2012-12-21 MED ORDER — MEPERIDINE HCL 50 MG/ML IJ SOLN
INTRAMUSCULAR | Status: AC
  Filled 2012-12-21: qty 1

## 2012-12-21 NOTE — Progress Notes (Signed)
Dr. Tyron Russell entered room at 1148, procedure explained to patient; consent signed; timeout performed, Dondra Prader, RTR, Terrilyn Saver, RN, Dr. Tyron Russell present; Dr. Tyron Russell prepped area with chloraprep; procedure start at 1159; Dr. Tyron Russell injected 8 ml of 1% lidocaine, removed three 60 ml of yellow fluid to send to lab, additional fluid draining; report given to Timothy Chan, Charity fundraiser.

## 2012-12-21 NOTE — Op Note (Signed)
EGD PROCEDURE REPORT  PATIENT:  Timothy Chan  MR#:  [id number] Birthdate:  Apr 11, 1951, 62 y.o., male Endoscopist:  Dr. Malissa Hippo, MD Referred By:  Dr. Ignatius Specking, MD Procedure Date: 12/21/2012  Procedure:   EGD with argon plasma coagulation of gastric antral vascular ectasia.  Indications:  Patient is 62 year old Caucasian male with multiple medical problems including advanced cirrhosis presents with recurrent melena and anemia. He is undergoing EGD with APC of GAVE. This therapy has decreased the need for transfusion in the past.            Informed Consent:  The risks, benefits, alternatives & imponderables which include, but are not limited to, bleeding, infection, perforation, drug reaction and potential missed lesion have been reviewed.  The potential for biopsy, lesion removal, esophageal dilation, etc. have also been discussed.  Questions have been answered.  All parties agreeable.  Please see history & physical in medical record for more information.  Medications:  Demerol 25  mg IV Versed 9 mg IV Cetacaine spray topically for oropharyngeal anesthesia  Description of procedure:  The endoscope was introduced through the mouth and advanced to the second portion of the duodenum without difficulty or limitations. The mucosal surfaces were surveyed very carefully during advancement of the scope and upon withdrawal.  Findings:  Esophagus:  Mucosa of the proximal and middle segment was normal. 2 short columns of grade 1 esophageal varices noted in the distal segment. GEJ:  43 cm Stomach:  Stomach was empty and distended very well with insufflation. Folds in the proximal stomach were normal. Examination mucosa gastric body revealed snake skinning or mosaic pattern with multiple red spots. Multiple telangiectasia noted at gastric antrum some covered with fresh blood. Fundus and cardia examined by retroflex in the scope and no fundal varices were present. Duodenum:  Normal bulbar and  post bulbar mucosa.  Therapeutic/Diagnostic Maneuvers Performed:   Antral vascular telangiectasia that were covered with blood were ablated with argon plasma coagulator and multiple other non-bleeding lesions were also ablated. All of the lesions were not treated today.  Complications:  None  Impression: 2 short columns of grade 1 esophageal varices. Portal gastropathy. Multiple gastric antral vascular ectasia and some covered with fresh blood. Multiple lesions were treated with argon plasma coagulator.  Recommendations:  Standard instructions given. CBC in 1 week. Repeat EGD with therapeutic intervention in 4 weeks.  Cira Deyoe U  12/21/2012  2:48 PM  CC: Dr. Ignatius Specking., MD & Dr. Bonnetta Barry ref. provider found

## 2012-12-21 NOTE — Progress Notes (Signed)
A total of 4380 ml of yellow fluid removed from peritoneal cavity including 180 ml sent to lab for testing. Pt states that he is feeling well. Discharge instructions reviewed. Pt transferred to short stay for endoscopy with Dr. Karilyn Cota.

## 2012-12-21 NOTE — Procedures (Signed)
PreOperative Dx: Cirrhosis, ascites Postoperative Dx: Cirrhosis, ascites Procedure:   US guided paracentesis Radiologist:  Tyron Russell Anesthesia:  8 ml of 1% lidocaine Specimen:  4380 ml of clear yellow fluid EBL:   < 1 ml Complications: None

## 2012-12-21 NOTE — H&P (Signed)
Timothy Chan is an 62 y.o. male.   Chief Complaint: Patient is here for EGD and possible APC therapy. HPI: Patient is 62 year old Caucasian male with multiple medical problems including end-stage liver disease who presents with recurrent melena. Her 4 weeks ago he was at [facility]. with hemoglobin of 6 g and received 3 units of PRBCs. He did undergo EGD but without therapeutic intervention. He has a history of gave and APC therapy in the past seemed to have helped. His hemoglobin 2 days ago was 8.5 and hematocrit 26.7 and platelet count was 169K.    Past Medical History  Diagnosis Date  . Rectal bleeding   . Dialysis care   . GI bleed   . Hypertension   . Esophageal varices   . Alcoholic liver disease   . Hepatic encephalopathy   . Ascites   . Diabetes mellitus   . Detached retina   . Renal disorder   . Pneumonia     Past Surgical History  Procedure Laterality Date  . Esophagogastroduodenoscopy  12/31/2010    EGD APC THERAPY  . Esophagogastroduodenoscopy  05/31/2012E    GD APC ABLATION  . Small bowel givens  12/01/2010  . Colonoscopy  09/07/2010  . Esophagogastroduodenoscopy  09/05/2010    OUTLAW  . Lung surgery  6/11    [place]  . Esophagogastroduodenoscopy  03/26/2011    Procedure: ESOPHAGOGASTRODUODENOSCOPY (EGD);  Surgeon: Malissa Hippo, MD;  [facility];  Service: Endoscopy;  Laterality: N/A;  8:30 am  . Hot hemostasis  03/26/2011    Procedure: HOT HEMOSTASIS (ARGON PLASMA COAGULATION/BICAP);  Surgeon: Malissa Hippo, MD;  Location: [facility];  Service: Endoscopy;  Laterality: N/A;  . Stent in liver    . Esophagogastroduodenoscopy N/A 10/24/2012    Procedure: ESOPHAGOGASTRODUODENOSCOPY (EGD);  Surgeon: Malissa Hippo, MD;  [facility];  Service: Endoscopy;  Laterality: N/A;  . Esophagogastroduodenoscopy N/A 11/22/2012    Procedure: ESOPHAGOGASTRODUODENOSCOPY (EGD);  Surgeon: Charna Elizabeth, MD;  Location: [facility] ENDOSCOPY;  Service: Endoscopy;   Laterality: N/A;  . Paracentesis      History reviewed. No pertinent family history. Social History:  reports that he quit smoking about 16 months ago. His smoking use included Cigarettes. He has a 30 pack-year smoking history. He quit smokeless tobacco use about 7 months ago. He reports that he does not drink alcohol or use illicit drugs.  Allergies:  Allergies  Allergen Reactions  . Tylenol (Acetaminophen) Other (See Comments)    cirrhosis  . Morphine And Related Itching    Medications Prior to Admission  Medication Sig Dispense Refill  . b complex-vitamin c-folic acid (NEPHRO-VITE) 0.8 MG TABS Take 1 tablet by mouth at bedtime.  30 tablet  0  . ciprofloxacin (CIPRO) 500 MG tablet Take 1 tablet (500 mg total) by mouth daily.  30 tablet  0  . citalopram (CELEXA) 10 MG tablet Take 10 mg by mouth at bedtime.      [name] esomeprazole (NEXIUM) 40 MG capsule Take 40 mg by mouth daily.      . furosemide (LASIX) 80 MG tablet Take 80 mg by mouth 2 (two) times daily.      [name] glipiZIDE (GLUCOTROL) 5 MG tablet Take 1 tablet (5 mg total) by mouth 2 (two) times daily.  60 tablet  0  . lactulose (CHRONULAC) 10 GM/15ML solution Take 30 mLs (20 g total) by mouth 2 (two) times daily.  240 mL  0  . lanthanum (FOSRENOL) 1000  MG chewable tablet Chew 1 tablet (1,000 mg total) by mouth 4 (four) times daily -  with meals and at bedtime.  30 tablet  0  . ondansetron (ZOFRAN) 4 MG tablet Take 4 mg by mouth 2 (two) times daily as needed for nausea.      . potassium chloride (K-DUR) 10 MEQ tablet Take 10 mEq by mouth 3 (three) times daily. On Mon.,Wed.,Fri.,after dialysis      . propranolol ER (INDERAL LA) 80 MG 24 hr capsule Take 80 mg by mouth daily.      . traMADol (ULTRAM) 50 MG tablet Take 50 mg by mouth 3 (three) times daily as needed for pain.         Results for orders placed during the hospital encounter of 12/21/12 (from the past 48 hour(s))  BODY FLUID CELL COUNT WITH DIFFERENTIAL     Status: None    Collection Time    12/21/12 12:04 PM      Result Value Range   Fluid Type-FCT FLUID     Comment: ASCITIC     COLLECTED BY DOCTOR     BOLES     CORRECTED ON 06/12 AT 1229: PREVIOUSLY REPORTED AS Body Fluid   Color, Fluid YELLOW  YELLOW   Appearance, Fluid CLEAR  CLEAR   WBC, Fluid 36  0 - 1000 cu mm   Neutrophil Count, Fluid PENDING  0 - 25 %   Lymphs, Fluid PENDING     Monocyte-Macrophage-Serous Fluid PENDING  50 - 90 %   Eos, Fluid PENDING     Other Cells, Fluid PENDING    GRAM STAIN     Status: None   Collection Time    12/21/12 12:04 PM      Result Value Range   Specimen Description FLUID ASCITIC COLLECTED BY DOCTOR BOLES     Special Requests DIALYSIS     Gram Stain       Value: CYTOSPIN WBC PRESENT, PREDOMINANTLY MONONUCLEAR NO ORGANISMS SEEN     Performed at [facility]   Report Status PENDING     No results found.  ROS  Blood pressure 123/62, pulse 73, temperature 97.4 F (36.3 C), temperature source Oral, resp. rate 14, SpO2 93.00%. Physical Exam  Constitutional:  Well-developed thin Caucasian male who is in no acute distress.  He is alert and does not have asterixis  Eyes: Conjunctivae are normal. No scleral icterus.  Neck: No thyromegaly present.  Cardiovascular: Normal rate, regular rhythm and normal heart sounds.   No murmur heard. Respiratory: Effort normal and breath sounds normal.  GI:  Performed with bulging flanks. Soft with mild peri-umblical tenderness.  Musculoskeletal: He exhibits no edema.  AV fistula at left forearm.  Lymphadenopathy:    He has no cervical adenopathy.  Neurological: He is alert.  Skin: Skin is warm and dry.     Assessment/Plan Recurrent melena and anemia. 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 EGD with therapeutic intervention.  Evee Liska U 12/21/2012, 2:05 PM

## 2012-12-22 ENCOUNTER — Encounter (HOSPITAL_COMMUNITY): Payer: Self-pay | Admitting: Internal Medicine

## 2012-12-22 LAB — GRAM STAIN

## 2012-12-22 LAB — PATHOLOGIST SMEAR REVIEW

## 2012-12-25 ENCOUNTER — Encounter (INDEPENDENT_AMBULATORY_CARE_PROVIDER_SITE_OTHER): Payer: Self-pay | Admitting: *Deleted

## 2012-12-25 ENCOUNTER — Telehealth (INDEPENDENT_AMBULATORY_CARE_PROVIDER_SITE_OTHER): Payer: Self-pay | Admitting: *Deleted

## 2012-12-25 ENCOUNTER — Other Ambulatory Visit (INDEPENDENT_AMBULATORY_CARE_PROVIDER_SITE_OTHER): Payer: Self-pay | Admitting: *Deleted

## 2012-12-25 DIAGNOSIS — K31819 Angiodysplasia of stomach and duodenum without bleeding: Secondary | ICD-10-CM

## 2012-12-25 DIAGNOSIS — K921 Melena: Secondary | ICD-10-CM

## 2012-12-25 DIAGNOSIS — K7030 Alcoholic cirrhosis of liver without ascites: Secondary | ICD-10-CM

## 2012-12-25 DIAGNOSIS — D649 Anemia, unspecified: Secondary | ICD-10-CM

## 2012-12-25 NOTE — Progress Notes (Signed)
This encounter was created in error - please disregard.

## 2012-12-25 NOTE — Telephone Encounter (Signed)
FYI - Per Deanna from Dr. Pollyann Samples Hepatology from [facility], Darryll's next apt is 03/22/13. There return phone number is [phone].

## 2012-12-26 LAB — CULTURE, BODY FLUID W GRAM STAIN -BOTTLE: Culture: NO GROWTH

## 2012-12-26 NOTE — Telephone Encounter (Signed)
Dr.Rehman was made aware and he plans to call Dr.Berg.

## 2013-01-05 ENCOUNTER — Emergency Department (HOSPITAL_COMMUNITY)
Admission: EM | Admit: 2013-01-05 | Discharge: 2013-01-06 | Disposition: A | Discharge status: Home / Self Care | Payer: Medicare Other | Attending: Emergency Medicine | Admitting: Emergency Medicine

## 2013-01-05 ENCOUNTER — Encounter (HOSPITAL_COMMUNITY): Payer: Self-pay

## 2013-01-05 ENCOUNTER — Emergency Department (HOSPITAL_COMMUNITY): Payer: Medicare Other

## 2013-01-05 DIAGNOSIS — Z87891 Personal history of nicotine dependence: Secondary | ICD-10-CM | POA: Insufficient documentation

## 2013-01-05 DIAGNOSIS — Z8669 Personal history of other diseases of the nervous system and sense organs: Secondary | ICD-10-CM | POA: Insufficient documentation

## 2013-01-05 DIAGNOSIS — Z8719 Personal history of other diseases of the digestive system: Secondary | ICD-10-CM | POA: Insufficient documentation

## 2013-01-05 DIAGNOSIS — Z8679 Personal history of other diseases of the circulatory system: Secondary | ICD-10-CM | POA: Insufficient documentation

## 2013-01-05 DIAGNOSIS — N19 Unspecified kidney failure: Secondary | ICD-10-CM | POA: Insufficient documentation

## 2013-01-05 DIAGNOSIS — Z87448 Personal history of other diseases of urinary system: Secondary | ICD-10-CM | POA: Insufficient documentation

## 2013-01-05 DIAGNOSIS — E876 Hypokalemia: Secondary | ICD-10-CM | POA: Insufficient documentation

## 2013-01-05 DIAGNOSIS — I10 Essential (primary) hypertension: Secondary | ICD-10-CM | POA: Insufficient documentation

## 2013-01-05 DIAGNOSIS — Z8701 Personal history of pneumonia (recurrent): Secondary | ICD-10-CM | POA: Insufficient documentation

## 2013-01-05 DIAGNOSIS — Z79899 Other long term (current) drug therapy: Secondary | ICD-10-CM | POA: Insufficient documentation

## 2013-01-05 DIAGNOSIS — E119 Type 2 diabetes mellitus without complications: Secondary | ICD-10-CM | POA: Insufficient documentation

## 2013-01-05 LAB — PROTIME-INR: INR: 1.25 (ref 0.00–1.49)

## 2013-01-05 LAB — COMPREHENSIVE METABOLIC PANEL
AST: 31 U/L (ref 0–37)
Albumin: 1.6 g/dL — ABNORMAL LOW (ref 3.5–5.2)
Alkaline Phosphatase: 120 U/L — ABNORMAL HIGH (ref 39–117)
BUN: 12 mg/dL (ref 6–23)
CO2: 28 mEq/L (ref 19–32)
Chloride: 101 mEq/L (ref 96–112)
Creatinine, Ser: 4.92 mg/dL — ABNORMAL HIGH (ref 0.50–1.35)
GFR calc non Af Amer: 12 mL/min — ABNORMAL LOW (ref >90)
Potassium: 3.2 mEq/L — ABNORMAL LOW (ref 3.5–5.1)
Total Bilirubin: 0.8 mg/dL (ref 0.3–1.2)

## 2013-01-05 LAB — CBC WITH DIFFERENTIAL/PLATELET
Basophils Absolute: 0.1 10*3/uL (ref 0.0–0.1)
Basophils Relative: 1 % (ref 0–1)
Eosinophils Absolute: 0.4 10*3/uL (ref 0.0–0.7)
Eosinophils Relative: 9 % — ABNORMAL HIGH (ref 0–5)
Lymphs Abs: 1.5 10*3/uL (ref 0.7–4.0)
MCH: 29.4 pg (ref 26.0–34.0)
MCV: 91.3 fL (ref 78.0–100.0)
Neutrophils Relative %: 43 % (ref 43–77)
Platelets: 128 10*3/uL — ABNORMAL LOW (ref 150–400)
RBC: 2.86 MIL/uL — ABNORMAL LOW (ref 4.22–5.81)
RDW: 17.6 % — ABNORMAL HIGH (ref 11.5–15.5)

## 2013-01-05 LAB — PHOSPHORUS: Phosphorus: 4.4 mg/dL (ref 2.3–4.6)

## 2013-01-05 LAB — MAGNESIUM: Magnesium: 2 mg/dL (ref 1.5–2.5)

## 2013-01-05 LAB — TROPONIN I: Troponin I: <0.3 ng/mL (ref <0.30)

## 2013-01-05 NOTE — ED Notes (Signed)
Not eating for the past 3 weeks, no bowel movement for the past 2 weeks. Did not have dialysis today because he was busy calling his doctor about his other issues.

## 2013-01-05 NOTE — ED Provider Notes (Signed)
History    CSN: [id number] Arrival date & time 01/05/13  2002  First MD Initiated Contact with Patient 01/05/13 2009     Chief Complaint  Patient presents with  . Eating Disorder   (Consider location/radiation/quality/duration/timing/severity/associated sxs/prior Treatment) HPI Comments: Pt is a 62 y/o male with ESRD and ESLD who presents with weakness and loss of appetite - he states that he has not had any appetite but states is able to eat a biscuit or a sandwhich every day.  He has not had the energy to go to dialysis today and called his doctor but didn't make it to dialysis.  He has has no fever, no vomiting, no swelling and has not had a BM in 10 days despite daily use of lactulose.  Sx are constant, persistent, nothing makes better or worse and not associated with jaundice, fevers or rashes.  He had endoscopy recently on 12/21/12 at which time he was found to have the below findings:  Impression: 2 short columns of grade 1 esophageal varices. Portal gastropathy. Multiple gastric antral vascular ectasia and some covered with fresh blood. Multiple lesions were treated with argon plasma coagulator.  He has hx of anemia from this source.  The history is provided by the patient and medical records.   Past Medical History  Diagnosis Date  . Rectal bleeding   . Dialysis care   . GI bleed   . Hypertension   . Esophageal varices   . Alcoholic liver disease   . Hepatic encephalopathy   . Ascites   . Diabetes mellitus   . Detached retina   . Renal disorder   . Pneumonia    Past Surgical History  Procedure Laterality Date  . Esophagogastroduodenoscopy  12/31/2010    EGD APC THERAPY  . Esophagogastroduodenoscopy  05/31/2012E    GD APC ABLATION  . Small bowel givens  12/01/2010  . Colonoscopy  09/07/2010  . Esophagogastroduodenoscopy  09/05/2010    OUTLAW  . Lung surgery  6/11    [place]  . Esophagogastroduodenoscopy  03/26/2011    Procedure:  ESOPHAGOGASTRODUODENOSCOPY (EGD);  Surgeon: Malissa Hippo, MD;  [facility];  Service: Endoscopy;  Laterality: N/A;  8:30 am  . Hot hemostasis  03/26/2011    Procedure: HOT HEMOSTASIS (ARGON PLASMA COAGULATION/BICAP);  Surgeon: Malissa Hippo, MD;  [facility];  Service: Endoscopy;  Laterality: N/A;  . Stent in liver    . Esophagogastroduodenoscopy N/A 10/24/2012    Procedure: ESOPHAGOGASTRODUODENOSCOPY (EGD);  Surgeon: Malissa Hippo, MD;  [facility];  Service: Endoscopy;  Laterality: N/A;  . Esophagogastroduodenoscopy N/A 11/22/2012    Procedure: ESOPHAGOGASTRODUODENOSCOPY (EGD);  Surgeon: Charna Elizabeth, MD;  Location: [facility] ENDOSCOPY;  Service: Endoscopy;  Laterality: N/A;  . Paracentesis    . Esophagogastroduodenoscopy N/A 12/21/2012    Procedure: ESOPHAGOGASTRODUODENOSCOPY (EGD);  Surgeon: Malissa Hippo, MD;  [facility];  Service: Endoscopy;  Laterality: N/A;  250-moved to 155 Ann to notify pt  . Hot hemostasis N/A 12/21/2012    Procedure: HOT HEMOSTASIS (ARGON PLASMA COAGULATION/BICAP);  Surgeon: Malissa Hippo, MD;  [facility];  Service: Endoscopy;  Laterality: N/A;   No family history on file. History  Substance Use Topics  . Smoking status: Former Smoker -- 1.00 packs/day for 30 years    Types: Cigarettes    Quit date: 08/11/2011  . Smokeless tobacco: Former Neurosurgeon    Quit date: 05/12/2012  . Alcohol Use: No  Comment: denies-quit 2000    Review of Systems  All other systems reviewed and are negative.    Allergies  Tylenol and Morphine and related  Home Medications   Current Outpatient Rx  Name  Route  Sig  Dispense  Refill  . b complex-vitamin c-folic acid (NEPHRO-VITE) 0.8 MG TABS   Oral   Take 1 tablet by mouth at bedtime.   30 tablet   0   . ciprofloxacin (CIPRO) 500 MG tablet   Oral   Take 1 tablet (500 mg total) by mouth daily.   30 tablet   0   . citalopram (CELEXA) 10 MG tablet   Oral    Take 10 mg by mouth at bedtime.         [name] esomeprazole (NEXIUM) 40 MG capsule   Oral   Take 40 mg by mouth daily.         . furosemide (LASIX) 80 MG tablet   Oral   Take 80 mg by mouth 2 (two) times daily.         [name] glipiZIDE (GLUCOTROL) 5 MG tablet   Oral   Take 1 tablet (5 mg total) by mouth 2 (two) times daily.   60 tablet   0   . hydrocortisone valerate ointment (WESTCORT) 0.2 %   Topical   Apply 1 application topically every Monday, Wednesday, and Friday. For numbing prior to dialysis         . lactulose (CHRONULAC) 10 GM/15ML solution   Oral   Take 20 g by mouth daily as needed (for constipation).         [name] lanthanum (FOSRENOL) 1000 MG chewable tablet   Oral   Chew 500 mg by mouth 4 (four) times daily -  with meals and at bedtime.         . ondansetron (ZOFRAN) 4 MG tablet   Oral   Take 4 mg by mouth daily.          . potassium chloride (K-DUR) 10 MEQ tablet   Oral   Take 10 mEq by mouth 3 (three) times daily. On Mon.,Wed.,Fri.,after dialysis         . traMADol (ULTRAM) 50 MG tablet   Oral   Take 50 mg by mouth 3 (three) times daily as needed for pain.           BP 155/63  Temp(Src) 97.7 F (36.5 C) (Oral)  Resp 20  Ht 6\' 3"  (1.905 m)  Wt 228 lb (103.42 kg)  BMI 28.5 kg/m2  SpO2 99% Physical Exam  Nursing note and vitals reviewed. Constitutional: He appears well-developed and well-nourished. No distress.  HENT:  Head: Normocephalic and atraumatic.  Mouth/Throat: Oropharynx is clear and moist. No oropharyngeal exudate.  Eyes: Conjunctivae and EOM are normal. Pupils are equal, round, and reactive to light. Right eye exhibits no discharge. Left eye exhibits no discharge. No scleral icterus.  Neck: Normal range of motion. Neck supple. No JVD present. No thyromegaly present.  Cardiovascular: Normal rate, regular rhythm, normal heart sounds and intact distal pulses.  Exam reveals no gallop and no friction rub.   No murmur  heard. Pulmonary/Chest: Effort normal. No respiratory distress. He has no wheezes. He has rales ( soft rales at the bases, no distress).  Abdominal: Soft. Bowel sounds are normal. He exhibits no distension and no mass. There is no tenderness.  Distended, non tender, no fluid wave  Musculoskeletal: Normal range of motion. He exhibits no edema and no  tenderness.  Lymphadenopathy:    He has no cervical adenopathy.  Neurological: He is alert. Coordination normal.  No asterixis, follows commands, clear speech, normal strength  Skin: Skin is warm and dry. No rash noted. No erythema.  Psychiatric: He has a normal mood and affect. His behavior is normal.    ED Course  Procedures (including critical care time) Labs Reviewed  COMPREHENSIVE METABOLIC PANEL - Abnormal; Notable for the following:    Potassium 3.2 (*)    Glucose, Bld 111 (*)    Creatinine, Ser 4.92 (*)    Total Protein 5.5 (*)    Albumin 1.6 (*)    Alkaline Phosphatase 120 (*)    GFR calc non Af Amer 12 (*)    GFR calc Af Amer 13 (*)    All other components within normal limits  CBC WITH DIFFERENTIAL - Abnormal; Notable for the following:    RBC 2.86 (*)    Hemoglobin 8.4 (*)    HCT 26.1 (*)    RDW 17.6 (*)    Platelets 128 (*)    Monocytes Relative 16 (*)    Eosinophils Relative 9 (*)    All other components within normal limits  PROTIME-INR - Abnormal; Notable for the following:    Prothrombin Time 15.4 (*)    All other components within normal limits  APTT - Abnormal; Notable for the following:    aPTT 39 (*)    All other components within normal limits  TROPONIN I  MAGNESIUM  PHOSPHORUS  AMMONIA   Dg Chest 2 View  01/05/2013   *RADIOLOGY REPORT*  Clinical Data: Shortness of breath  CHEST - 2 VIEW  Comparison: 11/21/2012; 10/29/2012; CT abdomen pelvis - 10/27/2012  Findings: Grossly unchanged enlarged cardiac silhouette and mediastinal contours.  There is grossly unchanged the lungs remain hyperexpanded with  flattening of bilateral hemidiaphragms. Suspected minimal increase in size of partially loculated right- sided pleural effusion, now with fluid tracking within the right minor fissure. Suspected development of a trace left-sided pleural effusion.  Apparent opacity overlying the right lower lung is favored to represent the area of rounded atelectasis demonstrated on recently performed abdominal CT. Improved aeration of the left lower lung with residual linear heterogeneous opacities have to represent atelectasis or scar.  No new focal airspace opacity.  No definite pleural effusion or pneumothorax.  Unchanged bones.  IMPRESSION: 1.  Suspected minimal increase in size of chronic partially loculated left-sided pleural effusion. 2.  Advanced emphysematous change.  3.  Focal opacity overlying the right lower lung favored to represent the area of rounded atelectasis demonstrated on recently performed abdominal CT.   Original Report Authenticated By: Tacey Ruiz, MD   Dg Abd 1 View  01/05/2013   *RADIOLOGY REPORT*  Clinical Data: Constipation  ABDOMEN - 1 VIEW  Comparison: 09/15/2012; CT abdomen pelvis - 10/27/2012  Findings:  Paucity of bowel gas without definite evidence of obstruction. Nondiagnostic evaluation for pneumoperitoneum secondary to supine positioning exclusion of the lower thorax.  No definite pneumatosis.  Punctate calcifications overlying the left upper and right lower abdominal quadrants favored to represent ingested radiopaque pill fragments.  Multilevel thoracolumbar spine degenerative change.  IMPRESSION: Paucity of bowel gas without evidence of obstruction.   Original Report Authenticated By: Tacey Ruiz, MD   1. Renal failure   2. Hypokalemia     MDM  Good thrill in L forearm fistula.  He has normal VS and does not appear in distress but has missed dialysis and  is at risk for electrolyte abnormalities and pulmonary edema.  He has no stools but is not vomiting and not overtly tender.  Will  check liver and renal function, ua if possible as he states that he still makes small am't of urine every day.    ED ECG REPORT  I personally interpreted this EKG   Date: 01/06/2013   Rate: 58  Rhythm: sinus bradycardia  QRS Axis: normal  Intervals: normal  ST/T Wave abnormalities: normal  Conduction Disutrbances:none  Narrative Interpretation:   Old EKG Reviewed: c/w 11/21/12, rate has slowed, voltage slightly lower today  VS remain stable, labs show slight hypokalemia, baseline renal function, baseline anemia, slight thrombocytopenia, normal mag and phos.  Trop normal and ECG not concerning.    I have personally seen and interpretted the CXR and find there to be some atelectases, small effusion on the L (chronic), and opacity in the R lung - c/w prior findings - None of these are new findings, he has no pulmonary symptoms and the effusion may well be related to his renal and liver failures.  INR 1.25, protein slightly low but LFT's at this time are non concerning.  Ammonia also normal.  Wil d/c home  Pt reexamined at 9:45, has no focal defecits, clear historian, well appearing to go home - encouraged f/u for TSH as well.         Vida Roller, MD 01/06/13 [phone]

## 2013-01-08 ENCOUNTER — Ambulatory Visit (INDEPENDENT_AMBULATORY_CARE_PROVIDER_SITE_OTHER): Payer: Medicare Other | Admitting: Internal Medicine

## 2013-01-09 ENCOUNTER — Telehealth (INDEPENDENT_AMBULATORY_CARE_PROVIDER_SITE_OTHER): Payer: Self-pay | Admitting: *Deleted

## 2013-01-09 ENCOUNTER — Ambulatory Visit (INDEPENDENT_AMBULATORY_CARE_PROVIDER_SITE_OTHER): Payer: Medicare Other | Admitting: Internal Medicine

## 2013-01-09 NOTE — Telephone Encounter (Signed)
noted 

## 2013-01-09 NOTE — Telephone Encounter (Signed)
LM stating he had no transportation to get to his apt. He has started taking a laxative and seems to be helping. Timothy Chan said he is a lot better but still not wanting to eat. He is needing something to help him eat. His return phone number is [phone].

## 2013-01-10 ENCOUNTER — Encounter (HOSPITAL_COMMUNITY): Payer: Self-pay

## 2013-01-15 ENCOUNTER — Emergency Department (HOSPITAL_COMMUNITY): Payer: Medicare Other

## 2013-01-15 ENCOUNTER — Emergency Department (HOSPITAL_COMMUNITY)
Admission: EM | Admit: 2013-01-15 | Discharge: 2013-01-15 | Disposition: A | Discharge status: Home / Self Care | Payer: Medicare Other | Attending: Emergency Medicine | Admitting: Emergency Medicine

## 2013-01-15 ENCOUNTER — Other Ambulatory Visit: Payer: Self-pay

## 2013-01-15 ENCOUNTER — Encounter (HOSPITAL_COMMUNITY): Payer: Self-pay

## 2013-01-15 DIAGNOSIS — K219 Gastro-esophageal reflux disease without esophagitis: Secondary | ICD-10-CM

## 2013-01-15 DIAGNOSIS — R5381 Other malaise: Secondary | ICD-10-CM | POA: Insufficient documentation

## 2013-01-15 DIAGNOSIS — R188 Other ascites: Secondary | ICD-10-CM

## 2013-01-15 DIAGNOSIS — R52 Pain, unspecified: Secondary | ICD-10-CM | POA: Insufficient documentation

## 2013-01-15 DIAGNOSIS — Z79899 Other long term (current) drug therapy: Secondary | ICD-10-CM | POA: Insufficient documentation

## 2013-01-15 DIAGNOSIS — Z8701 Personal history of pneumonia (recurrent): Secondary | ICD-10-CM | POA: Insufficient documentation

## 2013-01-15 DIAGNOSIS — R109 Unspecified abdominal pain: Secondary | ICD-10-CM

## 2013-01-15 DIAGNOSIS — Z87891 Personal history of nicotine dependence: Secondary | ICD-10-CM | POA: Insufficient documentation

## 2013-01-15 DIAGNOSIS — Z8719 Personal history of other diseases of the digestive system: Secondary | ICD-10-CM | POA: Insufficient documentation

## 2013-01-15 DIAGNOSIS — R5383 Other fatigue: Secondary | ICD-10-CM | POA: Insufficient documentation

## 2013-01-15 DIAGNOSIS — N186 End stage renal disease: Secondary | ICD-10-CM

## 2013-01-15 DIAGNOSIS — Z8669 Personal history of other diseases of the nervous system and sense organs: Secondary | ICD-10-CM | POA: Insufficient documentation

## 2013-01-15 DIAGNOSIS — E119 Type 2 diabetes mellitus without complications: Secondary | ICD-10-CM | POA: Insufficient documentation

## 2013-01-15 DIAGNOSIS — I120 Hypertensive chronic kidney disease with stage 5 chronic kidney disease or end stage renal disease: Secondary | ICD-10-CM | POA: Insufficient documentation

## 2013-01-15 DIAGNOSIS — R1011 Right upper quadrant pain: Secondary | ICD-10-CM | POA: Insufficient documentation

## 2013-01-15 DIAGNOSIS — R630 Anorexia: Secondary | ICD-10-CM | POA: Insufficient documentation

## 2013-01-15 DIAGNOSIS — IMO0002 Reserved for concepts with insufficient information to code with codable children: Secondary | ICD-10-CM | POA: Insufficient documentation

## 2013-01-15 DIAGNOSIS — R079 Chest pain, unspecified: Secondary | ICD-10-CM | POA: Insufficient documentation

## 2013-01-15 LAB — BASIC METABOLIC PANEL
BUN: 24 mg/dL — ABNORMAL HIGH (ref 6–23)
CO2: 28 mEq/L (ref 19–32)
Chloride: 103 mEq/L (ref 96–112)
GFR calc non Af Amer: 7 mL/min — ABNORMAL LOW (ref >90)
Glucose, Bld: 109 mg/dL — ABNORMAL HIGH (ref 70–99)
Potassium: 4.5 mEq/L (ref 3.5–5.1)
Sodium: 139 mEq/L (ref 135–145)

## 2013-01-15 LAB — CBC WITH DIFFERENTIAL/PLATELET
Basophils Relative: 1 % (ref 0–1)
Eosinophils Absolute: 0.3 10*3/uL (ref 0.0–0.7)
HCT: 32.7 % — ABNORMAL LOW (ref 39.0–52.0)
Hemoglobin: 10.5 g/dL — ABNORMAL LOW (ref 13.0–17.0)
Lymphs Abs: 2.1 10*3/uL (ref 0.7–4.0)
MCH: 29.1 pg (ref 26.0–34.0)
MCHC: 32.1 g/dL (ref 30.0–36.0)
MCV: 90.6 fL (ref 78.0–100.0)
Monocytes Absolute: 0.8 10*3/uL (ref 0.1–1.0)
Monocytes Relative: 13 % — ABNORMAL HIGH (ref 3–12)
Neutrophils Relative %: 49 % (ref 43–77)
RBC: 3.61 MIL/uL — ABNORMAL LOW (ref 4.22–5.81)

## 2013-01-15 LAB — URINALYSIS, ROUTINE W REFLEX MICROSCOPIC
Glucose, UA: NEGATIVE mg/dL
Ketones, ur: NEGATIVE mg/dL
Leukocytes, UA: NEGATIVE
pH: 8 (ref 5.0–8.0)

## 2013-01-15 LAB — HEPATIC FUNCTION PANEL
Alkaline Phosphatase: 167 U/L — ABNORMAL HIGH (ref 39–117)
Indirect Bilirubin: 0.8 mg/dL (ref 0.3–0.9)
Total Bilirubin: 1.2 mg/dL (ref 0.3–1.2)

## 2013-01-15 LAB — URINE MICROSCOPIC-ADD ON

## 2013-01-15 LAB — LIPASE, BLOOD: Lipase: 27 U/L (ref 11–59)

## 2013-01-15 MED ORDER — GI COCKTAIL ~~LOC~~
30.0000 mL | Freq: Once | ORAL | Status: AC
  Administered 2013-01-15: 30 mL via ORAL
  Filled 2013-01-15: qty 30

## 2013-01-15 MED ORDER — SUCRALFATE 1 GM/10ML PO SUSP
1.0000 g | Freq: Three times a day (TID) | ORAL | Status: DC
  Administered 2013-01-15: 1 g via ORAL
  Filled 2013-01-15 (×7): qty 10

## 2013-01-15 MED ORDER — SUCRALFATE 1 GM/10ML PO SUSP: 1.0000 g | Freq: Four times a day (QID) | ORAL | Status: DC

## 2013-01-15 NOTE — ED Provider Notes (Addendum)
History  This chart was scribed for Timothy Melter, MD, by Yevette Edwards, ED Scribe. This patient was seen in room APA09/[facility]09 and the patient's care was started at 2:43 PM.  CSN: [id number] Arrival date & time 01/15/13  1220  First MD Initiated Contact with Patient 01/15/13 1400     Chief Complaint  Patient presents with  . Gastrophageal Reflux    The history is provided by the patient. No language interpreter was used.   HPI Comments: Timothy Chan is a 62 y.o. male who presents to the Emergency Department complaining of intermittent acid reflex which has been occurring for the previous three weeks. The pt states he has tried Tums and Rolaids to mitigate the symptoms, but with no resolution. He has also experienced a diminished appetite, a decreased ability to sleep, and intermittent mid-sternal chest pain which began about 1 am this morning. The pt reports that he had experienced a similar episode of chest pain a month ago. He denies having a fever or feeling any increased SOB.   He has a appointment with his PCP in two weeks. The pt has missed several of his dialysis appointments because he states he has felt too weak. He has a h/o of HTN, DM, alcoholic liver disease, and esophagogastroduodenoscopy. He is a former smoker, and he reports not drinking since 2000.   Past Medical History  Diagnosis Date  . Rectal bleeding   . Dialysis care   . GI bleed   . Hypertension   . Esophageal varices   . Alcoholic liver disease   . Hepatic encephalopathy   . Ascites   . Diabetes mellitus   . Detached retina   . Renal disorder   . Pneumonia    Past Surgical History  Procedure Laterality Date  . Esophagogastroduodenoscopy  12/31/2010    EGD [facility] THERAPY  . Esophagogastroduodenoscopy  05/31/2012E    GD [facility] ABLATION  . Small bowel givens  12/01/2010  . Colonoscopy  09/07/2010  . Esophagogastroduodenoscopy  09/05/2010    OUTLAW  . Lung surgery  6/11    [place]  .  Esophagogastroduodenoscopy  03/26/2011    Procedure: ESOPHAGOGASTRODUODENOSCOPY (EGD);  Surgeon: Malissa Hippo, MD;  Location: [facility];  Service: Endoscopy;  Laterality: N/A;  8:30 am  . Hot hemostasis  03/26/2011    Procedure: HOT HEMOSTASIS (ARGON PLASMA COAGULATION/BICAP);  Surgeon: Malissa Hippo, MD;  Location: [facility];  Service: Endoscopy;  Laterality: N/A;  . Stent in liver    . Esophagogastroduodenoscopy N/A 10/24/2012    Procedure: ESOPHAGOGASTRODUODENOSCOPY (EGD);  Surgeon: Malissa Hippo, MD;  Location: [facility];  Service: Endoscopy;  Laterality: N/A;  . Esophagogastroduodenoscopy N/A 11/22/2012    Procedure: ESOPHAGOGASTRODUODENOSCOPY (EGD);  Surgeon: Charna Elizabeth, MD;  Location: [facility] ENDOSCOPY;  Service: Endoscopy;  Laterality: N/A;  . Paracentesis    . Esophagogastroduodenoscopy N/A 12/21/2012    Procedure: ESOPHAGOGASTRODUODENOSCOPY (EGD);  Surgeon: Malissa Hippo, MD;  Location: [facility];  Service: Endoscopy;  Laterality: N/A;  250-moved to 155 Ann to notify pt  . Hot hemostasis N/A 12/21/2012    Procedure: HOT HEMOSTASIS (ARGON PLASMA COAGULATION/BICAP);  Surgeon: Malissa Hippo, MD;  Location: [facility];  Service: Endoscopy;  Laterality: N/A;   No family history on file. History  Substance Use Topics  . Smoking status: Former Smoker -- 1.00 packs/day for 30 years    Types: Cigarettes    Quit date: 08/11/2011  . Smokeless tobacco: Former Neurosurgeon  Quit date: 05/12/2012  . Alcohol Use: No     Comment: denies-quit 2000    Review of Systems  Constitutional: Positive for activity change and appetite change. Negative for fever.  Cardiovascular: Positive for chest pain.  Neurological: Positive for weakness.  All other systems reviewed and are negative.    Allergies  Tylenol and Morphine and related  Home Medications   Current Outpatient Rx  Name  Route  Sig  Dispense  Refill  . b complex-vitamin c-folic acid (NEPHRO-VITE) 0.8 MG TABS   Oral    Take 1 tablet by mouth at bedtime.   30 tablet   0   . ciprofloxacin (CIPRO) 500 MG tablet   Oral   Take 1 tablet (500 mg total) by mouth daily.   30 tablet   0   . esomeprazole (NEXIUM) 40 MG capsule   Oral   Take 40 mg by mouth daily as needed (acid reflux).          . furosemide (LASIX) 80 MG tablet   Oral   Take 80 mg by mouth 2 (two) times daily.         [name] glipiZIDE (GLUCOTROL) 5 MG tablet   Oral   Take 5 mg by mouth 2 (two) times daily as needed (sugar control).         . hydrocortisone valerate ointment (WESTCORT) 0.2 %   Topical   Apply 1 application topically every Monday, Wednesday, and Friday. For numbing prior to dialysis         . lanthanum (FOSRENOL) 1000 MG chewable tablet   Oral   Chew 500-1,000 mg by mouth 4 (four) times daily -  with meals and at bedtime. Takes 1/2 tab (500 mg) with snacks and 1 tab (1,000 mg) with meals         . ondansetron (ZOFRAN) 4 MG tablet   Oral   Take 4 mg by mouth daily as needed for nausea.          . traMADol (ULTRAM) 50 MG tablet   Oral   Take 50 mg by mouth 3 (three) times daily as needed for pain.          [name] lactulose (CHRONULAC) 10 GM/15ML solution   Oral   Take 20 g by mouth daily as needed (for constipation).         . sucralfate (CARAFATE) 1 GM/10ML suspension   Oral   Take 10 mLs (1 g total) by mouth 4 (four) times daily.   420 mL   0    Triage Vitals: BP 125/50  Pulse 60  Temp(Src) 97.1 F (36.2 C) (Oral)  Resp 22  Ht 6\' 3"  (1.905 m)  Wt 220 lb (99.791 kg)  BMI 27.5 kg/m2  SpO2 99%  Physical Exam  Nursing note and vitals reviewed. Constitutional: He is oriented to person, place, and time. He appears well-developed and well-nourished. No distress.  HENT:  Head: Normocephalic and atraumatic.  Eyes: EOM are normal. Pupils are equal, round, and reactive to light.  Neck: Normal range of motion. Neck supple. No tracheal deviation present.  Cardiovascular: Normal rate.    Pulmonary/Chest: Effort normal. No respiratory distress.  Abdominal: There is tenderness.  RUQ tenderness.  Musculoskeletal: Normal range of motion.  Neurological: He is alert and oriented to person, place, and time.  Skin: Skin is warm and dry.  Psychiatric: He has a normal mood and affect. His behavior is normal.    ED Course  Procedures (including  critical care time)  DIAGNOSTIC STUDIES: Oxygen Saturation is 99% on room air, normal by my interpretation.    COORDINATION OF CARE:  2:48 PM- Discussed treatment plan with pt, and the pt agreed.   3:48 PM- Rechecked pt, and discussed his dialysis treatment. He had been peritoneal dialysis until a month ago, but is now on hemodialysis.    5:01 PM-Consult complete with Dr. Fausto Skillern. Patient case explained and discussed. He agrees pt. Does not need urgent dialysis, and can come to the unit tomorrow for dialysis.  Call ended at 1705  5:13 PM-Consult complete with Dr Karilyn Cota. Patient case explained and discussed. He agrees to treat patient as Outpatient, by arranging OP paracentesis further evaluation and treatment. Call ended at 1717    Date: 01/15/13  Rate: 60  Rhythm: normal sinus rhythm  QRS Axis: normal  PR and QT Intervals: normal  ST/T Wave abnormalities: normal  PR and QRS Conduction Disutrbances:none  Narrative Interpretation:   Old EKG Reviewed: unchanged- 01/08/13   Labs Reviewed  GLUCOSE, CAPILLARY - Abnormal; Notable for the following:    Glucose-Capillary 116 (*)    All other components within normal limits  BASIC METABOLIC PANEL - Abnormal; Notable for the following:    Glucose, Bld 109 (*)    BUN 24 (*)    Creatinine, Ser 7.54 (*)    GFR calc non Af Amer 7 (*)    GFR calc Af Amer 8 (*)    All other components within normal limits  CBC WITH DIFFERENTIAL - Abnormal; Notable for the following:    RBC 3.61 (*)    Hemoglobin 10.5 (*)    HCT 32.7 (*)    RDW 18.0 (*)    Platelets 146 (*)    Monocytes Relative 13  (*)    All other components within normal limits  URINALYSIS, ROUTINE W REFLEX MICROSCOPIC - Abnormal; Notable for the following:    Hgb urine dipstick TRACE (*)    Protein, ur TRACE (*)    All other components within normal limits  HEPATIC FUNCTION PANEL - Abnormal; Notable for the following:    Albumin 2.0 (*)    AST 52 (*)    Alkaline Phosphatase 167 (*)    Bilirubin, Direct 0.4 (*)    All other components within normal limits  URINE CULTURE  TROPONIN I  LIPASE, BLOOD  URINE MICROSCOPIC-ADD ON   Dg Chest 2 View  01/15/2013   *RADIOLOGY REPORT*  Clinical Data: Reflux.  Weakness  CHEST - 2 VIEW  Comparison: 01/05/2013  Findings: Mild cardiac enlargement.  Lungs are hyperinflated and there are coarsened interstitial markings of COPD.  Chronic pleural and parenchymal changes involving the right midlung and right base are again noted.  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  Blunting of the left costophrenic angle may reflect pleural scarring or small effusion. No superimposed airspace consolidation or edema noted.  IMPRESSION:  1.  No acute findings. 2.  COPD.   Original Report Authenticated By: Signa Kell, M.D.   1. Abdominal pain, acute   2. GERD (gastroesophageal reflux disease)   3. Ascites   4. ESRD (end stage renal disease)     MDM  Nonspecific abdominal pain, with worsening ascites. Doubt SBP, as he has no fever, and normal white blood cell count and is taking prophylactic Cipro. Doubt ACS, PE, or pneumonia. He does not appear fluid overloaded. Doubt metabolic instability, serious bacterial infection or impending vascular collapse; the patient is stable for  discharge.   Nursing Notes Reviewed/ Care Coordinated, and agree without changes. Applicable Imaging Reviewed.  Interpretation of Laboratory Data incorporated into ED treatment   Plan: Home Medications-  Carafate; Home Treatments and Observation- rest, continue usual medication; return  here if the recommended treatment, does not improve the symptoms; Recommended follow up- go to dialysis tomorrow, discuss with GI for arrangement of paracentesis, as soon as possible     I personally performed the services described in this documentation, which was scribed in my presence. The recorded information has been reviewed and is accurate.      Timothy Melter, MD 01/15/13 4098  Timothy Melter, MD 01/15/13 2204

## 2013-01-15 NOTE — ED Notes (Signed)
Pt reports acid reflux since last night at 1am, has tried rolaids and another pill from his doctor but is not helping, he stated that he can feel the acid coming up his throat.

## 2013-01-16 ENCOUNTER — Other Ambulatory Visit (INDEPENDENT_AMBULATORY_CARE_PROVIDER_SITE_OTHER): Payer: Self-pay | Admitting: Internal Medicine

## 2013-01-16 DIAGNOSIS — R188 Other ascites: Secondary | ICD-10-CM

## 2013-01-16 LAB — URINE CULTURE
Colony Count: NO GROWTH
Culture: NO GROWTH

## 2013-01-16 NOTE — Telephone Encounter (Signed)
No answer at home. Unable to leave a message. 

## 2013-01-16 NOTE — Telephone Encounter (Signed)
Did we do anything for this patient not being able to eat?

## 2013-01-16 NOTE — Telephone Encounter (Signed)
I have talked with [place] at [facility] and they will cancel this order for a paracentesis.

## 2013-01-17 ENCOUNTER — Other Ambulatory Visit (INDEPENDENT_AMBULATORY_CARE_PROVIDER_SITE_OTHER): Payer: Self-pay | Admitting: Internal Medicine

## 2013-01-17 DIAGNOSIS — R188 Other ascites: Secondary | ICD-10-CM

## 2013-01-18 ENCOUNTER — Ambulatory Visit (INDEPENDENT_AMBULATORY_CARE_PROVIDER_SITE_OTHER): Payer: Medicare Other | Admitting: Internal Medicine

## 2013-01-18 ENCOUNTER — Encounter (INDEPENDENT_AMBULATORY_CARE_PROVIDER_SITE_OTHER): Payer: Self-pay | Admitting: Internal Medicine

## 2013-01-18 VITALS — BP 110/48 | HR 56 | Temp 97.8°F | Ht 73.0 in | Wt 210.0 lb

## 2013-01-18 DIAGNOSIS — R630 Anorexia: Secondary | ICD-10-CM | POA: Insufficient documentation

## 2013-01-18 DIAGNOSIS — R188 Other ascites: Secondary | ICD-10-CM

## 2013-01-18 MED ORDER — MEGESTROL ACETATE 40 MG PO TABS: 40.0000 mg | ORAL_TABLET | Freq: Four times a day (QID) | ORAL | Status: DC

## 2013-01-18 MED ORDER — CIPROFLOXACIN HCL 500 MG PO TABS: 500.0000 mg | ORAL_TABLET | Freq: Every day | ORAL | Status: DC

## 2013-01-18 NOTE — Progress Notes (Signed)
Subjective:     Patient ID: Timothy Chan, male   DOB: 1950-10-20, 62 y.o.   MRN: [id number]  HPI Here today for f/u. He tells me he still has acid reflux. It is occurs everytime he eats. He also c/o rt knee pain. Stools are brown. No melena or bright red rectal bleeding. He has a stool x 3 a day.  Appetite is not good. No appetite. He has lost 20 pounds since his last visit. There is not nausea. He just does not want to eat it.  He has lost 20 pounds since his last visit.  Dialysis M-W--F  Hx of Peritonitis in April.     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 . He has been evaluated at [facility] and felt not to be candidate for hepatorenal transplant   .        11/22/2012 EGD, Dr. Loreta Ave: : Iron deficiency anemia, Hematochezia, coffee ground emesis.  IMPRESSION: 1) Normal appearing esophagus.  2) Grade I esophagitis at the GEJ.  3) Portal hypertensive gastropathy.  4) Gastric antral vascular ectasia.  5) Normal proximal small bowel.  10/24/2012 Dr. Karilyn Cota: Procedure: EGD with [facility] of GAVE.  Indications: Patient is 62 year old Caucasian male with multiple medical problems including advanced liver disease complicated by hepatic encephalopathy who was developed melena and drop in his H&H. 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 He has not required PRBCs until this admission.   CBC    Component Value Date/Time   WBC 6.1 01/15/2013 1503   RBC 3.61* 01/15/2013 1503   HGB 10.5* 01/15/2013 1503   HCT 32.7* 01/15/2013 1503   PLT 146* 01/15/2013 1503   MCV 90.6 01/15/2013 1503   MCH 29.1 01/15/2013 1503   MCHC 32.1 01/15/2013 1503   RDW 18.0* 01/15/2013 1503   LYMPHSABS 2.1 01/15/2013 1503   MONOABS 0.8 01/15/2013 1503   EOSABS 0.3 01/15/2013 1503   BASOSABS 0.1 01/15/2013 1503   01/05/2013 Hemoglobin 8.4  CMP     Component Value Date/Time   NA 139 01/15/2013 1503   K 4.5 01/15/2013 1503   CL 103 01/15/2013 1503   CO2 28 01/15/2013 1503    GLUCOSE 109* 01/15/2013 1503   BUN 24* 01/15/2013 1503   CREATININE 7.54* 01/15/2013 1503   CREATININE 8.33* 12/19/2012 1550   CALCIUM 10.4 01/15/2013 1503   PROT 6.7 01/15/2013 1503   ALBUMIN 2.0* 01/15/2013 1503   AST 52* 01/15/2013 1503   ALT 17 01/15/2013 1503   ALKPHOS 167* 01/15/2013 1503   BILITOT 1.2 01/15/2013 1503   GFRNONAA 7* 01/15/2013 1503   GFRAA 8* 01/15/2013 1503         Review of Systems Current Outpatient Prescriptions  Medication Sig Dispense Refill  . b complex-vitamin c-folic acid (NEPHRO-VITE) 0.8 MG TABS Take 1 tablet by mouth at bedtime.  30 tablet  0  . esomeprazole (NEXIUM) 40 MG capsule Take 40 mg by mouth daily as needed (acid reflux).       . furosemide (LASIX) 80 MG tablet Take 80 mg by mouth 2 (two) times daily.      [name] glipiZIDE (GLUCOTROL) 5 MG tablet Take 5 mg by mouth 2 (two) times daily as needed (sugar control).      . hydrocortisone valerate ointment (WESTCORT) 0.2 % Apply 1 application topically every Monday, Wednesday, and Friday. For numbing prior to dialysis      . lactulose (CHRONULAC)  10 GM/15ML solution Take 20 g by mouth daily as needed (for constipation).      [name] lanthanum (FOSRENOL) 1000 MG chewable tablet Chew 500-1,000 mg by mouth 4 (four) times daily -  with meals and at bedtime. Takes 1/2 tab (500 mg) with snacks and 1 tab (1,000 mg) with meals      . ondansetron (ZOFRAN) 4 MG tablet Take 4 mg by mouth daily as needed for nausea.       . sucralfate (CARAFATE) 1 GM/10ML suspension Take 10 mLs (1 g total) by mouth 4 (four) times daily.  420 mL  0  . traMADol (ULTRAM) 50 MG tablet Take 50 mg by mouth 3 (three) times daily as needed for pain.       . ciprofloxacin (CIPRO) 500 MG tablet Take 1 tablet (500 mg total) by mouth daily.  30 tablet  0   No current facility-administered medications for this visit.   Past Medical History  Diagnosis Date  . Rectal bleeding   . Dialysis care   . GI bleed   . Hypertension   . Esophageal varices   . Alcoholic  liver disease   . Hepatic encephalopathy   . Ascites   . Diabetes mellitus   . Detached retina   . Renal disorder   . Pneumonia    Past Surgical History  Procedure Laterality Date  . Esophagogastroduodenoscopy  12/31/2010    EGD [facility] THERAPY  . Esophagogastroduodenoscopy  05/31/2012E    GD [facility] ABLATION  . Small bowel givens  12/01/2010  . Colonoscopy  09/07/2010  . Esophagogastroduodenoscopy  09/05/2010    OUTLAW  . Lung surgery  6/11    [place]  . Esophagogastroduodenoscopy  03/26/2011    Procedure: ESOPHAGOGASTRODUODENOSCOPY (EGD);  Surgeon: Malissa Hippo, MD;  Location: [facility];  Service: Endoscopy;  Laterality: N/A;  8:30 am  . Hot hemostasis  03/26/2011    Procedure: HOT HEMOSTASIS (ARGON PLASMA COAGULATION/BICAP);  Surgeon: Malissa Hippo, MD;  Location: [facility];  Service: Endoscopy;  Laterality: N/A;  . Stent in liver    . Esophagogastroduodenoscopy N/A 10/24/2012    Procedure: ESOPHAGOGASTRODUODENOSCOPY (EGD);  Surgeon: Malissa Hippo, MD;  Location: [facility];  Service: Endoscopy;  Laterality: N/A;  . Esophagogastroduodenoscopy N/A 11/22/2012    Procedure: ESOPHAGOGASTRODUODENOSCOPY (EGD);  Surgeon: Charna Elizabeth, MD;  Location: [facility] ENDOSCOPY;  Service: Endoscopy;  Laterality: N/A;  . Paracentesis    . Esophagogastroduodenoscopy N/A 12/21/2012    Procedure: ESOPHAGOGASTRODUODENOSCOPY (EGD);  Surgeon: Malissa Hippo, MD;  [facility]: [facility];  Service: Endoscopy;  Laterality: N/A;  250-moved to 155 Ann to notify pt  . Hot hemostasis N/A 12/21/2012    Procedure: HOT HEMOSTASIS (ARGON PLASMA COAGULATION/BICAP);  Surgeon: Malissa Hippo, MD;  Location: [facility];  Service: Endoscopy;  Laterality: N/A;   Allergies  Allergen Reactions  . Tylenol (Acetaminophen) Other (See Comments)    cirrhosis  . Morphine And Related Itching       Objective:   Physical Exam  Filed Vitals:   01/18/13 1124  BP: 110/48  Pulse: 56  Temp: 97.8 F (36.6 C)   Height: 6\' 1"  (1.854 m)  Weight: 210 lb (95.255 kg)    Alert and oriented. Skin warm and dry. Oral mucosa is moist.   . Sclera anicteric, conjunctivae is pink. Thyroid not enlarged. No cervical lymphadenopathy. Lungs clear. Heart regular rate and rhythm.  Abdomen is soft, distended.  I did not feel a fluid wave.  Bowel sounds are positive. No hepatomegaly. No abdominal masses felt. No tenderness.  No edema to lower extremities.       Assessment:    Anorexia. He has lost 20 pounds since his last visit. There is no nausea or vomiting associated with his symptoms. I discussed with Dr. Karilyn Cota.   Plan:    Megace 400mg  four times a day. ( I did not see any contraindications for this medications for [facility]). PR Monday. He is scheduled for an EGD this month.

## 2013-01-18 NOTE — Patient Instructions (Addendum)
Megace 400mg  four times a day. Go to dialysis as scheduled.  EGD this month.  PR Monday

## 2013-01-25 ENCOUNTER — Ambulatory Visit (HOSPITAL_COMMUNITY)
Admission: RE | Admit: 2013-01-25 | Discharge: 2013-01-25 | Disposition: A | Discharge status: Home / Self Care | Payer: Medicare Other | Source: Ambulatory Visit | Attending: Internal Medicine | Admitting: Internal Medicine

## 2013-01-25 ENCOUNTER — Encounter (HOSPITAL_COMMUNITY)
Admission: RE | Disposition: A | Discharge status: Home / Self Care | Payer: Self-pay | Source: Ambulatory Visit | Attending: Internal Medicine

## 2013-01-25 ENCOUNTER — Encounter (HOSPITAL_COMMUNITY): Payer: Self-pay | Admitting: *Deleted

## 2013-01-25 DIAGNOSIS — K766 Portal hypertension: Secondary | ICD-10-CM

## 2013-01-25 DIAGNOSIS — I8500 Esophageal varices without bleeding: Secondary | ICD-10-CM | POA: Insufficient documentation

## 2013-01-25 DIAGNOSIS — E119 Type 2 diabetes mellitus without complications: Secondary | ICD-10-CM | POA: Insufficient documentation

## 2013-01-25 DIAGNOSIS — K208 Other esophagitis without bleeding: Secondary | ICD-10-CM | POA: Insufficient documentation

## 2013-01-25 DIAGNOSIS — K921 Melena: Secondary | ICD-10-CM

## 2013-01-25 DIAGNOSIS — K922 Gastrointestinal hemorrhage, unspecified: Secondary | ICD-10-CM

## 2013-01-25 DIAGNOSIS — Z79899 Other long term (current) drug therapy: Secondary | ICD-10-CM | POA: Insufficient documentation

## 2013-01-25 DIAGNOSIS — K319 Disease of stomach and duodenum, unspecified: Secondary | ICD-10-CM | POA: Insufficient documentation

## 2013-01-25 DIAGNOSIS — K769 Liver disease, unspecified: Secondary | ICD-10-CM | POA: Insufficient documentation

## 2013-01-25 DIAGNOSIS — I10 Essential (primary) hypertension: Secondary | ICD-10-CM | POA: Insufficient documentation

## 2013-01-25 DIAGNOSIS — N289 Disorder of kidney and ureter, unspecified: Secondary | ICD-10-CM | POA: Insufficient documentation

## 2013-01-25 DIAGNOSIS — D649 Anemia, unspecified: Secondary | ICD-10-CM

## 2013-01-25 DIAGNOSIS — F1020 Alcohol dependence, uncomplicated: Secondary | ICD-10-CM | POA: Insufficient documentation

## 2013-01-25 DIAGNOSIS — K31811 Angiodysplasia of stomach and duodenum with bleeding: Secondary | ICD-10-CM

## 2013-01-25 DIAGNOSIS — K31819 Angiodysplasia of stomach and duodenum without bleeding: Secondary | ICD-10-CM | POA: Insufficient documentation

## 2013-01-25 DIAGNOSIS — K7030 Alcoholic cirrhosis of liver without ascites: Secondary | ICD-10-CM | POA: Insufficient documentation

## 2013-01-25 DIAGNOSIS — Z8701 Personal history of pneumonia (recurrent): Secondary | ICD-10-CM | POA: Insufficient documentation

## 2013-01-25 DIAGNOSIS — Z87891 Personal history of nicotine dependence: Secondary | ICD-10-CM | POA: Insufficient documentation

## 2013-01-25 HISTORY — PX: HOT HEMOSTASIS: SHX5433

## 2013-01-25 HISTORY — PX: ESOPHAGOGASTRODUODENOSCOPY: SHX5428

## 2013-01-25 SURGERY — EGD (ESOPHAGOGASTRODUODENOSCOPY)
Anesthesia: Moderate Sedation

## 2013-01-25 MED ORDER — MEPERIDINE HCL 25 MG/ML IJ SOLN
INTRAMUSCULAR | Status: DC | PRN
  Administered 2013-01-25 (×2): 25 mg via INTRAVENOUS

## 2013-01-25 MED ORDER — STERILE WATER FOR IRRIGATION IR SOLN
Status: DC | PRN
  Administered 2013-01-25: 13:00:00

## 2013-01-25 MED ORDER — MIDAZOLAM HCL 5 MG/5ML IJ SOLN
INTRAMUSCULAR | Status: DC | PRN
  Administered 2013-01-25 (×3): 2 mg via INTRAVENOUS

## 2013-01-25 MED ORDER — MIDAZOLAM HCL 5 MG/5ML IJ SOLN
INTRAMUSCULAR | Status: AC
  Filled 2013-01-25: qty 10

## 2013-01-25 MED ORDER — BUTAMBEN-TETRACAINE-BENZOCAINE 2-2-14 % EX AERO
INHALATION_SPRAY | CUTANEOUS | Status: DC | PRN
  Administered 2013-01-25: 2 via TOPICAL

## 2013-01-25 MED ORDER — MEPERIDINE HCL 50 MG/ML IJ SOLN
INTRAMUSCULAR | Status: AC
  Filled 2013-01-25: qty 1

## 2013-01-25 MED ORDER — ESOMEPRAZOLE MAGNESIUM 40 MG PO CPDR: 40.0000 mg | DELAYED_RELEASE_CAPSULE | Freq: Every day | ORAL | Status: DC

## 2013-01-25 MED ORDER — SODIUM CHLORIDE 0.9 % IV SOLN
INTRAVENOUS | Status: DC
  Administered 2013-01-25: 1000 mL via INTRAVENOUS

## 2013-01-25 NOTE — H&P (Addendum)
Timothy Chan is an 62 y.o. male.   Chief Complaint: Patient's here for EGD and APC of gastric antral vascular ectasia. HPI: Patient is 62 year old Caucasian male with multiple medical problems including end-stage liver disease who was recurrent melena secondary to bleeding from gastric antral vascular ectasia. His last EGD and APC therapy was about 4 weeks ago. He has not had any melena since then. H&H 10 days ago was 10.5 and 32.7. He is requiring intermittent abdominal paracenteses and the last one was on 12/25/2012. He denies nausea or vomiting. He is having intermittent heartburn and on some days he takes second dose of Nexium. He denies abdominal pain fever or chills.  Past Medical History  Diagnosis Date  . Rectal bleeding   . Dialysis care   . GI bleed   . Hypertension   . Esophageal varices   . Alcoholic liver disease   . Hepatic encephalopathy   . Ascites   . Diabetes mellitus   . Detached retina   . Renal disorder   . Pneumonia     Past Surgical History  Procedure Laterality Date  . Esophagogastroduodenoscopy  12/31/2010    EGD [facility] THERAPY  . Esophagogastroduodenoscopy  05/31/2012E    GD [facility] ABLATION  . Small bowel givens  12/01/2010  . Colonoscopy  09/07/2010  . Esophagogastroduodenoscopy  09/05/2010    OUTLAW  . Lung surgery  6/11    [place]  . Esophagogastroduodenoscopy  03/26/2011    Procedure: ESOPHAGOGASTRODUODENOSCOPY (EGD);  Surgeon: Malissa Hippo, MD;  Location: [facility];  Service: Endoscopy;  Laterality: N/A;  8:30 am  . Hot hemostasis  03/26/2011    Procedure: HOT HEMOSTASIS (ARGON PLASMA COAGULATION/BICAP);  Surgeon: Malissa Hippo, MD;  Location: [facility];  Service: Endoscopy;  Laterality: N/A;  . Stent in liver    . Esophagogastroduodenoscopy N/A 10/24/2012    Procedure: ESOPHAGOGASTRODUODENOSCOPY (EGD);  Surgeon: Malissa Hippo, MD;  Location: [facility];  Service: Endoscopy;  Laterality: N/A;  . Esophagogastroduodenoscopy N/A  11/22/2012    Procedure: ESOPHAGOGASTRODUODENOSCOPY (EGD);  Surgeon: Charna Elizabeth, MD;  Location: [facility] ENDOSCOPY;  Service: Endoscopy;  Laterality: N/A;  . Paracentesis    . Esophagogastroduodenoscopy N/A 12/21/2012    Procedure: ESOPHAGOGASTRODUODENOSCOPY (EGD);  Surgeon: Malissa Hippo, MD;  Location: [facility];  Service: Endoscopy;  Laterality: N/A;  250-moved to 155 Ann to notify pt  . Hot hemostasis N/A 12/21/2012    Procedure: HOT HEMOSTASIS (ARGON PLASMA COAGULATION/BICAP);  Surgeon: Malissa Hippo, MD;  Location: [facility];  Service: Endoscopy;  Laterality: N/A;    History reviewed. No pertinent family history. Social History:  reports that he quit smoking about 17 months ago. His smoking use included Cigarettes. He has a 30 pack-year smoking history. He quit smokeless tobacco use about 8 months ago. He reports that he does not drink alcohol or use illicit drugs.  Allergies:  Allergies  Allergen Reactions  . Tylenol (Acetaminophen) Other (See Comments)    cirrhosis  . Morphine And Related Itching    Medications Prior to Admission  Medication Sig Dispense Refill  . b complex-vitamin c-folic acid (NEPHRO-VITE) 0.8 MG TABS Take 1 tablet by mouth at bedtime.  30 tablet  0  . ciprofloxacin (CIPRO) 500 MG tablet Take 1 tablet (500 mg total) by mouth daily.  30 tablet  3  . esomeprazole (NEXIUM) 40 MG capsule Take 40 mg by mouth daily as needed (acid reflux).       . furosemide (  LASIX) 80 MG tablet Take 80 mg by mouth 2 (two) times daily.      [name] glipiZIDE (GLUCOTROL) 5 MG tablet Take 5 mg by mouth 2 (two) times daily as needed (sugar control).      . hydrocortisone valerate ointment (WESTCORT) 0.2 % Apply 1 application topically every Monday, Wednesday, and Friday. For numbing prior to dialysis      . lactulose (CHRONULAC) 10 GM/15ML solution Take 20 g by mouth daily as needed (for constipation).      [name] lanthanum (FOSRENOL) 1000 MG chewable tablet Chew 500-1,000 mg by mouth 4 (four)  times daily -  with meals and at bedtime. Takes 1/2 tab (500 mg) with snacks and 1 tab (1,000 mg) with meals      . megestrol (MEGACE) 40 MG tablet Take 1 tablet (40 mg total) by mouth 4 (four) times daily.  120 tablet  4  . ondansetron (ZOFRAN) 4 MG tablet Take 4 mg by mouth daily as needed for nausea.       . sucralfate (CARAFATE) 1 GM/10ML suspension Take 10 mLs (1 g total) by mouth 4 (four) times daily.  420 mL  0  . traMADol (ULTRAM) 50 MG tablet Take 50 mg by mouth 3 (three) times daily as needed for pain.         No results found for this or any previous visit (from the past 48 hour(s)). No results found.  ROS  Pulse 104, temperature 97.4 F (36.3 C), resp. rate 18, SpO2 96.00%. Physical Exam  Constitutional: He appears well-developed and well-nourished.  HENT:  Mouth/Throat: Oropharynx is clear and moist.  Eyes: Conjunctivae are normal. No scleral icterus.  Neck: No thyromegaly present.  Cardiovascular: Normal rate, regular rhythm and normal heart sounds.   No murmur heard. Respiratory: Effort normal and breath sounds normal.  GI:  Abdomen is full soft and nontender.  Musculoskeletal: He exhibits edema (trace edema around ankles).  Lymphadenopathy:    He has no cervical adenopathy.  Neurological: He is alert.  Asterixis absent  Skin: Skin is warm and dry.     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 EGD with APC therapy of GAVE.  REHMAN,NAJEEB U 01/25/2013, 12:42 PM

## 2013-01-25 NOTE — Op Note (Signed)
EGD PROCEDURE REPORT  PATIENT:  Timothy Chan  MR#:  [id number] Birthdate:  Aug 30, 1950, 62 y.o., male Endoscopist:  Dr. Malissa Hippo, MD Referred By:  Dr. Ignatius Specking, MD Procedure Date: 01/25/2013  Procedure:   EGD with APC of gastric antral vascular ectasia( GAVE).  Indications:  Patient is 62 year old Caucasian male with multiple medical problems including end-stage cirrhosis who has a recurrent GI bleed secondary to gastric antral vascular ectasia. He is returning for repeat therapy. Last EGD with APC therapy was on 12/21/2012.           Informed Consent:  The risks, benefits, alternatives & imponderables which include, but are not limited to, bleeding, infection, perforation, drug reaction and potential missed lesion have been reviewed.  The potential for biopsy, lesion removal, esophageal dilation, etc. have also been discussed.  Questions have been answered.  All parties agreeable.  Please see history & physical in medical record for more information.  Medications:  Demerol 50 mg IV Versed 6 mg IV Cetacaine spray topically for oropharyngeal anesthesia  Description of procedure:  The endoscope was introduced through the mouth and advanced to the second portion of the duodenum without difficulty or limitations. The mucosal surfaces were surveyed very carefully during advancement of the scope and upon withdrawal.  Findings:  Esophagus:  Mucosa of the proximal segment was normal. Multiple linear and focal ulcers noted involving the distal 15 cm esophagus on the GE junction. No esophageal varices at that identified on today's exam. GEJ:  43 cm Stomach:  Stomach was empty and distended very well with insufflation. Folds in the proximal stomach were normal. Examination of mucosa revealed snake skinning her most a patent as well as red spots of gastric body and fundus. Multiple telangiectasias there were noted at antrum more dense in the prepyloric region. No active bleeding noted this time.  No fundal varices identified. Duodenum:  Normal bulbar and post bulbar mucosa.  Therapeutic/Diagnostic Maneuvers Performed:  Multiple vascular lesions were ablated with argon plasma coagulator. Most of the lesions that were treated with located distally close to the pylorus lesions at pylorus were not treated.  Complications:  None  Impression: Ulcerative esophagitis which is new since last EGD of 12/21/2012. Portal gastropathy. Gastric antral vascular ectasia. Multiple lesions are ablated with argon plasma coagulator.  Recommendations:  Sucralfate 1 g a.c. and each bedtime for 2 weeks only. Nexium 40 mg by mouth every morning. Office visit in one month.  Najmo Pardue U  01/25/2013  1:16 PM  CC: Dr. Ignatius Specking., MD & Dr. Bonnetta Barry ref. provider found

## 2013-01-29 ENCOUNTER — Encounter (HOSPITAL_COMMUNITY): Payer: Self-pay | Admitting: Internal Medicine

## 2013-02-14 ENCOUNTER — Encounter (HOSPITAL_COMMUNITY): Payer: Self-pay | Admitting: *Deleted

## 2013-02-14 ENCOUNTER — Emergency Department (HOSPITAL_COMMUNITY): Payer: Medicare Other

## 2013-02-14 ENCOUNTER — Inpatient Hospital Stay (HOSPITAL_COMMUNITY)
Admission: EM | Admit: 2013-02-14 | Discharge: 2013-02-19 | DRG: 391 | Disposition: A | Discharge status: Home / Self Care | Payer: Medicare Other | Attending: Internal Medicine | Admitting: Internal Medicine

## 2013-02-14 DIAGNOSIS — R6510 Systemic inflammatory response syndrome (SIRS) of non-infectious origin without acute organ dysfunction: Secondary | ICD-10-CM

## 2013-02-14 DIAGNOSIS — R4182 Altered mental status, unspecified: Secondary | ICD-10-CM

## 2013-02-14 DIAGNOSIS — F1020 Alcohol dependence, uncomplicated: Secondary | ICD-10-CM | POA: Diagnosis present

## 2013-02-14 DIAGNOSIS — K922 Gastrointestinal hemorrhage, unspecified: Secondary | ICD-10-CM

## 2013-02-14 DIAGNOSIS — D696 Thrombocytopenia, unspecified: Secondary | ICD-10-CM

## 2013-02-14 DIAGNOSIS — E8809 Other disorders of plasma-protein metabolism, not elsewhere classified: Secondary | ICD-10-CM | POA: Diagnosis present

## 2013-02-14 DIAGNOSIS — E118 Type 2 diabetes mellitus with unspecified complications: Secondary | ICD-10-CM | POA: Diagnosis present

## 2013-02-14 DIAGNOSIS — D649 Anemia, unspecified: Secondary | ICD-10-CM

## 2013-02-14 DIAGNOSIS — D62 Acute posthemorrhagic anemia: Secondary | ICD-10-CM | POA: Diagnosis present

## 2013-02-14 DIAGNOSIS — D638 Anemia in other chronic diseases classified elsewhere: Secondary | ICD-10-CM | POA: Diagnosis present

## 2013-02-14 DIAGNOSIS — Z87891 Personal history of nicotine dependence: Secondary | ICD-10-CM

## 2013-02-14 DIAGNOSIS — K7290 Hepatic failure, unspecified without coma: Secondary | ICD-10-CM

## 2013-02-14 DIAGNOSIS — K219 Gastro-esophageal reflux disease without esophagitis: Secondary | ICD-10-CM | POA: Diagnosis present

## 2013-02-14 DIAGNOSIS — R630 Anorexia: Secondary | ICD-10-CM

## 2013-02-14 DIAGNOSIS — K7030 Alcoholic cirrhosis of liver without ascites: Secondary | ICD-10-CM | POA: Diagnosis present

## 2013-02-14 DIAGNOSIS — K7682 Hepatic encephalopathy: Secondary | ICD-10-CM | POA: Diagnosis present

## 2013-02-14 DIAGNOSIS — I8510 Secondary esophageal varices without bleeding: Secondary | ICD-10-CM | POA: Diagnosis present

## 2013-02-14 DIAGNOSIS — G629 Polyneuropathy, unspecified: Secondary | ICD-10-CM

## 2013-02-14 DIAGNOSIS — N186 End stage renal disease: Secondary | ICD-10-CM | POA: Diagnosis present

## 2013-02-14 DIAGNOSIS — R188 Other ascites: Secondary | ICD-10-CM | POA: Diagnosis present

## 2013-02-14 DIAGNOSIS — N19 Unspecified kidney failure: Secondary | ICD-10-CM

## 2013-02-14 DIAGNOSIS — E7220 Disorder of urea cycle metabolism, unspecified: Secondary | ICD-10-CM

## 2013-02-14 DIAGNOSIS — K709 Alcoholic liver disease, unspecified: Secondary | ICD-10-CM

## 2013-02-14 DIAGNOSIS — K319 Disease of stomach and duodenum, unspecified: Secondary | ICD-10-CM | POA: Diagnosis present

## 2013-02-14 DIAGNOSIS — R1013 Epigastric pain: Secondary | ICD-10-CM | POA: Diagnosis not present

## 2013-02-14 DIAGNOSIS — E440 Moderate protein-calorie malnutrition: Secondary | ICD-10-CM | POA: Insufficient documentation

## 2013-02-14 DIAGNOSIS — Z91158 Patient's noncompliance with renal dialysis for other reason: Secondary | ICD-10-CM

## 2013-02-14 DIAGNOSIS — K31819 Angiodysplasia of stomach and duodenum without bleeding: Principal | ICD-10-CM | POA: Diagnosis present

## 2013-02-14 DIAGNOSIS — E1165 Type 2 diabetes mellitus with hyperglycemia: Secondary | ICD-10-CM

## 2013-02-14 DIAGNOSIS — I120 Hypertensive chronic kidney disease with stage 5 chronic kidney disease or end stage renal disease: Secondary | ICD-10-CM | POA: Diagnosis present

## 2013-02-14 DIAGNOSIS — R109 Unspecified abdominal pain: Secondary | ICD-10-CM

## 2013-02-14 DIAGNOSIS — Z992 Dependence on renal dialysis: Secondary | ICD-10-CM | POA: Diagnosis present

## 2013-02-14 DIAGNOSIS — D500 Iron deficiency anemia secondary to blood loss (chronic): Secondary | ICD-10-CM | POA: Diagnosis present

## 2013-02-14 DIAGNOSIS — H3321 Serous retinal detachment, right eye: Secondary | ICD-10-CM

## 2013-02-14 DIAGNOSIS — Z9115 Patient's noncompliance with renal dialysis: Secondary | ICD-10-CM

## 2013-02-14 DIAGNOSIS — IMO0001 Reserved for inherently not codable concepts without codable children: Secondary | ICD-10-CM | POA: Diagnosis present

## 2013-02-14 LAB — CBC WITH DIFFERENTIAL/PLATELET
Basophils Absolute: 0.1 10*3/uL (ref 0.0–0.1)
Lymphs Abs: 2.3 10*3/uL (ref 0.7–4.0)
MCV: 92.5 fL (ref 78.0–100.0)
Monocytes Absolute: 0.6 10*3/uL (ref 0.1–1.0)
Monocytes Relative: 8 % (ref 3–12)
Platelets: 177 10*3/uL (ref 150–400)
RDW: 21.1 % — ABNORMAL HIGH (ref 11.5–15.5)
WBC: 8.1 10*3/uL (ref 4.0–10.5)

## 2013-02-14 LAB — BASIC METABOLIC PANEL
CO2: 18 mEq/L — ABNORMAL LOW (ref 19–32)
Calcium: 8.9 mg/dL (ref 8.4–10.5)
Creatinine, Ser: 12.68 mg/dL — ABNORMAL HIGH (ref 0.50–1.35)
GFR calc non Af Amer: 4 mL/min — ABNORMAL LOW (ref >90)
Sodium: 139 mEq/L (ref 135–145)

## 2013-02-14 MED ORDER — PANTOPRAZOLE SODIUM 40 MG IV SOLR: 40.0000 mg | Freq: Two times a day (BID) | INTRAVENOUS | Status: DC

## 2013-02-14 MED ORDER — SODIUM CHLORIDE 0.9 % IV SOLN
80.0000 mg | Freq: Once | INTRAVENOUS | Status: DC
  Filled 2013-02-14: qty 80

## 2013-02-14 MED ORDER — SODIUM CHLORIDE 0.9 % IV SOLN
8.0000 mg/h | INTRAVENOUS | Status: DC
  Administered 2013-02-14 – 2013-02-17 (×5): 8 mg/h via INTRAVENOUS
  Filled 2013-02-14 (×11): qty 80

## 2013-02-14 MED ORDER — FUROSEMIDE 10 MG/ML IJ SOLN: 20.0000 mg | Freq: Once | INTRAMUSCULAR | Status: DC

## 2013-02-14 MED ORDER — SODIUM CHLORIDE 0.9 % IV SOLN
8.0000 mg/h | INTRAVENOUS | Status: DC
  Filled 2013-02-14 (×5): qty 80

## 2013-02-14 MED ORDER — SODIUM CHLORIDE 0.9 % IV SOLN
250.0000 mL | INTRAVENOUS | Status: DC | PRN
  Administered 2013-02-14: 250 mL via INTRAVENOUS

## 2013-02-14 MED ORDER — PANTOPRAZOLE SODIUM 40 MG IV SOLR
INTRAVENOUS | Status: AC
  Filled 2013-02-14: qty 80

## 2013-02-14 MED ORDER — SODIUM CHLORIDE 0.9 % IJ SOLN
3.0000 mL | Freq: Two times a day (BID) | INTRAMUSCULAR | Status: DC
  Administered 2013-02-15 – 2013-02-16 (×2): 3 mL via INTRAVENOUS

## 2013-02-14 MED ORDER — PANTOPRAZOLE SODIUM 40 MG IV SOLR
INTRAVENOUS | Status: AC
  Filled 2013-02-14: qty 40

## 2013-02-14 MED ORDER — INSULIN ASPART 100 UNIT/ML ~~LOC~~ SOLN
0.0000 [IU] | SUBCUTANEOUS | Status: DC
  Administered 2013-02-15: 2 [IU] via SUBCUTANEOUS
  Administered 2013-02-15 (×2): 1 [IU] via SUBCUTANEOUS
  Administered 2013-02-15 – 2013-02-16 (×2): 2 [IU] via SUBCUTANEOUS
  Administered 2013-02-17: 3 [IU] via SUBCUTANEOUS
  Administered 2013-02-17: 2 [IU] via SUBCUTANEOUS

## 2013-02-14 MED ORDER — ONDANSETRON HCL 4 MG/2ML IJ SOLN
4.0000 mg | Freq: Four times a day (QID) | INTRAMUSCULAR | Status: DC | PRN
  Administered 2013-02-14 – 2013-02-18 (×5): 4 mg via INTRAVENOUS
  Filled 2013-02-14 (×5): qty 2

## 2013-02-14 MED ORDER — SODIUM CHLORIDE 0.9 % IJ SOLN: 3.0000 mL | INTRAMUSCULAR | Status: DC | PRN

## 2013-02-14 MED ORDER — PANTOPRAZOLE SODIUM 40 MG IV SOLR
40.0000 mg | Freq: Two times a day (BID) | INTRAVENOUS | Status: DC
  Administered 2013-02-14: 40 mg via INTRAVENOUS

## 2013-02-14 MED ORDER — ONDANSETRON HCL 4 MG PO TABS: 4.0000 mg | ORAL_TABLET | Freq: Four times a day (QID) | ORAL | Status: DC | PRN

## 2013-02-14 NOTE — ED Provider Notes (Signed)
CSN: [id number]     Arrival date & time 02/14/13  1749 History     First MD Initiated Contact with Patient 02/14/13 1821     Chief Complaint  Patient presents with  . Emesis  . Weakness   (Consider location/radiation/quality/duration/timing/severity/associated sxs/prior Treatment) HPI  Patient reports "I was kicked out of dialysis" about 3 weeks ago. He states he's able to drive to dialysis however they will not come in and help him get out of his vehicle any longer. He states about 3 PM this afternoon he vomited blood twice. He states he's had loss of appetite. He denies any current nausea. He states he has noted blood in his bowel movements for the past 3-4 days. He also complains of some abdominal pain in the lower epigastric region. He denies chest pain but does have dyspnea on exertion. Patient states he feels very weak. Patient did not mention that he was just seen in the emergency department about a month ago for bad gastroesophageal reflux symptoms. He also has a history of alcoholic liver disease and esophageal varices. He has had rectal bleeding in the past.   PCP Dr Sherril Croon Nephrologist Dr Kristian Covey  Past Medical History  Diagnosis Date  . Rectal bleeding   . Dialysis care   . GI bleed   . Hypertension   . Esophageal varices   . Alcoholic liver disease   . Hepatic encephalopathy   . Ascites   . Diabetes mellitus   . Detached retina   . Renal disorder   . Pneumonia    Past Surgical History  Procedure Laterality Date  . Esophagogastroduodenoscopy  12/31/2010    EGD APC THERAPY  . Esophagogastroduodenoscopy  05/31/2012E    GD APC ABLATION  . Small bowel givens  12/01/2010  . Colonoscopy  09/07/2010  . Esophagogastroduodenoscopy  09/05/2010    OUTLAW  . Lung surgery  6/11    [place]  . Esophagogastroduodenoscopy  03/26/2011    Procedure: ESOPHAGOGASTRODUODENOSCOPY (EGD);  Surgeon: Malissa Hippo, MD;  Location: [facility];  Service: Endoscopy;  Laterality:  N/A;  8:30 am  . Hot hemostasis  03/26/2011    Procedure: HOT HEMOSTASIS (ARGON PLASMA COAGULATION/BICAP);  Surgeon: Malissa Hippo, MD;  Location: [facility];  Service: Endoscopy;  Laterality: N/A;  . Stent in liver    . Esophagogastroduodenoscopy N/A 10/24/2012    Procedure: ESOPHAGOGASTRODUODENOSCOPY (EGD);  Surgeon: Malissa Hippo, MD;  Location: [facility];  Service: Endoscopy;  Laterality: N/A;  . Esophagogastroduodenoscopy N/A 11/22/2012    Procedure: ESOPHAGOGASTRODUODENOSCOPY (EGD);  Surgeon: Charna Elizabeth, MD;  Location: [facility];  Service: Endoscopy;  Laterality: N/A;  . Paracentesis    . Esophagogastroduodenoscopy N/A 12/21/2012    Procedure: ESOPHAGOGASTRODUODENOSCOPY (EGD);  Surgeon: Malissa Hippo, MD;  Location: [facility];  Service: Endoscopy;  Laterality: N/A;  250-moved to 155 Ann to notify pt  . Hot hemostasis N/A 12/21/2012    Procedure: HOT HEMOSTASIS (ARGON PLASMA COAGULATION/BICAP);  Surgeon: Malissa Hippo, MD;  Location: [facility];  Service: Endoscopy;  Laterality: N/A;  . Esophagogastroduodenoscopy N/A 01/25/2013    Procedure: ESOPHAGOGASTRODUODENOSCOPY (EGD);  Surgeon: Malissa Hippo, MD;  Location: [facility];  Service: Endoscopy;  Laterality: N/A;  100  . Hot hemostasis N/A 01/25/2013    Procedure: HOT HEMOSTASIS (ARGON PLASMA COAGULATION/BICAP);  Surgeon: Malissa Hippo, MD;  Location: [facility];  Service: Endoscopy;  Laterality: N/A;   History reviewed. No pertinent family history. History  Substance  Use Topics  . Smoking status: Former Smoker -- 1.00 packs/day for 30 years    Types: Cigarettes    Quit date: 08/11/2011  . Smokeless tobacco: Former Neurosurgeon    Quit date: 05/12/2012  . Alcohol Use: No     Comment: denies-quit 2000  lives at home Lives alone  Review of Systems  All other systems reviewed and are negative.    Allergies  Tylenol and Morphine and related  Home Medications   Current Outpatient Rx  Name  Route  Sig   Dispense  Refill  . b complex-vitamin c-folic acid (NEPHRO-VITE) 0.8 MG TABS   Oral   Take 1 tablet by mouth at bedtime.   30 tablet   0   . ciprofloxacin (CIPRO) 500 MG tablet   Oral   Take 1 tablet (500 mg total) by mouth daily.   30 tablet   3     Shail needs to be taking this medication daily   . esomeprazole (NEXIUM) 40 MG capsule   Oral   Take 1 capsule (40 mg total) by mouth daily before breakfast.   30 capsule   5   . furosemide (LASIX) 80 MG tablet   Oral   Take 80 mg by mouth 2 (two) times daily.         [name] glipiZIDE (GLUCOTROL) 5 MG tablet   Oral   Take 5 mg by mouth 2 (two) times daily as needed (sugar control).         . hydrocortisone valerate ointment (WESTCORT) 0.2 %   Topical   Apply 1 application topically every Monday, Wednesday, and Friday. For numbing prior to dialysis         . lactulose (CHRONULAC) 10 GM/15ML solution   Oral   Take 20 g by mouth daily as needed (for constipation).         [name] lanthanum (FOSRENOL) 1000 MG chewable tablet   Oral   Chew 500-1,000 mg by mouth 4 (four) times daily -  with meals and at bedtime. Takes 1/2 tab (500 mg) with snacks and 1 tab (1,000 mg) with meals         . megestrol (MEGACE) 40 MG tablet   Oral   Take 1 tablet (40 mg total) by mouth 4 (four) times daily.   120 tablet   4   . ondansetron (ZOFRAN) 4 MG tablet   Oral   Take 4 mg by mouth daily as needed for nausea.          . sucralfate (CARAFATE) 1 GM/10ML suspension   Oral   Take 10 mLs (1 g total) by mouth 4 (four) times daily.   420 mL   0   . traMADol (ULTRAM) 50 MG tablet   Oral   Take 50 mg by mouth 3 (three) times daily as needed for pain.           BP 141/61  Pulse 96  Temp(Src) 97.9 F (36.6 C) (Oral)  Resp 20  SpO2 100%  Vital signs normal   Physical Exam  Nursing note and vitals reviewed. Constitutional: He is oriented to person, place, and time.  Non-toxic appearance. He does not appear ill. No distress.  Thin  ill kept male with coffee-ground-type staining of his T-shirt  HENT:  Head: Normocephalic and atraumatic.  Right Ear: External ear normal.  Left Ear: External ear normal.  Nose: Nose normal. No mucosal edema or rhinorrhea.  Mouth/Throat: Oropharynx is clear and moist and mucous  membranes are normal. No dental abscesses or edematous.  Eyes: Conjunctivae and EOM are normal. Pupils are equal, round, and reactive to light.  Neck: Normal range of motion and full passive range of motion without pain. Neck supple.  Cardiovascular: Normal rate, regular rhythm and normal heart sounds.  Exam reveals no gallop and no friction rub.   No murmur heard. Pulmonary/Chest: Effort normal and breath sounds normal. No respiratory distress. He has no wheezes. He has no rhonchi. He has no rales. He exhibits no tenderness and no crepitus.  Abdominal: Soft. Normal appearance and bowel sounds are normal. He exhibits distension. There is no tenderness. There is no rebound and no guarding.  Musculoskeletal: Normal range of motion. He exhibits no edema and no tenderness.  Moves all extremities well.   Neurological: He is alert and oriented to person, place, and time. He has normal strength. No cranial nerve deficit.  Skin: Skin is warm, dry and intact. No rash noted. No erythema. There is pallor.  Psychiatric: His speech is normal and behavior is normal. His mood appears not anxious.  Flat affect    ED Course   Medications  pantoprazole (PROTONIX) 80 mg in sodium chloride 0.9 % 250 mL infusion (8 mg/hr Intravenous Rate/Dose Verify 02/14/13 2200)  insulin aspart (novoLOG) injection 0-9 Units (0 Units Subcutaneous Not Given 02/14/13 2130)  sodium chloride 0.9 % injection 3 mL (3 mLs Intravenous Not Given 02/14/13 2200)  sodium chloride 0.9 % injection 3 mL (3 mLs Intravenous Not Given 02/14/13 2200)  sodium chloride 0.9 % injection 3 mL (not administered)  0.9 %  sodium chloride infusion ( Intravenous Canceled Entry 02/14/13  2252)  ondansetron (ZOFRAN) tablet 4 mg (not administered)    Or  ondansetron (ZOFRAN) injection 4 mg (not administered)      Procedures (including critical care time)  Pt prepared to get blood transfusion.   19:57 Dr Barnie Del admit to step down team 1  Results for orders placed during the hospital encounter of 02/14/13  BASIC METABOLIC PANEL      Result Value Range   Sodium 139  135 - 145 mEq/L   Potassium 5.0  3.5 - 5.1 mEq/L   Chloride 105  96 - 112 mEq/L   CO2 18 (*) 19 - 32 mEq/L   Glucose, Bld 167 (*) 70 - 99 mg/dL   BUN 94 (*) 6 - 23 mg/dL   Creatinine, Ser 78.29 (*) 0.50 - 1.35 mg/dL   Calcium 8.9  8.4 - 56.2 mg/dL   GFR calc non Af Amer 4 (*) >90 mL/min   GFR calc Af Amer 4 (*) >90 mL/min  CBC WITH DIFFERENTIAL      Result Value Range   WBC 8.1  4.0 - 10.5 K/uL   RBC 2.01 (*) 4.22 - 5.81 MIL/uL   Hemoglobin 6.0 (*) 13.0 - 17.0 g/dL   HCT 13.0 (*) 86.5 - 78.4 %   MCV 92.5  78.0 - 100.0 fL   MCH 29.9  26.0 - 34.0 pg   MCHC 32.3  30.0 - 36.0 g/dL   RDW 69.6 (*) 29.5 - 28.4 %   Platelets 177  150 - 400 K/uL   Neutrophils Relative % 57  43 - 77 %   Lymphocytes Relative 28  12 - 46 %   Monocytes Relative 8  3 - 12 %   Eosinophils Relative 6 (*) 0 - 5 %   Basophils Relative 1  0 - 1 %   Neutro Abs  4.6  1.7 - 7.7 K/uL   Lymphs Abs 2.3  0.7 - 4.0 K/uL   Monocytes Absolute 0.6  0.1 - 1.0 K/uL   Eosinophils Absolute 0.5  0.0 - 0.7 K/uL   Basophils Absolute 0.1  0.0 - 0.1 K/uL   RBC Morphology POLYCHROMASIA PRESENT     WBC Morphology TOXIC GRANULATION    APTT      Result Value Range   aPTT 31  24 - 37 seconds  PROTIME-INR      Result Value Range   Prothrombin Time 15.5 (*) 11.6 - 15.2 seconds   INR 1.26  0.00 - 1.49  SAMPLE TO BLOOD BANK      Result Value Range   Blood Bank Specimen SAMPLE AVAILABLE FOR TESTING     Sample Expiration 02/17/2013    TYPE AND SCREEN      Result Value Range   ABO/RH(D) B NEG     Antibody Screen NEG     Sample Expiration  02/17/2013     Unit Number [id number]     Blood Component Type RED CELLS,LR     Unit division 00     Status of Unit ISSUED     Transfusion Status OK TO TRANSFUSE     Crossmatch Result Compatible     Unit Number [id number]     Blood Component Type RBC LR PHER2     Unit division 00     Status of Unit ALLOCATED     Transfusion Status OK TO TRANSFUSE     Crossmatch Result Compatible    PREPARE RBC (CROSSMATCH)      Result Value Range   Order Confirmation ORDER PROCESSED BY BLOOD BANK     Laboratory interpretation all normal except anemia, mildly elevated INR consistent with history of liver disease, renal failure   X-ray Chest Pa Or Ap  02/14/2013   *RADIOLOGY REPORT*  Clinical Data: Weakness  CHEST - 1 VIEW  Comparison: 01/15/2013  Findings: The cardiac shadow is stable.  The lungs are well-aerated bilaterally.  Some chronic changes are seen in the right lung base. The osseous structures are within normal limits.  IMPRESSION: No acute abnormality noted.   Original Report Authenticated By: Alcide Clever, M.D.    Date: 02/14/2013  Rate: 105  Rhythm: sinus tachycardia  QRS Axis: normal  Intervals: normal  ST/T Wave abnormalities: normal  Conduction Disutrbances:none  Narrative Interpretation:   Old EKG Reviewed: changes noted from 01/15/2013 HR was 60     1. Anemia   2. Alcoholic liver disease   3. Renal failure   4. GI bleeding   5. Acute blood loss anemia   6. Diabetes mellitus type 2, uncontrolled, with complications   7. ESRD on dialysis   8. Upper GI bleeding     Plan admission   Devoria Albe, MD, FACEP   CRITICAL CARE Performed by: Devoria Albe L Total critical care time: 31 min  Critical care time was exclusive of separately billable procedures and treating other patients. Critical care was necessary to treat or prevent imminent or life-threatening deterioration. Critical care was time spent personally by me on the following activities: development of treatment  plan with patient and/or surrogate as well as nursing, discussions with consultants, evaluation of patient's response to treatment, examination of patient, obtaining history from patient or surrogate, ordering and performing treatments and interventions, ordering and review of laboratory studies, ordering and review of radiographic studies, pulse oximetry and re-evaluation of patient's condition.    MDM  Ward Givens, MD 02/14/13 2311

## 2013-02-14 NOTE — ED Notes (Signed)
CRITICAL VALUE ALERT  Critical value received:  hemoglobin6.0  Date of notification:  02/14/2013  Time of notification:  1915  Critical value read back: yes  Nurse who received alert:  Bennetta Laos  MD notified (1st page):  knapp  Time of first page:  1915  MD notified (2nd page):  Time of second page:  Responding MD:  knapp  Time MD responded:  [phone]

## 2013-02-14 NOTE — ED Notes (Signed)
Reports n/v today and increased weakness.  Has not been dialyzed x 3 wks due to transportation reasons.  EMS gave Zofran 4mg  IV en route with some relief.

## 2013-02-14 NOTE — H&P (Signed)
[facility] History and Physical  Timothy Chan:[id number] DOB: [birth year] DOA: 02/14/2013   PCP: Ignatius Specking., MD  Specialists: Dr. Karilyn Cota, is his gastroenterologist  Chief Complaint: Black and coffee ground emesis  HPI: Timothy Chan is a 62 y.o. male with a past medical history of recurrent episodes of GI bleeding, alcoholic liver disease, end-stage renal disease on hemodialysis, diabetes, who was in his usual state of health about 4 PM today when he started having vomiting, which was black in color. And, then he had a few episodes of coffee-ground emesis. He hasn't had a bowel movement in 3 days. Denies any blood in the stool. Has had poor appetite and decreased oral intake for the last many days. He does get acid reflux occasionally. Has had upper abdominal discomfort and at times. Denies any fever, but has had chills. No syncopal episodes recently. Does get short of breath with exertion, but none currently. His last endoscopy was last month, which showed GAVE and no varices were noted. Hasn't had dialysis in about 3 weeks. Apparently, has some social issues with getting to a dialysis center.  Home Medications: Prior to Admission medications   Medication Sig Start Date End Date Taking? Authorizing Provider  b complex-vitamin c-folic acid (NEPHRO-VITE) 0.8 MG TABS Take 1 tablet by mouth at bedtime. 11/25/12  Yes Christiane Ha, MD  ciprofloxacin (CIPRO) 500 MG tablet Take 1 tablet (500 mg total) by mouth daily. 01/18/13  Yes Len Blalock, NP  esomeprazole (NEXIUM) 40 MG capsule Take 1 capsule (40 mg total) by mouth daily before breakfast. 01/25/13  Yes Malissa Hippo, MD  furosemide (LASIX) 80 MG tablet Take 80 mg by mouth 2 (two) times daily.   Yes Historical Provider, MD  glipiZIDE (GLUCOTROL) 5 MG tablet Take 5 mg by mouth 2 (two) times daily as needed (sugar control).   Yes Historical Provider, MD  hydrocortisone valerate ointment (WESTCORT) 0.2 % Apply 1 application topically  every Monday, Wednesday, and Friday. For numbing prior to dialysis   Yes Historical Provider, MD  lactulose (CHRONULAC) 10 GM/15ML solution Take 20 g by mouth daily as needed (for constipation). 11/25/12  Yes Christiane Ha, MD  lanthanum (FOSRENOL) 1000 MG chewable tablet Chew 500-1,000 mg by mouth 4 (four) times daily -  with meals and at bedtime. Takes 1/2 tab (500 mg) with snacks and 1 tab (1,000 mg) with meals 11/25/12  Yes Christiane Ha, MD  megestrol (MEGACE) 40 MG tablet Take 1 tablet (40 mg total) by mouth 4 (four) times daily. 01/18/13  Yes Len Blalock, NP  ondansetron (ZOFRAN) 4 MG tablet Take 4 mg by mouth daily as needed for nausea.    Yes Historical Provider, MD  sucralfate (CARAFATE) 1 GM/10ML suspension Take 10 mLs (1 g total) by mouth 4 (four) times daily. 01/15/13  Yes Flint Melter, MD  traMADol (ULTRAM) 50 MG tablet Take 50 mg by mouth 3 (three) times daily as needed for pain.    Yes Historical Provider, MD    Allergies:  Allergies  Allergen Reactions  . Tylenol (Acetaminophen) Other (See Comments)    cirrhosis  . Morphine And Related Itching    Past Medical History: Past Medical History  Diagnosis Date  . Rectal bleeding   . Dialysis care   . GI bleed   . Hypertension   . Esophageal varices   . Alcoholic liver disease   . Hepatic encephalopathy   . Ascites   . Diabetes mellitus   .  Detached retina   . Renal disorder   . Pneumonia     Past Surgical History  Procedure Laterality Date  . Esophagogastroduodenoscopy  12/31/2010    EGD [facility] THERAPY  . Esophagogastroduodenoscopy  05/31/2012E    GD [facility] ABLATION  . Small bowel givens  12/01/2010  . Colonoscopy  09/07/2010  . Esophagogastroduodenoscopy  09/05/2010    OUTLAW  . Lung surgery  6/11    [place]  . Esophagogastroduodenoscopy  03/26/2011    Procedure: ESOPHAGOGASTRODUODENOSCOPY (EGD);  Surgeon: Malissa Hippo, MD;  Location: [facility];  Service: Endoscopy;  Laterality: N/A;  8:30  am  . Hot hemostasis  03/26/2011    Procedure: HOT HEMOSTASIS (ARGON PLASMA COAGULATION/BICAP);  Surgeon: Malissa Hippo, MD;  Location: [facility];  Service: Endoscopy;  Laterality: N/A;  . Stent in liver    . Esophagogastroduodenoscopy N/A 10/24/2012    Procedure: ESOPHAGOGASTRODUODENOSCOPY (EGD);  Surgeon: Malissa Hippo, MD;  Location: [facility];  Service: Endoscopy;  Laterality: N/A;  . Esophagogastroduodenoscopy N/A 11/22/2012    Procedure: ESOPHAGOGASTRODUODENOSCOPY (EGD);  Surgeon: Charna Elizabeth, MD;  Location[facility];  Service: Endoscopy;  Laterality: N/A;  . Paracentesis    . Esophagogastroduodenoscopy N/A 12/21/2012    Procedure: ESOPHAGOGASTRODUODENOSCOPY (EGD);  Surgeon: Malissa Hippo, MD;  Location: [facility];  Service: Endoscopy;  Laterality: N/A;  250-moved to 155 Ann to notify pt  . Hot hemostasis N/A 12/21/2012    Procedure: HOT HEMOSTASIS (ARGON PLASMA COAGULATION/BICAP);  Surgeon: Malissa Hippo, MD;  [facility]: [facility];  Service: Endoscopy;  Laterality: N/A;  . Esophagogastroduodenoscopy N/A 01/25/2013    Procedure: ESOPHAGOGASTRODUODENOSCOPY (EGD);  Surgeon: Malissa Hippo, MD;  Location: [facility];  Service: Endoscopy;  Laterality: N/A;  100  . Hot hemostasis N/A 01/25/2013    Procedure: HOT HEMOSTASIS (ARGON PLASMA COAGULATION/BICAP);  Surgeon: Malissa Hippo, MD;  Location: [facility];  Service: Endoscopy;  Laterality: N/A;    Social History:  reports that he quit smoking about 18 months ago. His smoking use included Cigarettes. He has a 30 pack-year smoking history. He quit smokeless tobacco use about 9 months ago. He reports that he does not drink alcohol or use illicit drugs.  Living Situation: He lives by himself Activity Level: Gets around in a wheelchair   Family History: his mother had diabetes  Review of Systems - History obtained from the patient General ROS: positive for  - fatigue Psychological ROS: negative Ophthalmic ROS:  negative ENT ROS: negative Allergy and Immunology ROS: negative Hematological and Lymphatic ROS: negative Endocrine ROS: negative Respiratory ROS: no cough, shortness of breath, or wheezing Cardiovascular ROS: no chest pain or dyspnea on exertion Gastrointestinal ROS: as in hpi Genito-Urinary ROS: no dysuria, trouble voiding, or hematuria Musculoskeletal ROS: negative Neurological ROS: no TIA or stroke symptoms Dermatological ROS: negative  Physical Examination  Filed Vitals:   02/14/13 1841 02/14/13 2017  BP: 141/61 150/55  Pulse: 96 98  Temp: 97.9 F (36.6 C)   TempSrc: Oral   Resp: 20 18  SpO2: 100% 99%    General appearance: alert, cooperative, appears stated age and no distress Head: Normocephalic, without obvious abnormality, atraumatic Eyes: Pallor. no icterus. Throat: lips, mucosa, and tongue normal; teeth and gums normal Resp: clear to auscultation bilaterally Cardio: regular rate and rhythm, S1, S2 normal, no murmur, click, rub or gallop GI: Abdomen was soft. Minimal tenderness in the upper abdomen without any rebound, rigidity, or guarding. No masses, organomegaly. Bowel  sounds are present. Extremities: extremities normal, atraumatic, no cyanosis or edema Pulses: 2+ and symmetric Skin: Skin color, texture, turgor normal. No rashes or lesions Lymph nodes: Cervical, supraclavicular, and axillary nodes normal. Neurologic: Alert and oriented x3. No focal neurological deficits are present  Laboratory Data: Results for orders placed during the hospital encounter of 02/14/13 (from the past 48 hour(s))  SAMPLE TO BLOOD BANK     Status: None   Collection Time    02/14/13  6:55 PM      Result Value Range   Blood Bank Specimen SAMPLE AVAILABLE FOR TESTING     Sample Expiration 02/17/2013    TYPE AND SCREEN     Status: None   Collection Time    02/14/13  6:55 PM      Result Value Range   ABO/RH(D) B NEG     Antibody Screen NEG     Sample Expiration 02/17/2013      Unit Number [id number]     Blood Component Type RED CELLS,LR     Unit division 00     Status of Unit ALLOCATED     Transfusion Status OK TO TRANSFUSE     Crossmatch Result Compatible     Unit Number [id number]     Blood Component Type RBC LR PHER2     Unit division 00     Status of Unit ALLOCATED     Transfusion Status OK TO TRANSFUSE     Crossmatch Result Compatible    PREPARE RBC (CROSSMATCH)     Status: None   Collection Time    02/14/13  6:55 PM      Result Value Range   Order Confirmation ORDER PROCESSED BY BLOOD BANK    BASIC METABOLIC PANEL     Status: Abnormal   Collection Time    02/14/13  6:56 PM      Result Value Range   Sodium 139  135 - 145 mEq/L   Potassium 5.0  3.5 - 5.1 mEq/L   Chloride 105  96 - 112 mEq/L   CO2 18 (*) 19 - 32 mEq/L   Glucose, Bld 167 (*) 70 - 99 mg/dL   BUN 94 (*) 6 - 23 mg/dL   Creatinine, Ser 40.10 (*) 0.50 - 1.35 mg/dL   Calcium 8.9  8.4 - 27.2 mg/dL   GFR calc non Af Amer 4 (*) >90 mL/min   GFR calc Af Amer 4 (*) >90 mL/min   Comment:            The eGFR has been calculated     using the CKD EPI equation.     This calculation has not been     validated in all clinical     situations.     eGFR's persistently     <90 mL/min signify     possible Chronic Kidney Disease.  CBC WITH DIFFERENTIAL     Status: Abnormal   Collection Time    02/14/13  6:56 PM      Result Value Range   WBC 8.1  4.0 - 10.5 K/uL   RBC 2.01 (*) 4.22 - 5.81 MIL/uL   Hemoglobin 6.0 (*) 13.0 - 17.0 g/dL   Comment: RESULT REPEATED AND VERIFIED     CRITICAL RESULT CALLED TO, READ BACK BY AND VERIFIED WITH:     P. WATKINS AT 1917 ON 02/14/13 BY S. VANHOORNE   HCT 18.6 (*) 39.0 - 52.0 %   MCV 92.5  78.0 - 100.0 fL  MCH 29.9  26.0 - 34.0 pg   MCHC 32.3  30.0 - 36.0 g/dL   RDW 16.1 (*) 09.6 - 04.5 %   Platelets 177  150 - 400 K/uL   Neutrophils Relative % 57  43 - 77 %   Lymphocytes Relative 28  12 - 46 %   Monocytes Relative 8  3 - 12 %   Eosinophils  Relative 6 (*) 0 - 5 %   Basophils Relative 1  0 - 1 %   Neutro Abs 4.6  1.7 - 7.7 K/uL   Lymphs Abs 2.3  0.7 - 4.0 K/uL   Monocytes Absolute 0.6  0.1 - 1.0 K/uL   Eosinophils Absolute 0.5  0.0 - 0.7 K/uL   Basophils Absolute 0.1  0.0 - 0.1 K/uL   RBC Morphology POLYCHROMASIA PRESENT     Comment: BURR CELLS   WBC Morphology TOXIC GRANULATION    APTT     Status: None   Collection Time    02/14/13  6:56 PM      Result Value Range   aPTT 31  24 - 37 seconds   Comment: RESULT CALLED TO, READ BACK BY AND VERIFIED WITH:     NORMAN,B ON 02/14/13 AT 1955 BY VANHOORNE,S     CORRECTED ON 08/06 AT 1958: PREVIOUSLY REPORTED AS 31, CORRECTED ON 08/06 AT 1951: PREVIOUSLY REPORTED AS 35  PROTIME-INR     Status: Abnormal   Collection Time    02/14/13  6:56 PM      Result Value Range   Prothrombin Time 15.5 (*) 11.6 - 15.2 seconds   Comment: RCRV     NORMAN,B ON 02/14/13 AT 1955 BY VANHOORNE,S     CORRECTED ON 08/06 AT 1958: PREVIOUSLY REPORTED AS 15.5, CORRECTED ON 08/06 AT 1951: PREVIOUSLY REPORTED AS 18.1   INR 1.26  0.00 - 1.49   Comment: RCRV     NORMAN,B ON 02/14/13 AT 1955 BY VANHOORNE,S     RESULT CALLED TO, READ BACK BY AND VERIFIED WITH:     NORMAN,B ON 02/14/13 AT 1955 BY VANHOORNE,S     CORRECTED ON 08/06 AT 1958: PREVIOUSLY REPORTED AS 1.26, CORRECTED ON 08/06 AT 1951: PREVIOUSLY REPORTED AS 1.54    Radiology Reports: X-ray Chest Pa Or Ap  02/14/2013   *RADIOLOGY REPORT*  Clinical Data: Weakness  CHEST - 1 VIEW  Comparison: 01/15/2013  Findings: The cardiac shadow is stable.  The lungs are well-aerated bilaterally.  Some chronic changes are seen in the right lung base. The osseous structures are within normal limits.  IMPRESSION: No acute abnormality noted.   Original Report Authenticated By: Alcide Clever, M.D.    Electrocardiogram: Sinus tachycardia at 105 beats per minute. Left axis deviation. No definite Q waves. No concerning ST or T-wave changes are noted.  Problem List  Principal  Problem:   Upper GI bleeding Active Problems:   Diabetes mellitus type 2, uncontrolled, with complications   Acute blood loss anemia   GAVE (gastric antral vascular ectasia)   ESRD on dialysis   Assessment: This is a 62 year old, Caucasian male, who presents with coffee-ground emesis. He has significantly low hemoglobin of 6. He has a history of chronic upper GI bleeding. He has a history of GAVE. He. He had a EGD, just a few weeks ago. Please see report in the computer. He is hemodynamically stable. He has not been dialyzed in 3 weeks.  Plan: #1 upper GI bleed: He'll monitor in step down unit. He's hemodynamically  stable and does not have frank hematemesis. GI will be consulted to see him in the morning. He'll be kept n.p.o. past midnight. He'll be kept on a proton pump inhibitor infusion. Since no varices were noted on recent EGD, no need for Octreotide infusion.  #2 acute blood loss anemia: He'll be transfused at least one unit. Because he has not been dialyzed in 3 weeks he is at high risk for fluid overload, so, we will refrain from further transfusions unless he bleeds again.  #3 history of end-stage renal disease on hemodialysis: Consult nephrology to see him. He was last dialyzed 3 weeks ago. He does not have any evidence of fluid overload. Creatinine is significantly high compared to his baseline. Consult social worker to address his access to dialysis.  #4 history of, diabetes, type II: Put him on sliding scale insulin coverage. Check HbA1c.  #5 alcoholic liver disease: Check LFTs  DVT Prophylaxis: SCDs Code Status: Full code Family Communication: Discussed with the patient  Disposition Plan: Admit to step down   Further management decisions will depend on results of further testing and patient's response to treatment.  [facility]  [facility] Pager [phone]  If 7PM-7AM, please contact night-coverage [email or web address] Password [facility]  02/14/2013, 9:25 PM

## 2013-02-15 DIAGNOSIS — K31811 Angiodysplasia of stomach and duodenum with bleeding: Secondary | ICD-10-CM

## 2013-02-15 DIAGNOSIS — K7030 Alcoholic cirrhosis of liver without ascites: Secondary | ICD-10-CM

## 2013-02-15 DIAGNOSIS — D649 Anemia, unspecified: Secondary | ICD-10-CM

## 2013-02-15 DIAGNOSIS — K769 Liver disease, unspecified: Secondary | ICD-10-CM

## 2013-02-15 DIAGNOSIS — K921 Melena: Secondary | ICD-10-CM

## 2013-02-15 LAB — COMPREHENSIVE METABOLIC PANEL
AST: 41 U/L — ABNORMAL HIGH (ref 0–37)
Albumin: 1.7 g/dL — ABNORMAL LOW (ref 3.5–5.2)
Alkaline Phosphatase: 89 U/L (ref 39–117)
BUN: 92 mg/dL — ABNORMAL HIGH (ref 6–23)
Chloride: 110 mEq/L (ref 96–112)
Potassium: 4.6 mEq/L (ref 3.5–5.1)
Total Bilirubin: 1.2 mg/dL (ref 0.3–1.2)
Total Protein: 5.7 g/dL — ABNORMAL LOW (ref 6.0–8.3)

## 2013-02-15 LAB — GLUCOSE, CAPILLARY
Glucose-Capillary: 106 mg/dL — ABNORMAL HIGH (ref 70–99)
Glucose-Capillary: 112 mg/dL — ABNORMAL HIGH (ref 70–99)
Glucose-Capillary: 134 mg/dL — ABNORMAL HIGH (ref 70–99)
Glucose-Capillary: 152 mg/dL — ABNORMAL HIGH (ref 70–99)
Glucose-Capillary: 181 mg/dL — ABNORMAL HIGH (ref 70–99)

## 2013-02-15 LAB — CBC
HCT: 19.5 % — ABNORMAL LOW (ref 39.0–52.0)
MCH: 29.9 pg (ref 26.0–34.0)
MCV: 92.4 fL (ref 78.0–100.0)
Platelets: 149 10*3/uL — ABNORMAL LOW (ref 150–400)
RBC: 2.11 MIL/uL — ABNORMAL LOW (ref 4.22–5.81)
RDW: 19.3 % — ABNORMAL HIGH (ref 11.5–15.5)
WBC: 6.4 10*3/uL (ref 4.0–10.5)

## 2013-02-15 MED ORDER — LIDOCAINE-PRILOCAINE 2.5-2.5 % EX CREA
1.0000 "application " | TOPICAL_CREAM | CUTANEOUS | Status: DC | PRN
  Administered 2013-02-15: 1 via TOPICAL
  Filled 2013-02-15 (×2): qty 5

## 2013-02-15 MED ORDER — LIDOCAINE HCL (PF) 1 % IJ SOLN: 5.0000 mL | INTRAMUSCULAR | Status: DC | PRN

## 2013-02-15 MED ORDER — PENTAFLUOROPROP-TETRAFLUOROETH EX AERO
1.0000 "application " | INHALATION_SPRAY | CUTANEOUS | Status: DC | PRN
  Filled 2013-02-15: qty 103.5

## 2013-02-15 MED ORDER — METOCLOPRAMIDE HCL 5 MG/ML IJ SOLN
10.0000 mg | Freq: Four times a day (QID) | INTRAMUSCULAR | Status: AC
  Administered 2013-02-15 – 2013-02-16 (×3): 10 mg via INTRAVENOUS
  Filled 2013-02-15 (×3): qty 2

## 2013-02-15 MED ORDER — HEPARIN SODIUM (PORCINE) 1000 UNIT/ML DIALYSIS
1000.0000 [IU] | INTRAMUSCULAR | Status: DC | PRN
  Filled 2013-02-15: qty 1

## 2013-02-15 MED ORDER — SODIUM CHLORIDE 0.9 % IV SOLN: 100.0000 mL | INTRAVENOUS | Status: DC | PRN

## 2013-02-15 MED ORDER — NEPRO/CARBSTEADY PO LIQD: 237.0000 mL | ORAL | Status: DC | PRN

## 2013-02-15 MED ORDER — ALTEPLASE 2 MG IJ SOLR: 2.0000 mg | Freq: Once | INTRAMUSCULAR | Status: AC | PRN

## 2013-02-15 NOTE — Progress Notes (Signed)
[facility] chart review completed.  

## 2013-02-15 NOTE — Consult Note (Addendum)
Reason for Consult:hematemesis  Referring Physician: Hospitalist services  Timothy CARMICHEAL is an 62 y.o. male.  HPI: Admitted thru the ED yesterday. He tells me he began to vomit around 5pm yesterday.  He said the vomitus was black and also looked like it had coffee grounds.  He has not eaten in 2-3 days. He says he vomited about a half gallon or of black vomitus.  He denies prior hx of hematemesis.  He started having black stools about a week ago.  Since admission he has received one unit of PRBCs. Hx of gastric antral vascular ectasia (GAVE).  Admission hemoglobin 6.0. 6.3 after receiving one unit of blood.  He underwent and EGD 01/2013 with GAVE. Found to have ulcerative esophagitis which was new since his last EGD in June of this year. Portal gastropathy. Gastric antral vascular ectasia. He had multiple lesions ablated with argon plasma coagulator.   Hx of end stage liver disease and ESRD: He has not received dialysis in 3 weeks. He tells me he drives himself to dialysis but the staff will not help him in. INR/Prothrombin Time     Past Medical History  Diagnosis Date  . Rectal bleeding   . Dialysis care   . GI bleed   . Hypertension   . Esophageal varices   . Alcoholic liver disease   . Hepatic encephalopathy   . Ascites   . Diabetes mellitus   . Detached retina   . Renal disorder   . Pneumonia     Past Surgical History  Procedure Laterality Date  . Esophagogastroduodenoscopy  12/31/2010    EGD APC THERAPY  . Esophagogastroduodenoscopy  05/31/2012E    GD APC ABLATION  . Small bowel givens  12/01/2010  . Colonoscopy  09/07/2010  . Esophagogastroduodenoscopy  09/05/2010    OUTLAW  . Lung surgery  6/11    [place]  . Esophagogastroduodenoscopy  03/26/2011    Procedure: ESOPHAGOGASTRODUODENOSCOPY (EGD);  Surgeon: Malissa Hippo, MD;  [facility];  Service: Endoscopy;  Laterality: N/A;  8:30 am  . Hot hemostasis  03/26/2011    Procedure: HOT HEMOSTASIS (ARGON  PLASMA COAGULATION/BICAP);  Surgeon: Malissa Hippo, MD;  [facility];  Service: Endoscopy;  Laterality: N/A;  . Stent in liver    . Esophagogastroduodenoscopy N/A 10/24/2012    Procedure: ESOPHAGOGASTRODUODENOSCOPY (EGD);  Surgeon: Malissa Hippo, MD;  [facility];  Service: Endoscopy;  Laterality: N/A;  . Esophagogastroduodenoscopy N/A 11/22/2012    Procedure: ESOPHAGOGASTRODUODENOSCOPY (EGD);  Surgeon: Charna Elizabeth, MD;  Location: [facility] ENDOSCOPY;  Service: Endoscopy;  Laterality: N/A;  . Paracentesis    . Esophagogastroduodenoscopy N/A 12/21/2012    Procedure: ESOPHAGOGASTRODUODENOSCOPY (EGD);  Surgeon: Malissa Hippo, MD;  [facility];  Service: Endoscopy;  Laterality: N/A;  250-moved to 155 Ann to notify pt  . Hot hemostasis N/A 12/21/2012    Procedure: HOT HEMOSTASIS (ARGON PLASMA COAGULATION/BICAP);  Surgeon: Malissa Hippo, MD;  [facility];  Service: Endoscopy;  Laterality: N/A;  . Esophagogastroduodenoscopy N/A 01/25/2013    Procedure: ESOPHAGOGASTRODUODENOSCOPY (EGD);  Surgeon: Malissa Hippo, MD;  [facility];  Service: Endoscopy;  Laterality: N/A;  100  . Hot hemostasis N/A 01/25/2013    Procedure: HOT HEMOSTASIS (ARGON PLASMA COAGULATION/BICAP);  Surgeon: Malissa Hippo, MD;  [facility];  Service: Endoscopy;  Laterality: N/A;    History reviewed. No pertinent family history.  Social History:  reports that he quit smoking about  18 months ago. His smoking use included Cigarettes. He has a 30 pack-year smoking history. He quit smokeless tobacco use about 9 months ago. He reports that he does not drink alcohol or use illicit drugs.  Allergies:  Allergies  Allergen Reactions  . Tylenol (Acetaminophen) Other (See Comments)    cirrhosis  . Morphine And Related Itching    Medications: I have reviewed the patient's current medications.  Results for orders placed during the hospital encounter of 02/14/13 (from  the past 48 hour(s))  SAMPLE TO BLOOD BANK     Status: None   Collection Time    02/14/13  6:55 PM      Result Value Range   Blood Bank Specimen SAMPLE AVAILABLE FOR TESTING     Sample Expiration 02/17/2013    TYPE AND SCREEN     Status: None   Collection Time    02/14/13  6:55 PM      Result Value Range   ABO/RH(D) B NEG     Antibody Screen NEG     Sample Expiration 02/17/2013     Unit Number [id number]     Blood Component Type RED CELLS,LR     Unit division 00     Status of Unit ISSUED,FINAL     Transfusion Status OK TO TRANSFUSE     Crossmatch Result Compatible     Unit Number [id number]     Blood Component Type RBC LR PHER2     Unit division 00     Status of Unit ALLOCATED     Transfusion Status OK TO TRANSFUSE     Crossmatch Result Compatible    PREPARE RBC (CROSSMATCH)     Status: None   Collection Time    02/14/13  6:55 PM      Result Value Range   Order Confirmation ORDER PROCESSED BY BLOOD BANK    BASIC METABOLIC PANEL     Status: Abnormal   Collection Time    02/14/13  6:56 PM      Result Value Range   Sodium 139  135 - 145 mEq/L   Potassium 5.0  3.5 - 5.1 mEq/L   Chloride 105  96 - 112 mEq/L   CO2 18 (*) 19 - 32 mEq/L   Glucose, Bld 167 (*) 70 - 99 mg/dL   BUN 94 (*) 6 - 23 mg/dL   Creatinine, Ser 03.47 (*) 0.50 - 1.35 mg/dL   Calcium 8.9  8.4 - 42.5 mg/dL   GFR calc non Af Amer 4 (*) >90 mL/min   GFR calc Af Amer 4 (*) >90 mL/min   Comment:            The eGFR has been calculated     using the CKD EPI equation.     This calculation has not been     validated in all clinical     situations.     eGFR's persistently     <90 mL/min signify     possible Chronic Kidney Disease.  CBC WITH DIFFERENTIAL     Status: Abnormal   Collection Time    02/14/13  6:56 PM      Result Value Range   WBC 8.1  4.0 - 10.5 K/uL   RBC 2.01 (*) 4.22 - 5.81 MIL/uL   Hemoglobin 6.0 (*) 13.0 - 17.0 g/dL   Comment: RESULT REPEATED AND VERIFIED     CRITICAL RESULT  CALLED TO, READ BACK BY AND VERIFIED WITH:     P. Corine Shelter  AT 1917 ON 02/14/13 BY S. VANHOORNE   HCT 18.6 (*) 39.0 - 52.0 %   MCV 92.5  78.0 - 100.0 fL   MCH 29.9  26.0 - 34.0 pg   MCHC 32.3  30.0 - 36.0 g/dL   RDW 14.7 (*) 82.9 - 56.2 %   Platelets 177  150 - 400 K/uL   Neutrophils Relative % 57  43 - 77 %   Lymphocytes Relative 28  12 - 46 %   Monocytes Relative 8  3 - 12 %   Eosinophils Relative 6 (*) 0 - 5 %   Basophils Relative 1  0 - 1 %   Neutro Abs 4.6  1.7 - 7.7 K/uL   Lymphs Abs 2.3  0.7 - 4.0 K/uL   Monocytes Absolute 0.6  0.1 - 1.0 K/uL   Eosinophils Absolute 0.5  0.0 - 0.7 K/uL   Basophils Absolute 0.1  0.0 - 0.1 K/uL   RBC Morphology POLYCHROMASIA PRESENT     Comment: BURR CELLS   WBC Morphology TOXIC GRANULATION    APTT     Status: None   Collection Time    02/14/13  6:56 PM      Result Value Range   aPTT 31  24 - 37 seconds   Comment: RESULT CALLED TO, READ BACK BY AND VERIFIED WITH:     NORMAN,B ON 02/14/13 AT 1955 BY VANHOORNE,S     CORRECTED ON 08/06 AT 1958: PREVIOUSLY REPORTED AS 31, CORRECTED ON 08/06 AT 1951: PREVIOUSLY REPORTED AS 35  PROTIME-INR     Status: Abnormal   Collection Time    02/14/13  6:56 PM      Result Value Range   Prothrombin Time 15.5 (*) 11.6 - 15.2 seconds   Comment: RCRV     NORMAN,B ON 02/14/13 AT 1955 BY VANHOORNE,S     CORRECTED ON 08/06 AT 1958: PREVIOUSLY REPORTED AS 15.5, CORRECTED ON 08/06 AT 1951: PREVIOUSLY REPORTED AS 18.1   INR 1.26  0.00 - 1.49   Comment: RCRV     NORMAN,B ON 02/14/13 AT 1955 BY VANHOORNE,S     RESULT CALLED TO, READ BACK BY AND VERIFIED WITH:     NORMAN,B ON 02/14/13 AT 1955 BY VANHOORNE,S     CORRECTED ON 08/06 AT 1958: PREVIOUSLY REPORTED AS 1.26, CORRECTED ON 08/06 AT 1951: PREVIOUSLY REPORTED AS 1.54  MRSA PCR SCREENING     Status: None   Collection Time    02/14/13  9:41 PM      Result Value Range   MRSA by PCR NEGATIVE  NEGATIVE   Comment:            The GeneXpert MRSA Assay (FDA     approved for  NASAL specimens     only), is one component of a     comprehensive MRSA colonization     surveillance program. It is not     intended to diagnose MRSA     infection nor to guide or     monitor treatment for     MRSA infections.  GLUCOSE, CAPILLARY     Status: Abnormal   Collection Time    02/15/13 12:00 AM      Result Value Range   Glucose-Capillary 152 (*) 70 - 99 mg/dL   Comment 1 Documented in Chart     Comment 2 Notify RN    GLUCOSE, CAPILLARY     Status: Abnormal   Collection Time    02/15/13  3:58  AM      Result Value Range   Glucose-Capillary 134 (*) 70 - 99 mg/dL   Comment 1 Documented in Chart     Comment 2 Notify RN    COMPREHENSIVE METABOLIC PANEL     Status: Abnormal   Collection Time    02/15/13  4:43 AM      Result Value Range   Sodium 140  135 - 145 mEq/L   Potassium 4.6  3.5 - 5.1 mEq/L   Chloride 110  96 - 112 mEq/L   CO2 17 (*) 19 - 32 mEq/L   Glucose, Bld 149 (*) 70 - 99 mg/dL   BUN 92 (*) 6 - 23 mg/dL   Creatinine, Ser 16.10 (*) 0.50 - 1.35 mg/dL   Calcium 8.8  8.4 - 96.0 mg/dL   Total Protein 5.7 (*) 6.0 - 8.3 g/dL   Albumin 1.7 (*) 3.5 - 5.2 g/dL   AST 41 (*) 0 - 37 U/L   ALT 13  0 - 53 U/L   Alkaline Phosphatase 89  39 - 117 U/L   Total Bilirubin 1.2  0.3 - 1.2 mg/dL   GFR calc non Af Amer 4 (*) >90 mL/min   GFR calc Af Amer 5 (*) >90 mL/min   Comment:            The eGFR has been calculated     using the CKD EPI equation.     This calculation has not been     validated in all clinical     situations.     eGFR's persistently     <90 mL/min signify     possible Chronic Kidney Disease.  CBC     Status: Abnormal   Collection Time    02/15/13  4:43 AM      Result Value Range   WBC 6.4  4.0 - 10.5 K/uL   RBC 2.11 (*) 4.22 - 5.81 MIL/uL   Hemoglobin 6.3 (*) 13.0 - 17.0 g/dL   Comment: RESULT REPEATED AND VERIFIED     CRITICAL RESULT CALLED TO, READ BACK BY AND VERIFIED WITH:     BREWER DOROTHY RN ON [id number] AT 0545 BY RESSEGGER R   HCT 19.5  (*) 39.0 - 52.0 %   MCV 92.4  78.0 - 100.0 fL   MCH 29.9  26.0 - 34.0 pg   MCHC 32.3  30.0 - 36.0 g/dL   RDW 11.9 (*) 14.7 - 82.9 %   Platelets 149 (*) 150 - 400 K/uL  GLUCOSE, CAPILLARY     Status: Abnormal   Collection Time    02/15/13  7:21 AM      Result Value Range   Glucose-Capillary 134 (*) 70 - 99 mg/dL   Comment 1 Documented in Chart     Comment 2 Notify RN      X-ray Chest Pa Or Ap  02/14/2013   *RADIOLOGY REPORT*  Clinical Data: Weakness  CHEST - 1 VIEW  Comparison: 01/15/2013  Findings: The cardiac shadow is stable.  The lungs are well-aerated bilaterally.  Some chronic changes are seen in the right lung base. The osseous structures are within normal limits.  IMPRESSION: No acute abnormality noted.   Original Report Authenticated By: Alcide Clever, M.D.    ROS Blood pressure 148/60, pulse 98, temperature 98 F (36.7 C), temperature source Oral, resp. rate 26, height 6\' 3"  (1.905 m), weight 205 lb 14.6 oz (93.4 kg), SpO2 99.00%. Physical Exam Alert and oriented. Skin warm and dry.  Oral mucosa is moist.   . Sclera anicteric, conjunctivae is pink. Thyroid not enlarged. No cervical lymphadenopathy. Lungs clear. Heart regular rate and rhythm.  Abdomen is soft. Bowel sounds are positive. Unable to palpate liver. Abdomen is not tense.No abdominal masses felt. No tenderness.  No edema to lower extremities.    Assessment/Plan: Upper GI bleed probably from GAVE. No hx of esophageal varices. (Recent EGD in July did not reveal esophageal varices) I will discuss with Dr. Karilyn Cota. Patient will need EGD.  SETZER,TERRI W 02/15/2013, 9:03 AM    GI attending note; Patient interviewed and examined. Patient well known to me from multiple previous evaluations. 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 He also has end-stage renal disease on hemodialysis but missed dialysis for 3 weeks. He is requiring intermittent abdominal tap for symptomatic relief.  He also has history of recurrent GI bleed secondary to gastric antral vascular ectasia. Os EGD with APC therapy was on 01/25/2013. He now presents with nausea vomiting and hematemesis. He has received 2 units of PRBCs. Suspect he may be bleeding from gastric antral vascular ectasia or from Mallory-Weiss tear. Nausea vomiting possibly resulting from uremia. Will proceed with EGD in a.m. therapeutic intention. Metoclopromide. 10 mg IV x3 doses. Will allow full liquids this evening.

## 2013-02-15 NOTE — Progress Notes (Addendum)
INITIAL NUTRITION ASSESSMENT  DOCUMENTATION CODES Per approved criteria  -Non-severe (moderate) malnutrition in the context of chronic illness   INTERVENTION: Follow for diet advancement and nutrition care progression  NUTRITION DIAGNOSIS: Inadequate oral intake related to altered GI function as evidenced by upper GI bleed and reported anorexia prior to admission significant, unplanned wt loss.  Goal: Pt to meet >/= 90% of their estimated nutrition needs   Monitor:  Diet advancement, Po intake, labs and wt trends  Reason for Assessment: Malnutrition Screen Score = 2  62 y.o. male   Patient Active Problem List   Diagnosis Date Noted  . Upper GI bleeding 02/14/2013  . ESRD on dialysis 02/14/2013  . Anorexia 01/18/2013  . 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 Thrombocytopenia, unspecified 10/31/2012  . SIRS (systemic inflammatory response syndrome) 10/26/2012  . Abdominal pain 10/26/2012  . Peritonitis associated with peritoneal dialysis 10/26/2012  . GI bleed 10/24/2012  . ESRD on peritoneal dialysis 10/20/2012  . Acute blood loss anemia 10/20/2012  . Hyperammonemia 10/20/2012  . Altered mental status 10/20/2012  . Alcoholic cirrhosis/remote TIPS 04/10/2012  . Hepatic encephalopathy 04/10/2012  . Diabetes mellitus type 2, uncontrolled, with complications 04/10/2012  . 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 CKD (chronic kidney disease) stage V requiring chronic dialysis 04/10/2012  . Peripheral neuropathy 04/10/2012    ASSESSMENT: Episode of coffee-ground emesis. Currently NPO. GI consult pending. Denies vomiting since last night. Critical Hgb 6.3. Pt has hx of ETOH related liver dz, ESRD with HD and DM. Hx also includes anorexia and current wt reflects significant, unintentional wt loss of 14#, 6% x 30 days. He is receiving and appetite stimulant (started 1 month ago per pt). He follows Low Sodium diet at home. He shops and prepares his own food.according  to diet hx he drinks diet Mtn Dew and 1 cup of milk daily and eats a sandwich. Does not take nutrition supplements such as Nepro due to cost.   Pt meets criteria for non-severe malnutrition in the context of chronic illness given his unplanned wt loss and inadequate energy intake </= 75% for >/= 1 month.   Height: Ht Readings from Last 1 Encounters:  02/14/13 6\' 3"  (1.905 m)    Weight: Wt Readings from Last 1 Encounters:  02/14/13 205 lb 14.6 oz (93.4 kg)    Ideal Body Weight: 196# (89 kg)  % Ideal Body Weight: 105%  Wt Readings from Last 10 Encounters:  02/14/13 205 lb 14.6 oz (93.4 kg)  01/18/13 210 lb (95.255 kg)  01/15/13 220 lb (99.791 kg)  01/05/13 228 lb (103.42 kg)  12/19/12 230 lb (104.327 kg)  11/25/12 226 lb 6.6 oz (102.7 kg)  11/25/12 226 lb 6.6 oz (102.7 kg)  11/02/12 227 lb 8.2 oz (103.2 kg)  11/02/12 227 lb 8.2 oz (103.2 kg)  10/24/12 226 lb (102.513 kg)    Usual Body Weight: 225-230#  % Usual Body Weight: 92%  BMI:  Body mass index is 25.74 kg/(m^2).overweight  Estimated Nutritional Needs: Kcal: 2400-2700 Protein: 110-125 gr Fluid: 1000 ml plus urine output  Skin: diabetic ulcers toe and foot black scabbed area  Diet Order: NPO  EDUCATION NEEDS: -Education needs addressed   Intake/Output Summary (Last 24 hours) at 02/15/13 0745 Last data filed at 02/15/13 0600  Gross per 24 hour  Intake    635 ml  Output    200 ml  Net    435 ml    Last BM: 02/11/13  Labs:   Recent  Labs Lab 02/14/13 1856 02/15/13 0443  NA 139 140  K 5.0 4.6  CL 105 110  CO2 18* 17*  BUN 94* 92*  CREATININE 12.68* 12.05*  CALCIUM 8.9 8.8  GLUCOSE 167* 149*    CBG (last 3)   Recent Labs  02/15/13 02/15/13 0358  GLUCAP 152* 134*    Scheduled Meds: . insulin aspart  0-9 Units Subcutaneous Q4H  . sodium chloride  3 mL Intravenous Q12H  . sodium chloride  3 mL Intravenous Q12H    Continuous Infusions: . pantoprozole (PROTONIX) infusion 8 mg/hr  (02/15/13 0600)    Past Medical History  Diagnosis Date  . Rectal bleeding   . Dialysis care   . GI bleed   . Hypertension   . Esophageal varices   . Alcoholic liver disease   . Hepatic encephalopathy   . Ascites   . Diabetes mellitus   . Detached retina   . Renal disorder   . Pneumonia     Past Surgical History  Procedure Laterality Date  . Esophagogastroduodenoscopy  12/31/2010    EGD APC THERAPY  . Esophagogastroduodenoscopy  05/31/2012E    GD APC ABLATION  . Small bowel givens  12/01/2010  . Colonoscopy  09/07/2010  . Esophagogastroduodenoscopy  09/05/2010    OUTLAW  . Lung surgery  6/11    [place]  . Esophagogastroduodenoscopy  03/26/2011    Procedure: ESOPHAGOGASTRODUODENOSCOPY (EGD);  Surgeon: Malissa Hippo, MD;  [facility];  Service: Endoscopy;  Laterality: N/A;  8:30 am  . Hot hemostasis  03/26/2011    Procedure: HOT HEMOSTASIS (ARGON PLASMA COAGULATION/BICAP);  Surgeon: Malissa Hippo, MD;  [facility];  Service: Endoscopy;  Laterality: N/A;  . Stent in liver    . Esophagogastroduodenoscopy N/A 10/24/2012    Procedure: ESOPHAGOGASTRODUODENOSCOPY (EGD);  Surgeon: Malissa Hippo, MD;  [facility];  Service: Endoscopy;  Laterality: N/A;  . Esophagogastroduodenoscopy N/A 11/22/2012    Procedure: ESOPHAGOGASTRODUODENOSCOPY (EGD);  Surgeon: Charna Elizabeth, MD;  Location: [facility] ENDOSCOPY;  Service: Endoscopy;  Laterality: N/A;  . Paracentesis    . Esophagogastroduodenoscopy N/A 12/21/2012    Procedure: ESOPHAGOGASTRODUODENOSCOPY (EGD);  Surgeon: Malissa Hippo, MD;  [facility];  Service: Endoscopy;  Laterality: N/A;  250-moved to 155 Ann to notify pt  . Hot hemostasis N/A 12/21/2012    Procedure: HOT HEMOSTASIS (ARGON PLASMA COAGULATION/BICAP);  Surgeon: Malissa Hippo, MD;  [facility];  Service: Endoscopy;  Laterality: N/A;  . Esophagogastroduodenoscopy N/A 01/25/2013    Procedure:  ESOPHAGOGASTRODUODENOSCOPY (EGD);  Surgeon: Malissa Hippo, MD;  [facility];  Service: Endoscopy;  Laterality: N/A;  100  . Hot hemostasis N/A 01/25/2013    Procedure: HOT HEMOSTASIS (ARGON PLASMA COAGULATION/BICAP);  Surgeon: Malissa Hippo, MD;  [facility];  Service: Endoscopy;  Laterality: N/A;    Royann Shivers MS,RD,LDN,CSG Office: [phone] Pager: [phone]

## 2013-02-15 NOTE — Progress Notes (Addendum)
JI FELDNER AVW:[id number] DOB: 1951/05/07 DOA: 02/14/2013 PCP: Ignatius Specking., MD   Subjective: This man was admitted with coffee-ground vomitus. His hemoglobin has been decreased. He has received 1 unit of blood transfusion. He is hemodynamically stable. He has not had dialysis for approximately 3 weeks. He says he is unable to find transport to go to dialysis.           Physical Exam: Blood pressure 148/60, pulse 98, temperature 98 F (36.7 C), temperature source Oral, resp. rate 26, height 6\' 3"  (1.905 m), weight 93.4 kg (205 lb 14.6 oz), SpO2 99.00%. He looks very pale. His hemoglobin is stable. Heart sounds are present in sinus rhythm. Abdomen is soft, distended but not tender. He probably does have some ascites. Heart sounds are present without murmurs or added sounds. He is alert and orientated. There is no evidence of hepatic encephalopathy at this point.   Investigations:  Recent Results (from the past 240 hour(s))  MRSA PCR SCREENING     Status: None   Collection Time    02/14/13  9:41 PM      Result Value Range Status   MRSA by PCR NEGATIVE  NEGATIVE Final   Comment:            The GeneXpert MRSA Assay (FDA     approved for NASAL specimens     only), is one component of a     comprehensive MRSA colonization     surveillance program. It is not     intended to diagnose MRSA     infection nor to guide or     monitor treatment for     MRSA infections.     Basic Metabolic Panel:  Recent Labs  82/95/62 1856 02/15/13 0443  NA 139 140  K 5.0 4.6  CL 105 110  CO2 18* 17*  GLUCOSE 167* 149*  BUN 94* 92*  CREATININE 12.68* 12.05*  CALCIUM 8.9 8.8   Liver Function Tests:  Recent Labs  02/15/13 0443  AST 41*  ALT 13  ALKPHOS 89  BILITOT 1.2  PROT 5.7*  ALBUMIN 1.7*     CBC:  Recent Labs  02/14/13 1856 02/15/13 0443  WBC 8.1 6.4  NEUTROABS 4.6  --   HGB 6.0* 6.3*  HCT 18.6* 19.5*  MCV 92.5 92.4  PLT 177 149*    X-ray Chest Pa Or  Ap  02/14/2013   *RADIOLOGY REPORT*  Clinical Data: Weakness  CHEST - 1 VIEW  Comparison: 01/15/2013  Findings: The cardiac shadow is stable.  The lungs are well-aerated bilaterally.  Some chronic changes are seen in the right lung base. The osseous structures are within normal limits.  IMPRESSION: No acute abnormality noted.   Original Report Authenticated By: Alcide Clever, M.D.      Medications: I have reviewed the patient's current medications.  Impression: 1. Upper GI bleed, status post 1 unit blood transfusion. Hemoglobin still low at 6.3. 2. End-stage renal disease on hemodialysis, will need hemodialysis today. 3. End stage liver disease secondary to alcoholism. 4. History of gastric antral vascular ectasia, status post APC treatment. 5. Type 2 diabetes mellitus.     Plan: 1. Give further one unit of blood transfusion slowly. Continue with PPI infusion. 2. Nephrology consultation for hemodialysis today. 3. Gastroenterology consultation, will likely need repeat EGD.  Consultants:  Nephrology consultation pending.  Gastroenterology consultation pending.   Procedures:  None.   Antibiotics:  None.  Code Status: Full code.  Family Communication: Discuss plan with patient at the bedside.   Disposition Plan: Home when medically stable.  Time spent: 20 minutes.   LOS: 1 day   Wilson Singer Pager [phone]  02/15/2013, 8:44 AM

## 2013-02-15 NOTE — Care Management Note (Signed)
    Page 1 of 2   02/19/2013     1:31:33 PM   CARE MANAGEMENT NOTE 02/19/2013  Patient:  Timothy Chan, Timothy Chan   Account Number:  [id number]  Date Initiated:  02/15/2013  Documentation initiated by:  Timothy Chan  Subjective/Objective Assessment:   Pt admitted from home with gi bleeding. Pt lives alone and will return home at discharge. Pt has cane, walker, BSC, w/c for home use. Pt drives himself to [facility] dialysis three times a week. Pts wife is in [place] and his son that lives in     Action/Plan:   town works 3 jobs. Pt admits to being noncompliant with dialysis due to the fact that the dialysis nurse will not help him in and out of his car at the center. Pt stated that [facility] cost too much and he cannot afford it. Pt denies any HH nee.   Anticipated DC Date:  02/19/2013   Anticipated DC Plan:  HOME W HOME HEALTH SERVICES      DC Planning Services  CM consult      Choice offered to / List presented to:          [facility] arranged  HH-1 RN  HH-10 DISEASE MANAGEMENT  HH-2 PT      HH agency  [facility]   Status of service:  Completed, signed off Medicare Important Message given?   (If response is "NO", the following Medicare IM given date fields will be blank) Date Medicare IM given:   Date Additional Medicare IM given:    Discharge Disposition:  HOME W HOME HEALTH SERVICES  Per UR Regulation:    If discussed at Long Length of Stay Meetings, dates discussed:    Comments:  02/19/13 Timothy Hiltunen RN BNS CM Pt agreeable to [facility] RN and PT. Called Dr. Sherril Croon at [facility] IM regarding motorized wheelchair. Hospital F/U appt made and will get the paperwork started for [facility].  02/15/13 1510 Timothy Queen, RN BSN CM CM will call [facility] and CSW at dialysis center to see if something could be worked out and pt will become more compliant with dialysis.

## 2013-02-15 NOTE — Progress Notes (Signed)
Critical Hgb 6.3 text paged to Dr. Rito Ehrlich, will hold off on blood at this time do not want to fluid overload patient.

## 2013-02-15 NOTE — Consult Note (Signed)
Reason for Consult: End-stage renal disease Referring Physician: Dr Bing Ree is an 62 y.o. male.  HPI: He is a patient who has history of her liver cirrhosis, end-stage renal disease on maintenance to dialysis presently came with history of her hemoptysis and the coffee-ground vomitus. Patient has previous history of GI bleeding. Patient also says that his appetite is poor and he has some nausea but no vomiting. Patient very noncompliant with his dialysis for different reasons. Also the time he comes once a week. Patient has missed his dialysis for the last 2 weeks. Presently his BUN and creatinine still high.  Past Medical History  Diagnosis Date  . Rectal bleeding   . Dialysis care   . GI bleed   . Hypertension   . Esophageal varices   . Alcoholic liver disease   . Hepatic encephalopathy   . Ascites   . Diabetes mellitus   . Detached retina   . Renal disorder   . Pneumonia     Past Surgical History  Procedure Laterality Date  . Esophagogastroduodenoscopy  12/31/2010    EGD [facility] THERAPY  . Esophagogastroduodenoscopy  05/31/2012E    GD [facility] ABLATION  . Small bowel givens  12/01/2010  . Colonoscopy  09/07/2010  . Esophagogastroduodenoscopy  09/05/2010    OUTLAW  . Lung surgery  6/11    [place]  . Esophagogastroduodenoscopy  03/26/2011    Procedure: ESOPHAGOGASTRODUODENOSCOPY (EGD);  Surgeon: Malissa Hippo, MD;  [facility];  Service: Endoscopy;  Laterality: N/A;  8:30 am  . Hot hemostasis  03/26/2011    Procedure: HOT HEMOSTASIS (ARGON PLASMA COAGULATION/BICAP);  Surgeon: Malissa Hippo, MD;  [facility];  Service: Endoscopy;  Laterality: N/A;  . Stent in liver    . Esophagogastroduodenoscopy N/A 10/24/2012    Procedure: ESOPHAGOGASTRODUODENOSCOPY (EGD);  Surgeon: Malissa Hippo, MD;  [facility];  Service: Endoscopy;  Laterality: N/A;  . Esophagogastroduodenoscopy N/A 11/22/2012    Procedure: ESOPHAGOGASTRODUODENOSCOPY  (EGD);  Surgeon: Charna Elizabeth, MD;  Location: [facility] ENDOSCOPY;  Service: Endoscopy;  Laterality: N/A;  . Paracentesis    . Esophagogastroduodenoscopy N/A 12/21/2012    Procedure: ESOPHAGOGASTRODUODENOSCOPY (EGD);  Surgeon: Malissa Hippo, MD;  [facility];  Service: Endoscopy;  Laterality: N/A;  250-moved to 155 Ann to notify pt  . Hot hemostasis N/A 12/21/2012    Procedure: HOT HEMOSTASIS (ARGON PLASMA COAGULATION/BICAP);  Surgeon: Malissa Hippo, MD;  [facility];  Service: Endoscopy;  Laterality: N/A;  . Esophagogastroduodenoscopy N/A 01/25/2013    Procedure: ESOPHAGOGASTRODUODENOSCOPY (EGD);  Surgeon: Malissa Hippo, MD;  [facility];  Service: Endoscopy;  Laterality: N/A;  100  . Hot hemostasis N/A 01/25/2013    Procedure: HOT HEMOSTASIS (ARGON PLASMA COAGULATION/BICAP);  Surgeon: Malissa Hippo, MD;  [facility];  Service: Endoscopy;  Laterality: N/A;    History reviewed. No pertinent family history.  Social History:  reports that he quit smoking about 18 months ago. His smoking use included Cigarettes. He has a 30 pack-year smoking history. He quit smokeless tobacco use about 9 months ago. He reports that he does not drink alcohol or use illicit drugs.  Allergies:  Allergies  Allergen Reactions  . Tylenol (Acetaminophen) Other (See Comments)    cirrhosis  . Morphine And Related Itching    Medications: I have reviewed the patient's current medications.  Results for orders placed during the hospital encounter of 02/14/13 (from the past 48  hour(s))  SAMPLE TO BLOOD BANK     Status: None   Collection Time    02/14/13  6:55 PM      Result Value Range   Blood Bank Specimen SAMPLE AVAILABLE FOR TESTING     Sample Expiration 02/17/2013    TYPE AND SCREEN     Status: None   Collection Time    02/14/13  6:55 PM      Result Value Range   ABO/RH(D) B NEG     Antibody Screen NEG     Sample Expiration 02/17/2013     Unit Number  [id number]     Blood Component Type RED CELLS,LR     Unit division 00     Status of Unit ISSUED,FINAL     Transfusion Status OK TO TRANSFUSE     Crossmatch Result Compatible     Unit Number [id number]     Blood Component Type RBC LR PHER2     Unit division 00     Status of Unit ALLOCATED     Transfusion Status OK TO TRANSFUSE     Crossmatch Result Compatible    PREPARE RBC (CROSSMATCH)     Status: None   Collection Time    02/14/13  6:55 PM      Result Value Range   Order Confirmation ORDER PROCESSED BY BLOOD BANK    BASIC METABOLIC PANEL     Status: Abnormal   Collection Time    02/14/13  6:56 PM      Result Value Range   Sodium 139  135 - 145 mEq/L   Potassium 5.0  3.5 - 5.1 mEq/L   Chloride 105  96 - 112 mEq/L   CO2 18 (*) 19 - 32 mEq/L   Glucose, Bld 167 (*) 70 - 99 mg/dL   BUN 94 (*) 6 - 23 mg/dL   Creatinine, Ser 44.03 (*) 0.50 - 1.35 mg/dL   Calcium 8.9  8.4 - 47.4 mg/dL   GFR calc non Af Amer 4 (*) >90 mL/min   GFR calc Af Amer 4 (*) >90 mL/min   Comment:            The eGFR has been calculated     using the CKD EPI equation.     This calculation has not been     validated in all clinical     situations.     eGFR's persistently     <90 mL/min signify     possible Chronic Kidney Disease.  CBC WITH DIFFERENTIAL     Status: Abnormal   Collection Time    02/14/13  6:56 PM      Result Value Range   WBC 8.1  4.0 - 10.5 K/uL   RBC 2.01 (*) 4.22 - 5.81 MIL/uL   Hemoglobin 6.0 (*) 13.0 - 17.0 g/dL   Comment: RESULT REPEATED AND VERIFIED     CRITICAL RESULT CALLED TO, READ BACK BY AND VERIFIED WITH:     P. WATKINS AT 1917 ON 02/14/13 BY S. VANHOORNE   HCT 18.6 (*) 39.0 - 52.0 %   MCV 92.5  78.0 - 100.0 fL   MCH 29.9  26.0 - 34.0 pg   MCHC 32.3  30.0 - 36.0 g/dL   RDW 25.9 (*) 56.3 - 87.5 %   Platelets 177  150 - 400 K/uL   Neutrophils Relative % 57  43 - 77 %   Lymphocytes Relative 28  12 - 46 %   Monocytes Relative 8  3 - 12 %   Eosinophils Relative 6 (*)  0 - 5 %   Basophils Relative 1  0 - 1 %   Neutro Abs 4.6  1.7 - 7.7 K/uL   Lymphs Abs 2.3  0.7 - 4.0 K/uL   Monocytes Absolute 0.6  0.1 - 1.0 K/uL   Eosinophils Absolute 0.5  0.0 - 0.7 K/uL   Basophils Absolute 0.1  0.0 - 0.1 K/uL   RBC Morphology POLYCHROMASIA PRESENT     Comment: BURR CELLS   WBC Morphology TOXIC GRANULATION    APTT     Status: None   Collection Time    02/14/13  6:56 PM      Result Value Range   aPTT 31  24 - 37 seconds   Comment: RESULT CALLED TO, READ BACK BY AND VERIFIED WITH:     NORMAN,B ON 02/14/13 AT 1955 BY VANHOORNE,S     CORRECTED ON 08/06 AT 1958: PREVIOUSLY REPORTED AS 31, CORRECTED ON 08/06 AT 1951: PREVIOUSLY REPORTED AS 35  PROTIME-INR     Status: Abnormal   Collection Time    02/14/13  6:56 PM      Result Value Range   Prothrombin Time 15.5 (*) 11.6 - 15.2 seconds   Comment: RCRV     NORMAN,B ON 02/14/13 AT 1955 BY VANHOORNE,S     CORRECTED ON 08/06 AT 1958: PREVIOUSLY REPORTED AS 15.5, CORRECTED ON 08/06 AT 1951: PREVIOUSLY REPORTED AS 18.1   INR 1.26  0.00 - 1.49   Comment: RCRV     NORMAN,B ON 02/14/13 AT 1955 BY VANHOORNE,S     RESULT CALLED TO, READ BACK BY AND VERIFIED WITH:     NORMAN,B ON 02/14/13 AT 1955 BY VANHOORNE,S     CORRECTED ON 08/06 AT 1958: PREVIOUSLY REPORTED AS 1.26, CORRECTED ON 08/06 AT 1951: PREVIOUSLY REPORTED AS 1.54  MRSA PCR SCREENING     Status: None   Collection Time    02/14/13  9:41 PM      Result Value Range   MRSA by PCR NEGATIVE  NEGATIVE   Comment:            The GeneXpert MRSA Assay (FDA     approved for NASAL specimens     only), is one component of a     comprehensive MRSA colonization     surveillance program. It is not     intended to diagnose MRSA     infection nor to guide or     monitor treatment for     MRSA infections.  GLUCOSE, CAPILLARY     Status: Abnormal   Collection Time    02/15/13 12:00 AM      Result Value Range   Glucose-Capillary 152 (*) 70 - 99 mg/dL   Comment 1 Documented in  Chart     Comment 2 Notify RN    GLUCOSE, CAPILLARY     Status: Abnormal   Collection Time    02/15/13  3:58 AM      Result Value Range   Glucose-Capillary 134 (*) 70 - 99 mg/dL   Comment 1 Documented in Chart     Comment 2 Notify RN    COMPREHENSIVE METABOLIC PANEL     Status: Abnormal   Collection Time    02/15/13  4:43 AM      Result Value Range   Sodium 140  135 - 145 mEq/L   Potassium 4.6  3.5 - 5.1 mEq/L   Chloride 110  96 - 112 mEq/L   CO2 17 (*) 19 - 32 mEq/L   Glucose, Bld 149 (*) 70 - 99 mg/dL   BUN 92 (*) 6 - 23 mg/dL   Creatinine, Ser 16.10 (*) 0.50 - 1.35 mg/dL   Calcium 8.8  8.4 - 96.0 mg/dL   Total Protein 5.7 (*) 6.0 - 8.3 g/dL   Albumin 1.7 (*) 3.5 - 5.2 g/dL   AST 41 (*) 0 - 37 U/L   ALT 13  0 - 53 U/L   Alkaline Phosphatase 89  39 - 117 U/L   Total Bilirubin 1.2  0.3 - 1.2 mg/dL   GFR calc non Af Amer 4 (*) >90 mL/min   GFR calc Af Amer 5 (*) >90 mL/min   Comment:            The eGFR has been calculated     using the CKD EPI equation.     This calculation has not been     validated in all clinical     situations.     eGFR's persistently     <90 mL/min signify     possible Chronic Kidney Disease.  CBC     Status: Abnormal   Collection Time    02/15/13  4:43 AM      Result Value Range   WBC 6.4  4.0 - 10.5 K/uL   RBC 2.11 (*) 4.22 - 5.81 MIL/uL   Hemoglobin 6.3 (*) 13.0 - 17.0 g/dL   Comment: RESULT REPEATED AND VERIFIED     CRITICAL RESULT CALLED TO, READ BACK BY AND VERIFIED WITH:     BREWER DOROTHY RN ON [id number] AT 0545 BY RESSEGGER R   HCT 19.5 (*) 39.0 - 52.0 %   MCV 92.4  78.0 - 100.0 fL   MCH 29.9  26.0 - 34.0 pg   MCHC 32.3  30.0 - 36.0 g/dL   RDW 11.9 (*) 14.7 - 82.9 %   Platelets 149 (*) 150 - 400 K/uL  GLUCOSE, CAPILLARY     Status: Abnormal   Collection Time    02/15/13  7:21 AM      Result Value Range   Glucose-Capillary 134 (*) 70 - 99 mg/dL   Comment 1 Documented in Chart     Comment 2 Notify RN      X-ray Chest Pa Or  Ap  02/14/2013   *RADIOLOGY REPORT*  Clinical Data: Weakness  CHEST - 1 VIEW  Comparison: 01/15/2013  Findings: The cardiac shadow is stable.  The lungs are well-aerated bilaterally.  Some chronic changes are seen in the right lung base. The osseous structures are within normal limits.  IMPRESSION: No acute abnormality noted.   Original Report Authenticated By: Alcide Clever, M.D.    Review of Systems  Respiratory: Positive for shortness of breath.   Cardiovascular: Negative for leg swelling.  Gastrointestinal: Positive for nausea and abdominal pain. Negative for diarrhea and blood in stool.       He has coffee ground vomitus   Blood pressure 148/60, pulse 98, temperature 98.3 F (36.8 C), temperature source Oral, resp. rate 26, height 6\' 3"  (1.905 m), weight 93.4 kg (205 lb 14.6 oz), SpO2 99.00%. Physical Exam  Constitutional: He is oriented to person, place, and time.  ematiated  Eyes: No scleral icterus.  Neck: No JVD present.  Cardiovascular: Normal rate.   Respiratory: No respiratory distress. He has wheezes. He has no rales.  GI: He exhibits distension. There is no tenderness.  Musculoskeletal:  He exhibits no edema.  Neurological: He is alert and oriented to person, place, and time.    Assessment/Plan: Problem #1 end-stage renal disease his BUN and creatinine is high. Presently patient seems to have uremic sign and symptoms. Problem #2 history of upper  GI bleeding possibly a compression of peptic ulcer disease and uremia. His hemoglobin and hematocrit is very low. Problem #3 hepatic encephalopathy recently patient seems to be doing reasonably good. Problem #4 status post failed peritoneal dialysis Problem #5 history of diabetes Problem #6 metabolic bone disease presently his phosphorus is not available. Problem #7 Severe  hypoalbuminemia at this moment multifactorial. Problem #8 non-compliance with his dialysis. Patient says that he doesn't have transportation as his families are   Working and unable to help him. Patient does want to go to nursing home or other places because she doesn't like it and also does want to lose his many and house. Any attempts have been made as outpatient however since patient is not cooperating up to this point unable to provide him with any help. Plan: We'll make arrangements for patient to get dialysis today We'll check a CBC, basic metabolic panel and phosphorus in the morning. Plan: We'll make arrangements for patient to get dialysis today.  we'll check his CBC, basic metabolic panel and phosphorus in the morning.  Cuinn Westerhold S 02/15/2013, 9:54 AM

## 2013-02-15 NOTE — Procedures (Signed)
   HEMODIALYSIS TREATMENT NOTE:  3.5 hour heparin-free dialysis completed via left forearm AVF (15g needles / antegrade).  Tx initiation delayed due to patient's request for lidocaine cream x45 minutes.  GOAL MET:  Tolerated removal of 1956cc with 20 minutes of interrupted ultrafiltration time due to asymptomatic hypotension.  Received one unit PRBCs with dialysis.  All blood was reinfused.  Hemostasis was achieved within 15 minutes.  Report given to Kathyrn Sheriff, RN.  Hulon Ferron L. Paula Busenbark, RN, CDN

## 2013-02-16 ENCOUNTER — Encounter (HOSPITAL_COMMUNITY): Payer: Self-pay | Admitting: *Deleted

## 2013-02-16 ENCOUNTER — Inpatient Hospital Stay (HOSPITAL_COMMUNITY): Payer: Medicare Other

## 2013-02-16 ENCOUNTER — Encounter (HOSPITAL_COMMUNITY)
Admission: EM | Disposition: A | Discharge status: Home / Self Care | Payer: Self-pay | Source: Home / Self Care | Attending: Internal Medicine

## 2013-02-16 DIAGNOSIS — K766 Portal hypertension: Secondary | ICD-10-CM

## 2013-02-16 DIAGNOSIS — E440 Moderate protein-calorie malnutrition: Secondary | ICD-10-CM | POA: Insufficient documentation

## 2013-02-16 HISTORY — PX: ESOPHAGOGASTRODUODENOSCOPY: SHX5428

## 2013-02-16 LAB — GLUCOSE, CAPILLARY
Glucose-Capillary: 100 mg/dL — ABNORMAL HIGH (ref 70–99)
Glucose-Capillary: 123 mg/dL — ABNORMAL HIGH (ref 70–99)
Glucose-Capillary: 103 mg/dL — ABNORMAL HIGH (ref 70–99)

## 2013-02-16 LAB — CBC
MCHC: 33.2 g/dL (ref 30.0–36.0)
MCV: 92.4 fL (ref 78.0–100.0)
Platelets: 118 10*3/uL — ABNORMAL LOW (ref 150–400)
RDW: 19.5 % — ABNORMAL HIGH (ref 11.5–15.5)
WBC: 6.6 10*3/uL (ref 4.0–10.5)

## 2013-02-16 LAB — BASIC METABOLIC PANEL
Chloride: 104 mEq/L (ref 96–112)
Creatinine, Ser: 6.92 mg/dL — ABNORMAL HIGH (ref 0.50–1.35)
GFR calc Af Amer: 9 mL/min — ABNORMAL LOW (ref >90)
GFR calc non Af Amer: 8 mL/min — ABNORMAL LOW (ref >90)

## 2013-02-16 LAB — HEMOGLOBIN A1C
Hgb A1c MFr Bld: 4.6 % (ref <5.7)
Mean Plasma Glucose: 85 mg/dL (ref <117)

## 2013-02-16 SURGERY — EGD (ESOPHAGOGASTRODUODENOSCOPY)
Anesthesia: Moderate Sedation

## 2013-02-16 MED ORDER — MEPERIDINE HCL 50 MG/ML IJ SOLN
INTRAMUSCULAR | Status: AC
  Filled 2013-02-16: qty 1

## 2013-02-16 MED ORDER — MIDAZOLAM HCL 5 MG/5ML IJ SOLN
INTRAMUSCULAR | Status: AC
  Filled 2013-02-16: qty 10

## 2013-02-16 MED ORDER — STERILE WATER FOR IRRIGATION IR SOLN
Status: DC | PRN
  Administered 2013-02-16: 15:00:00

## 2013-02-16 MED ORDER — DEXTROSE 5 % IV SOLN
1.0000 g | Freq: Once | INTRAVENOUS | Status: AC
  Administered 2013-02-16: 1 g via INTRAVENOUS
  Filled 2013-02-16: qty 10

## 2013-02-16 MED ORDER — SODIUM CHLORIDE 0.9 % IV SOLN
INTRAVENOUS | Status: DC
  Administered 2013-02-16: 17:00:00 via INTRAVENOUS

## 2013-02-16 MED ORDER — BUTAMBEN-TETRACAINE-BENZOCAINE 2-2-14 % EX AERO
INHALATION_SPRAY | CUTANEOUS | Status: DC | PRN
  Administered 2013-02-16: 2 via TOPICAL

## 2013-02-16 MED ORDER — SODIUM CHLORIDE 0.9 % IJ SOLN
10.0000 mL | Freq: Two times a day (BID) | INTRAMUSCULAR | Status: DC
  Administered 2013-02-17 (×2): 10 mL
  Administered 2013-02-18: 20 mL
  Administered 2013-02-18: 40 mL
  Administered 2013-02-19: 10 mL

## 2013-02-16 MED ORDER — SODIUM CHLORIDE 0.9 % IJ SOLN: 10.0000 mL | INTRAMUSCULAR | Status: DC | PRN

## 2013-02-16 MED ORDER — MEPERIDINE HCL 25 MG/ML IJ SOLN
INTRAMUSCULAR | Status: DC | PRN
  Administered 2013-02-16 (×2): 25 mg via INTRAVENOUS

## 2013-02-16 MED ORDER — MIDAZOLAM HCL 5 MG/5ML IJ SOLN
INTRAMUSCULAR | Status: DC | PRN
  Administered 2013-02-16 (×3): 2 mg via INTRAVENOUS

## 2013-02-16 NOTE — Clinical Social Work Note (Signed)
CSW spoke w [facility] re dialysis transportation for patient.  Patient is on waiting list for subsidized transport to dialysis, when spot is available [facility] can assist.  [facility] estimates their private pay rate for transport from [place] to [place] to be approx $20/day.  They are willing to transport patient to dialysis for that rate.  CSW will discuss w patient.  Santa Genera, LCSW Clinical Social Worker [phone])

## 2013-02-16 NOTE — Progress Notes (Addendum)
Timothy Chan:[id number] DOB: Mar 23, 1951 DOA: 02/14/2013 PCP: Ignatius Specking., MD   Subjective: This man was admitted with coffee-ground vomitus. His hemoglobin has been decreased. He has received 1 unit of blood transfusion. He did have dialysis yesterday. He does feel better. He has had no further coffee-ground vomitus. He is due for EGD this morning.           Physical Exam: Blood pressure 126/67, pulse 102, temperature 98.4 F (36.9 C), temperature source Oral, resp. rate 12, height 6\' 3"  (1.905 m), weight 99.2 kg (218 lb 11.1 oz), SpO2 96.00%. He looks very pale. His hemoglobin is stable but still low. Heart sounds are present in sinus rhythm. Abdomen is soft, distended but not tender. He probably does have some ascites. Heart sounds are present without murmurs or added sounds. He is alert and orientated. There is no evidence of hepatic encephalopathy at this point.   Investigations:  Recent Results (from the past 240 hour(s))  MRSA PCR SCREENING     Status: None   Collection Time    02/14/13  9:41 PM      Result Value Range Status   MRSA by PCR NEGATIVE  NEGATIVE Final   Comment:            The GeneXpert MRSA Assay (FDA     approved for NASAL specimens     only), is one component of a     comprehensive MRSA colonization     surveillance program. It is not     intended to diagnose MRSA     infection nor to guide or     monitor treatment for     MRSA infections.     Basic Metabolic Panel:  Recent Labs  81/19/14 0443 02/16/13 0452  NA 140 139  K 4.6 4.2  CL 110 104  CO2 17* 25  GLUCOSE 149* 108*  BUN 92* 43*  CREATININE 12.05* 6.92*  CALCIUM 8.8 8.2*  PHOS  --  4.6   Liver Function Tests:  Recent Labs  02/15/13 0443  AST 41*  ALT 13  ALKPHOS 89  BILITOT 1.2  PROT 5.7*  ALBUMIN 1.7*     CBC:  Recent Labs  02/14/13 1856 02/15/13 0443 02/16/13 0452  WBC 8.1 6.4 6.6  NEUTROABS 4.6  --   --   HGB 6.0* 6.3* 7.7*  HCT 18.6* 19.5* 23.2*   MCV 92.5 92.4 92.4  PLT 177 149* 118*    X-ray Chest Pa Or Ap  02/14/2013   *RADIOLOGY REPORT*  Clinical Data: Weakness  CHEST - 1 VIEW  Comparison: 01/15/2013  Findings: The cardiac shadow is stable.  The lungs are well-aerated bilaterally.  Some chronic changes are seen in the right lung base. The osseous structures are within normal limits.  IMPRESSION: No acute abnormality noted.   Original Report Authenticated By: Alcide Clever, M.D.      Medications: I have reviewed the patient's current medications.  Impression: 1. Upper GI bleed, status post 1 unit blood transfusion. Hemoglobin still low at 7.7. 2. End-stage renal disease on hemodialysis, will need hemodialysis today. 3. End stage liver disease secondary to alcoholism. 4. History of gastric antral vascular ectasia, status post APC treatment. 5. Type 2 diabetes mellitus. 6. Acute blood loss anemia secondary to #1.     Plan: 1. Give further 2 units of blood transfusion slowly. Continue with PPI infusion. 2. EGD today.  Consultants:  Nephrology consultation .  Gastroenterology consultation.   Procedures:  None.  Antibiotics:  None.                   Code Status: Full code.  Family Communication: Discuss plan with patient at the bedside.   Disposition Plan: Home when medically stable.  Time spent: 20 minutes.   LOS: 2 days   Wilson Singer Pager [phone]  02/16/2013, 7:52 AM

## 2013-02-16 NOTE — Clinical Social Work Psychosocial (Signed)
    Clinical Social Work Department BRIEF PSYCHOSOCIAL ASSESSMENT 02/16/2013  Patient:  Timothy Chan, Timothy Chan     Account Number:  [id number]     Admit date:  02/14/2013  Clinical Social Worker:  Santa Genera, CLINICAL SOCIAL WORKER  Date/Time:  02/16/2013 02:00 PM  Referred by:  Physician  Date Referred:   Referred for  Transportation assistance   Other Referral:   Interview type:  Patient Other interview type:    PSYCHOSOCIAL DATA Living Status:  ALONE Admitted from facility:   Level of care:   Primary support name:  Ray Spearman Primary support relationship to patient:  CHILD, ADULT Degree of support available:   Patient appears to have limited support    CURRENT CONCERNS  Other Concerns:    SOCIAL WORK ASSESSMENT / PLAN CSW met w patient at bedside, patient frustrated that he has difficulty walking from car into dialysis center for his M, W, F dialysis treatments. Patient lives alone. Says he can get into/out of car and can drive to/from dialysis, but cannot lift his wheelchair in the car.  Gets easily fatigued walking w his walker.  Wants dialysis center to assist him into center, but per patient they will not do this.  Has spoken w SW at [facility], and she has not provided assistance patient wished for.  Is on waiting list for [facility] dialysis transport help, [facility] would get patient into/out of center w wheelchair.  In meantime, patient can pay [facility] privately for transport at cost of approx $20/day.  Patient states he cannot afford this cost.  Has applied for Medicaid but has been denied several times.  Is married to wife who now lives in [place] - due to mishap w transportation, she overstayed her current visa and cannot return to [place] for 10 years.  Patient frustrated w situation, says if she were here, she would assist w transport.  Wants to move to [place] if he can determine that dialysis is avaiable there.  CSW suggested that he contact his wife to investigate options in [place].    Spoke  w [facility] administration regarding patient's needs. Facility is legally prohibited from assisting patients into/out of facility.  Says patient is quite weak, they understand patient's needs and have worked to get him on waiting list for [facility].  Will check to see if there is any further assistance that can be provided.  Says patient has limited family support- father is quite elderly, son lives in [place].  Patient was placed at [facility] but discharged AMA approx 2 months ago - wanted to return to his own home rather than stay in SNF.  [facility] administration will call after they investigate any further options for assistance.   Assessment/plan status:  Psychosocial Support/Ongoing Assessment of Needs Other assessment/ plan:   Information/referral to community resources:   [employer]    PATIENT'S/FAMILY'S RESPONSE TO PLAN OF CARE: Patient frustrated that his needs are not being met by outpatient dialysis.        Santa Genera, LCSW Clinical Social Worker [phone])

## 2013-02-16 NOTE — Clinical Documentation Improvement (Signed)
THIS DOCUMENT IS NOT A PERMANENT PART OF THE MEDICAL RECORD  Please update your documentation with the medical record to reflect your response to this query. If you need help knowing how to do this please call [phone].  02/16/13   Dear Dr. Karilyn Cota / Associates,  A review of the patient medical record has revealed the following indicators.  Based on your clinical judgment, please clarify and document in a progress note and/or discharge summary the clinical condition associated with the following supporting information:    Per 8/7 RD note: Per approved criteria  -Non-severe (moderate) malnutrition in the context of chronic illness  Pt meets criteria for non-severe malnutrition in the context of chronic illness given his unplanned wt loss and inadequate energy intake </= 75% for >/= 1 month  Hx also includes anorexia and current wt reflects significant, unintentional wt loss of 14#, 6% x 30 days. Height: 6'3"  Weight: 205lbs.      BMI: 25.74  NUTRITION DIAGNOSIS: Inadequate oral intake related to altered GI function as evidenced by upper GI bleed and reported anorexia prior to admission significant, unplanned wt loss.  Goal: Pt to meet >/= 90% of their estimated nutrition needs  Monitor: Diet advancement, Po intake, labs and wt trends INTERVENTION:  Follow for diet advancement and nutrition care progression   Possible Clinical Conditions?  Mild Malnutrition  Moderate Malnutrition Severe Malnutrition   Protein Calorie Malnutrition Severe Protein Calorie Malnutrition Other Condition________________ Cannot clinically determine    You may use possible, probable, or suspect with inpatient documentation. possible, probable, suspected diagnoses MUST be documented at the time of discharge  Reviewed: additional documentation in the medical record  Thank Angelene Giovanni RN Clinical Documentation Specialist: [phone] [facility] Health Information Management [facility]

## 2013-02-16 NOTE — Progress Notes (Signed)
Subjective: Interval History: has no complaint of nausea or vomiting. History that is getting better. Presently patient denies any coffee-ground vomitus. He complains of some weakness..  Objective: Vital signs in last 24 hours: Temp:  [98 F (36.7 C)-98.5 F (36.9 C)] 98.4 F (36.9 C) (08/08 0400) Pulse Rate:  [89-112] 102 (08/07 1650) Resp:  [10-24] 12 (08/08 0600) BP: (78-158)/(49-69) 126/67 mmHg (08/08 0400) SpO2:  [96 %-98 %] 96 % (08/08 0400) Weight:  [92.1 kg (203 lb 0.7 oz)-99.2 kg (218 lb 11.1 oz)] 99.2 kg (218 lb 11.1 oz) (08/08 0500) Weight change: 0.7 kg (1 lb 8.7 oz)  Intake/Output from previous day: 08/07 0701 - 08/08 0700 In: 1006 [I.V.:725; Blood:281] Out: 2157 [Urine:200; Stool:1] Intake/Output this shift:    General appearance: alert, cooperative and no distress Resp: clear to auscultation bilaterally Cardio: regular rate and rhythm, S1, S2 normal, no murmur, click, rub or gallop GI: soft, non-tender; bowel sounds normal; no masses,  no organomegaly Extremities: extremities normal, atraumatic, no cyanosis or edema  Lab Results:  Recent Labs  02/15/13 0443 02/16/13 0452  WBC 6.4 6.6  HGB 6.3* 7.7*  HCT 19.5* 23.2*  PLT 149* 118*   BMET:  Recent Labs  02/15/13 0443 02/16/13 0452  NA 140 139  K 4.6 4.2  CL 110 104  CO2 17* 25  GLUCOSE 149* 108*  BUN 92* 43*  CREATININE 12.05* 6.92*  CALCIUM 8.8 8.2*   No results found for this basename: PTH,  in the last 72 hours Iron Studies: No results found for this basename: IRON, TIBC, TRANSFERRIN, FERRITIN,  in the last 72 hours  Studies/Results: X-ray Chest Pa Or Ap  02/14/2013   *RADIOLOGY REPORT*  Clinical Data: Weakness  CHEST - 1 VIEW  Comparison: 01/15/2013  Findings: The cardiac shadow is stable.  The lungs are well-aerated bilaterally.  Some chronic changes are seen in the right lung base. The osseous structures are within normal limits.  IMPRESSION: No acute abnormality noted.   Original Report  Authenticated By: Alcide Clever, M.D.    I have reviewed the patient's current medications.  Assessment/Plan: Problem #1 end-stage renal disease is status post hemodialysis yesterday his BUN is 43 creatinine 6.92 with potassium of 4.2 which seems to be better. Problem #2 anemia his hemoglobin is supple 7.7 and  hematocrit 23.2 a combination of GI bleeding and anemia of chronic disease. Problem #3 history of diabetes Problem #4 history of liver cirrhosis  Problem #5 metabolic bone disease his calcium and phosphorus is was in acceptable range. Problem #6 chronic abdominal and back pain. Problem #7 history of poor appetite and nausea most likely secondary to uremia presently feels better after he received hemodialysis. Plan: We'll make arrangements for patient to get dialysis tomorrow again. We'll check his basic metabolic panel and CBC in the morning.   LOS: 2 days   Zaelyn Noack S 02/16/2013,8:03 AM

## 2013-02-16 NOTE — Op Note (Signed)
EGD PROCEDURE REPORT  PATIENT:  Timothy Chan  MR#:  [id number] Birthdate:  1951/03/04, 62 y.o., male Endoscopist:  Dr. Malissa Hippo, MD Procedure Date: 02/16/2013  Procedure:   EGD  Indications:  Patient is 62 year old Caucasian male who presents with upper GI bleed anemia. He has multiple medical problems including end-stage renal disease on dialysis and advanced cirrhosis. 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 He has received 2 units of PRBCs.            Informed Consent:  The risks, benefits, alternatives & imponderables which include, but are not limited to, bleeding, infection, perforation, drug reaction and potential missed lesion have been reviewed.  The potential for biopsy, lesion removal, esophageal dilation, etc. have also been discussed.  Questions have been answered.  All parties agreeable.  Please see history & physical in medical record for more information.  Medications:  Demerol 50 mg IV Versed 8 mg IV Cetacaine spray topically for oropharyngeal anesthesia  Description of procedure:  The endoscope was introduced through the mouth and advanced to the second portion of the duodenum without difficulty or limitations. The mucosal surfaces were surveyed very carefully during advancement of the scope and upon withdrawal.  Findings:  Esophagus:  Mucosa of the esophagus was normal. GE junction was unremarkable. GEJ:  41 cm Stomach:  Stomach was empty and distended very well with insufflation. Folds in the proximal stomach are prominent. Mucosa at gastric body revealed mosaic pattern or snakeskin as well as patchy red areas. Numerous telangiectasia noted at gastric antrum. No active bleeding noted but two of these were coated specks of fresh blood to follow the channel was patent. No fundal varices identified. Duodenum:  Normal bulbar and post bulbar mucosa.  Therapeutic/Diagnostic Maneuvers Performed:   Several telangiectasias Ealer  ablated with argon plasma coagulator. He still had many left.  Complications:  None.  Impression: No evidence of dislocation or gastric varices. Portal gastropathy. Multiple gastric antral vascular ectasia. Several of these lesions were treated.  Recommendations:  Advance diet. H&H in am. Ceftriaxone 1 g IV today. Continue IV PPI until tomorrow morning.  Tramane Gorum U  02/16/2013  3:33 PM  CC: Dr. Ignatius Specking., MD & Dr. Bonnetta Barry ref. provider found

## 2013-02-17 DIAGNOSIS — K31819 Angiodysplasia of stomach and duodenum without bleeding: Principal | ICD-10-CM

## 2013-02-17 LAB — CBC
HCT: 26.9 % — ABNORMAL LOW (ref 39.0–52.0)
Hemoglobin: 9.1 g/dL — ABNORMAL LOW (ref 13.0–17.0)
MCH: 30.5 pg (ref 26.0–34.0)
MCHC: 33.8 g/dL (ref 30.0–36.0)
MCV: 90.3 fL (ref 78.0–100.0)

## 2013-02-17 LAB — BASIC METABOLIC PANEL
BUN: 48 mg/dL — ABNORMAL HIGH (ref 6–23)
CO2: 23 mEq/L (ref 19–32)
Calcium: 7.9 mg/dL — ABNORMAL LOW (ref 8.4–10.5)
GFR calc non Af Amer: 7 mL/min — ABNORMAL LOW (ref >90)
Glucose, Bld: 197 mg/dL — ABNORMAL HIGH (ref 70–99)

## 2013-02-17 LAB — GLUCOSE, CAPILLARY: Glucose-Capillary: 158 mg/dL — ABNORMAL HIGH (ref 70–99)

## 2013-02-17 MED ORDER — SODIUM CHLORIDE 0.9 % IV SOLN: 100.0000 mL | INTRAVENOUS | Status: DC | PRN

## 2013-02-17 MED ORDER — INSULIN ASPART 100 UNIT/ML ~~LOC~~ SOLN
0.0000 [IU] | Freq: Three times a day (TID) | SUBCUTANEOUS | Status: DC
  Administered 2013-02-17 (×3): 2 [IU] via SUBCUTANEOUS
  Administered 2013-02-18 (×2): 3 [IU] via SUBCUTANEOUS
  Administered 2013-02-18: 5 [IU] via SUBCUTANEOUS
  Administered 2013-02-19: 2 [IU] via SUBCUTANEOUS
  Administered 2013-02-19: 1 [IU] via SUBCUTANEOUS

## 2013-02-17 MED ORDER — FUROSEMIDE 80 MG PO TABS: 80.0000 mg | ORAL_TABLET | Freq: Two times a day (BID) | ORAL | Status: DC

## 2013-02-17 MED ORDER — PANTOPRAZOLE SODIUM 40 MG PO TBEC
40.0000 mg | DELAYED_RELEASE_TABLET | Freq: Two times a day (BID) | ORAL | Status: DC
  Administered 2013-02-17 – 2013-02-19 (×5): 40 mg via ORAL
  Filled 2013-02-17 (×5): qty 1

## 2013-02-17 MED ORDER — INSULIN ASPART 100 UNIT/ML ~~LOC~~ SOLN
0.0000 [IU] | Freq: Every day | SUBCUTANEOUS | Status: DC
  Administered 2013-02-17: 3 [IU] via SUBCUTANEOUS

## 2013-02-17 MED ORDER — ALTEPLASE 2 MG IJ SOLR: 2.0000 mg | Freq: Once | INTRAMUSCULAR | Status: AC | PRN

## 2013-02-17 MED ORDER — FUROSEMIDE 40 MG PO TABS
40.0000 mg | ORAL_TABLET | Freq: Two times a day (BID) | ORAL | Status: DC
  Administered 2013-02-17 – 2013-02-19 (×5): 40 mg via ORAL
  Filled 2013-02-17 (×5): qty 1

## 2013-02-17 NOTE — Progress Notes (Signed)
Subjective: Interval History: has no complaint of nausea or vomiting. History that is getting better. Presently patient denies any coffee-ground vomitus. He is feeling better..  Objective: Vital signs in last 24 hours: Temp:  [97.6 F (36.4 C)-98.9 F (37.2 C)] 98.9 F (37.2 C) (08/09 0400) Pulse Rate:  [93-110] 93 (08/08 1900) Resp:  [9-22] 10 (08/09 0630) BP: (106-164)/(37-105) 158/64 mmHg (08/09 0630) SpO2:  [99 %-100 %] 100 % (08/08 1530) Weight:  [97.9 kg (215 lb 13.3 oz)] 97.9 kg (215 lb 13.3 oz) (08/09 0500) Weight change: 3.8 kg (8 lb 6 oz)  Intake/Output from previous day: 08/08 0701 - 08/09 0700 In: 2099.3 [P.O.:440; I.V.:1034.3; Blood:625] Out: -  Intake/Output this shift:    General appearance: alert, cooperative and no distress Resp: clear to auscultation bilaterally Cardio: regular rate and rhythm, S1, S2 normal, no murmur, click, rub or gallop GI: soft, non-tender; bowel sounds normal; no masses,  no organomegaly Extremities: extremities normal, atraumatic, no cyanosis or edema  Lab Results:  Recent Labs  02/16/13 0452 02/17/13 0506  WBC 6.6 5.8  HGB 7.7* 9.1*  HCT 23.2* 26.9*  PLT 118* 98*   BMET:   Recent Labs  02/16/13 0452 02/17/13 0506  NA 139 136  K 4.2 3.6  CL 104 102  CO2 25 23  GLUCOSE 108* 197*  BUN 43* 48*  CREATININE 6.92* 7.73*  CALCIUM 8.2* 7.9*   No results found for this basename: PTH,  in the last 72 hours Iron Studies: No results found for this basename: IRON, TIBC, TRANSFERRIN, FERRITIN,  in the last 72 hours  Studies/Results: Dg Chest Port 1 View  02/16/2013   *RADIOLOGY REPORT*  Clinical Data: 62 year old male status post PICC line placement.  PORTABLE CHEST - 1 VIEW  Comparison: 02/14/2013 and earlier.  Findings: Portable AP upright view 1253 hours.  Right upper extremity approach PICC line, tip projects about 2 cm below the carina at the lower SVC level.  Stable lung volumes.  No pneumothorax.  Stable to mildly increased  asymmetric reticulonodular opacity most pronounced in the mid and right lower lung.  Stable mild right lung base veiling opacity. Stable cardiac size and mediastinal contours.  Visualized tracheal air column is within normal limits.  IMPRESSION: 1.  Right PICC line catheter, tip at the lower SVC level. 2.  Stable to mildly increased right lung base opacity.   Original Report Authenticated By: Erskine Speed, M.D.    I have reviewed the patient's current medications.  Assessment/Plan: Problem #1 end-stage renal disease is status post hemodialysis the day before yesterday his BUN is 48 and  creatinine 7.73 with potassium of 3.6 which seems to be better. Problem #2 anemia his hemoglobin is supple 9.1 and  hematocrit 26.9 a combination of GI bleeding and anemia of chronic disease. Problem #3 history of diabetes Problem #4 history of liver cirrhosis  Problem #5 metabolic bone disease his calcium and phosphorus is was in acceptable range. Problem #6 chronic abdominal and back pain. Problem #7 history of poor appetite and nausea most likely secondary to uremia presently feels better after he received hemodialysis. Plan: We'll make arrangements for patient to get dialysis today. Discussed with him about the importance of not missing his dialysis.   LOS: 3 days   Timothy Chan S 02/17/2013,9:33 AM

## 2013-02-17 NOTE — Procedures (Signed)
   HEMODIALYSIS TREATMENT NOTE:  3.5 hour HD session completed through left forearm AVF (15g/antegrade).  Goal met:  Tolerated removal of 1 liter with 15 minutes of interrupted ultrafiltration time due to (asymptomatic) hypotension.  All blood was reinfused.  Hemostasis was achieved within 10 minutes.  Report given to Tory Emerald, RN.

## 2013-02-17 NOTE — Progress Notes (Signed)
Timothy Chan:[id number] DOB: 1950/08/28 DOA: 02/14/2013 PCP: Ignatius Specking., MD   Subjective: This man had EGD yesterday which showed multiple gastric antral vascular ectasia, which I suspect is responsible for the bleeding. They were treated with argon plasma coagulator. He now complains of diarrhea. He did have blood transfusion yesterday 2 units. He is due for dialysis today.          Physical Exam: Blood pressure 158/64, pulse 93, temperature 98.9 F (37.2 C), temperature source Oral, resp. rate 10, height 6\' 3"  (1.905 m), weight 97.9 kg (215 lb 13.3 oz), SpO2 100.00%. He looks somewhat better. His hemoglobin is improved. Heart sounds are present in sinus rhythm. Abdomen is soft, distended but not tender. He probably does have some ascites. Heart sounds are present without murmurs or added sounds. He is alert and orientated. There is no evidence of hepatic encephalopathy at this point.   Investigations:  Recent Results (from the past 240 hour(s))  MRSA PCR SCREENING     Status: None   Collection Time    02/14/13  9:41 PM      Result Value Range Status   MRSA by PCR NEGATIVE  NEGATIVE Final   Comment:            The GeneXpert MRSA Assay (FDA     approved for NASAL specimens     only), is one component of a     comprehensive MRSA colonization     surveillance program. It is not     intended to diagnose MRSA     infection nor to guide or     monitor treatment for     MRSA infections.     Basic Metabolic Panel:  Recent Labs  81/19/14 0452 02/17/13 0506  NA 139 136  K 4.2 3.6  CL 104 102  CO2 25 23  GLUCOSE 108* 197*  BUN 43* 48*  CREATININE 6.92* 7.73*  CALCIUM 8.2* 7.9*  PHOS 4.6  --    Liver Function Tests:  Recent Labs  02/15/13 0443  AST 41*  ALT 13  ALKPHOS 89  BILITOT 1.2  PROT 5.7*  ALBUMIN 1.7*     CBC:  Recent Labs  02/14/13 1856  02/16/13 0452 02/17/13 0506  WBC 8.1  < > 6.6 5.8  NEUTROABS 4.6  --   --   --   HGB 6.0*  < >  7.7* 9.1*  HCT 18.6*  < > 23.2* 26.9*  MCV 92.5  < > 92.4 90.3  PLT 177  < > 118* 98*  < > = values in this interval not displayed.  Dg Chest Port 1 View  02/16/2013   *RADIOLOGY REPORT*  Clinical Data: 62 year old male status post PICC line placement.  PORTABLE CHEST - 1 VIEW  Comparison: 02/14/2013 and earlier.  Findings: Portable AP upright view 1253 hours.  Right upper extremity approach PICC line, tip projects about 2 cm below the carina at the lower SVC level.  Stable lung volumes.  No pneumothorax.  Stable to mildly increased asymmetric reticulonodular opacity most pronounced in the mid and right lower lung.  Stable mild right lung base veiling opacity. Stable cardiac size and mediastinal contours.  Visualized tracheal air column is within normal limits.  IMPRESSION: 1.  Right PICC line catheter, tip at the lower SVC level. 2.  Stable to mildly increased right lung base opacity.   Original Report Authenticated By: Erskine Speed, M.D.      Medications: I have reviewed the  patient's current medications.  Impression: 1. Upper GI bleed, status post 3 units blood transfusion. Hemoglobin now acceptable at 9.1. 2. End-stage renal disease on hemodialysis, will need hemodialysis today. 3. End stage liver disease secondary to alcoholism. 4. History of gastric antral vascular ectasia, status post APC treatment. 5. Type 2 diabetes mellitus. 6. Acute blood loss anemia secondary to #1. 7. Diarrhea.     Plan: 1. Stool for culture. 2. Restart home on Lasix at half the dose. 3. Patient can move to the floor.  Consultants:  Nephrology consultation .  Gastroenterology consultation.   Procedures:  None.   Antibiotics:  None.                   Code Status: Full code.  Family Communication: Discuss plan with patient at the bedside.   Disposition Plan: Home when medically stable.  Time spent: 20 minutes.   LOS: 3 days   Wilson Singer Pager [phone]  02/17/2013, 7:44  AM

## 2013-02-17 NOTE — Progress Notes (Addendum)
Subjective; Patient has no complaints. He has very good appetite. He 100% of his breakfast. He denies nausea vomiting or abdominal pain. He states his stool is not black today but is very dark.  Objective; BP 164/73  Pulse 93  Temp(Src) 98.4 F (36.9 C) (Oral)  Resp 19  Ht 6\' 3"  (1.905 m)  Wt 215 lb 13.3 oz (97.9 kg)  BMI 26.98 kg/m2  SpO2 100% Patient is alert and does not have asterixis. Abdomen is full but soft and nontender without organomegaly or masses. He has some edema to right leg.  Lab data; WBC 5.8 H&H is 9.1 and 26.9 Platelet count 98K.  Assessment; #1. 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 Status post EGD with APC therapy yesterday. Hemoglobin has, from 6.3 on admission to 9.1 today. #2. Decompensated cirrhosis with ascites and hepatic encephalopathy. #3.ESRD. Patient to be dialyzed again today.  Recommendations; Continue by mouth pantoprazole. H&H in a.m.

## 2013-02-18 ENCOUNTER — Inpatient Hospital Stay (HOSPITAL_COMMUNITY): Payer: Medicare Other

## 2013-02-18 DIAGNOSIS — R109 Unspecified abdominal pain: Secondary | ICD-10-CM

## 2013-02-18 LAB — BASIC METABOLIC PANEL
BUN: 28 mg/dL — ABNORMAL HIGH (ref 6–23)
Calcium: 7.9 mg/dL — ABNORMAL LOW (ref 8.4–10.5)
GFR calc Af Amer: 13 mL/min — ABNORMAL LOW (ref >90)
GFR calc non Af Amer: 11 mL/min — ABNORMAL LOW (ref >90)
Glucose, Bld: 273 mg/dL — ABNORMAL HIGH (ref 70–99)
Sodium: 134 mEq/L — ABNORMAL LOW (ref 135–145)

## 2013-02-18 LAB — GLUCOSE, CAPILLARY

## 2013-02-18 LAB — CBC
Hemoglobin: 9 g/dL — ABNORMAL LOW (ref 13.0–17.0)
MCH: 31 pg (ref 26.0–34.0)
MCHC: 34 g/dL (ref 30.0–36.0)
RDW: 18.2 % — ABNORMAL HIGH (ref 11.5–15.5)

## 2013-02-18 LAB — PREPARE RBC (CROSSMATCH)

## 2013-02-18 MED ORDER — BENZONATATE 100 MG PO CAPS: 200.0000 mg | ORAL_CAPSULE | Freq: Three times a day (TID) | ORAL | Status: DC | PRN

## 2013-02-18 MED ORDER — GUAIFENESIN ER 600 MG PO TB12
1200.0000 mg | ORAL_TABLET | Freq: Two times a day (BID) | ORAL | Status: DC
  Administered 2013-02-18 – 2013-02-19 (×2): 1200 mg via ORAL
  Filled 2013-02-18 (×2): qty 2

## 2013-02-18 MED ORDER — HYOSCYAMINE SULFATE 0.125 MG SL SUBL
0.1250 mg | SUBLINGUAL_TABLET | Freq: Four times a day (QID) | SUBLINGUAL | Status: DC | PRN
  Administered 2013-02-18 – 2013-02-19 (×5): 0.125 mg via SUBLINGUAL
  Filled 2013-02-18 (×5): qty 1

## 2013-02-18 MED ORDER — CIPROFLOXACIN HCL 250 MG PO TABS
250.0000 mg | ORAL_TABLET | Freq: Every day | ORAL | Status: DC
  Administered 2013-02-19: 250 mg via ORAL
  Filled 2013-02-18: qty 1

## 2013-02-18 NOTE — Progress Notes (Signed)
Timothy Chan:[id number] DOB: Nov 17, 1950 DOA: 02/14/2013 PCP: Ignatius Specking., MD   Subjective: This man had EGD which showed multiple gastric antral vascular ectasia, which I suspect is responsible for the bleeding. They were treated with argon plasma coagulator. He  complains of diarrhea and some abdominal cramping. He apparently still has black stools.          Physical Exam: Blood pressure 151/82, pulse 115, temperature 98.4 F (36.9 C), temperature source Oral, resp. rate 18, height 6\' 3"  (1.905 m), weight 98 kg (216 lb 0.8 oz), SpO2 98.00%. He looks somewhat better. His hemoglobin is stable. Heart sounds are present in sinus rhythm. Abdomen is soft, distended but not tender. He probably does have some ascites. Heart sounds are present without murmurs or added sounds. He is alert and orientated. There is no evidence of hepatic encephalopathy at this point.   Investigations:  Recent Results (from the past 240 hour(s))  MRSA PCR SCREENING     Status: None   Collection Time    02/14/13  9:41 PM      Result Value Range Status   MRSA by PCR NEGATIVE  NEGATIVE Final   Comment:            The GeneXpert MRSA Assay (FDA     approved for NASAL specimens     only), is one component of a     comprehensive MRSA colonization     surveillance program. It is not     intended to diagnose MRSA     infection nor to guide or     monitor treatment for     MRSA infections.     Basic Metabolic Panel:  Recent Labs  56/21/30 0452 02/17/13 0506 02/18/13 0459  NA 139 136 134*  K 4.2 3.6 3.5  CL 104 102 99  CO2 25 23 26   GLUCOSE 108* 197* 273*  BUN 43* 48* 28*  CREATININE 6.92* 7.73* 5.06*  CALCIUM 8.2* 7.9* 7.9*  PHOS 4.6  --   --       CBC:  Recent Labs  02/17/13 0506 02/18/13 0459  WBC 5.8 7.6  HGB 9.1* 9.0*  HCT 26.9* 26.5*  MCV 90.3 91.4  PLT 98* 74*    Dg Chest Port 1 View  02/16/2013   *RADIOLOGY REPORT*  Clinical Data: 62 year old male status post PICC line  placement.  PORTABLE CHEST - 1 VIEW  Comparison: 02/14/2013 and earlier.  Findings: Portable AP upright view 1253 hours.  Right upper extremity approach PICC line, tip projects about 2 cm below the carina at the lower SVC level.  Stable lung volumes.  No pneumothorax.  Stable to mildly increased asymmetric reticulonodular opacity most pronounced in the mid and right lower lung.  Stable mild right lung base veiling opacity. Stable cardiac size and mediastinal contours.  Visualized tracheal air column is within normal limits.  IMPRESSION: 1.  Right PICC line catheter, tip at the lower SVC level. 2.  Stable to mildly increased right lung base opacity.   Original Report Authenticated By: Erskine Speed, M.D.      Medications: I have reviewed the patient's current medications.  Impression: 1. Upper GI bleed, status post 3 units blood transfusion. Hemoglobin now stable at 9.0. 2. End-stage renal disease on hemodialysis. 3. End stage liver disease secondary to alcoholism. 4. History of gastric antral vascular ectasia, status post APC treatment. 5. Type 2 diabetes mellitus. 6. Acute blood loss anemia secondary to #1. 7. Diarrhea.  Plan: 1. Stool for culture, pending. 2. Abdominal x-ray this morning.  Consultants:  Nephrology consultation .  Gastroenterology consultation.   Procedures:  None.   Antibiotics:  None.                   Code Status: Full code.  Family Communication: Discuss plan with patient at the bedside.   Disposition Plan: Home when medically stable.  Time spent: 20 minutes.   LOS: 4 days   Wilson Singer Pager [phone]  02/18/2013, 8:24 AM

## 2013-02-18 NOTE — Progress Notes (Signed)
Subjective. Patient experience abdominal cramps this morning. He remains with very good appetite. He denies nausea or vomiting. He had a bowel movement this morning his stool is black.   Objective; BP 151/82  Pulse 115  Temp(Src) 98.9 F (37.2 C) (Oral)  Resp 18  Ht 6\' 3"  (1.905 m)  Wt 216 lb 0.8 oz (98 kg)  BMI 27 kg/m2  SpO2 98% Patient is alert and without asterixis. Abdomen is full with normal bowel sounds. On palpation it is somewhat tense but nontender. No organomegaly or masses. Trace edema involving left leg.  Lab data; WBC 7.6 H&H 9 and 26.5 Platelet count 74K Serum sodium 134, potassium 3.5, carotid 99, CO2 26, glucose 273, BUN 8, creatinine 5.06.  Assessment; #1. 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 Status post EGD with APC therapy 2 days ago. H&H low but stable. No evidence of active bleed. #2. Decompensated cirrhosis with ascites and hepatic encephalopathy.  #3.ESRD. Patient was dialyzed yesterday. #4. Abdominal cramps. Abdominal examination is benign. He has history of peritonitis when he had PD catheter. He received gram of ceftriaxone 2 days ago. He needs to be back on Cipro for SBE prophylaxis.   Recommendations; Resume Cipro at 250 mg by mouth daily. Hyoscyamine sublingual one q. 6 when necessary. H&H in a.m.

## 2013-02-18 NOTE — Progress Notes (Signed)
Pt transferred to room 323 per MD order. Report called to RN. Pt transferred via bed with personal belongings. Pt stated he wanted to call his sons and let them know he had transferred out of the ICU.

## 2013-02-18 NOTE — Progress Notes (Signed)
Subjective: Interval History: has no complaint of nausea or vomiting. History that is getting better. Denies any difficulty in breathing.his has some dirrhea  Objective: Vital signs in last 24 hours: Temp:  [97.9 F (36.6 C)-98.9 F (37.2 C)] 98.4 F (36.9 C) (08/10 0355) Pulse Rate:  [98-124] 101 (08/10 0355) Resp:  [19-20] 20 (08/10 0355) BP: (94-165)/(40-89) 144/68 mmHg (08/10 0355) SpO2:  [97 %-98 %] 98 % (08/10 0050) Weight:  [97.9 kg (215 lb 13.3 oz)-98 kg (216 lb 0.8 oz)] 98 kg (216 lb 0.8 oz) (08/10 0500) Weight change: 0 kg (0 lb)  Intake/Output from previous day: 08/09 0701 - 08/10 0700 In: 1210 [P.O.:1000; I.V.:210] Out: 1657 [Urine:600] Intake/Output this shift:    General appearance: alert, cooperative and no distress Resp: clear to auscultation bilaterally Cardio: regular rate and rhythm, S1, S2 normal, no murmur, click, rub or gallop GI: soft, non-tender; bowel sounds normal; no masses,  no organomegaly Extremities: extremities normal, atraumatic, no cyanosis or edema  Lab Results:  Recent Labs  02/17/13 0506 02/18/13 0459  WBC 5.8 7.6  HGB 9.1* 9.0*  HCT 26.9* 26.5*  PLT 98* 74*   BMET:   Recent Labs  02/17/13 0506 02/18/13 0459  NA 136 134*  K 3.6 3.5  CL 102 99  CO2 23 26  GLUCOSE 197* 273*  BUN 48* 28*  CREATININE 7.73* 5.06*  CALCIUM 7.9* 7.9*   No results found for this basename: PTH,  in the last 72 hours Iron Studies: No results found for this basename: IRON, TIBC, TRANSFERRIN, FERRITIN,  in the last 72 hours  Studies/Results: Dg Chest Port 1 View  02/16/2013   *RADIOLOGY REPORT*  Clinical Data: 62 year old male status post PICC line placement.  PORTABLE CHEST - 1 VIEW  Comparison: 02/14/2013 and earlier.  Findings: Portable AP upright view 1253 hours.  Right upper extremity approach PICC line, tip projects about 2 cm below the carina at the lower SVC level.  Stable lung volumes.  No pneumothorax.  Stable to mildly increased asymmetric  reticulonodular opacity most pronounced in the mid and right lower lung.  Stable mild right lung base veiling opacity. Stable cardiac size and mediastinal contours.  Visualized tracheal air column is within normal limits.  IMPRESSION: 1.  Right PICC line catheter, tip at the lower SVC level. 2.  Stable to mildly increased right lung base opacity.   Original Report Authenticated By: Erskine Speed, M.D.    I have reviewed the patient's current medications.  Assessment/Plan: Problem #1 end-stage renal disease is status post hemodialysis the day yesterday his BUN is 28 and  creatinine 5.06 with potassium of 3.5 which seems to be better. Problem #2 anemia his hemoglobin is supple 9.0 and  hematocrit 26.5 a combination of GI bleeding and anemia of chronic disease. Problem #3 history of diabetes Problem #4 history of liver cirrhosis  Problem #5 metabolic bone disease his calcium and phosphorus is was in acceptable range. Problem #6 chronic abdominal and back pain. Problem #7 history of poor appetite and nausea most likely secondary to uremia presently feels better after he received hemodialysis. Plan: We'll continue with present treatment. If discharged patient will go to his regular dialysis unit    LOS: 4 days   Floreine Kingdon S 02/18/2013,7:36 AM

## 2013-02-19 ENCOUNTER — Telehealth (INDEPENDENT_AMBULATORY_CARE_PROVIDER_SITE_OTHER): Payer: Self-pay | Admitting: *Deleted

## 2013-02-19 ENCOUNTER — Encounter (HOSPITAL_COMMUNITY): Payer: Self-pay | Admitting: Internal Medicine

## 2013-02-19 LAB — CBC
Platelets: 85 10*3/uL — ABNORMAL LOW (ref 150–400)
RBC: 2.79 MIL/uL — ABNORMAL LOW (ref 4.22–5.81)
WBC: 8.8 10*3/uL (ref 4.0–10.5)

## 2013-02-19 LAB — BASIC METABOLIC PANEL
CO2: 23 mEq/L (ref 19–32)
Chloride: 101 mEq/L (ref 96–112)
Sodium: 135 mEq/L (ref 135–145)

## 2013-02-19 LAB — GLUCOSE, CAPILLARY: Glucose-Capillary: 146 mg/dL — ABNORMAL HIGH (ref 70–99)

## 2013-02-19 MED ORDER — SODIUM CHLORIDE 0.9 % IV SOLN: 100.0000 mL | INTRAVENOUS | Status: DC | PRN

## 2013-02-19 MED ORDER — ALPRAZOLAM 0.5 MG PO TABS
0.5000 mg | ORAL_TABLET | Freq: Once | ORAL | Status: AC
  Administered 2013-02-19: 0.5 mg via ORAL
  Filled 2013-02-19: qty 1

## 2013-02-19 MED ORDER — DIPHENHYDRAMINE HCL 25 MG PO CAPS
50.0000 mg | ORAL_CAPSULE | Freq: Four times a day (QID) | ORAL | Status: DC | PRN
  Administered 2013-02-19: 50 mg via ORAL
  Filled 2013-02-19: qty 2

## 2013-02-19 MED ORDER — HEPARIN SODIUM (PORCINE) 1000 UNIT/ML DIALYSIS
1000.0000 [IU] | INTRAMUSCULAR | Status: DC | PRN
  Filled 2013-02-19: qty 1

## 2013-02-19 MED ORDER — GLIPIZIDE 5 MG PO TABS: 5.0000 mg | ORAL_TABLET | Freq: Every day | ORAL | Status: DC

## 2013-02-19 MED ORDER — ALTEPLASE 2 MG IJ SOLR: 2.0000 mg | Freq: Once | INTRAMUSCULAR | Status: DC | PRN

## 2013-02-19 MED ORDER — FUROSEMIDE 80 MG PO TABS: 40.0000 mg | ORAL_TABLET | Freq: Two times a day (BID) | ORAL | Status: DC

## 2013-02-19 NOTE — Discharge Summary (Signed)
Physician Discharge Summary  Timothy Chan:[id number] DOB: May 14, 1951 DOA: 02/14/2013  PCP: Ignatius Specking., MD  Admit date: 02/14/2013 Discharge date: 02/19/2013  Time spent: Greater than 30 minutes  Recommendations for Outpatient Follow-up:  1. Followup with gastroenterology, Dr Karilyn Cota.  Discharge Diagnoses:  1. 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 2. Acute blood loss anemia secondary to #1. 3. End stage renal disease on hemodialysis. 4. Type 2 diabetes mellitus. 5. Moderate malnutrition.   Discharge Condition: Stable.  Diet recommendation: Carbohydrate modified diet.  Filed Weights   02/17/13 0500 02/17/13 1010 02/18/13 0500  Weight: 97.9 kg (215 lb 13.3 oz) 97.9 kg (215 lb 13.3 oz) 98 kg (216 lb 0.8 oz)    History of present illness:  This 62 year old man presented to the hospital with symptoms of black and coffee-ground emesis. Please see initial history as outlined below: HPI: Timothy Chan is a 62 y.o. male with a past medical history of recurrent episodes of GI bleeding, alcoholic liver disease, end-stage renal disease on hemodialysis, diabetes, who was in his usual state of health about 4 PM today when he started having vomiting, which was black in color. And, then he had a few episodes of coffee-ground emesis. He hasn't had a bowel movement in 3 days. Denies any blood in the stool. Has had poor appetite and decreased oral intake for the last many days. He does get acid reflux occasionally. Has had upper abdominal discomfort and at times. Denies any fever, but has had chills. No syncopal episodes recently. Does get short of breath with exertion, but none currently. His last endoscopy was last month, which showed GAVE and no varices were noted. Hasn't had dialysis in about 3 weeks. Apparently, has some social issues with getting to a dialysis center.  Hospital Course:  This patient was admitted and serial hemoglobins decreased. Therefore he required blood transfusion. He eventually  he ended up having 3 units blood transfusion to stabilize his hemoglobin. He has had no further evidence of active GI bleeding. He underwent EGD by Dr. Karilyn Cota, gastroenterology, who found gastric antral vascular ectasia, although there was no active bleeding. He also required hemodialysis during the time he was in the hospital. He has stabilized and is keen to go home. He did have some nonspecific symptoms of diarrhea but it does not seem to be significant. Stool cultures are pending, however. This can be followed up by Dr. Karilyn Cota. He is now stable for discharge.  Procedures:  EGD:   Findings:  Esophagus: Mucosa of the esophagus was normal. GE junction was unremarkable.  GEJ: 41 cm  Stomach: Stomach was empty and distended very well with insufflation. Folds in the proximal stomach are prominent. Mucosa at gastric body revealed mosaic pattern or snakeskin as well as patchy red areas. Numerous telangiectasia noted at gastric antrum. No active bleeding noted but two of these were coated specks of fresh blood to follow the channel was patent. No fundal varices identified.  Duodenum: Normal bulbar and post bulbar mucosa.  Therapeutic/Diagnostic Maneuvers Performed:  Several telangiectasias Ealer ablated with argon plasma coagulator. He still had many left.  Complications: None.  Impression:  No evidence of dislocation or gastric varices.  Portal gastropathy.  Multiple gastric antral vascular ectasia. Several of these lesions were treated.  Consultations:   gastroenterology, Dr. Karilyn Cota   Discharge Exam: Filed Vitals:   02/19/13 0500  BP: 149/66  Pulse: 128  Temp: 98.8 F (37.1 C)  Resp: 20    General:  he looks  chronically sick but not acutely unwell. He is not toxic or septic. He is somewhat pale.  Cardiovascular:  heart sounds are present without murmurs or added sounds.  Respiratory:  lung fields are clear. He is alert and orientated.  Discharge Instructions  Discharge Orders    Future Orders Complete By Expires     Diet - low sodium heart healthy  As directed     Increase activity slowly  As directed         Medication List         b complex-vitamin c-folic acid 0.8 MG Tabs tablet  Take 1 tablet by mouth at bedtime.     ciprofloxacin 500 MG tablet  Commonly known as:  CIPRO  Take 1 tablet (500 mg total) by mouth daily.     esomeprazole 40 MG capsule  Commonly known as:  NEXIUM  Take 1 capsule (40 mg total) by mouth daily before breakfast.     furosemide 80 MG tablet  Commonly known as:  LASIX  Take 0.5 tablets (40 mg total) by mouth 2 (two) times daily.     glipiZIDE 5 MG tablet  Commonly known as:  GLUCOTROL  Take 1 tablet (5 mg total) by mouth daily.     lactulose 10 GM/15ML solution  Commonly known as:  CHRONULAC  Take 20 g by mouth daily as needed (for constipation).     lanthanum 1000 MG chewable tablet  Commonly known as:  FOSRENOL  Chew 500-1,000 mg by mouth 4 (four) times daily -  with meals and at bedtime. Takes 1/2 tab (500 mg) with snacks and 1 tab (1,000 mg) with meals     megestrol 40 MG tablet  Commonly known as:  MEGACE  Take 1 tablet (40 mg total) by mouth 4 (four) times daily.     ondansetron 4 MG tablet  Commonly known as:  ZOFRAN  Take 4 mg by mouth daily as needed for nausea.     sucralfate 1 GM/10ML suspension  Commonly known as:  CARAFATE  Take 10 mLs (1 g total) by mouth 4 (four) times daily.     traMADol 50 MG tablet  Commonly known as:  ULTRAM  Take 50 mg by mouth 3 (three) times daily as needed for pain.     WESTCORT 0.2 %  Generic drug:  hydrocortisone valerate ointment  Apply 1 application topically every Monday, Wednesday, and Friday. For numbing prior to dialysis       Allergies  Allergen Reactions  . Tylenol (Acetaminophen) Other (See Comments)    cirrhosis  . Morphine And Related Itching       Follow-up Information   Follow up with REHMAN,NAJEEB U, MD. Schedule an appointment as soon as  possible for a visit in 1 week.   Contact information:   [address], [address] [place] [address] [phone]        The results of significant diagnostics from this hospitalization (including imaging, microbiology, ancillary and laboratory) are listed below for reference.    Significant Diagnostic Studies: X-ray Chest Pa Or Ap  02/14/2013   *RADIOLOGY REPORT*  Clinical Data: Weakness  CHEST - 1 VIEW  Comparison: 01/15/2013  Findings: The cardiac shadow is stable.  The lungs are well-aerated bilaterally.  Some chronic changes are seen in the right lung base. The osseous structures are within normal limits.  IMPRESSION: No acute abnormality noted.   Original Report Authenticated By: Alcide Clever, M.D.   Dg Abd 1 View  02/18/2013   *RADIOLOGY REPORT*  Clinical Data: 62 year old male with abdominal pain and diarrhea  ABDOMEN - 1 VIEW  Comparison: 01/05/2013 and prior radiographs dating back to 09/09/2010  Findings: Scattered nondistended gas-filled loops of small bowel and colon noted. There is no evidence of bowel obstruction. A TIPS is again identified. No acute bony abnormalities are identified.  IMPRESSION: Nonspecific nonobstructive bowel gas pattern.   Original Report Authenticated By: Harmon Pier, M.D.   Dg Chest Port 1 View  02/16/2013   *RADIOLOGY REPORT*  Clinical Data: 62 year old male status post PICC line placement.  PORTABLE CHEST - 1 VIEW  Comparison: 02/14/2013 and earlier.  Findings: Portable AP upright view 1253 hours.  Right upper extremity approach PICC line, tip projects about 2 cm below the carina at the lower SVC level.  Stable lung volumes.  No pneumothorax.  Stable to mildly increased asymmetric reticulonodular opacity most pronounced in the mid and right lower lung.  Stable mild right lung base veiling opacity. Stable cardiac size and mediastinal contours.  Visualized tracheal air column is within normal limits.  IMPRESSION: 1.  Right PICC line catheter, tip at the lower  SVC level. 2.  Stable to mildly increased right lung base opacity.   Original Report Authenticated By: Erskine Speed, M.D.    Microbiology: Recent Results (from the past 240 hour(s))  MRSA PCR SCREENING     Status: None   Collection Time    02/14/13  9:41 PM      Result Value Range Status   MRSA by PCR NEGATIVE  NEGATIVE Final   Comment:            The GeneXpert MRSA Assay (FDA     approved for NASAL specimens     only), is one component of a     comprehensive MRSA colonization     surveillance program. It is not     intended to diagnose MRSA     infection nor to guide or     monitor treatment for     MRSA infections.     Labs: Basic Metabolic Panel:  Recent Labs Lab 02/15/13 0443 02/16/13 0452 02/17/13 0506 02/18/13 0459 02/19/13 0452  NA 140 139 136 134* 135  K 4.6 4.2 3.6 3.5 3.5  CL 110 104 102 99 101  CO2 17* 25 23 26 23   GLUCOSE 149* 108* 197* 273* 202*  BUN 92* 43* 48* 28* 38*  CREATININE 12.05* 6.92* 7.73* 5.06* 5.98*  CALCIUM 8.8 8.2* 7.9* 7.9* 8.4  PHOS  --  4.6  --   --   --    Liver Function Tests:  Recent Labs Lab 02/15/13 0443  AST 41*  ALT 13  ALKPHOS 89  BILITOT 1.2  PROT 5.7*  ALBUMIN 1.7*     CBC:  Recent Labs Lab 02/14/13 1856 02/15/13 0443 02/16/13 0452 02/17/13 0506 02/18/13 0459 02/19/13 0452  WBC 8.1 6.4 6.6 5.8 7.6 8.8  NEUTROABS 4.6  --   --   --   --   --   HGB 6.0* 6.3* 7.7* 9.1* 9.0* 8.5*  HCT 18.6* 19.5* 23.2* 26.9* 26.5* 25.3*  MCV 92.5 92.4 92.4 90.3 91.4 90.7  PLT 177 149* 118* 98* 74* 85*     BNP: BNP (last 3 results)  Recent Labs  11/21/12 1145  PROBNP 1167.0*   CBG:  Recent Labs Lab 02/17/13 1625 02/17/13 2051 02/18/13 0722 02/18/13 1115 02/18/13 1614  GLUCAP 155* 266* 219* 209* 283*  SignedWilson Singer  [facility] 02/19/2013, 11:29 AM

## 2013-02-19 NOTE — Progress Notes (Signed)
Patient discharged home, son here to pick up.  Pt instructed on changes to medications and to continue dialysis as outpatient.  Follow up with PCP in place for next Monday.  PICC removed - WNL.  Instructed to leave pressure dressing on for 24 hours.  Patient verbalizes understanding.  Patient left floor in stable condition by WC with NT.

## 2013-02-19 NOTE — Telephone Encounter (Signed)
Timothy Chan was told by his father that Dr. Karilyn Cota needed to talk with him. His return phone number is [phone].

## 2013-02-19 NOTE — Telephone Encounter (Signed)
Per Dr.Rehman , he didn't tell Mr. Herrod that. When he talked with him he ask if Ray was there. Per Dr.Rehman if Ray calls back he will talk with him.

## 2013-02-19 NOTE — Clinical Social Work Note (Signed)
Discussed issues w mobility limiting patient's ability to get from car to dialysis center, CSW will follow up w [facility] administration today, spoke w them Friday and they were seeing if they had any flexibility to assist patient from car to center.  Patient says he has been trying to get motorized wheelchair, which would also solve the problem of mobility.  Patient says he has not received paperwork needed from PCP, RN CM notified and will pursue.  Santa Genera, LCSW Clinical Social Worker [phone])

## 2013-02-19 NOTE — Progress Notes (Signed)
Subjective: Interval History: has no complaint of nausea or vomiting. History that is getting better but Complains of abdominal pain Objective: Vital signs in last 24 hours: Temp:  [98.7 F (37.1 C)-99.1 F (37.3 C)] 98.8 F (37.1 C) (08/11 0500) Pulse Rate:  [115-128] 128 (08/11 0500) Resp:  [18-20] 20 (08/11 0500) BP: (129-151)/(66-82) 149/66 mmHg (08/11 0500) SpO2:  [96 %-99 %] 99 % (08/11 0500) Weight change:   Intake/Output from previous day: 08/10 0701 - 08/11 0700 In: 1280 [P.O.:1080; I.V.:200] Out: 200 [Urine:200] Intake/Output this shift:    General appearance: alert, cooperative and no distress Resp: clear to auscultation bilaterally Cardio: regular rate and rhythm, S1, S2 normal, no murmur, click, rub or gallop GI: soft, non-tender; bowel sounds normal; no masses,  no organomegaly Extremities: extremities normal, atraumatic, no cyanosis or edema  Lab Results:  Recent Labs  02/18/13 0459 02/19/13 0452  WBC 7.6 8.8  HGB 9.0* 8.5*  HCT 26.5* 25.3*  PLT 74* 85*   BMET:   Recent Labs  02/18/13 0459 02/19/13 0452  NA 134* 135  K 3.5 3.5  CL 99 101  CO2 26 23  GLUCOSE 273* 202*  BUN 28* 38*  CREATININE 5.06* 5.98*  CALCIUM 7.9* 8.4   No results found for this basename: PTH,  in the last 72 hours Iron Studies: No results found for this basename: IRON, TIBC, TRANSFERRIN, FERRITIN,  in the last 72 hours  Studies/Results: Dg Abd 1 View  02/18/2013   *RADIOLOGY REPORT*  Clinical Data: 62 year old male with abdominal pain and diarrhea  ABDOMEN - 1 VIEW  Comparison: 01/05/2013 and prior radiographs dating back to 09/09/2010  Findings: Scattered nondistended gas-filled loops of small bowel and colon noted. There is no evidence of bowel obstruction. A TIPS is again identified. No acute bony abnormalities are identified.  IMPRESSION: Nonspecific nonobstructive bowel gas pattern.   Original Report Authenticated By: Harmon Pier, M.D.    I have reviewed the  patient's current medications.  Assessment/Plan: Problem #1 end-stage renal disease is status post hemodialysis on Sate day his BUN is 38 and  creatinine 5.96 with potassium of 3.5 which seems to be better. Problem #2 anemia his hemoglobin is 8.5 and  hematocrit 25.3 ,a combination of GI bleeding and anemia of chronic disease. Problem #3 history of diabetes Problem #4 history of liver cirrhosis  Problem #5 metabolic bone disease his calcium and phosphorus is was in acceptable range. Problem #6 chronic abdominal and back pain. Problem #7 history of poor appetite and nausea most likely secondary to uremia presently feels better after he received hemodialysis. Plan: We'll continue with present treatment.            For dialysis in am  If discharged patient will go to his regular dialysis unit    LOS: 5 days   Marionette Meskill S 02/19/2013,7:37 AM

## 2013-02-19 NOTE — Progress Notes (Signed)
Inpatient Diabetes Program Recommendations  AACE/ADA: New Consensus Statement on Inpatient Glycemic Control (2013)  Target Ranges:  Prepandial:   less than 140 mg/dL      Peak postprandial:   less than 180 mg/dL (1-2 hours)      Critically ill patients:  140 - 180 mg/dL  Results for Timothy Chan, Timothy Chan (MRN [id number]) as of 02/19/2013 08:38  Ref. Range 02/18/2013 07:22 02/18/2013 11:15 02/18/2013 16:14  Glucose-Capillary Latest Range: 70-99 mg/dL 147 (H) 829 (H) 562 (H)   Results for Timothy Chan, Timothy Chan (MRN [id number]) as of 02/19/2013 08:38  Ref. Range 02/19/2013 04:52  Glucose Latest Range: 70-99 mg/dL 696 (H)    Inpatient Diabetes Program Recommendations Insulin - Basal: May want to consider ordering low dose basal insulin; recommend starting with Levemir 5 units QHS. Correction (SSI): Please conisder increrasing Novolog correction to moderate scale.  Note: Patient has a history of diabetes and takes Glipizide 5mg  BID at home for diabetes management.  Currently, patient is ordered to receive Novolog 0-9 units AC and Novolog 0-5 units HS for inpatient glycemic control.  Blood glucose over the past 24 hours has ranged from 202-283 mg/dl and fasting blood glucose this morning was 202 mg/dl.  May want to consider ordering low dose basal insulin (recommend Levemir 5 units QHS) and increasing insulin correction to moderate scale.  Will continue to follow.  Thanks, Orlando Penner, RN, MSN, CCRN Diabetes Coordinator Inpatient Diabetes Program [phone]

## 2013-02-19 NOTE — Procedures (Signed)
   HEMODIALYSIS TREATMENT NOTE:  3.5 hour HD session ordered.  Pt anxious and agitated at onset.  Alprazolam 5 mg given per x1 MD order, but with no relief.  Pt restless, tossing in bed.  "I feel like I want to jump out of my skin."  Attempts to assuage anxiety were unsuccessful.  N/V (about 200cc total) brown emesis.  Pt c/o "acid reflux".  Had received Protonix at 1100.  Requested to terminate HD early after only 2 hours.  Net UF 1L.  BP stable, albeit taken in LLE due to PICC line in right upper arm.  All blood was reinfused.  Hemostasis was achieved within 12 minutes.  Report given to Lindaann Slough, RN.  Dr Kristian Covey notified of early tx termination at pt's request.  Marylene Land L. Wandy Bossler, RN, CDN

## 2013-02-20 LAB — TYPE AND SCREEN
ABO/RH(D): B NEG
Antibody Screen: NEGATIVE
Unit division: 0
Unit division: 0

## 2013-02-20 LAB — GLUCOSE, CAPILLARY

## 2013-02-22 ENCOUNTER — Telehealth (INDEPENDENT_AMBULATORY_CARE_PROVIDER_SITE_OTHER): Payer: Self-pay | Admitting: *Deleted

## 2013-02-22 LAB — STOOL CULTURE

## 2013-02-22 NOTE — Telephone Encounter (Signed)
FYI -- Timothy Chan left a message yesterday, 02/21/13, after our phone cut off for the day at 4:30 pm. Timothy Chan said he just got out of the hospital on Monday, 02/19/13, and still passing blood. He is so weak that he is not able to get in his wheelchair and to please return his call. ILupita Leash, have tried to call the number left this morning, 02/22/13, at 9 am and then again at 10:20 am. The phone goes straight to a message stating there is no voice mail set up at this time. The number left to call is [phone].

## 2013-02-22 NOTE — Telephone Encounter (Signed)
This was reviewed. I will forward it to Dr.Rehman for review. If the patient is this weak,ect. He may to need to go to ED for further evaluation.

## 2013-02-22 NOTE — Telephone Encounter (Signed)
No answer at [phone]. If patient is feeling weak he will need to go to emergency room.

## 2013-02-23 ENCOUNTER — Emergency Department (HOSPITAL_COMMUNITY): Payer: Medicare Other

## 2013-02-23 ENCOUNTER — Encounter (HOSPITAL_COMMUNITY): Payer: Self-pay | Admitting: Emergency Medicine

## 2013-02-23 ENCOUNTER — Inpatient Hospital Stay (HOSPITAL_COMMUNITY)
Admission: EM | Admit: 2013-02-23 | Discharge: 2013-02-27 | DRG: 377 | Disposition: A | Discharge status: Skilled Nursing Facility | Payer: Medicare Other | Attending: Internal Medicine | Admitting: Internal Medicine

## 2013-02-23 DIAGNOSIS — Z91158 Patient's noncompliance with renal dialysis for other reason: Secondary | ICD-10-CM

## 2013-02-23 DIAGNOSIS — I959 Hypotension, unspecified: Secondary | ICD-10-CM | POA: Diagnosis present

## 2013-02-23 DIAGNOSIS — D649 Anemia, unspecified: Secondary | ICD-10-CM

## 2013-02-23 DIAGNOSIS — E118 Type 2 diabetes mellitus with unspecified complications: Secondary | ICD-10-CM

## 2013-02-23 DIAGNOSIS — I120 Hypertensive chronic kidney disease with stage 5 chronic kidney disease or end stage renal disease: Secondary | ICD-10-CM | POA: Diagnosis present

## 2013-02-23 DIAGNOSIS — N039 Chronic nephritic syndrome with unspecified morphologic changes: Secondary | ICD-10-CM | POA: Diagnosis present

## 2013-02-23 DIAGNOSIS — Z79899 Other long term (current) drug therapy: Secondary | ICD-10-CM

## 2013-02-23 DIAGNOSIS — D631 Anemia in chronic kidney disease: Secondary | ICD-10-CM | POA: Diagnosis present

## 2013-02-23 DIAGNOSIS — Z992 Dependence on renal dialysis: Secondary | ICD-10-CM

## 2013-02-23 DIAGNOSIS — K31811 Angiodysplasia of stomach and duodenum with bleeding: Principal | ICD-10-CM | POA: Diagnosis present

## 2013-02-23 DIAGNOSIS — E1165 Type 2 diabetes mellitus with hyperglycemia: Secondary | ICD-10-CM | POA: Diagnosis present

## 2013-02-23 DIAGNOSIS — K7030 Alcoholic cirrhosis of liver without ascites: Secondary | ICD-10-CM | POA: Diagnosis present

## 2013-02-23 DIAGNOSIS — K7290 Hepatic failure, unspecified without coma: Secondary | ICD-10-CM | POA: Diagnosis present

## 2013-02-23 DIAGNOSIS — I8510 Secondary esophageal varices without bleeding: Secondary | ICD-10-CM | POA: Diagnosis present

## 2013-02-23 DIAGNOSIS — K7682 Hepatic encephalopathy: Secondary | ICD-10-CM | POA: Diagnosis present

## 2013-02-23 DIAGNOSIS — D62 Acute posthemorrhagic anemia: Secondary | ICD-10-CM

## 2013-02-23 DIAGNOSIS — Z9115 Patient's noncompliance with renal dialysis: Secondary | ICD-10-CM

## 2013-02-23 DIAGNOSIS — D696 Thrombocytopenia, unspecified: Secondary | ICD-10-CM | POA: Diagnosis present

## 2013-02-23 DIAGNOSIS — K769 Liver disease, unspecified: Secondary | ICD-10-CM | POA: Diagnosis present

## 2013-02-23 DIAGNOSIS — R188 Other ascites: Secondary | ICD-10-CM | POA: Diagnosis present

## 2013-02-23 DIAGNOSIS — K31819 Angiodysplasia of stomach and duodenum without bleeding: Secondary | ICD-10-CM | POA: Diagnosis present

## 2013-02-23 DIAGNOSIS — K922 Gastrointestinal hemorrhage, unspecified: Secondary | ICD-10-CM

## 2013-02-23 DIAGNOSIS — N186 End stage renal disease: Secondary | ICD-10-CM | POA: Diagnosis present

## 2013-02-23 DIAGNOSIS — IMO0001 Reserved for inherently not codable concepts without codable children: Secondary | ICD-10-CM | POA: Diagnosis present

## 2013-02-23 DIAGNOSIS — Z9181 History of falling: Secondary | ICD-10-CM

## 2013-02-23 DIAGNOSIS — F1020 Alcohol dependence, uncomplicated: Secondary | ICD-10-CM | POA: Diagnosis present

## 2013-02-23 DIAGNOSIS — K219 Gastro-esophageal reflux disease without esophagitis: Secondary | ICD-10-CM | POA: Diagnosis present

## 2013-02-23 DIAGNOSIS — Z87891 Personal history of nicotine dependence: Secondary | ICD-10-CM

## 2013-02-23 LAB — CBC WITH DIFFERENTIAL/PLATELET
Basophils Relative: 1 % (ref 0–1)
Eosinophils Absolute: 0.7 10*3/uL (ref 0.0–0.7)
HCT: 22.8 % — ABNORMAL LOW (ref 39.0–52.0)
Hemoglobin: 7.5 g/dL — ABNORMAL LOW (ref 13.0–17.0)
Lymphs Abs: 2.7 10*3/uL (ref 0.7–4.0)
MCH: 30.5 pg (ref 26.0–34.0)
MCHC: 32.9 g/dL (ref 30.0–36.0)
Monocytes Absolute: 1.3 10*3/uL — ABNORMAL HIGH (ref 0.1–1.0)
Monocytes Relative: 12 % (ref 3–12)
Neutro Abs: 6 10*3/uL (ref 1.7–7.7)
Neutrophils Relative %: 55 % (ref 43–77)
RBC: 2.46 MIL/uL — ABNORMAL LOW (ref 4.22–5.81)

## 2013-02-23 LAB — BASIC METABOLIC PANEL
BUN: 72 mg/dL — ABNORMAL HIGH (ref 6–23)
Chloride: 97 mEq/L (ref 96–112)
Creatinine, Ser: 7.68 mg/dL — ABNORMAL HIGH (ref 0.50–1.35)
Glucose, Bld: 128 mg/dL — ABNORMAL HIGH (ref 70–99)
Potassium: 3.5 mEq/L (ref 3.5–5.1)

## 2013-02-23 LAB — CBC
Hemoglobin: 7.4 g/dL — ABNORMAL LOW (ref 13.0–17.0)
RBC: 2.39 MIL/uL — ABNORMAL LOW (ref 4.22–5.81)
WBC: 9.7 10*3/uL (ref 4.0–10.5)

## 2013-02-23 LAB — HEPATIC FUNCTION PANEL
ALT: 23 U/L (ref 0–53)
Bilirubin, Direct: 0.3 mg/dL (ref 0.0–0.3)
Indirect Bilirubin: 0.4 mg/dL (ref 0.3–0.9)
Total Protein: 5.7 g/dL — ABNORMAL LOW (ref 6.0–8.3)

## 2013-02-23 LAB — PROTIME-INR
INR: 1.16 (ref 0.00–1.49)
Prothrombin Time: 14.6 s (ref 11.6–15.2)

## 2013-02-23 LAB — GLUCOSE, CAPILLARY: Glucose-Capillary: 100 mg/dL — ABNORMAL HIGH (ref 70–99)

## 2013-02-23 LAB — APTT: aPTT: 26 s (ref 24–37)

## 2013-02-23 LAB — AMMONIA: Ammonia: 37 umol/L (ref 11–60)

## 2013-02-23 LAB — OCCULT BLOOD, POC DEVICE: Fecal Occult Bld: POSITIVE — AB

## 2013-02-23 MED ORDER — SODIUM CHLORIDE 0.9 % IJ SOLN
3.0000 mL | Freq: Two times a day (BID) | INTRAMUSCULAR | Status: DC
  Administered 2013-02-23 – 2013-02-27 (×6): 3 mL via INTRAVENOUS

## 2013-02-23 MED ORDER — ONDANSETRON HCL 4 MG/2ML IJ SOLN
4.0000 mg | Freq: Four times a day (QID) | INTRAMUSCULAR | Status: DC | PRN
  Administered 2013-02-23: 4 mg via INTRAVENOUS
  Filled 2013-02-23: qty 2

## 2013-02-23 MED ORDER — SODIUM CHLORIDE 0.9 % IV SOLN
INTRAVENOUS | Status: DC
  Administered 2013-02-23: 12:00:00 via INTRAVENOUS

## 2013-02-23 MED ORDER — TRAMADOL HCL 50 MG PO TABS
50.0000 mg | ORAL_TABLET | Freq: Three times a day (TID) | ORAL | Status: DC | PRN
  Administered 2013-02-23 – 2013-02-27 (×3): 50 mg via ORAL
  Filled 2013-02-23 (×3): qty 1

## 2013-02-23 MED ORDER — PANTOPRAZOLE SODIUM 40 MG IV SOLR: 40.0000 mg | Freq: Two times a day (BID) | INTRAVENOUS | Status: DC

## 2013-02-23 MED ORDER — MEGESTROL ACETATE 40 MG PO TABS
40.0000 mg | ORAL_TABLET | Freq: Four times a day (QID) | ORAL | Status: DC
  Administered 2013-02-23 – 2013-02-25 (×9): 40 mg via ORAL
  Filled 2013-02-23 (×20): qty 1

## 2013-02-23 MED ORDER — ALBUTEROL SULFATE (5 MG/ML) 0.5% IN NEBU: 2.5000 mg | INHALATION_SOLUTION | RESPIRATORY_TRACT | Status: DC | PRN

## 2013-02-23 MED ORDER — SODIUM CHLORIDE 0.9 % IV SOLN
80.0000 mg | Freq: Once | INTRAVENOUS | Status: DC
  Filled 2013-02-23: qty 80

## 2013-02-23 MED ORDER — HYDROCORTISONE VALERATE 0.2 % EX CREA
TOPICAL_CREAM | CUTANEOUS | Status: DC
  Administered 2013-02-23: 18:00:00 via TOPICAL
  Filled 2013-02-23: qty 15

## 2013-02-23 MED ORDER — EPOETIN ALFA 10000 UNIT/ML IJ SOLN
10000.0000 [IU] | INTRAMUSCULAR | Status: DC
  Filled 2013-02-23: qty 1

## 2013-02-23 MED ORDER — SODIUM CHLORIDE 0.9 % IV SOLN
8.0000 mg/h | INTRAVENOUS | Status: DC
  Administered 2013-02-23 – 2013-02-24 (×2): 8 mg/h via INTRAVENOUS
  Filled 2013-02-23 (×9): qty 80

## 2013-02-23 MED ORDER — INSULIN ASPART 100 UNIT/ML ~~LOC~~ SOLN
0.0000 [IU] | SUBCUTANEOUS | Status: DC
  Administered 2013-02-24 – 2013-02-25 (×5): 2 [IU] via SUBCUTANEOUS
  Administered 2013-02-26 (×4): 1 [IU] via SUBCUTANEOUS
  Administered 2013-02-27: 2 [IU] via SUBCUTANEOUS
  Administered 2013-02-27: 1 [IU] via SUBCUTANEOUS

## 2013-02-23 MED ORDER — SUCRALFATE 1 GM/10ML PO SUSP
1.0000 g | Freq: Four times a day (QID) | ORAL | Status: DC
  Administered 2013-02-23 – 2013-02-25 (×7): 1 g via ORAL
  Filled 2013-02-23 (×7): qty 10

## 2013-02-23 MED ORDER — LACTULOSE 10 GM/15ML PO SOLN: 20.0000 g | Freq: Every day | ORAL | Status: DC | PRN

## 2013-02-23 MED ORDER — ONDANSETRON HCL 4 MG PO TABS
4.0000 mg | ORAL_TABLET | Freq: Four times a day (QID) | ORAL | Status: DC | PRN
  Administered 2013-02-24: 4 mg via ORAL
  Filled 2013-02-23: qty 1

## 2013-02-23 MED ORDER — HYDROCORTISONE VALERATE 0.2 % EX OINT
1.0000 "application " | TOPICAL_OINTMENT | CUTANEOUS | Status: DC
  Filled 2013-02-23: qty 15

## 2013-02-23 MED ORDER — CIPROFLOXACIN HCL 250 MG PO TABS
500.0000 mg | ORAL_TABLET | Freq: Every day | ORAL | Status: DC
  Administered 2013-02-23 – 2013-02-25 (×3): 500 mg via ORAL
  Filled 2013-02-23 (×3): qty 2

## 2013-02-23 MED ORDER — FUROSEMIDE 80 MG PO TABS
80.0000 mg | ORAL_TABLET | Freq: Two times a day (BID) | ORAL | Status: DC
  Administered 2013-02-23 – 2013-02-25 (×5): 80 mg via ORAL
  Filled 2013-02-23 (×5): qty 1

## 2013-02-23 MED ORDER — SODIUM CHLORIDE 0.9 % IV SOLN
INTRAVENOUS | Status: AC
  Administered 2013-02-23: 16:00:00 via INTRAVENOUS

## 2013-02-23 NOTE — ED Notes (Signed)
Patient arrives via EMS from home with c/o GI bleeding. Recent admission for same. Patient received most of his dialysis Monday (2.5 hours) before becoming "sick". Patient reports he has no been able to go back to dialysis Wednesday or today. Pale. Alert/oriented x 4.

## 2013-02-23 NOTE — ED Provider Notes (Addendum)
CSN: [id number]     Arrival date & time 02/23/13  7829 History  This chart was scribed for Timothy Jakes, MD by Timothy Chan, ED Scribe. This patient was seen in room [facility]04/[facility]04 and the patient's care was started at 11:28 AM.   Chief Complaint  Patient presents with  . GI Bleeding    The history is provided by the patient. No language interpreter was used.    HPI Comments: Timothy Chan is a 62 y.o. male brought in by ambulance from home, who presents to the Emergency Department complaining of one week of constant melena described as black and tarry stools with associated RUQ abdominal pain. He was recently admitted for the same on 02/14/13 and given 3 blood transfusions. He states that he had an EGD performed by Dr. Karilyn Chan and told that he has had a "stomach" bleed. He states that he was vomiting up bright red blood last week but denies this symptom currently. He is a Mon, Wed and Fri dialysis pt. He states that he received most of his dialysis treated Monday (2.5 hrs) but ended it early due to feeling sick. He missed his appointment on Wednesday and today. He states that he developed fatigue, bilateral leg swelling and SOB with exertion over the past week as well.   PCP is Dr. Sherril Chan  Past Medical History  Diagnosis Date  . Rectal bleeding   . Dialysis care   . GI bleed   . Hypertension   . Esophageal varices   . Alcoholic liver disease   . Hepatic encephalopathy   . Ascites   . Diabetes mellitus   . Detached retina   . Renal disorder   . Pneumonia    Past Surgical History  Procedure Laterality Date  . Esophagogastroduodenoscopy  12/31/2010    EGD [facility] THERAPY  . Esophagogastroduodenoscopy  05/31/2012E    GD [facility] ABLATION  . Small bowel givens  12/01/2010  . Colonoscopy  09/07/2010  . Esophagogastroduodenoscopy  09/05/2010    OUTLAW  . Lung surgery  6/11    [place]  . Esophagogastroduodenoscopy  03/26/2011    Procedure: ESOPHAGOGASTRODUODENOSCOPY (EGD);   Surgeon: Timothy Hippo, MD;  [facility];  Service: Endoscopy;  Laterality: N/A;  8:30 am  . Hot hemostasis  03/26/2011    Procedure: HOT HEMOSTASIS (ARGON PLASMA COAGULATION/BICAP);  Surgeon: Timothy Hippo, MD;  [facility];  Service: Endoscopy;  Laterality: N/A;  . Stent in liver    . Esophagogastroduodenoscopy N/A 10/24/2012    Procedure: ESOPHAGOGASTRODUODENOSCOPY (EGD);  Surgeon: Timothy Hippo, MD;  [facility];  Service: Endoscopy;  Laterality: N/A;  . Esophagogastroduodenoscopy N/A 11/22/2012    Procedure: ESOPHAGOGASTRODUODENOSCOPY (EGD);  Surgeon: Timothy Elizabeth, MD;  Location: [facility] ENDOSCOPY;  Service: Endoscopy;  Laterality: N/A;  . Paracentesis    . Esophagogastroduodenoscopy N/A 12/21/2012    Procedure: ESOPHAGOGASTRODUODENOSCOPY (EGD);  Surgeon: Timothy Hippo, MD;  [facility];  Service: Endoscopy;  Laterality: N/A;  250-moved to 155 Ann to notify pt  . Hot hemostasis N/A 12/21/2012    Procedure: HOT HEMOSTASIS (ARGON PLASMA COAGULATION/BICAP);  Surgeon: Timothy Hippo, MD;  [facility];  Service: Endoscopy;  Laterality: N/A;  . Esophagogastroduodenoscopy N/A 01/25/2013    Procedure: ESOPHAGOGASTRODUODENOSCOPY (EGD);  Surgeon: Timothy Hippo, MD;  [facility];  Service: Endoscopy;  Laterality: N/A;  100  . Hot hemostasis N/A 01/25/2013    Procedure: HOT HEMOSTASIS (ARGON PLASMA COAGULATION/BICAP);  Surgeon: Timothy Hippo, MD;  [facility];  Service: Endoscopy;  Laterality: N/A;  . Esophagogastroduodenoscopy N/A 02/16/2013    Procedure: ESOPHAGOGASTRODUODENOSCOPY (EGD);  Surgeon: Timothy Hippo, MD;  [facility];  Service: Endoscopy;  Laterality: N/A;   No family history on file. History  Substance Use Topics  . Smoking status: Former Smoker -- 1.00 packs/day for 30 years    Types: Cigarettes    Quit date: 08/11/2011  . Smokeless tobacco: Former Neurosurgeon    Quit date: 05/12/2012  .  Alcohol Use: No     Comment: denies-quit 2000  pt lives alone  Review of Systems  Constitutional: Positive for fatigue. Negative for fever, chills and appetite change.  HENT: Negative for congestion, sore throat and neck pain.   Eyes: Positive for visual disturbance (blurred vision).  Respiratory: Positive for cough and shortness of breath (with exertion ).   Cardiovascular: Positive for chest pain (brief sharp pains with movement) and leg swelling (bilaterally ).  Gastrointestinal: Positive for abdominal pain and blood in stool. Negative for nausea, vomiting and diarrhea.  Genitourinary: Negative for dysuria and hematuria.  Musculoskeletal: Positive for back pain (chronic, no changes).  Skin: Negative for rash.  Neurological: Negative for headaches.  Hematological: Bruises/bleeds easily (h/o cirrhosis).  Psychiatric/Behavioral: Negative for confusion.    Allergies  Tylenol and Morphine and related  Home Medications   Current Outpatient Rx  Name  Route  Sig  Dispense  Refill  . b complex-vitamin c-folic acid (NEPHRO-VITE) 0.8 MG TABS   Oral   Take 1 tablet by mouth at bedtime.   30 tablet   0   . ciprofloxacin (CIPRO) 500 MG tablet   Oral   Take 1 tablet (500 mg total) by mouth daily.   30 tablet   3     Jaycee needs to be taking this medication daily   . esomeprazole (NEXIUM) 40 MG capsule   Oral   Take 1 capsule (40 mg total) by mouth daily before breakfast.   30 capsule   5   . furosemide (LASIX) 80 MG tablet   Oral   Take 80 mg by mouth 2 (two) times daily.         . hydrocortisone valerate ointment (WESTCORT) 0.2 %   Topical   Apply 1 application topically every Monday, Wednesday, and Friday. For numbing prior to dialysis         . lactulose (CHRONULAC) 10 GM/15ML solution   Oral   Take 20 g by mouth daily as needed (for constipation).         . megestrol (MEGACE) 40 MG tablet   Oral   Take 1 tablet (40 mg total) by mouth 4 (four) times daily.    120 tablet   4   . ondansetron (ZOFRAN) 4 MG tablet   Oral   Take 4 mg by mouth daily as needed for nausea.          . sucralfate (CARAFATE) 1 GM/10ML suspension   Oral   Take 10 mLs (1 g total) by mouth 4 (four) times daily.   420 mL   0   . traMADol (ULTRAM) 50 MG tablet   Oral   Take 50 mg by mouth 3 (three) times daily as needed for pain.           Triage Vitals: BP 144/71  Pulse 116  Temp(Src) 97.3 F (36.3 C) (Oral)  Resp 20  SpO2 98%  Physical Exam  Nursing note and vitals reviewed. Constitutional: He is oriented to person, place, and time. He appears well-developed and well-nourished. No distress.  HENT:  Head: Normocephalic and atraumatic.  Mouth/Throat: Oropharynx is clear and moist.  Eyes: Conjunctivae and EOM are normal. Pupils are equal, round, and reactive to light.  Sclera are clear  Neck: Neck supple. No tracheal deviation present.  Cardiovascular: Regular rhythm.  Tachycardia present.   No murmur heard. Pulses:      Dorsalis pedis pulses are 2+ on the right side, and 2+ on the left side.  Pulmonary/Chest: Effort normal and breath sounds normal. No respiratory distress. He has no wheezes.  Abdominal: Soft. Bowel sounds are normal. He exhibits no distension. There is no tenderness.  Liver is enlarged with mild tenderness  Genitourinary:  hemoccult positive black, tarry stool  Musculoskeletal: Normal range of motion. He exhibits edema (trace ankle swelling bilaterally ).  Lymphadenopathy:    He has no cervical adenopathy.  Neurological: He is alert and oriented to person, place, and time. No cranial nerve deficit.  Pt able to move both sets of fingers and toes  Skin: Skin is warm and dry. No rash noted.  Psychiatric: He has a normal mood and affect. His behavior is normal.    ED Course   DIAGNOSTIC STUDIES: Oxygen Saturation is 98% on room air, normal by my interpretation.    COORDINATION OF CARE: 11:33 AM-Discussed treatment plan which  includes CXR, CBC panel, BMP and lipase with pt at bedside and pt agreed to plan. Stool sample obtained from prior BM pt had. Advised pt that admission is probable. Pt is agreeable to admission.   Procedures (including critical care time)  Labs Reviewed  CBC WITH DIFFERENTIAL - Abnormal; Notable for the following:    WBC 10.9 (*)    RBC 2.46 (*)    Hemoglobin 7.5 (*)    HCT 22.8 (*)    RDW 16.9 (*)    Platelets 131 (*)    Monocytes Absolute 1.3 (*)    Eosinophils Relative 6 (*)    All other components within normal limits  BASIC METABOLIC PANEL - Abnormal; Notable for the following:    Sodium 133 (*)    Glucose, Bld 128 (*)    BUN 72 (*)    Creatinine, Ser 7.68 (*)    GFR calc non Af Amer 7 (*)    GFR calc Af Amer 8 (*)    All other components within normal limits  LIPASE, BLOOD - Abnormal; Notable for the following:    Lipase 94 (*)    All other components within normal limits  HEPATIC FUNCTION PANEL - Abnormal; Notable for the following:    Total Protein 5.7 (*)    Albumin 1.6 (*)    AST 62 (*)    All other components within normal limits  OCCULT BLOOD, POC DEVICE - Abnormal; Notable for the following:    Fecal Occult Bld POSITIVE (*)    All other components within normal limits  AMMONIA  PROTIME-INR  APTT  TYPE AND SCREEN  PREPARE RBC (CROSSMATCH)   Results for orders placed during the hospital encounter of 02/23/13  CBC WITH DIFFERENTIAL      Result Value Range   WBC 10.9 (*) 4.0 - 10.5 K/uL   RBC 2.46 (*) 4.22 - 5.81 MIL/uL   Hemoglobin 7.5 (*) 13.0 - 17.0 g/dL   HCT 16.1 (*) 09.6 - 04.5 %   MCV 92.7  78.0 - 100.0 fL   MCH  30.5  26.0 - 34.0 pg   MCHC 32.9  30.0 - 36.0 g/dL   RDW 16.1 (*) 09.6 - 04.5 %   Platelets 131 (*) 150 - 400 K/uL   Neutrophils Relative % 55  43 - 77 %   Neutro Abs 6.0  1.7 - 7.7 K/uL   Lymphocytes Relative 25  12 - 46 %   Lymphs Abs 2.7  0.7 - 4.0 K/uL   Monocytes Relative 12  3 - 12 %   Monocytes Absolute 1.3 (*) 0.1 - 1.0 K/uL    Eosinophils Relative 6 (*) 0 - 5 %   Eosinophils Absolute 0.7  0.0 - 0.7 K/uL   Basophils Relative 1  0 - 1 %   Basophils Absolute 0.1  0.0 - 0.1 K/uL  BASIC METABOLIC PANEL      Result Value Range   Sodium 133 (*) 135 - 145 mEq/L   Potassium 3.5  3.5 - 5.1 mEq/L   Chloride 97  96 - 112 mEq/L   CO2 21  19 - 32 mEq/L   Glucose, Bld 128 (*) 70 - 99 mg/dL   BUN 72 (*) 6 - 23 mg/dL   Creatinine, Ser 4.09 (*) 0.50 - 1.35 mg/dL   Calcium 8.6  8.4 - 81.1 mg/dL   GFR calc non Af Amer 7 (*) >90 mL/min   GFR calc Af Amer 8 (*) >90 mL/min  AMMONIA      Result Value Range   Ammonia 37  11 - 60 umol/L  LIPASE, BLOOD      Result Value Range   Lipase 94 (*) 11 - 59 U/L  HEPATIC FUNCTION PANEL      Result Value Range   Total Protein 5.7 (*) 6.0 - 8.3 g/dL   Albumin 1.6 (*) 3.5 - 5.2 g/dL   AST 62 (*) 0 - 37 U/L   ALT 23  0 - 53 U/L   Alkaline Phosphatase 113  39 - 117 U/L   Total Bilirubin 0.7  0.3 - 1.2 mg/dL   Bilirubin, Direct 0.3  0.0 - 0.3 mg/dL   Indirect Bilirubin 0.4  0.3 - 0.9 mg/dL  PROTIME-INR      Result Value Range   Prothrombin Time 14.6  11.6 - 15.2 seconds   INR 1.16  0.00 - 1.49  APTT      Result Value Range   aPTT 26  24 - 37 seconds  OCCULT BLOOD, POC DEVICE      Result Value Range   Fecal Occult Bld POSITIVE (*) NEGATIVE  TYPE AND SCREEN      Result Value Range   ABO/RH(D) B NEG     Antibody Screen NEG     Sample Expiration 02/26/2013    PREPARE RBC (CROSSMATCH)      Result Value Range   Order Confirmation ORDER PROCESSED BY BLOOD BANK      Stool was heme positive black and tarry in nature. Have arranged for blood transfusion since chest x-rays negative for any pulmonary edema. Surprised the patient did not have pulmonary edema since he hasn't really been thoroughly dialyzed since last week he went partial dialysis on Monday. We'll arrange for a [facility]. is Dr. Dionicia Abler as probably bleeding in the upper GI area again.  Dg Chest Portable 1 View  02/23/2013    *RADIOLOGY REPORT*  Clinical Data: Gastrointestinal bleeding  PORTABLE CHEST - 1 VIEW  Comparison: 02/16/2013  Findings: Cardiac silhouette upper normal in size.  Vascular pattern  within normal limits.  Left lung is clear.  There is stable mild opacity at the right base extending over the costophrenic angle.  IMPRESSION: Right PICC line has been removed.  Stable mild right lung base opacity.   Original Report Authenticated By: Esperanza Heir, M.D.     1. GI bleed   2. Anemia   3. Alcoholic cirrhosis/remote TIPS      CRITICAL CARE Performed by: Timothy Chan. Total critical care time: 30 Critical care time was exclusive of separately billable procedures and treating other patients. Critical care was necessary to treat or prevent imminent or life-threatening deterioration. Critical care was time spent personally by me on the following activities: development of treatment plan with patient and/or surrogate as well as nursing, discussions with consultants, evaluation of patient's response to treatment, examination of patient, obtaining history from patient or surrogate, ordering and performing treatments and interventions, ordering and review of laboratory studies, ordering and review of radiographic studies, pulse oximetry and re-evaluation of patient's condition.   MDM  Patient with a history of cirrhosis his had trouble with upper GI bleed the beginning of the month was admitted on August 6 had upper endoscopy with ablation of some bleeding sites he says in the first part of the stomach patient has continued to black tarry stools. Patient's hemoglobin has dropped down below 8. Patient's had fatigue is tachycardic blood pressure is normal not hypotensive. Patient will require admission monitored bed GI consultation. Blood transfusion ordered one unit to be given and started down here in the emergency department. Patient also has a history of dialysis has not been completely dialyzed since last  week a partial dialysis on Monday despite that the potassium is fine chest x-rays negative for any pulmonary edema or pleural effusions.  I personally performed the services described in this documentation, which was scribed in my presence. The recorded information has been reviewed and is accurate.     Timothy Jakes, MD 02/23/13 1300  Addendum: Will call patient's GI Dr. for consultation and will notify nephrology. Temporary admit orders done for step down unit.  Timothy Jakes, MD 02/23/13 [phone]

## 2013-02-23 NOTE — Progress Notes (Signed)
Talked with patient about why he has not gone to dialysis since discharge.  Patient verbalized he had nobody to take him to dialysis.  Patient noncompliant with medication regimen.  Patient stated he has been bleeding since he was discharged and he told the doctors he did not need to leave.  Patient asking for food and constantly scratching.  Patient says I need something for the itching, my phosphorus is high.  Ask patient how medical staff was supposed to help him if he was not compliant with the treatment plan? Patient says nobody will help me get to my appointments.

## 2013-02-23 NOTE — Progress Notes (Signed)
Patients son to see patient.  Talked to son in private concerning his dad and not getting to his appointments.  He says he deals with this all the time and says he is "attention seeking".  He says he tries to help him all the time but he can not do anything with him.  Will put in a case management and social worker consult.

## 2013-02-23 NOTE — Progress Notes (Signed)
INITIAL NUTRITION ASSESSMENT  DOCUMENTATION CODES Per approved criteria  -Non-severe (moderate) malnutrition in the context of chronic illness; ongoing   INTERVENTION: Follow for diet advancement and nutrition care progression  NUTRITION DIAGNOSIS: Inadequate oral intake related to altered GI function; ongoing  Goal: Pt to meet >/= 90% of their estimated nutrition needs; not met  Monitor:  Diet advancement, Po intake, labs and wt trends  Reason for Assessment: Malnutrition Screen Score = 2  62 y.o. male   Patient Active Problem List   Diagnosis Date Noted  . Malnutrition of moderate degree 02/16/2013  . Upper GI bleeding 02/14/2013  . ESRD on dialysis 02/14/2013  . Anorexia 01/18/2013  . 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 Thrombocytopenia, unspecified 10/31/2012  . SIRS (systemic inflammatory response syndrome) 10/26/2012  . Abdominal pain 10/26/2012  . Peritonitis associated with peritoneal dialysis 10/26/2012  . GI bleed 10/24/2012  . ESRD on peritoneal dialysis 10/20/2012  . Acute blood loss anemia 10/20/2012  . Hyperammonemia 10/20/2012  . Altered mental status 10/20/2012  . Alcoholic cirrhosis/remote TIPS 04/10/2012  . Hepatic encephalopathy 04/10/2012  . Diabetes mellitus type 2, uncontrolled, with complications 04/10/2012  . 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 CKD (chronic kidney disease) stage V requiring chronic dialysis 04/10/2012  . Peripheral neuropathy 04/10/2012    ASSESSMENT: Pt re-admitted c/o black, tarry stools.Currently NPO. GI consult pending. Will follow and evaluate nutrition adequacy as diet is advanced.  Nutr note 02/15/13: Pt has hx of ETOH related liver dz, ESRD with HD and DM. Hx also includes anorexia and current wt reflects significant, unintentional wt loss of 14#, 6% x 30 days. He is receiving and appetite stimulant (started 1 month ago per pt).  He follows Low Sodium diet at home. He shops and prepares his own  food.according to diet hx he drinks diet Mtn Dew and 1 cup of milk daily and eats a sandwich. Does not take nutrition supplements such as Nepro due to cost.    Height: Ht Readings from Last 1 Encounters:  02/14/13 6\' 3"  (1.905 m)    Weight: Wt Readings from Last 1 Encounters:  01/18/13 210 lb (95.255 kg)    Ideal Body Weight: 196# (89 kg)  % Ideal Body Weight: 105%  Wt Readings from Last 10 Encounters:  01/18/13 210 lb (95.255 kg)  01/15/13 220 lb (99.791 kg)  01/05/13 228 lb (103.42 kg)  12/19/12 230 lb (104.327 kg)  11/25/12 226 lb 6.6 oz (102.7 kg)  11/25/12 226 lb 6.6 oz (102.7 kg)  11/02/12 227 lb 8.2 oz (103.2 kg)  11/02/12 227 lb 8.2 oz (103.2 kg)  10/24/12 226 lb (102.513 kg)  10/24/12 226 lb (102.513 kg)    Usual Body Weight: 225-230#  % Usual Body Weight: 92%  BMI:  There is no weight on file to calculate BMI.overweight  Estimated Nutritional Needs: Kcal: 2400-2700 Protein: 110-125 gr Fluid: 1000 ml plus urine output  Skin: diabetic ulcers toe and foot black scabbed area  Diet Order: NPO  EDUCATION NEEDS: -Education needs addressed   Intake/Output Summary (Last 24 hours) at 02/23/13 1616 Last data filed at 02/23/13 1329  Gross per 24 hour  Intake    500 ml  Output      0 ml  Net    500 ml    Last BM: PTA  Labs:   Recent Labs Lab 02/18/13 0459 02/19/13 0452 02/23/13 1015  NA 134* 135 133*  K 3.5 3.5 3.5  CL 99 101 97  CO2  26 23 21   BUN 28* 38* 72*  CREATININE 5.06* 5.98* 7.68*  CALCIUM 7.9* 8.4 8.6  GLUCOSE 273* 202* 128*    CBG (last 3)  No results found for this basename: GLUCAP,  in the last 72 hours  Scheduled Meds: . ciprofloxacin  500 mg Oral Daily  . furosemide  80 mg Oral BID  . hydrocortisone valerate cream   Topical Q M,W,F  . insulin aspart  0-9 Units Subcutaneous Q4H  . megestrol  40 mg Oral QID  . pantoprazole (PROTONIX) IV  80 mg Intravenous Once  . [START ON 02/27/2013] pantoprazole (PROTONIX) IV  40 mg  Intravenous Q12H  . sodium chloride  3 mL Intravenous Q12H  . sucralfate  1 g Oral QID    Continuous Infusions: . sodium chloride 50 mL/hr at 02/23/13 1606  . pantoprozole (PROTONIX) infusion      Past Medical History  Diagnosis Date  . Rectal bleeding   . Dialysis care   . GI bleed   . Hypertension   . Esophageal varices   . Alcoholic liver disease   . Hepatic encephalopathy   . Ascites   . Diabetes mellitus   . Detached retina   . Renal disorder   . Pneumonia     Past Surgical History  Procedure Laterality Date  . Esophagogastroduodenoscopy  12/31/2010    EGD [facility] THERAPY  . Esophagogastroduodenoscopy  05/31/2012E    GD [facility] ABLATION  . Small bowel givens  12/01/2010  . Colonoscopy  09/07/2010  . Esophagogastroduodenoscopy  09/05/2010    OUTLAW  . Lung surgery  6/11    [place]  . Esophagogastroduodenoscopy  03/26/2011    Procedure: ESOPHAGOGASTRODUODENOSCOPY (EGD);  Surgeon: Malissa Hippo, MD;  [facility];  Service: Endoscopy;  Laterality: N/A;  8:30 am  . Hot hemostasis  03/26/2011    Procedure: HOT HEMOSTASIS (ARGON PLASMA COAGULATION/BICAP);  Surgeon: Malissa Hippo, MD;  [facility];  Service: Endoscopy;  Laterality: N/A;  . Stent in liver    . Esophagogastroduodenoscopy N/A 10/24/2012    Procedure: ESOPHAGOGASTRODUODENOSCOPY (EGD);  Surgeon: Malissa Hippo, MD;  [facility];  Service: Endoscopy;  Laterality: N/A;  . Esophagogastroduodenoscopy N/A 11/22/2012    Procedure: ESOPHAGOGASTRODUODENOSCOPY (EGD);  Surgeon: Charna Elizabeth, MD;  Location: [facility] ENDOSCOPY;  Service: Endoscopy;  Laterality: N/A;  . Paracentesis    . Esophagogastroduodenoscopy N/A 12/21/2012    Procedure: ESOPHAGOGASTRODUODENOSCOPY (EGD);  Surgeon: Malissa Hippo, MD;  [facility];  Service: Endoscopy;  Laterality: N/A;  250-moved to 155 Ann to notify pt  . Hot hemostasis N/A 12/21/2012    Procedure: HOT HEMOSTASIS (ARGON PLASMA  COAGULATION/BICAP);  Surgeon: Malissa Hippo, MD;  [facility];  Service: Endoscopy;  Laterality: N/A;  . Esophagogastroduodenoscopy N/A 01/25/2013    Procedure: ESOPHAGOGASTRODUODENOSCOPY (EGD);  Surgeon: Malissa Hippo, MD;  [facility];  Service: Endoscopy;  Laterality: N/A;  100  . Hot hemostasis N/A 01/25/2013    Procedure: HOT HEMOSTASIS (ARGON PLASMA COAGULATION/BICAP);  Surgeon: Malissa Hippo, MD;  [facility];  Service: Endoscopy;  Laterality: N/A;  . Esophagogastroduodenoscopy N/A 02/16/2013    Procedure: ESOPHAGOGASTRODUODENOSCOPY (EGD);  Surgeon: Malissa Hippo, MD;  [facility];  Service: Endoscopy;  Laterality: N/A;    Royann Shivers MS,RD,LDN,CSG Office: [phone] Pager: [phone]

## 2013-02-23 NOTE — Evaluation (Addendum)
Physical Therapy Evaluation Patient Details Name: Timothy Chan MRN: [id number] DOB: 11/14/1950 Today's Date: 02/23/2013 Time: 2841-3244 PT Time Calculation (min): 23 min  PT Assessment / Plan / Recommendation History of Present Illness  is a 62 y.o. male with history of ESRD on MWF hemodialysis, alcoholic liver disease, esophageal varices, hypertension, ascites, type II DM, was recently discharged from the hospital on 02/19/2013 from an episode of upper GI bleed. At that time he was admitted with hematemesis and melena  Clinical Impression  Pt is a 62 year old male referred to PT for general mobility.  At this time he is receiving blood transfusion and therefore did not perform mobility and performed a MMT and ROM testing which shows decreased strength to Lt side and reports he has had that for many years and is unsure why.  PLOF was using a W/C and over the past few weeks he has been falling during SPT from W/C to chair due to increased lethargy and weakness.  Recommend SNF secondary to decreased caregiver support.  Pt reports that his wife lives in [place] and does not have family or friends around to help.     PT Assessment  Patient needs continued PT services    Follow Up Recommendations  SNF    Does the patient have the potential to tolerate intense rehabilitation      Barriers to Discharge Decreased caregiver support      Equipment Recommendations  None recommended by PT    Recommendations for Other Services     Frequency Min 3X/week    Precautions / Restrictions   Fall  Pertinent Vitals/Pain Reports fatigue, denies pain      Mobility       Exercises     PT Diagnosis: Difficulty walking;Generalized weakness  PT Problem List: Decreased strength;Decreased activity tolerance PT Treatment Interventions: Therapeutic exercise;Therapeutic activities     PT Goals(Current goals can be found in the care plan section) Acute Rehab PT Goals PT Goal Formulation: Patient unable  to participate in goal setting Potential to Achieve Goals: Fair  Visit Information  Last PT Received On: 02/23/13 Reason Eval/Treat Not Completed: Medical issues which prohibited therapy History of Present Illness: is a 62 y.o. male with history of ESRD on MWF hemodialysis, alcoholic liver disease, esophageal varices, hypertension, ascites, type II DM, was recently discharged from the hospital on 02/19/2013 from an episode of upper GI bleed. At that time he was admitted with hematemesis and melena       Prior Functioning  Home Living Family/patient expects to be discharged to:: Private residence Living Arrangements: Alone Type of Home: House Home Access: Ramped entrance Entrance Stairs-Rails: Right Home Layout: One level Home Equipment: Wheelchair - [employer] - 2 wheels;Shower seat;Grab bars - tub/shower;Cane - single point Prior Function Level of Independence: Independent with assistive device(s) Comments: use a W/C for most mobility  Communication Communication: No difficulties Dominant Hand: Right    Cognition       Extremity/Trunk Assessment Lower Extremity Assessment Lower Extremity Assessment: LLE deficits/detail LLE Deficits / Details: hip flexion 3+/5, hip extension: 3/5, hip abduction 3/5, hip adduction 3/5, knee flexion and extension 4/5   Balance    End of Session PT - End of Session Activity Tolerance: Patient limited by fatigue  GP     Arlyne Brandes 02/23/2013, 4:31 PM

## 2013-02-23 NOTE — Consult Note (Signed)
Reason for Consult: End-stage renal disease Referring Physician: Dr.Hongaigi  Timothy Chan is an 62 y.o. male.  HPI: Is a patient who has history of diabetes, a chronic liver cirrhosis and end-stage Renal disease is presently came with complaints of weakness and recurrent dark stool. Patient was here last week and discharged after he had workup for GI bleeding. During that time patient received blood transfusion . He has upper endoscopy. Presently his hemoglobin has declined hence admitted for possible GI bleeding. Patient noncompliant with his dialysis and getting him to the unit after he is discharged from the hospital. Presently denies any difficulty breathing. He denies also any nausea or vomiting.  Past Medical History  Diagnosis Date  . Rectal bleeding   . Dialysis care   . GI bleed   . Hypertension   . Esophageal varices   . Alcoholic liver disease   . Hepatic encephalopathy   . Ascites   . Diabetes mellitus   . Detached retina   . Renal disorder   . Pneumonia     Past Surgical History  Procedure Laterality Date  . Esophagogastroduodenoscopy  12/31/2010    EGD [facility] THERAPY  . Esophagogastroduodenoscopy  05/31/2012E    GD [facility] ABLATION  . Small bowel givens  12/01/2010  . Colonoscopy  09/07/2010  . Esophagogastroduodenoscopy  09/05/2010    OUTLAW  . Lung surgery  6/11    [place]  . Esophagogastroduodenoscopy  03/26/2011    Procedure: ESOPHAGOGASTRODUODENOSCOPY (EGD);  Surgeon: Malissa Hippo, MD;  [facility];  Service: Endoscopy;  Laterality: N/A;  8:30 am  . Hot hemostasis  03/26/2011    Procedure: HOT HEMOSTASIS (ARGON PLASMA COAGULATION/BICAP);  Surgeon: Malissa Hippo, MD;  [facility];  Service: Endoscopy;  Laterality: N/A;  . Stent in liver    . Esophagogastroduodenoscopy N/A 10/24/2012    Procedure: ESOPHAGOGASTRODUODENOSCOPY (EGD);  Surgeon: Malissa Hippo, MD;  [facility];  Service: Endoscopy;  Laterality: N/A;  .  Esophagogastroduodenoscopy N/A 11/22/2012    Procedure: ESOPHAGOGASTRODUODENOSCOPY (EGD);  Surgeon: Charna Elizabeth, MD;  Location: [facility] END[facility]COPY;  Service: Endoscopy;  Laterality: N/A;  . Paracentesis    . Esophagogastroduodenoscopy N/A 12/21/2012    Procedure: ESOPHAGOGASTRODUODENOSCOPY (EGD);  Surgeon: Malissa Hippo, MD;  [facility];  Service: Endoscopy;  Laterality: N/A;  250-moved to 155 Ann to notify pt  . Hot hemostasis N/A 12/21/2012    Procedure: HOT HEMOSTASIS (ARGON PLASMA COAGULATION/BICAP);  Surgeon: Malissa Hippo, MD;  [facility];  Service: Endoscopy;  Laterality: N/A;  . Esophagogastroduodenoscopy N/A 01/25/2013    Procedure: ESOPHAGOGASTRODUODENOSCOPY (EGD);  Surgeon: Malissa Hippo, MD;  [facility];  Service: Endoscopy;  Laterality: N/A;  100  . Hot hemostasis N/A 01/25/2013    Procedure: HOT HEMOSTASIS (ARGON PLASMA COAGULATION/BICAP);  Surgeon: Malissa Hippo, MD;  [facility];  Service: Endoscopy;  Laterality: N/A;  . Esophagogastroduodenoscopy N/A 02/16/2013    Procedure: ESOPHAGOGASTRODUODENOSCOPY (EGD);  Surgeon: Malissa Hippo, MD;  [facility];  Service: Endoscopy;  Laterality: N/A;    History reviewed. No pertinent family history.  Social History:  reports that he quit smoking about 18 months ago. His smoking use included Cigarettes. He has a 30 pack-year smoking history. He quit smokeless tobacco use about 9 months ago. He reports that he does not drink alcohol or use illicit drugs.  Allergies:  Allergies  Allergen Reactions  . Tylenol [Acetaminophen] Other (See Comments)  cirrhosis  . Morphine And Related Itching    Medications: I have reviewed the patient's current medications.  Results for orders placed during the hospital encounter of 02/23/13 (from the past 48 hour(s))  CBC WITH DIFFERENTIAL     Status: Abnormal   Collection Time    02/23/13 10:15 AM      Result Value Range   WBC 10.9 (*) 4.0  - 10.5 K/uL   RBC 2.46 (*) 4.22 - 5.81 MIL/uL   Hemoglobin 7.5 (*) 13.0 - 17.0 g/dL   HCT 47.8 (*) 29.5 - 62.1 %   MCV 92.7  78.0 - 100.0 fL   MCH 30.5  26.0 - 34.0 pg   MCHC 32.9  30.0 - 36.0 g/dL   RDW 30.8 (*) 65.7 - 84.6 %   Platelets 131 (*) 150 - 400 K/uL   Neutrophils Relative % 55  43 - 77 %   Neutro Abs 6.0  1.7 - 7.7 K/uL   Lymphocytes Relative 25  12 - 46 %   Lymphs Abs 2.7  0.7 - 4.0 K/uL   Monocytes Relative 12  3 - 12 %   Monocytes Absolute 1.3 (*) 0.1 - 1.0 K/uL   Eosinophils Relative 6 (*) 0 - 5 %   Eosinophils Absolute 0.7  0.0 - 0.7 K/uL   Basophils Relative 1  0 - 1 %   Basophils Absolute 0.1  0.0 - 0.1 K/uL  BASIC METABOLIC PANEL     Status: Abnormal   Collection Time    02/23/13 10:15 AM      Result Value Range   Sodium 133 (*) 135 - 145 mEq/L   Potassium 3.5  3.5 - 5.1 mEq/L   Chloride 97  96 - 112 mEq/L   CO2 21  19 - 32 mEq/L   Glucose, Bld 128 (*) 70 - 99 mg/dL   BUN 72 (*) 6 - 23 mg/dL   Creatinine, Ser 9.62 (*) 0.50 - 1.35 mg/dL   Calcium 8.6  8.4 - 95.2 mg/dL   GFR calc non Af Amer 7 (*) >90 mL/min   GFR calc Af Amer 8 (*) >90 mL/min   Comment: (NOTE)     The eGFR has been calculated using the CKD EPI equation.     This calculation has not been validated in all clinical situations.     eGFR's persistently <90 mL/min signify possible Chronic Kidney     Disease.  TYPE AND SCREEN     Status: None   Collection Time    02/23/13 10:15 AM      Result Value Range   ABO/RH(D) B NEG     Antibody Screen NEG     Sample Expiration 02/26/2013     Unit Number [id number]     Blood Component Type RBC LR PHER1     Unit division 00     Status of Unit ISSUED     Transfusion Status OK TO TRANSFUSE     Crossmatch Result Compatible    AMMONIA     Status: None   Collection Time    02/23/13 10:15 AM      Result Value Range   Ammonia 37  11 - 60 umol/L  LIPASE, BLOOD     Status: Abnormal   Collection Time    02/23/13 10:15 AM      Result Value Range    Lipase 94 (*) 11 - 59 U/L  HEPATIC FUNCTION PANEL     Status: Abnormal   Collection Time  02/23/13 10:15 AM      Result Value Range   Total Protein 5.7 (*) 6.0 - 8.3 g/dL   Albumin 1.6 (*) 3.5 - 5.2 g/dL   AST 62 (*) 0 - 37 U/L   ALT 23  0 - 53 U/L   Alkaline Phosphatase 113  39 - 117 U/L   Total Bilirubin 0.7  0.3 - 1.2 mg/dL   Bilirubin, Direct 0.3  0.0 - 0.3 mg/dL   Indirect Bilirubin 0.4  0.3 - 0.9 mg/dL  PROTIME-INR     Status: None   Collection Time    02/23/13 10:15 AM      Result Value Range   Prothrombin Time 14.6  11.6 - 15.2 seconds   INR 1.16  0.00 - 1.49  APTT     Status: None   Collection Time    02/23/13 10:15 AM      Result Value Range   aPTT 26  24 - 37 seconds  PREPARE RBC (CROSSMATCH)     Status: None   Collection Time    02/23/13 10:15 AM      Result Value Range   Order Confirmation ORDER PROCESSED BY BLOOD BANK    OCCULT BLOOD, POC DEVICE     Status: Abnormal   Collection Time    02/23/13 11:40 AM      Result Value Range   Fecal Occult Bld POSITIVE (*) NEGATIVE    Dg Chest Portable 1 View  02/23/2013   *RADIOLOGY REPORT*  Clinical Data: Gastrointestinal bleeding  PORTABLE CHEST - 1 VIEW  Comparison: 02/16/2013  Findings: Cardiac silhouette upper normal in size.  Vascular pattern within normal limits.  Left lung is clear.  There is stable mild opacity at the right base extending over the costophrenic angle.  IMPRESSION: Right PICC line has been removed.  Stable mild right lung base opacity.   Original Report Authenticated By: Esperanza Heir, M.D.    Review of Systems  Gastrointestinal: Positive for abdominal pain and blood in stool.  Neurological: Positive for weakness.   Blood pressure 156/67, pulse 118, temperature 97.7 F (36.5 C), temperature source Oral, resp. rate 17, SpO2 99.00%. Physical Exam  Assessment/Plan: Problem #1 end-stage renal disease presently his BUN and creatinine is with in acceptable range. Patient denies any nausea or  vomiting and potassium is normal Problem #2 anemia possibly combination of GI bleeding and also lack of Epogen Problem #3 diabetes Problem #4 for chronic liver cirrhosis Problem #5 hepatic encephalopathy recently has been controlled and his ammonia level is was in acceptable range. Patient presently alert and oriented. Problem #6 metabolic bone disease Problem #7 non-compliance with his dialysis and treatment. Plan: At the with blood transfusion We'll make arrangements for patient to get dialysis tomorrow. We'll check his CBC, basic metabolic panel and phosphorus in the morning. We'll continue to encourage him to be compliant with his dialysis and treatment.  Kielee Care S 02/23/2013, 4:18 PM

## 2013-02-23 NOTE — H&P (Signed)
[facility] History and Physical  THOMES BURAK WGN:[id number] DOB: 1951-01-18 DOA: 02/23/2013  Referring physician: EDP PCP: Ignatius Specking., MD  OP Specialists:  1. GI: Dr. Lionel December 2. Nephrology: Dr. Salomon Mast.  Chief Complaint: Black tarry stools & generalized.   HPI: Timothy Chan is a 62 y.o. male with history of ESRD on MWF hemodialysis, alcoholic liver disease, esophageal varices, hypertension, ascites, type II DM, was recently discharged from the hospital on 02/19/2013 from an episode of upper GI bleed. At that time he was admitted with hematemesis and melena. GI consulted and performed EGD which showed no evidence of gastric paresis. It did show portal gastropathy and multiple gastric antral vascular ectasia which were treated. This was one of several EGDs that he's had. Patient states that since discharge, he has continued to have 3-5 black tarry BMs daily with associated generalized weakness with he is unable to get off the bed or couch to his wheelchair and perform any home activities. He even missed his dialysis on Wednesday because there was nobody to help him. He complains of intermittent right mid quadrant abdominal pain which is vague and mild. He denies nausea or vomiting. He denies bright or dark blood in stools. No NSAID use. He states that he sustained about 5-6 falls at home without injuries or loss of consciousness, secondary to weakness. In the ED, hemoglobin has dropped from 8.5 on 8/11 to 7.5 today. Hospitalist admission requested.   Review of Systems: All systems reviewed and apart from history of presenting illness, negative    Past Medical History  Diagnosis Date  . Rectal bleeding   . Dialysis care   . GI bleed   . Hypertension   . Esophageal varices   . Alcoholic liver disease   . Hepatic encephalopathy   . Ascites   . Diabetes mellitus   . Detached retina   . Renal disorder   . Pneumonia    Past Surgical History  Procedure Laterality  Date  . Esophagogastroduodenoscopy  12/31/2010    EGD [facility] THERAPY  . Esophagogastroduodenoscopy  05/31/2012E    GD [facility] ABLATION  . Small bowel givens  12/01/2010  . Colonoscopy  09/07/2010  . Esophagogastroduodenoscopy  09/05/2010    OUTLAW  . Lung surgery  6/11    [place]  . Esophagogastroduodenoscopy  03/26/2011    Procedure: ESOPHAGOGASTRODUODENOSCOPY (EGD);  Surgeon: Malissa Hippo, MD;  [facility];  Service: Endoscopy;  Laterality: N/A;  8:30 am  . Hot hemostasis  03/26/2011    Procedure: HOT HEMOSTASIS (ARGON PLASMA COAGULATION/BICAP);  Surgeon: Malissa Hippo, MD;  [facility];  Service: Endoscopy;  Laterality: N/A;  . Stent in liver    . Esophagogastroduodenoscopy N/A 10/24/2012    Procedure: ESOPHAGOGASTRODUODENOSCOPY (EGD);  Surgeon: Malissa Hippo, MD;  [facility];  Service: Endoscopy;  Laterality: N/A;  . Esophagogastroduodenoscopy N/A 11/22/2012    Procedure: ESOPHAGOGASTRODUODENOSCOPY (EGD);  Surgeon: Charna Elizabeth, MD;  Location: [facility] END[facility]COPY;  Service: Endoscopy;  Laterality: N/A;  . Paracentesis    . Esophagogastroduodenoscopy N/A 12/21/2012    Procedure: ESOPHAGOGASTRODUODENOSCOPY (EGD);  Surgeon: Malissa Hippo, MD;  [facility];  Service: Endoscopy;  Laterality: N/A;  250-moved to 155 Ann to notify pt  . Hot hemostasis N/A 12/21/2012    Procedure: HOT HEMOSTASIS (ARGON PLASMA COAGULATION/BICAP);  Surgeon: Malissa Hippo, MD;  [facility];  Service: Endoscopy;  Laterality: N/A;  . Esophagogastroduodenoscopy N/A 01/25/2013  Procedure: ESOPHAGOGASTRODUODENOSCOPY (EGD);  Surgeon: Malissa Hippo, MD;  [facility];  Service: Endoscopy;  Laterality: N/A;  100  . Hot hemostasis N/A 01/25/2013    Procedure: HOT HEMOSTASIS (ARGON PLASMA COAGULATION/BICAP);  Surgeon: Malissa Hippo, MD;  [facility];  Service: Endoscopy;  Laterality: N/A;  . Esophagogastroduodenoscopy N/A 02/16/2013     Procedure: ESOPHAGOGASTRODUODENOSCOPY (EGD);  Surgeon: Malissa Hippo, MD;  [facility];  Service: Endoscopy;  Laterality: N/A;   Social History:  reports that he quit smoking about 18 months ago. His smoking use included Cigarettes. He has a 30 pack-year smoking history. He quit smokeless tobacco use about 9 months ago. He reports that he does not drink alcohol or use illicit drugs. Married. Spouse apparently in [place] due to immigration issues. Has quit smoking and alcohol consumption.  Allergies  Allergen Reactions  . Tylenol [Acetaminophen] Other (See Comments)    cirrhosis  . Morphine And Related Itching    History reviewed. No pertinent family history. negative family history  Prior to Admission medications   Medication Sig Start Date End Date Taking? Authorizing Provider  b complex-vitamin c-folic acid (NEPHRO-VITE) 0.8 MG TABS Take 1 tablet by mouth at bedtime. 11/25/12  Yes Christiane Ha, MD  ciprofloxacin (CIPRO) 500 MG tablet Take 1 tablet (500 mg total) by mouth daily. 01/18/13  Yes Len Blalock, NP  esomeprazole (NEXIUM) 40 MG capsule Take 1 capsule (40 mg total) by mouth daily before breakfast. 01/25/13  Yes Malissa Hippo, MD  furosemide (LASIX) 80 MG tablet Take 80 mg by mouth 2 (two) times daily. 02/19/13  Yes Wilson Singer, MD  hydrocortisone valerate ointment (WESTCORT) 0.2 % Apply 1 application topically every Monday, Wednesday, and Friday. For numbing prior to dialysis   Yes Historical Provider, MD  lactulose (CHRONULAC) 10 GM/15ML solution Take 20 g by mouth daily as needed (for constipation). 11/25/12  Yes Christiane Ha, MD  megestrol (MEGACE) 40 MG tablet Take 1 tablet (40 mg total) by mouth 4 (four) times daily. 01/18/13  Yes Len Blalock, NP  ondansetron (ZOFRAN) 4 MG tablet Take 4 mg by mouth daily as needed for nausea.    Yes Historical Provider, MD  sucralfate (CARAFATE) 1 GM/10ML suspension Take 10 mLs (1 g total) by mouth 4 (four) times  daily. 01/15/13  Yes Flint Melter, MD  traMADol (ULTRAM) 50 MG tablet Take 50 mg by mouth 3 (three) times daily as needed for pain.    Yes Historical Provider, MD   Physical Exam: Filed Vitals:   02/23/13 1200 02/23/13 1230 02/23/13 1300 02/23/13 1329  BP: 152/68 135/88 131/90 134/62  Pulse: 119 118  118  Temp:    97.7 F (36.5 C)  TempSrc:      Resp: 20 15 19 14   SpO2: 98% 98%       General exam: Moderately built and poorly nourished chronically ill-looking male patient, lying comfortably supine on the gurney in no obvious distress.  Head, eyes and ENT: Nontraumatic and normocephalic. Pupils equally reacting to light and accommodation. Oral mucosa slightly dry. No blood or coffee-ground.  Neck: Supple. No JVD, carotid bruit or thyromegaly.  Lymphatics: No lymphadenopathy.  Respiratory system: Slightly reduced breath sounds in the bases with bibasal occational crackles. Rest of lung fields are clear to auscultation. No increased work of breathing.  Cardiovascular system: S1 and S2 heard, RRR. No JVD, murmurs, gallops, clicks. 1+ bilateral leg edema.  Gastrointestinal system: Abdomen  is moderately distended, not tense, mild right mid quadrant tenderness but without rigidity, guarding or rebound. Normal bowel sounds heard. No organomegaly or masses appreciated.  Central nervous system: Alert and oriented. No focal neurological deficits.  Extremities: Symmetric 5 x 5 power. Peripheral pulses symmetrically felt.  Skin: No rashes or acute findings.  Musculoskeletal system: Negative exam.  Psychiatry: Pleasant and cooperative.   Labs on Admission:  Basic Metabolic Panel:  Recent Labs Lab 02/17/13 0506 02/18/13 0459 02/19/13 0452 02/23/13 1015  NA 136 134* 135 133*  K 3.6 3.5 3.5 3.5  CL 102 99 101 97  CO2 23 26 23 21   GLUCOSE 197* 273* 202* 128*  BUN 48* 28* 38* 72*  CREATININE 7.73* 5.06* 5.98* 7.68*  CALCIUM 7.9* 7.9* 8.4 8.6   Liver Function Tests:  Recent  Labs Lab 02/23/13 1015  AST 62*  ALT 23  ALKPHOS 113  BILITOT 0.7  PROT 5.7*  ALBUMIN 1.6*    Recent Labs Lab 02/23/13 1015  LIPASE 94*    Recent Labs Lab 02/23/13 1015  AMMONIA 37   CBC:  Recent Labs Lab 02/17/13 0506 02/18/13 0459 02/19/13 0452 02/23/13 1015  WBC 5.8 7.6 8.8 10.9*  NEUTROABS  --   --   --  6.0  HGB 9.1* 9.0* 8.5* 7.5*  HCT 26.9* 26.5* 25.3* 22.8*  MCV 90.3 91.4 90.7 92.7  PLT 98* 74* 85* 131*   Cardiac Enzymes: No results found for this basename: CKTOTAL, CKMB, CKMBINDEX, TROPONINI,  in the last 168 hours  BNP (last 3 results)  Recent Labs  11/21/12 1145  PROBNP 1167.0*   CBG:  Recent Labs Lab 02/18/13 1115 02/18/13 1614 02/18/13 2131 02/19/13 0733 02/19/13 1120  GLUCAP 209* 283* 165* 187* 146*    Radiological Exams on Admission: Dg Chest Portable 1 View  02/23/2013   *RADIOLOGY REPORT*  Clinical Data: Gastrointestinal bleeding  PORTABLE CHEST - 1 VIEW  Comparison: 02/16/2013  Findings: Cardiac silhouette upper normal in size.  Vascular pattern within normal limits.  Left lung is clear.  There is stable mild opacity at the right base extending over the costophrenic angle.  IMPRESSION: Right PICC line has been removed.  Stable mild right lung base opacity.   Original Report Authenticated By: Esperanza Heir, M.D.    Assessment/Plan Active Problems:   Alcoholic cirrhosis/remote TIPS   Diabetes mellitus type 2, uncontrolled, with complications   Acute blood loss anemia   Thrombocytopenia, unspecified   GAVE (gastric antral vascular ectasia)   Upper GI bleeding   ESRD on dialysis   1. Upper GI bleeding: Not sure if patient has a slow oozing upper GI bleed versus all his melena is from prior retained GI bleed. Only 1 g hemoglobin drop over the last 5 days despite historical 3-5 melanotic stools daily. Admitted to step down unit for close monitoring. N.p.o. except meds and ice chips. IV Protonix infusion. GI consulted by  EDP. 2. Acute blood loss anemia: Patient has chronic anemia from ESRD. Hemoglobin dropped 1 g from recent discharge. He is symptomatic with associated generalized weakness. Agree with EDP transfusing 1 unit PRBCs. Follow H&H closely and transfuse if hemoglobin is less than 8 g per DL. Mild sinus tachycardia likely secondary to this. Monitor on telemetry. 3. ESRD on MWF hemodialysis: Patient had shortened cycle of dialysis on Monday and missed whendialysis completely due to weakness and lack of assistance to bring him to dialysis. Nephrology consulted. No acute indications for dialysis such as hyperkalemia, acidosis, significant volume  overload. 4. Type II DM: SSI. 5. Frequent falls: Secondary to multiple medical problems and associated deconditioning and weakness. Patient apparently lives alone and states that he does not have enough help to assist with his home activities or bringing him to hemodialysis. PT OT consultation and may need social worker input. 6. Cirrhosis/thrombocytopenia/ascites: Follow daily CBCs. No indications for therapeutic tap at this time. 7. Hypertension: Controlled.     Code Status: Full  Family Communication: None  Disposition Plan: To be determined   Time spent: 60 minutes.  [facility] Pager [phone]  If 7PM-7AM, please contact night-coverage [email or web address] Password [facility] 02/23/2013, 1:47 PM

## 2013-02-24 DIAGNOSIS — D649 Anemia, unspecified: Secondary | ICD-10-CM

## 2013-02-24 DIAGNOSIS — K7460 Unspecified cirrhosis of liver: Secondary | ICD-10-CM

## 2013-02-24 DIAGNOSIS — K921 Melena: Secondary | ICD-10-CM

## 2013-02-24 LAB — BASIC METABOLIC PANEL
CO2: 22 mEq/L (ref 19–32)
Calcium: 8.3 mg/dL — ABNORMAL LOW (ref 8.4–10.5)
GFR calc non Af Amer: 6 mL/min — ABNORMAL LOW (ref >90)
Potassium: 3.3 mEq/L — ABNORMAL LOW (ref 3.5–5.1)
Sodium: 136 mEq/L (ref 135–145)

## 2013-02-24 LAB — CBC
HCT: 23.4 % — ABNORMAL LOW (ref 39.0–52.0)
Hemoglobin: 8.1 g/dL — ABNORMAL LOW (ref 13.0–17.0)
Hemoglobin: 8.1 g/dL — ABNORMAL LOW (ref 13.0–17.0)
MCHC: 34.9 g/dL (ref 30.0–36.0)
MCV: 89.7 fL (ref 78.0–100.0)
RBC: 2.61 MIL/uL — ABNORMAL LOW (ref 4.22–5.81)
WBC: 8.9 10*3/uL (ref 4.0–10.5)
WBC: 9.1 10*3/uL (ref 4.0–10.5)

## 2013-02-24 LAB — GLUCOSE, CAPILLARY
Glucose-Capillary: 92 mg/dL (ref 70–99)
Glucose-Capillary: 95 mg/dL (ref 70–99)
Glucose-Capillary: 90 mg/dL (ref 70–99)
Glucose-Capillary: 75 mg/dL (ref 70–99)
Glucose-Capillary: 161 mg/dL — ABNORMAL HIGH (ref 70–99)
Glucose-Capillary: 153 mg/dL — ABNORMAL HIGH (ref 70–99)

## 2013-02-24 MED ORDER — EPOETIN ALFA 10000 UNIT/ML IJ SOLN
10000.0000 [IU] | INTRAMUSCULAR | Status: DC
Start: 2013-02-24 — End: 2013-02-27
  Administered 2013-02-24 – 2013-02-26 (×2): 10000 [IU] via INTRAVENOUS
  Filled 2013-02-24: qty 1

## 2013-02-24 MED ORDER — PENTAFLUOROPROP-TETRAFLUOROETH EX AERO
1.0000 "application " | INHALATION_SPRAY | CUTANEOUS | Status: DC | PRN
  Filled 2013-02-24: qty 103.5

## 2013-02-24 MED ORDER — LIDOCAINE-PRILOCAINE 2.5-2.5 % EX CREA
1.0000 "application " | TOPICAL_CREAM | CUTANEOUS | Status: DC | PRN
  Administered 2013-02-26: 1 via TOPICAL
  Filled 2013-02-24 (×2): qty 5

## 2013-02-24 MED ORDER — NEPRO/CARBSTEADY PO LIQD
237.0000 mL | ORAL | Status: DC | PRN
  Administered 2013-02-24: 237 mL via ORAL

## 2013-02-24 MED ORDER — SODIUM CHLORIDE 0.9 % IV SOLN: 100.0000 mL | INTRAVENOUS | Status: DC | PRN

## 2013-02-24 MED ORDER — LIDOCAINE HCL (PF) 1 % IJ SOLN: 5.0000 mL | INTRAMUSCULAR | Status: DC | PRN

## 2013-02-24 MED ORDER — BOOST / RESOURCE BREEZE PO LIQD
1.0000 | Freq: Three times a day (TID) | ORAL | Status: DC
  Administered 2013-02-24 – 2013-02-27 (×5): 1 via ORAL

## 2013-02-24 MED ORDER — ALTEPLASE 2 MG IJ SOLR: 2.0000 mg | Freq: Once | INTRAMUSCULAR | Status: AC | PRN

## 2013-02-24 MED ORDER — SEVELAMER CARBONATE 800 MG PO TABS
1600.0000 mg | ORAL_TABLET | Freq: Three times a day (TID) | ORAL | Status: DC
  Administered 2013-02-24 – 2013-02-27 (×7): 1600 mg via ORAL
  Filled 2013-02-24 (×8): qty 2

## 2013-02-24 MED ORDER — HEPARIN SODIUM (PORCINE) 1000 UNIT/ML DIALYSIS
1000.0000 [IU] | INTRAMUSCULAR | Status: DC | PRN
  Filled 2013-02-24: qty 1

## 2013-02-24 NOTE — Progress Notes (Signed)
1730 Text paged Dr. Susie Cassette about the patients tachycardia.  His HR has mainly been 113-119.  He has not c/o chest pain.

## 2013-02-24 NOTE — Progress Notes (Signed)
[facility] PROGRESS NOTE  [name] WJX:[id number] DOB: 01/31/1951 DOA: 02/23/2013 PCP: Ignatius Specking., MD  Assessment/Plan: Active Problems:   Alcoholic cirrhosis/remote TIPS   Diabetes mellitus type 2, uncontrolled, with complications   Acute blood loss anemia   Thrombocytopenia, unspecified   GAVE (gastric antral vascular ectasia)   Upper GI bleeding   ESRD on dialysis     1. Upper GI bleeding: Not sure if patient has a slow oozing upper GI bleed versus all his melena is from prior retained GI bleed.hx of GAVE syndrome ,. Wants to see Dr Karilyn Cota ,will notify him. Hg stable after one unit . Admitted to step down unit for close monitoring. N.p.o. except meds and ice chips. IV Protonix infusion. GI consulted by EDP. Dr Odis Luster to see him. 2. Acute blood loss anemia: Patient has chronic anemia from ESRD. Hemoglobin dropped 1 g from recent discharge. He is symptomatic with associated generalized weakness.  Follow H&H closely and transfuse if hemoglobin is less than 8 g per DL. Mild sinus tachycardia likely secondary to this. Monitor on telemetry. 3. ESRD on MWF hemodialysis: Patient had shortened cycle of dialysis on Monday and missed whendialysis completely due to weakness and lack of assistance to bring him to dialysis. Nephrology consulted. HD today  4. Type II DM: SSI. 5. Frequent falls: Secondary to multiple medical problems and associated deconditioning and weakness. Patient apparently lives alone and states that he does not have enough help to assist with his home activities or bringing him to hemodialysis. PT OT consultation and may need social worker input. 6. Cirrhosis/thrombocytopenia/ascites: Follow daily CBCs. No indications for therapeutic tap at this time. 7. Hypertension: Controlled. 8.  Code Status: full Family Communication: family updated about patient's clinical progress Disposition Plan:  As above    Brief narrative: HPI: Timothy Chan is a 62 y.o. male with  history of ESRD on MWF hemodialysis, alcoholic liver disease, esophageal varices, hypertension, ascites, type II DM, was recently discharged from the hospital on 02/19/2013 from an episode of upper GI bleed. At that time he was admitted with hematemesis and melena. GI consulted and performed EGD which showed no evidence of gastric paresis. It did show portal gastropathy and multiple gastric antral vascular ectasia which were treated. This was one of several EGDs that he's had. Patient states that since discharge, he has continued to have 3-5 black tarry BMs daily with associated generalized weakness with he is unable to get off the bed or couch to his wheelchair and perform any home activities. He even missed his dialysis on Wednesday because there was nobody to help him. He complains of intermittent right mid quadrant abdominal pain which is vague and mild. He denies nausea or vomiting. He denies bright or dark blood in stools. No NSAID use. He states that he sustained about 5-6 falls at home without injuries or loss of consciousness, secondary to weakness. In the ED, hemoglobin has dropped from 8.5 on 8/11 to 7.5 today. Hospitalist admission requested.   Consultants:  nephrology  Procedures:  HD   Antibiotics:  None   HPI/Subjective: Feels ok, wants to walk again   Objective: Filed Vitals:   02/24/13 0815 02/24/13 0830 02/24/13 0845 02/24/13 0900  BP: 117/66 137/68 144/76 136/66  Pulse: 116 120 123 119  Temp:      TempSrc:      Resp:      Weight:      SpO2:        Intake/Output Summary (Last 24  hours) at 02/24/13 0915 Last data filed at 02/24/13 0400  Gross per 24 hour  Intake 1658.75 ml  Output    325 ml  Net 1333.75 ml    Exam:  General exam: Moderately built and poorly nourished chronically ill-looking male patient, lying comfortably supine on the gurney in no obvious distress.  Head, eyes and ENT: Nontraumatic and normocephalic. Pupils equally reacting to light and  accommodation. Oral mucosa slightly dry. No blood or coffee-ground.  Neck: Supple. No JVD, carotid bruit or thyromegaly.  Lymphatics: No lymphadenopathy.  Respiratory system: Slightly reduced breath sounds in the bases with bibasal occational crackles. Rest of lung fields are clear to auscultation. No increased work of breathing.  Cardiovascular system: S1 and S2 heard, RRR. No JVD, murmurs, gallops, clicks. 1+ bilateral leg edema.  Gastrointestinal system: Abdomen is moderately distended, not tense, mild right mid quadrant tenderness but without rigidity, guarding or rebound. Normal bowel sounds heard. No organomegaly or masses appreciated.  Central nervous system: Alert and oriented. No focal neurological deficits.  Extremities: Symmetric 5 x 5 power. Peripheral pulses symmetrically felt.  Skin: No rashes or acute findings.  Musculoskeletal system: Negative exam.  Psychiatry: Pleasant and cooperative.      Data Reviewed: Basic Metabolic Panel:  Recent Labs Lab 02/18/13 0459 02/19/13 0452 02/23/13 1015 02/24/13 0227  NA 134* 135 133* 136  K 3.5 3.5 3.5 3.3*  CL 99 101 97 101  CO2 26 23 21 22   GLUCOSE 273* 202* 128* 102*  BUN 28* 38* 72* 78*  CREATININE 5.06* 5.98* 7.68* 8.51*  CALCIUM 7.9* 8.4 8.6 8.3*  PHOS  --   --   --  7.1*    Liver Function Tests:  Recent Labs Lab 02/23/13 1015  AST 62*  ALT 23  ALKPHOS 113  BILITOT 0.7  PROT 5.7*  ALBUMIN 1.6*    Recent Labs Lab 02/23/13 1015  LIPASE 94*    Recent Labs Lab 02/23/13 1015  AMMONIA 37    CBC:  Recent Labs Lab 02/19/13 0452 02/23/13 1015 02/23/13 1746 02/24/13 0227 02/24/13 0832  WBC 8.8 10.9* 9.7 8.9 9.1  NEUTROABS  --  6.0  --   --   --   HGB 8.5* 7.5* 7.4* 8.1* 8.1*  HCT 25.3* 22.8* 21.9* 23.4* 23.2*  MCV 90.7 92.7 91.6 89.7 88.5  PLT 85* 131* 127* 138* 135*    Cardiac Enzymes: No results found for this basename: CKTOTAL, CKMB, CKMBINDEX, TROPONINI,  in the last 168 hours BNP (last  3 results)  Recent Labs  11/21/12 1145  PROBNP 1167.0*     CBG:  Recent Labs Lab 02/23/13 1640 02/23/13 2018 02/24/13 0002 02/24/13 0412 02/24/13 0813  GLUCAP 104* 100* 95 92 90    Recent Results (from the past 240 hour(s))  MRSA PCR SCREENING     Status: None   Collection Time    02/14/13  9:41 PM      Result Value Range Status   MRSA by PCR NEGATIVE  NEGATIVE Final   Comment:            The GeneXpert MRSA Assay (FDA     approved for NASAL specimens     only), is one component of a     comprehensive MRSA colonization     surveillance program. It is not     intended to diagnose MRSA     infection nor to guide or     monitor treatment for     MRSA infections.  STOOL CULTURE     Status: None   Collection Time    02/18/13  3:25 AM      Result Value Range Status   Specimen Description STOOL   Final   Special Requests IMMUNE:COMPROMISED   Final   Culture     Final   Value: NO SALMONELLA, SHIGELLA, CAMPYLOBACTER, YERSINIA, OR E.COLI 0157:H7 ISOLATED     Note: REDUCED NORMAL FLORA PRESENT     Performed at [facility]   Report Status 02/22/2013 FINAL   Final     Studies: X-ray Chest Pa Or Ap  02/14/2013   *RADIOLOGY REPORT*  Clinical Data: Weakness  CHEST - 1 VIEW  Comparison: 01/15/2013  Findings: The cardiac shadow is stable.  The lungs are well-aerated bilaterally.  Some chronic changes are seen in the right lung base. The osseous structures are within normal limits.  IMPRESSION: No acute abnormality noted.   Original Report Authenticated By: Alcide Clever, M.D.   Dg Abd 1 View  02/18/2013   *RADIOLOGY REPORT*  Clinical Data: 62 year old male with abdominal pain and diarrhea  ABDOMEN - 1 VIEW  Comparison: 01/05/2013 and prior radiographs dating back to 09/09/2010  Findings: Scattered nondistended gas-filled loops of small bowel and colon noted. There is no evidence of bowel obstruction. A TIPS is again identified. No acute bony abnormalities are identified.   IMPRESSION: Nonspecific nonobstructive bowel gas pattern.   Original Report Authenticated By: Harmon Pier, M.D.   Dg Chest Portable 1 View  02/23/2013   *RADIOLOGY REPORT*  Clinical Data: Gastrointestinal bleeding  PORTABLE CHEST - 1 VIEW  Comparison: 02/16/2013  Findings: Cardiac silhouette upper normal in size.  Vascular pattern within normal limits.  Left lung is clear.  There is stable mild opacity at the right base extending over the costophrenic angle.  IMPRESSION: Right PICC line has been removed.  Stable mild right lung base opacity.   Original Report Authenticated By: Esperanza Heir, M.D.   Dg Chest Port 1 View  02/16/2013   *RADIOLOGY REPORT*  Clinical Data: 62 year old male status post PICC line placement.  PORTABLE CHEST - 1 VIEW  Comparison: 02/14/2013 and earlier.  Findings: Portable AP upright view 1253 hours.  Right upper extremity approach PICC line, tip projects about 2 cm below the carina at the lower SVC level.  Stable lung volumes.  No pneumothorax.  Stable to mildly increased asymmetric reticulonodular opacity most pronounced in the mid and right lower lung.  Stable mild right lung base veiling opacity. Stable cardiac size and mediastinal contours.  Visualized tracheal air column is within normal limits.  IMPRESSION: 1.  Right PICC line catheter, tip at the lower SVC level. 2.  Stable to mildly increased right lung base opacity.   Original Report Authenticated By: Erskine Speed, M.D.    Scheduled Meds: . ciprofloxacin  500 mg Oral Daily  . epoetin alfa  10,000 Units Intravenous 3 times weekly  . furosemide  80 mg Oral BID  . hydrocortisone valerate cream   Topical Q M,W,F  . insulin aspart  0-9 Units Subcutaneous Q4H  . megestrol  40 mg Oral QID  . pantoprazole (PROTONIX) IV  80 mg Intravenous Once  . [START ON 02/27/2013] pantoprazole (PROTONIX) IV  40 mg Intravenous Q12H  . sodium chloride  3 mL Intravenous Q12H  . sucralfate  1 g Oral QID   Continuous Infusions: .  pantoprozole (PROTONIX) infusion 8 mg/hr (02/23/13 1900)    Active Problems:   Alcoholic cirrhosis/remote TIPS  Diabetes mellitus type 2, uncontrolled, with complications   Acute blood loss anemia   Thrombocytopenia, unspecified   GAVE (gastric antral vascular ectasia)   Upper GI bleeding   ESRD on dialysis    Time spent: 40 minutes   [facility]  [facility] Pager [phone]. If 8PM-8AM, please contact night-coverage at [email or web address], password [facility] 02/24/2013, 9:15 AM  LOS: 1 day

## 2013-02-24 NOTE — Progress Notes (Signed)
Subjective: Interval History: has no complaint of nausea but no vomiting. He states that he has some dark stool last night. He denies any difficulty breathing. Presently patient is on dialysis..  Objective: Vital signs in last 24 hours: Temp:  [97.7 F (36.5 C)-98.5 F (36.9 C)] 98.2 F (36.8 C) (08/16 0715) Pulse Rate:  [107-123] 120 (08/16 1030) Resp:  [10-20] 18 (08/16 0715) BP: (117-170)/(56-90) 126/62 mmHg (08/16 1030) SpO2:  [98 %-99 %] 98 % (08/16 0715) Weight:  [102.1 kg (225 lb 1.4 oz)-102.15 kg (225 lb 3.2 oz)] 102.1 kg (225 lb 1.4 oz) (08/16 0715) Weight change:   Intake/Output from previous day: 08/15 0701 - 08/16 0700 In: 1658.8 [P.O.:220; I.V.:1127.1; Blood:311.7] Out: 325 [Urine:325] Intake/Output this shift:    General appearance: alert, cooperative and no distress Resp: clear to auscultation bilaterally Cardio: regular rate and rhythm, S1, S2 normal, no murmur, click, rub or gallop GI: soft, non-tender; bowel sounds normal; no masses,  no organomegaly Extremities: extremities normal, atraumatic, no cyanosis or edema  Lab Results:  Recent Labs  02/24/13 0227 02/24/13 0832  WBC 8.9 9.1  HGB 8.1* 8.1*  HCT 23.4* 23.2*  PLT 138* 135*   BMET:  Recent Labs  02/23/13 1015 02/24/13 0227  NA 133* 136  K 3.5 3.3*  CL 97 101  CO2 21 22  GLUCOSE 128* 102*  BUN 72* 78*  CREATININE 7.68* 8.51*  CALCIUM 8.6 8.3*   No results found for this basename: PTH,  in the last 72 hours Iron Studies: No results found for this basename: IRON, TIBC, TRANSFERRIN, FERRITIN,  in the last 72 hours  Studies/Results: Dg Chest Portable 1 View  02/23/2013   *RADIOLOGY REPORT*  Clinical Data: Gastrointestinal bleeding  PORTABLE CHEST - 1 VIEW  Comparison: 02/16/2013  Findings: Cardiac silhouette upper normal in size.  Vascular pattern within normal limits.  Left lung is clear.  There is stable mild opacity at the right base extending over the costophrenic angle.  IMPRESSION:  Right PICC line has been removed.  Stable mild right lung base opacity.   Original Report Authenticated By: Esperanza Heir, M.D.    I have reviewed the patient's current medications.  Assessment/Plan: Problem #1 end-stage renal disease patient presently on dialysis. 78 and creatinine is 8.51. His potassium is 3.3. Problem #2 anemia his hemoglobin is 8.1 hematocrit is 23.2 better. Patient has received 2 units of blood transfusion. Problem #3 diabetes Problem #4 hypertension his blood pressure seems to be reasonably controlled Problem #5 alcoholic cirrhosis  Problem #6 metabolic bone disease his calcium was in acceptable range but phosphorus is high. Patient is not taking his binders. Problem #7 hepatic encephalopathy patient is alert and oriented. Problem #8 noncompliance with dialysis. Plan: We'll restart him on Renvella 800 mg 2 tablet daily with each meal. We'll change his dialysate to 4k+/2.5 ca+ calcium bath. We'll check his CBC and basic metabolic panel in the morning.   LOS: 1 day   Chazz Philson S 02/24/2013,10:39 AM

## 2013-02-24 NOTE — Procedures (Signed)
   HEMODIALYSIS TREATMENT NOTE:  4 hour heparin-free dialysis treatment completed via left forearm AVF (15g/antegrade).  Goal met:  Tolerated removal of 0.5 liters with no interruption in ultrafiltration.  All blood was reinfused.  Hemostasis was achieved within 10 minutes.  Report given to Zannie Kehr, RN.  Corlis Angelica L. Bradlee Bridgers, RN, CDN

## 2013-02-24 NOTE — Progress Notes (Signed)
Notifed Dr. Darrick Penna due the patient wanting to eat a more solid foods than soup and puddings.  I voiced to her that the patient had only one small dark stool recently.  The patient has not complained of any pain.  He did tolerate his full liquid diet without difficult AEB no pain complaint, nausea, vomiting, or diarrhea.  New orders given and followed.

## 2013-02-24 NOTE — Consult Note (Addendum)
Referring Provider: No ref. provider found Primary Care Physician:  Ignatius Specking., MD Primary Gastroenterologist:  DR. Karilyn Cota  Reason for Consultation:  ANEMIA/MELENA  HPI:  PT WITH KNOWN HISTRORY OF GAVE/ S/P [facility] Feb 14 2013. PRESENTED TO ED WITH CONTINUED BLACK  STOOLS. 4-5 ON THUR. LARGE BLACK MOVEMENT FRI AM. A FEW SMALL ONES SINCE ADMISSION. R SIDED ABD PAIN. HEARTBURN. STAYS COLD. CURRENTLY BEING DIALYZED. PT DENIES FEVER, CHILLS, BRBPR, nausea, vomiting, diarrhea, constipation, OR problems swallowing.    Past Medical History  Diagnosis Date  . Rectal bleeding   . Dialysis care   . GI bleed   . Hypertension   . Esophageal varices   . Alcoholic liver disease   . Hepatic encephalopathy   . Ascites   . Diabetes mellitus   . Detached retina   . Renal disorder   . Pneumonia     Past Surgical History  Procedure Laterality Date  . Esophagogastroduodenoscopy  12/31/2010    EGD [facility] THERAPY  . Esophagogastroduodenoscopy  05/31/2012E    GD [facility] ABLATION  . Small bowel givens  12/01/2010  . Colonoscopy  09/07/2010  . Esophagogastroduodenoscopy  09/05/2010    OUTLAW  . Lung surgery  6/11    [place]  . Esophagogastroduodenoscopy  03/26/2011    Procedure: ESOPHAGOGASTRODUODENOSCOPY (EGD);  Surgeon: Malissa Hippo, MD;  Location[facility];  Service: Endoscopy;  Laterality: N/A;  8:30 am  . Hot hemostasis  03/26/2011    Procedure: HOT HEMOSTASIS (ARGON PLASMA COAGULATION/BICAP);  Surgeon: Malissa Hippo, MD;  Location: [facility];  Service: Endoscopy;  Laterality: N/A;  . Stent in liver    . Esophagogastroduodenoscopy N/A 10/24/2012    Procedure: ESOPHAGOGASTRODUODENOSCOPY (EGD);  Surgeon: Malissa Hippo, MD;  Location: [facility];  Service: Endoscopy;  Laterality: N/A;  . Esophagogastroduodenoscopy N/A 11/22/2012    Procedure: ESOPHAGOGASTRODUODENOSCOPY (EGD);  Surgeon: Charna Elizabeth, MD;  Location[facility] ENDOSCOPY;  Service: Endoscopy;  Laterality: N/A;  .  Paracentesis    . Esophagogastroduodenoscopy N/A 12/21/2012    Procedure: ESOPHAGOGASTRODUODENOSCOPY (EGD);  Surgeon: Malissa Hippo, MD;  Location: [facility];  Service: Endoscopy;  Laterality: N/A;  250-moved to 155 Ann to notify pt  . Hot hemostasis N/A 12/21/2012    Procedure: HOT HEMOSTASIS (ARGON PLASMA COAGULATION/BICAP);  Surgeon: Malissa Hippo, MD;  Location: [facility];  Service: Endoscopy;  Laterality: N/A;  . Esophagogastroduodenoscopy N/A 01/25/2013    Procedure: ESOPHAGOGASTRODUODENOSCOPY (EGD);  Surgeon: Malissa Hippo, MD;  [facility];  Service: Endoscopy;  Laterality: N/A;  100  . Hot hemostasis N/A 01/25/2013    Procedure: HOT HEMOSTASIS (ARGON PLASMA COAGULATION/BICAP);  Surgeon: Malissa Hippo, MD;  [facility];  Service: Endoscopy;  Laterality: N/A;  . Esophagogastroduodenoscopy N/A 02/16/2013    Procedure: ESOPHAGOGASTRODUODENOSCOPY (EGD);  Surgeon: Malissa Hippo, MD;  [facility];  Service: Endoscopy;  Laterality: N/A;    Prior to Admission medications   Medication Sig Start Date End Date Taking? Authorizing Provider  b complex-vitamin c-folic acid (NEPHRO-VITE) 0.8 MG TABS Take 1 tablet by mouth at bedtime. 11/25/12  Yes Christiane Ha, MD  ciprofloxacin (CIPRO) 500 MG tablet Take 1 tablet (500 mg total) by mouth daily. 01/18/13  Yes Len Blalock, NP  esomeprazole (NEXIUM) 40 MG capsule Take 1 capsule (40 mg total) by mouth daily before breakfast. 01/25/13  Yes Malissa Hippo, MD  furosemide (LASIX) 80 MG tablet Take 80 mg by mouth 2 (two)  times daily. 02/19/13  Yes Wilson Singer, MD  hydrocortisone valerate ointment (WESTCORT) 0.2 % Apply 1 application topically every Monday, Wednesday, and Friday. For numbing prior to dialysis   Yes Historical Provider, MD  lactulose (CHRONULAC) 10 GM/15ML solution Take 20 g by mouth daily as needed (for constipation). 11/25/12  Yes Christiane Ha, MD  megestrol (MEGACE) 40 MG tablet  Take 1 tablet (40 mg total) by mouth 4 (four) times daily. 01/18/13  Yes Len Blalock, NP  ondansetron (ZOFRAN) 4 MG tablet Take 4 mg by mouth daily as needed for nausea.    Yes Historical Provider, MD  sucralfate (CARAFATE) 1 GM/10ML suspension Take 10 mLs (1 g total) by mouth 4 (four) times daily. 01/15/13  Yes Flint Melter, MD  traMADol (ULTRAM) 50 MG tablet Take 50 mg by mouth 3 (three) times daily as needed for pain.    Yes Historical Provider, MD    Current Facility-Administered Medications  Medication Dose Route Frequency Provider Last Rate Last Dose  . 0.9 %  sodium chloride infusion  100 mL Intravenous PRN Jamse Mead, MD      . 0.9 %  sodium chloride infusion  100 mL Intravenous PRN Jamse Mead, MD      . albuterol (PROVENTIL) (5 MG/ML) 0.5% nebulizer solution 2.5 mg  2.5 mg Nebulization Q2H PRN Elease Etienne, MD      . alteplase (CATHFLO ACTIVASE) injection 2 mg  2 mg Intracatheter Once PRN Jamse Mead, MD      . ciprofloxacin (CIPRO) tablet 500 mg  500 mg Oral Daily Elease Etienne, MD   500 mg at 02/23/13 1554  . epoetin alfa (EPOGEN,PROCRIT) injection 10,000 Units  10,000 Units Intravenous Q T,Th,Sa-HD Richarda Overlie, MD      . feeding supplement (NEPRO CARB STEADY) liquid 237 mL  237 mL Oral PRN Jamse Mead, MD      . furosemide (LASIX) tablet 80 mg  80 mg Oral BID Elease Etienne, MD   80 mg at 02/23/13 2139  . heparin injection 1,000 Units  1,000 Units Dialysis PRN Jamse Mead, MD      . hydrocortisone valerate cream (WESTCORT) 0.2 %   Topical Q M,W,F Elease Etienne, MD      . insulin aspart (novoLOG) injection 0-9 Units  0-9 Units Subcutaneous Q4H Elease Etienne, MD      . lactulose (CHRONULAC) 10 GM/15ML solution 20 g  20 g Oral Daily PRN Elease Etienne, MD      . lidocaine (PF) (XYLOCAINE) 1 % injection 5 mL  5 mL Intradermal PRN Jamse Mead, MD      . lidocaine-prilocaine (EMLA) cream 1 application  1 application  Topical PRN Jamse Mead, MD      . megestrol (MEGACE) tablet 40 mg  40 mg Oral QID Elease Etienne, MD   40 mg at 02/23/13 2139  . ondansetron (ZOFRAN) tablet 4 mg  4 mg Oral Q6H PRN Elease Etienne, MD       Or  . ondansetron [facility]) injection 4 mg  4 mg Intravenous Q6H PRN Elease Etienne, MD   4 mg at 02/23/13 2152  . pantoprazole (PROTONIX) 80 mg in sodium chloride 0.9 % 100 mL IVPB  80 mg Intravenous Once Elease Etienne, MD      . pantoprazole (PROTONIX) 80 mg in sodium chloride 0.9 % 250 mL infusion  8 mg/hr Intravenous Continuous Theadora Rama  D Hongalgi, MD 25 mL/hr at 02/23/13 1900 8 mg/hr at 02/23/13 1900  . [START ON 02/27/2013] pantoprazole (PROTONIX) injection 40 mg  40 mg Intravenous Q12H Elease Etienne, MD      . pentafluoroprop-tetrafluoroeth (GEBAUERS) aerosol 1 application  1 application Topical PRN Jamse Mead, MD      . sevelamer carbonate (RENVELA) tablet 1,600 mg  1,600 mg Oral TID WC Jamse Mead, MD      . sodium chloride 0.9 % injection 3 mL  3 mL Intravenous Q12H Elease Etienne, MD   3 mL at 02/23/13 2145  . sucralfate (CARAFATE) 1 GM/10ML suspension 1 g  1 g Oral QID Elease Etienne, MD   1 g at 02/23/13 2141  . traMADol (ULTRAM) tablet 50 mg  50 mg Oral TID PRN Elease Etienne, MD   50 mg at 02/23/13 2139    Allergies as of 02/23/2013 - Review Complete 02/23/2013  Allergen Reaction Noted  . Tylenol [acetaminophen] Other (See Comments) 02/09/2011  . Morphine and related Itching 02/09/2011   History reviewed. No pertinent family history.   History   Social History  . Marital Status: Divorced    Spouse Name: N/A    Number of Children: N/A  . Years of Education: N/A   Social History Main Topics  . Smoking status: Former Smoker -- 1.00 packs/day for 30 years    Types: Cigarettes    Quit date: 08/11/2011  . Smokeless tobacco: Former Neurosurgeon    Quit date: 05/12/2012  . Alcohol Use: No     Comment: denies-quit 2000  . Drug Use: No  .  Sexual Activity: Not on file   Review of Systems: PER HPI OTHERWISE ALL SYSTEMS ARE NEGATIVE.  Vitals: Blood pressure 108/68, pulse 118, temperature 98.2 F (36.8 C), temperature source Oral, resp. rate 18, weight 225 lb 1.4 oz (102.1 kg), SpO2 98.00%.  Physical Exam: General:   Alert,  cooperative in NAD Head:  Normocephalic and atraumatic. Eyes:  Sclera clear, no icterus.   Mouth:  No deformity or lesions, dentition ABnormal. Neck:  Supple; no masses or thyromegaly. Lungs:  Clear throughout to auscultation.   No wheezes. No acute distress. Heart:  Regular rate and rhythm; no murmurs. Abdomen:  Soft, nontender and distended. No masses noted. Normal bowel sounds, without guarding, and without rebound.   Msk:  Symmetrical  Extremities:  Without edema. BIL SCDs IN PLACE. Neurologic:  Alert and  oriented x4;  grossly normal neurologically. Cervical Nodes:  No significant cervical adenopathy. Psych:  Alert and cooperative. ANXIOUS mood and FLAT affect.  Lab Results:  Recent Labs  02/23/13 1746 02/24/13 0227 02/24/13 0832  WBC 9.7 8.9 9.1  HGB 7.4* 8.1* 8.1*  HCT 21.9* 23.4* 23.2*  PLT 127* 138* 135*   BMET  Recent Labs  02/23/13 1015 02/24/13 0227  NA 133* 136  K 3.5 3.3*  CL 97 101  CO2 21 22  GLUCOSE 128* 102*  BUN 72* 78*  CREATININE 7.68* 8.51*  CALCIUM 8.6 8.3*   LFT  Recent Labs  02/23/13 1015  PROT 5.7*  ALBUMIN 1.6*  AST 62*  ALT 23  ALKPHOS 113  BILITOT 0.7  BILIDIR 0.3  IBILI 0.4     Studies/Results: CXR: NACPD  Impression: MELENA/ANEMIA DUE TO GAVE/THROMBOCYTOPENIA.  Plan: 1. SUPPORTIVE CARE. TRANSFUSE PRN. 2. NO HEPARIN WITH DIALYSIS 3. AVOID ASA/NSAIDS/ANTICOAGULATION 4. BID PPI 5. CLEAR LIQUIDS 6. CONSIDER REPEAT EGD IF CONTINUES WITH TRANSFUSION DEPENDENT ANEMIA  LOS: 1 day   [facility]  02/24/2013, 10:56 AM

## 2013-02-25 LAB — GLUCOSE, CAPILLARY
Glucose-Capillary: 185 mg/dL — ABNORMAL HIGH (ref 70–99)
Glucose-Capillary: 160 mg/dL — ABNORMAL HIGH (ref 70–99)
Glucose-Capillary: 153 mg/dL — ABNORMAL HIGH (ref 70–99)

## 2013-02-25 LAB — TYPE AND SCREEN
Unit division: 0
Unit division: 0

## 2013-02-25 LAB — TROPONIN I: Troponin I: <0.3 ng/mL (ref <0.30)

## 2013-02-25 LAB — HEPATIC FUNCTION PANEL
ALT: 21 U/L (ref 0–53)
Bilirubin, Direct: 0.5 mg/dL — ABNORMAL HIGH (ref 0.0–0.3)
Indirect Bilirubin: 0.5 mg/dL (ref 0.3–0.9)

## 2013-02-25 LAB — BASIC METABOLIC PANEL
GFR calc Af Amer: 13 mL/min — ABNORMAL LOW (ref >90)
GFR calc non Af Amer: 12 mL/min — ABNORMAL LOW (ref >90)
Potassium: 3.5 mEq/L (ref 3.5–5.1)
Sodium: 138 mEq/L (ref 135–145)

## 2013-02-25 LAB — LACTIC ACID, PLASMA: Lactic Acid, Venous: 1.6 mmol/L (ref 0.5–2.2)

## 2013-02-25 LAB — CBC
Hemoglobin: 8.6 g/dL — ABNORMAL LOW (ref 13.0–17.0)
RBC: 2.71 MIL/uL — ABNORMAL LOW (ref 4.22–5.81)

## 2013-02-25 MED ORDER — HYDROMORPHONE HCL PF 1 MG/ML IJ SOLN
0.5000 mg | INTRAMUSCULAR | Status: DC | PRN
  Administered 2013-02-25: 0.5 mg via INTRAVENOUS

## 2013-02-25 MED ORDER — DEXTROSE 5 % IV SOLN
1.0000 g | INTRAVENOUS | Status: DC
  Administered 2013-02-25 – 2013-02-26 (×2): 1 g via INTRAVENOUS
  Filled 2013-02-25 (×4): qty 10

## 2013-02-25 MED ORDER — DEXTROSE 5 % IV SOLN: 2.0000 g | Freq: Three times a day (TID) | INTRAVENOUS | Status: DC

## 2013-02-25 MED ORDER — PANTOPRAZOLE SODIUM 40 MG PO TBEC
40.0000 mg | DELAYED_RELEASE_TABLET | Freq: Two times a day (BID) | ORAL | Status: DC
  Administered 2013-02-25 – 2013-02-27 (×3): 40 mg via ORAL
  Filled 2013-02-25 (×3): qty 1

## 2013-02-25 MED ORDER — HYDROMORPHONE HCL PF 1 MG/ML IJ SOLN
INTRAMUSCULAR | Status: AC
  Filled 2013-02-25: qty 1

## 2013-02-25 MED ORDER — MORPHINE SULFATE 2 MG/ML IJ SOLN
0.5000 mg | INTRAMUSCULAR | Status: DC | PRN
  Administered 2013-02-25 – 2013-02-27 (×8): 0.5 mg via INTRAVENOUS
  Filled 2013-02-25 (×9): qty 1

## 2013-02-25 NOTE — Progress Notes (Signed)
1200Text paged Dr Susie Cassette of the patient requesting to take Morphine instead of Dilaudid.  I reminded him that according to his chart there was an Morphine allergy.  He stated that his issue with the Morphine was that thought it made him itch.  He now states that he doesn't believe the Morphine made him itch, but his Phosphors was too high causing him to itch.  I placed in the text that the results she was waiting on from lab was available.  Also that there would be no paracentesis today.

## 2013-02-25 NOTE — Progress Notes (Signed)
Notified Dr. Susie Cassette of patients disgruntled feelings about his care.  He stated that nothing has been done for his stomach and so he wanted to be transferred to [facility].  He also is requesting for Dr. Karilyn Cota to come in to draw fluid off of his stomach.  He is not currently SOB at this time or appear to be in any distress, except for his complaint of chest pain.  Dr. Susie Cassette states that after looking at his labs he does not meet the requirements for an emergency  Paracentesis.  Report was given to Dagoberto Ligas, RN on 2a.  She verbalized understanding.  The patient was transferred out via bed by staff in stable condition.  New orders were given and reported to the receiving nurse.

## 2013-02-25 NOTE — Progress Notes (Addendum)
0830 Text paged Dr. Susie Cassette about the patient  c/o abdominal pain.  Pt request something IV so that it can work faster.  MD currently on the floor and will see the patient.  I will continue to monitor.   1000 Called Dr. Susie Cassette due to patients pain continuing in his abdomen.  He would like his ordered paracentetesis to be done today for relief than tomorrow.  She stated to call radiology to see if the test could be done today.  I recommended increasing his pain med and she stated that she would like to first start with looking at his recent ordered labs.  I will continue to monitor.  1034 Talked with Peyton Najjar in radiology and he stated that the Paracentesis would not be done today, but most likely tomorrow.

## 2013-02-25 NOTE — Progress Notes (Addendum)
Subjective: Since I last evaluated the patient PT IS COMPLAINING OF WORSENING ABDOMINAL PAIN, UNCONTROLLED REFLUX, AND CONTINUED BLACK STOOLS. DECREASED APPETITE. Nursing reports watery black stools.  Objective: Vital signs in last 24 hours: Temp:  [98 F (36.7 C)-99 F (37.2 C)] 98.3 F (36.8 C) (08/17 0400) Pulse Rate:  [113-123] 117 (08/16 1730) Resp:  [11-41] 12 (08/17 0600) BP: (108-143)/(46-69) 125/57 mmHg (08/17 0600) SpO2:  [99 %] 99 % (08/16 1145) Weight:  [215 lb 9.8 oz (97.8 kg)-218 lb 0.6 oz (98.9 kg)] 218 lb 0.6 oz (98.9 kg) (08/17 0500) Last BM Date: 02/23/13  Intake/Output from previous day: 08/16 0701 - 08/17 0700 In: 650 [I.V.:650] Out: 692 [Urine:100] Intake/Output this shift:    General appearance: alert, cooperative and mild distress Resp: clear to auscultation bilaterally Cardio: regular rate and rhythm GI: soft, MODERATE TTP, DISTENDED; bowel sounds normal;  Extremities: edema 1-2+ BILATERAL LEs  Lab Results:  Recent Labs  02/24/13 0227 02/24/13 0832 02/25/13 0445  WBC 8.9 9.1 7.9  HGB 8.1* 8.1* 8.6*  HCT 23.4* 23.2* 24.9*  PLT 138* 135* 139*   BMET  Recent Labs  02/23/13 1015 02/24/13 0227 02/25/13 0445  NA 133* 136 138  K 3.5 3.3* 3.5  CL 97 101 103  CO2 21 22 27   GLUCOSE 128* 102* 208*  BUN 72* 78* 38*  CREATININE 7.68* 8.51* 4.93*  CALCIUM 8.6 8.3* 8.3*   LFT  Recent Labs  02/23/13 1015  PROT 5.7*  ALBUMIN 1.6*  AST 62*  ALT 23  ALKPHOS 113  BILITOT 0.7  BILIDIR 0.3  IBILI 0.4   PT/INR  Recent Labs  02/23/13 1015  LABPROT 14.6  INR 1.16   Hepatitis Panel No results found for this basename: HEPBSAG, HCVAB, HEPAIGM, HEPBIGM,  in the last 72 hours C-Diff No results found for this basename: CDIFFTOX,  in the last 72 hours Fecal Lactopherrin No results found for this basename: FECLLACTOFRN,  in the last 72 hours  Studies/Results: Dg Chest Portable 1 View  02/23/2013   *RADIOLOGY REPORT*  Clinical Data:  Gastrointestinal bleeding  PORTABLE CHEST - 1 VIEW  Comparison: 02/16/2013  Findings: Cardiac silhouette upper normal in size.  Vascular pattern within normal limits.  Left lung is clear.  There is stable mild opacity at the right base extending over the costophrenic angle.  IMPRESSION: Right PICC line has been removed.  Stable mild right lung base opacity.   Original Report Authenticated By: Esperanza Heir, M.D.    Medications: I have reviewed the patient's current medications.  Assessment/Plan: ADMITTED WITH MILD ABDOMINAL PAIN AND NOW WORSE WITH OBVIOUS ASCITES. SX MAY BE DUE TO SBP, LESS LIKELY C DIFF COLITIS. MELENA LIKELY DUE TO GAVE/THROMBOCYTOPENIA.  PLAN: 1. BID PPI 2. SOFT DIET 3. ADD CEFOTAXIME. D/C CIPRO. 4. PARACENTESIS AUG 18. 5. PT WITH RECURRENT ASCITES AND TIPS LAST EVALUATED ?4 YRS AGO. CONSIDER IR EVALUATION OF TIPS DUE TO RECURRENT TENSE ASCITES/?TIPS STENOSIS. 6. DR. Karilyn Cota TO FOLLOW PT ON AUG 18. 7. D/C LASIX AND CARAFATE. 8. CHECK  CDIFF PCR.   LOS: 2 days   Jazsmin Couse 02/25/2013, 9:36 AM

## 2013-02-25 NOTE — Progress Notes (Addendum)
[facility] PROGRESS NOTE  Timothy Chan WJX:[id number] DOB: February 10, 1951 DOA: 02/23/2013 PCP: Ignatius Specking., MD  Assessment/Plan: Active Problems:   Alcoholic cirrhosis/remote TIPS   Diabetes mellitus type 2, uncontrolled, with complications   Acute blood loss anemia   Thrombocytopenia, unspecified   GAVE (gastric antral vascular ectasia)   Upper GI bleeding   ESRD on dialysis   1. Upper GI bleeding: Not sure if patient has a slow oozing upper GI bleed versus all his melena is from prior retained GI bleed.hx of GAVE syndrome hemoglobin stable after one unit. . Admitted to step down unit for close monitoring. Seen by Dr. Darrick Penna, no intervention planned at this time.Diet advanced. Discontinue regarding strep and switched to IV PPI 2. Acute blood loss anemia: Patient has chronic anemia from ESRD. Hemoglobin dropped 1 g from recent discharge. He is symptomatic with associated generalized weakness. Follow H&H closely and transfuse if hemoglobin is less than 8 g per DL. Mild sinus tachycardia likely secondary to this. Monitor on telemetry. 3. ESRD on MWF hemodialysis: Patient had shortened cycle of dialysis on Monday and missed whendialysis completely due to weakness and lack of assistance to bring him to dialysis. Hemodialysis yesterday.   4. Type II DM: SSI. 5. Frequent falls: Secondary to multiple medical problems and associated deconditioning and weakness. Patient apparently lives alone and states that he does not have enough help to assist with his home activities or bringing him to hemodialysis. PT OT consultation and may need social worker input. 6. Cirrhosis/thrombocytopenia/ascites: Follow daily CBCs. No indications for therapeutic tap at this time. 7. Hypertension: Controlled. 8. Abdominal pain likely secondary to ascites, will check lipase, hepatic panel, lactic acid, paracentesis in the morning    Code Status: full  Family Communication: family updated about patient's clinical  progress  Disposition Plan: Transfer to telemetry  Brief narrative:  HPI: Timothy Chan is a 62 y.o. male with history of ESRD on MWF hemodialysis, alcoholic liver disease, esophageal varices, hypertension, ascites, type II DM, was recently discharged from the hospital on 02/19/2013 from an episode of upper GI bleed. At that time he was admitted with hematemesis and melena. GI consulted and performed EGD which showed no evidence of gastric paresis. It did show portal gastropathy and multiple gastric antral vascular ectasia which were treated. This was one of several EGDs that he's had. Patient states that since discharge, he has continued to have 3-5 black tarry BMs daily with associated generalized weakness with he is unable to get off the bed or couch to his wheelchair and perform any home activities. He even missed his dialysis on Wednesday because there was nobody to help him. He complains of intermittent right mid quadrant abdominal pain which is vague and mild. He denies nausea or vomiting. He denies bright or dark blood in stools. No NSAID use. He states that he sustained about 5-6 falls at home without injuries or loss of consciousness, secondary to weakness. In the ED, hemoglobin has dropped from 8.5 on 8/11 to 7.5 today. Hospitalist admission requested.  Consultants:  nephrology Procedures:  HD  Antibiotics:  None      HPI/Subjective: abdominal pain,says that he had one episode of bleeding last night   Objective: Filed Vitals:   02/25/13 0300 02/25/13 0400 02/25/13 0500 02/25/13 0600  BP: 129/62 135/61 123/57 125/57  Pulse:      Temp:  98.3 F (36.8 C)    TempSrc:  Oral    Resp: 41 21 23 12   Weight:  98.9 kg (218 lb 0.6 oz)   SpO2:        Intake/Output Summary (Last 24 hours) at 02/25/13 4098 Last data filed at 02/25/13 0600  Gross per 24 hour  Intake    650 ml  Output    692 ml  Net    -42 ml    Exam:  General exam: Moderately built and poorly nourished chronically  ill-looking male patient, lying comfortably supine on the gurney in no obvious distress. Head, eyes and ENT: Nontraumatic and normocephalic. Pupils equally reacting to light and accommodation. Oral mucosa slightly dry. No blood or coffee-ground. Neck: Supple. No JVD, carotid bruit or thyromegaly. Lymphatics: No lymphadenopathy. Respiratory system: Slightly reduced breath sounds in the bases with bibasal occational crackles. Rest of lung fields are clear to auscultation. No increased work of breathing. Cardiovascular system: S1 and S2 heard, RRR. No JVD, murmurs, gallops, clicks. 1+ bilateral leg edema. Gastrointestinal system: Abdomen is moderately distended, not tense, mild right mid quadrant tenderness but without rigidity, guarding or rebound. Normal bowel sounds heard. No organomegaly or masses appreciated. Central nervous system: Alert and oriented. No focal neurological deficits. Extremities: Symmetric 5 x 5 power. Peripheral pulses symmetrically felt. Skin: No rashes or acute findings. Musculoskeletal system: Negative exam. Psychiatry: Pleasant and cooperative.    Data Reviewed: Basic Metabolic Panel:  Recent Labs Lab 02/19/13 0452 02/23/13 1015 02/24/13 0227 02/25/13 0445  NA 135 133* 136 138  K 3.5 3.5 3.3* 3.5  CL 101 97 101 103  CO2 23 21 22 27   GLUCOSE 202* 128* 102* 208*  BUN 38* 72* 78* 38*  CREATININE 5.98* 7.68* 8.51* 4.93*  CALCIUM 8.4 8.6 8.3* 8.3*  PHOS  --   --  7.1*  --     Liver Function Tests:  Recent Labs Lab 02/23/13 1015  AST 62*  ALT 23  ALKPHOS 113  BILITOT 0.7  PROT 5.7*  ALBUMIN 1.6*    Recent Labs Lab 02/23/13 1015  LIPASE 94*    Recent Labs Lab 02/23/13 1015  AMMONIA 37    CBC:  Recent Labs Lab 02/23/13 1015 02/23/13 1746 02/24/13 0227 02/24/13 0832 02/25/13 0445  WBC 10.9* 9.7 8.9 9.1 7.9  NEUTROABS 6.0  --   --   --   --   HGB 7.5* 7.4* 8.1* 8.1* 8.6*  HCT 22.8* 21.9* 23.4* 23.2* 24.9*  MCV 92.7 91.6 89.7 88.5 91.9  PLT  131* 127* 138* 135* 139*    Cardiac Enzymes: No results found for this basename: CKTOTAL, CKMB, CKMBINDEX, TROPONINI,  in the last 168 hours BNP (last 3 results)  Recent Labs  11/21/12 1145  PROBNP 1167.0*     CBG:  Recent Labs Lab 02/24/13 1140 02/24/13 1622 02/24/13 2001 02/25/13 0051 02/25/13 0409  GLUCAP 75 161* 153* 206* 190*    Recent Results (from the past 240 hour(s))  STOOL CULTURE     Status: None   Collection Time    02/18/13  3:25 AM      Result Value Range Status   Specimen Description STOOL   Final   Special Requests IMMUNE:COMPROMISED   Final   Culture     Final   Value: NO SALMONELLA, SHIGELLA, CAMPYLOBACTER, YERSINIA, OR E.COLI 0157:H7 ISOLATED     Note: REDUCED NORMAL FLORA PRESENT     Performed at [facility]   Report Status 02/22/2013 FINAL   Final     Studies: X-ray Chest Pa Or Ap  02/14/2013   *RADIOLOGY REPORT*  Clinical Data: Weakness  CHEST - 1 VIEW  Comparison: 01/15/2013  Findings: The cardiac shadow is stable.  The lungs are well-aerated bilaterally.  Some chronic changes are seen in the right lung base. The osseous structures are within normal limits.  IMPRESSION: No acute abnormality noted.   Original Report Authenticated By: Alcide Clever, M.D.   Dg Abd 1 View  02/18/2013   *RADIOLOGY REPORT*  Clinical Data: 62 year old male with abdominal pain and diarrhea  ABDOMEN - 1 VIEW  Comparison: 01/05/2013 and prior radiographs dating back to 09/09/2010  Findings: Scattered nondistended gas-filled loops of small bowel and colon noted. There is no evidence of bowel obstruction. A TIPS is again identified. No acute bony abnormalities are identified.  IMPRESSION: Nonspecific nonobstructive bowel gas pattern.   Original Report Authenticated By: Harmon Pier, M.D.   Dg Chest Portable 1 View  02/23/2013   *RADIOLOGY REPORT*  Clinical Data: Gastrointestinal bleeding  PORTABLE CHEST - 1 VIEW  Comparison: 02/16/2013  Findings: Cardiac silhouette upper  normal in size.  Vascular pattern within normal limits.  Left lung is clear.  There is stable mild opacity at the right base extending over the costophrenic angle.  IMPRESSION: Right PICC line has been removed.  Stable mild right lung base opacity.   Original Report Authenticated By: Esperanza Heir, M.D.   Dg Chest Port 1 View  02/16/2013   *RADIOLOGY REPORT*  Clinical Data: 62 year old male status post PICC line placement.  PORTABLE CHEST - 1 VIEW  Comparison: 02/14/2013 and earlier.  Findings: Portable AP upright view 1253 hours.  Right upper extremity approach PICC line, tip projects about 2 cm below the carina at the lower SVC level.  Stable lung volumes.  No pneumothorax.  Stable to mildly increased asymmetric reticulonodular opacity most pronounced in the mid and right lower lung.  Stable mild right lung base veiling opacity. Stable cardiac size and mediastinal contours.  Visualized tracheal air column is within normal limits.  IMPRESSION: 1.  Right PICC line catheter, tip at the lower SVC level. 2.  Stable to mildly increased right lung base opacity.   Original Report Authenticated By: Erskine Speed, M.D.    Scheduled Meds: . ciprofloxacin  500 mg Oral Daily  . epoetin alfa  10,000 Units Intravenous Q T,Th,Sa-HD  . feeding supplement  1 Container Oral TID BM  . furosemide  80 mg Oral BID  . hydrocortisone valerate cream   Topical Q M,W,F  . insulin aspart  0-9 Units Subcutaneous Q4H  . megestrol  40 mg Oral QID  . pantoprazole (PROTONIX) IV  80 mg Intravenous Once  . [START ON 02/27/2013] pantoprazole (PROTONIX) IV  40 mg Intravenous Q12H  . sevelamer carbonate  1,600 mg Oral TID WC  . sodium chloride  3 mL Intravenous Q12H  . sucralfate  1 g Oral QID   Continuous Infusions: . pantoprozole (PROTONIX) infusion 8 mg/hr (02/25/13 0600)    Active Problems:   Alcoholic cirrhosis/remote TIPS   Diabetes mellitus type 2, uncontrolled, with complications   Acute blood loss anemia    Thrombocytopenia, unspecified   GAVE (gastric antral vascular ectasia)   Upper GI bleeding   ESRD on dialysis    Time spent: 40 minutes   [facility]  [facility] Pager [phone]. If 8PM-8AM, please contact night-coverage at [email or web address], password [facility] 02/25/2013, 7:23 AM  LOS: 2 days

## 2013-02-25 NOTE — Progress Notes (Signed)
Updated Dr. Darrick Penna of the patients stool situation.  They continue to be watery, black and in moderate amounts.  New orders were given and followed.

## 2013-02-25 NOTE — Progress Notes (Signed)
Timothy Chan  MRN: [id number]  DOB/AGE: 11/13/1950 62 y.o.  Primary Care Physician:VYAS,DHRUV B., MD  Admit date: 02/23/2013  Chief Complaint:  Chief Complaint  Patient presents with  . GI Bleeding    S-Pt presented on  02/23/2013 with  Chief Complaint  Patient presents with  . GI Bleeding  .    Pt today feels the same.    Pt is upset about not being on the list for Liver transplant at [facility].  meds . ciprofloxacin  500 mg Oral Daily  . epoetin alfa  10,000 Units Intravenous Q T,Th,Sa-HD  . feeding supplement  1 Container Oral TID BM  . furosemide  80 mg Oral BID  . hydrocortisone valerate cream   Topical Q M,W,F  . insulin aspart  0-9 Units Subcutaneous Q4H  . megestrol  40 mg Oral QID  . pantoprazole (PROTONIX) IV  80 mg Intravenous Once  . [START ON 02/27/2013] pantoprazole (PROTONIX) IV  40 mg Intravenous Q12H  . sevelamer carbonate  1,600 mg Oral TID WC  . sodium chloride  3 mL Intravenous Q12H  . sucralfate  1 g Oral QID       Physical Exam: Vital signs in last 24 hours: Temp:  [98 F (36.7 C)-99 F (37.2 C)] 98.3 F (36.8 C) (08/17 0400) Pulse Rate:  [107-123] 117 (08/16 1730) Resp:  [11-41] 12 (08/17 0600) BP: (108-146)/(46-83) 125/57 mmHg (08/17 0600) SpO2:  [99 %] 99 % (08/16 1145) Weight:  [215 lb 9.8 oz (97.8 kg)-218 lb 0.6 oz (98.9 kg)] 218 lb 0.6 oz (98.9 kg) (08/17 0500) Weight change: -1.8 oz (-0.05 kg) Last BM Date: 02/23/13  Intake/Output from previous day: 08/16 0701 - 08/17 0700 In: 650 [I.V.:650] Out: 692 [Urine:100]     Physical Exam: General- pt is awake,alert, follows commands Resp- No acute REsp distress, Decreased BS at bases. CVS- S1S2 regular in rhythm GIT- BS+, soft, NT, ND EXT- NO LE Edema, Cyanosis Access- left AVF +  Lab Results: CBC  Recent Labs  02/24/13 0832 02/25/13 0445  WBC 9.1 7.9  HGB 8.1* 8.6*  HCT 23.2* 24.9*  PLT 135* 139*    BMET  Recent Labs  02/24/13 0227 02/25/13 0445  NA 136 138  K 3.3*  3.5  CL 101 103  CO2 22 27  GLUCOSE 102* 208*  BUN 78* 38*  CREATININE 8.51* 4.93*  CALCIUM 8.3* 8.3*    MICRO Recent Results (from the past 240 hour(s))  STOOL CULTURE     Status: None   Collection Time    02/18/13  3:25 AM      Result Value Range Status   Specimen Description STOOL   Final   Special Requests IMMUNE:COMPROMISED   Final   Culture     Final   Value: NO SALMONELLA, SHIGELLA, CAMPYLOBACTER, YERSINIA, OR E.COLI 0157:H7 ISOLATED     Note: REDUCED NORMAL FLORA PRESENT     Performed at [facility]   Report Status 02/22/2013 FINAL   Final      Lab Results  Component Value Date   CALCIUM 8.3* 02/25/2013   PHOS 7.1* 02/24/2013       Impression: 1)Renal  ESRD on HD On MWF schedule Pt was last Dialyzed yesterday as missed tx during week. Hx of NON compliance with HD   2)HTN  BP at goal   3)Anemia HGb not at goal (9--11) Admitted with GI bleed ( hx of GAVE) Also has Anemia of Ch Disease HGb improving.  4)Hyperphosphatemia Sec to ON compliance with Dietary regimen On Binders  5)Liver Hx of Alc Cirrhosis.  6)Electrolytes Normokalemic NOrmonatremic   7)Acid base Co2 at goal     Plan:  Continue current tx Will dialyze in am .      Quavis Klutz S 02/25/2013, 7:16 AM

## 2013-02-26 ENCOUNTER — Inpatient Hospital Stay (HOSPITAL_COMMUNITY): Payer: Medicare Other

## 2013-02-26 ENCOUNTER — Encounter (HOSPITAL_COMMUNITY): Payer: Self-pay

## 2013-02-26 DIAGNOSIS — R109 Unspecified abdominal pain: Secondary | ICD-10-CM

## 2013-02-26 DIAGNOSIS — D509 Iron deficiency anemia, unspecified: Secondary | ICD-10-CM

## 2013-02-26 DIAGNOSIS — K922 Gastrointestinal hemorrhage, unspecified: Secondary | ICD-10-CM

## 2013-02-26 LAB — GLUCOSE, CAPILLARY: Glucose-Capillary: 143 mg/dL — ABNORMAL HIGH (ref 70–99)

## 2013-02-26 LAB — COMPREHENSIVE METABOLIC PANEL
AST: 42 U/L — ABNORMAL HIGH (ref 0–37)
Albumin: 1.4 g/dL — ABNORMAL LOW (ref 3.5–5.2)
Alkaline Phosphatase: 127 U/L — ABNORMAL HIGH (ref 39–117)
BUN: 44 mg/dL — ABNORMAL HIGH (ref 6–23)
Chloride: 106 mEq/L (ref 96–112)
Creatinine, Ser: 5.58 mg/dL — ABNORMAL HIGH (ref 0.50–1.35)
Potassium: 3.4 mEq/L — ABNORMAL LOW (ref 3.5–5.1)
Total Bilirubin: 0.7 mg/dL (ref 0.3–1.2)
Total Protein: 5.3 g/dL — ABNORMAL LOW (ref 6.0–8.3)

## 2013-02-26 LAB — BODY FLUID CELL COUNT WITH DIFFERENTIAL
Eos, Fluid: 0 %
Lymphs, Fluid: 18 %
Monocyte-Macrophage-Serous Fluid: 70 % (ref 50–90)
Neutrophil Count, Fluid: 12 % (ref 0–25)
Total Nucleated Cell Count, Fluid: 12 cu mm (ref 0–1000)

## 2013-02-26 LAB — CBC
MCHC: 35 g/dL (ref 30.0–36.0)
Platelets: 135 10*3/uL — ABNORMAL LOW (ref 150–400)
RDW: 17 % — ABNORMAL HIGH (ref 11.5–15.5)
WBC: 8.2 10*3/uL (ref 4.0–10.5)

## 2013-02-26 MED ORDER — ALTEPLASE 2 MG IJ SOLR: 2.0000 mg | Freq: Once | INTRAMUSCULAR | Status: AC | PRN

## 2013-02-26 MED ORDER — SODIUM CHLORIDE 0.9 % IV SOLN: 100.0000 mL | INTRAVENOUS | Status: DC | PRN

## 2013-02-26 MED ORDER — MORPHINE SULFATE 2 MG/ML IJ SOLN
1.0000 mg | Freq: Once | INTRAMUSCULAR | Status: AC
  Administered 2013-02-26: 1 mg via INTRAVENOUS
  Filled 2013-02-26: qty 1

## 2013-02-26 MED ORDER — TECHNETIUM TO 99M ALBUMIN AGGREGATED
6.0000 | Freq: Once | INTRAVENOUS | Status: AC | PRN
  Administered 2013-02-26: 6 via INTRAVENOUS

## 2013-02-26 MED ORDER — TECHNETIUM TC 99M DIETHYLENETRIAME-PENTAACETIC ACID
40.0000 | Freq: Once | INTRAVENOUS | Status: AC | PRN
  Administered 2013-02-26: 40 via RESPIRATORY_TRACT

## 2013-02-26 NOTE — Telephone Encounter (Signed)
Patient is in the hospital

## 2013-02-26 NOTE — Clinical Social Work Placement (Signed)
    Clinical Social Work Department CLINICAL SOCIAL WORK PLACEMENT NOTE 02/26/2013  Patient:  Timothy Chan, Timothy Chan  Account Number:  [id number] Admit date:  02/23/2013  Clinical Social Worker:  Santa Genera, CLINICAL SOCIAL WORKER  Date/time:  02/26/2013 02:00 PM  Clinical Social Work is seeking post-discharge placement for this patient at the following level of care:   SKILLED NURSING   (*CSW will update this form in Epic as items are completed)   02/26/2013  Patient/family provided with [facility] Department of Clinical Social Work's list of facilities offering this level of care within the geographic area requested by the patient (or if unable, by the patient's family).  02/26/2013  Patient/family informed of their freedom to choose among providers that offer the needed level of care, that participate in Medicare, Medicaid or managed care program needed by the patient, have an available bed and are willing to accept the patient.  02/26/2013  Patient/family informed of [facility]' ownership interest in [facility], as well as of the fact that they are under no obligation to receive care at this facility.  PASARR submitted to EDS on 02/26/2013 PASARR number received from [employer] on 02/26/2013  FL2 transmitted to all facilities in geographic area requested by pt/family on  02/26/2013 FL2 transmitted to all facilities within larger geographic area on   Patient informed that his/her managed care company has contracts with or will negotiate with  certain facilities, including the following:     Patient/family informed of bed offers received:   Patient chooses bed at  Physician recommends and patient chooses bed at    Patient to be transferred to  on   Patient to be transferred to facility by   The following physician request were entered in Epic:   Additional Comments: Patient had preexisting PASARR  Santa Genera, LCSW Clinical Social Worker [phone])

## 2013-02-26 NOTE — Progress Notes (Signed)
Pt for left side paracentesis with Dr.Boles. Pt given lidocaine 1% 7ml by MD. Pt stable no signs of distress.

## 2013-02-26 NOTE — Care Management Note (Signed)
    Page 1 of 1   02/27/2013     2:46:34 PM   CARE MANAGEMENT NOTE 02/27/2013  Patient:  Timothy Chan, Timothy Chan   Account Number:  [id number]  Date Initiated:  02/26/2013  Documentation initiated by:  Sharrie Rothman  Subjective/Objective Assessment:   Pt readmitted with gi bleed and weakness. Pt lives alone but stated that he is having a hard time coping at home alone. Pt has a son that works and is unable to help pt and another son who lives out of state.     Action/Plan:   Pt unable to go to dialysis. Pt has w/c and is active with [facility]. Pt stated that he drives himself to dialysis but unable to get out of car into the clinic and back in car. Now requesting placement. CSW aware.   Anticipated DC Date:  03/02/2013   Anticipated DC Plan:  SKILLED NURSING FACILITY  In-house referral  Clinical Social Worker      DC Planning Services  CM consult      Choice offered to / List presented to:             Status of service:  Completed, signed off Medicare Important Message given?  YES (If response is "NO", the following Medicare IM given date fields will be blank) Date Medicare IM given:  02/27/2013 Date Additional Medicare IM given:    Discharge Disposition:  SKILLED NURSING FACILITY  Per UR Regulation:    If discussed at Long Length of Stay Meetings, dates discussed:    Comments:  02/27/13 1445 Arlyss Queen, RN BSN CM Pt discharged to [facility] today. CSW to arrange discharge to facility.  02/26/13 1310 Arlyss Queen, RN BSN CM

## 2013-02-26 NOTE — Progress Notes (Signed)
Subjective: Interval History: Patient complains of abdominal pain and also some nausea. He said he's has reflux is bothering him. He denies any difficulty breathing but his abdominal distention seems to be getting worse.  Objective: Vital signs in last 24 hours: Temp:  [98.1 F (36.7 C)-98.3 F (36.8 C)] 98.3 F (36.8 C) (08/17 2100) Pulse Rate:  [123] 123 (08/17 2100) BP: (131)/(74) 131/74 mmHg (08/17 2100) SpO2:  [96 %] 96 % (08/17 2100) Weight:  [101.5 kg (223 lb 12.3 oz)] 101.5 kg (223 lb 12.3 oz) (08/18 0530) Weight change: -0.6 kg (-1 lb 5.2 oz)  Intake/Output from previous day: 08/17 0701 - 08/18 0700 In: -  Out: 125 [Urine:125] Intake/Output this shift:    General appearance: alert, cooperative and no distress Resp: clear to auscultation bilaterally Cardio: regular rate and rhythm, S1, S2 normal, no murmur, click, rub or gallop GI: soft, non-tender; bowel sounds normal; no masses,  no organomegaly Extremities: extremities normal, atraumatic, no cyanosis or edema  Lab Results:  Recent Labs  02/24/13 0832 02/25/13 0445  WBC 9.1 7.9  HGB 8.1* 8.6*  HCT 23.2* 24.9*  PLT 135* 139*   BMET:   Recent Labs  02/24/13 0227 02/25/13 0445  NA 136 138  K 3.3* 3.5  CL 101 103  CO2 22 27  GLUCOSE 102* 208*  BUN 78* 38*  CREATININE 8.51* 4.93*  CALCIUM 8.3* 8.3*   No results found for this basename: PTH,  in the last 72 hours Iron Studies: No results found for this basename: IRON, TIBC, TRANSFERRIN, FERRITIN,  in the last 72 hours  Studies/Results: No results found.  I have reviewed the patient's current medications.  Assessment/Plan: Problem #1 end-stage renal disease patient presently on dialysis. 38 and creatinine is 4.93. His potassium is 3.5. Problem #2 anemia his hemoglobin is 8.6 and hematocrit is 24.9 better. Patient has received 2 units of blood transfusion. Problem #3 diabetes Problem #4 hypertension his blood pressure seems to be reasonably  controlled Problem #5 alcoholic cirrhosis  Problem #6 metabolic bone disease his calcium was in acceptable range but phosphorus is high. Patient is started on a binder. Problem #7 hepatic encephalopathy patient is alert and oriented. Problem #8 noncompliance with dialysis. Plan: We'll dialyze patient today. We'll check his CBC and basic metabolic panel and phosphrous  in the morning.   LOS: 3 days   Kagen Kunath S 02/26/2013,6:45 AM

## 2013-02-26 NOTE — Clinical Social Work Psychosocial (Signed)
    Clinical Social Work Department BRIEF PSYCHOSOCIAL ASSESSMENT 02/26/2013  Patient:  Timothy Chan, Timothy Chan     Account Number:  [id number]     Admit date:  02/23/2013  Clinical Social Worker:  Santa Genera, CLINICAL SOCIAL WORKER  Date/Time:  02/26/2013 01:30 PM  Referred by:  Physician  Date Referred:  02/26/2013 Referred for  SNF Placement   Other Referral:   Interview type:  Patient Other interview type:    PSYCHOSOCIAL DATA Living Status:  ALONE Admitted from facility:   Level of care:   Primary support name:  Ray Scribner Primary support relationship to patient:  CHILD, ADULT Degree of support available:   Limited, little family involvement at present    CURRENT CONCERNS Current Concerns  Post-Acute Placement   Other Concerns:    SOCIAL WORK ASSESSMENT / PLAN CSW met w patient at bedside, patient alert and oriented. Patient recently discharged [facility], lives alone and has difficulty getting to dialysis as he cannot get from car to dialysis center and has been unable to arrange or afford transport that will get him door to door.  Patient has great difficulty managing his care needs at home, has wife who now lives in [place], son and father are in the area but not invovled in his care.  Patient appears to have very limited social support.  Was placed at [facility] in spring 2014, but left AMA because he did not like the faciility. Is concerned about the quality of food in whatever facility he is placed in.  Wants facility in [place] as this is close to his home and his current dialysis center.  CSW explained placement process, including copays, patient agreeable to SNF bed search process.   Assessment/plan status:  Psychosocial Support/Ongoing Assessment of Needs Other assessment/ plan:   Information/referral to community resources:   SNF list    PATIENT'S/FAMILY'S RESPONSE TO PLAN OF CARE: Patien wants help in obtaining SNF rehab placecment.        Santa Genera,  LCSW Clinical Social Worker [phone])

## 2013-02-26 NOTE — Progress Notes (Addendum)
[facility] PROGRESS NOTE  Timothy Chan ZOX:[id number] DOB: 05-Jul-1951 DOA: 02/23/2013 PCP: Ignatius Specking., MD  Assessment/Plan: Active Problems:   Alcoholic cirrhosis/remote TIPS   Diabetes mellitus type 2, uncontrolled, with complications   Acute blood loss anemia   Thrombocytopenia, unspecified   GAVE (gastric antral vascular ectasia)   Upper GI bleeding   ESRD on dialysis    1. Upper GI bleeding: Not sure if patient has a slow oozing upper GI bleed versus all his melena is from prior retained GI bleed.hx of GAVE syndrome hemoglobin stable after one unit. . Admitted to step down unit for close monitoring. Seen by Dr. Darrick Penna, no intervention planned at this time.Diet advanced. Patient may need a repeat TIPS procedure   2. Acute blood loss anemia: Patient has chronic anemia from ESRD. Hemoglobin dropped 1 g from recent discharge. He is symptomatic with associated generalized weakness. Follow H&H closely and transfuse if hemoglobin is less than 8 g per DL.    3. Abdominal pain likely secondary to tense ascites, status post paracentesis and removal of 2 L of fluid, liver function, likely stable, because of diarrhea we'll check a C. difficile PCR, pending. SBP prophylaxis with cefotaxime   4. ESRD on MWF hemodialysis: Patient had shortened cycle of dialysis on Monday and missed whendialysis completely due to weakness and lack of assistance to bring him to dialysis. Hemodialysis today 5. Type II DM: SSI. 6. Frequent falls: Secondary to multiple medical problems and associated deconditioning and weakness. Patient apparently lives alone and states that he does not have enough help to assist with his home activities or bringing him to hemodialysis. PT OT consultation and may need social worker input. 7. Cirrhosis/thrombocytopenia/ascites: Follow daily CBCs. No indications for therapeutic tap at this time. 8. Hypertension: Controlled. 9. Abdominal pain likely secondary to ascites, will  check lipase, hepatic panel, lactic acid, paracentesis in the morning   Code Status: full  Family Communication: family updated about patient's clinical progress  Disposition Plan: Transfer to telemetry  Brief narrative:  HPI: Timothy Chan is a 62 y.o. male with history of ESRD on MWF hemodialysis, alcoholic liver disease, esophageal varices, hypertension, ascites, type II DM, was recently discharged from the hospital on 02/19/2013 from an episode of upper GI bleed. At that time he was admitted with hematemesis and melena. GI consulted and performed EGD which showed no evidence of gastric paresis. It did show portal gastropathy and multiple gastric antral vascular ectasia which were treated. This was one of several EGDs that he's had. Patient states that since discharge, he has continued to have 3-5 black tarry BMs daily with associated generalized weakness with he is unable to get off the bed or couch to his wheelchair and perform any home activities. He even missed his dialysis on Wednesday because there was nobody to help him. He complains of intermittent right mid quadrant abdominal pain which is vague and mild. He denies nausea or vomiting. He denies bright or dark blood in stools. No NSAID use. He states that he sustained about 5-6 falls at home without injuries or loss of consciousness, secondary to weakness. In the ED, hemoglobin has dropped from 8.5 on 8/11 to 7.5 today. Hospitalist admission requested.  Consultants:  nephrology Procedures:  HD  Antibiotics:  None      HPI/Subjective: Since I last evaluated the patient PT IS COMPLAINING OF WORSENING ABDOMINAL PAIN, UNCONTROLLED REFLUX, AND CONTINUED BLACK STOOLS. DECREASED APPETITE. Nursing reports watery black stools   Objective: Filed Vitals:  02/26/13 0900 02/26/13 1000 02/26/13 1032 02/26/13 1059  BP: 138/68 134/80 108/60 110/66  Pulse: 124 120 132 127  Temp:  98.2 F (36.8 C)  98.3 F (36.8 C)  TempSrc:  Oral  Oral  Resp:     20  Height:      Weight:      SpO2:  97% 97% 97%    Intake/Output Summary (Last 24 hours) at 02/26/13 1212 Last data filed at 02/25/13 1630  Gross per 24 hour  Intake      0 ml  Output    125 ml  Net   -125 ml    Exam:  General appearance: alert, cooperative and no distress  Resp: clear to auscultation bilaterally  Cardio: regular rate and rhythm, S1, S2 normal, no murmur, click, rub or gallop  GI: soft, non-tender; bowel sounds normal; no masses, no organomegaly  Extremities: extremities normal, atraumatic, no cyanosis or edema       Data Reviewed: Basic Metabolic Panel:  Recent Labs Lab 02/23/13 1015 02/24/13 0227 02/25/13 0445 02/26/13 0455  NA 133* 136 138 142  K 3.5 3.3* 3.5 3.4*  CL 97 101 103 106  CO2 21 22 27 25   GLUCOSE 128* 102* 208* 134*  BUN 72* 78* 38* 44*  CREATININE 7.68* 8.51* 4.93* 5.58*  CALCIUM 8.6 8.3* 8.3* 8.7  PHOS  --  7.1*  --   --     Liver Function Tests:  Recent Labs Lab 02/23/13 1015 02/25/13 0445 02/26/13 0455  AST 62* 54* 42*  ALT 23 21 18   ALKPHOS 113 133* 127*  BILITOT 0.7 1.0 0.7  PROT 5.7* 5.5* 5.3*  ALBUMIN 1.6* 1.5* 1.4*    Recent Labs Lab 02/23/13 1015 02/25/13 0445  LIPASE 94* 64*    Recent Labs Lab 02/23/13 1015  AMMONIA 37    CBC:  Recent Labs Lab 02/23/13 1015 02/23/13 1746 02/24/13 0227 02/24/13 0832 02/25/13 0445 02/26/13 0455  WBC 10.9* 9.7 8.9 9.1 7.9 8.2  NEUTROABS 6.0  --   --   --   --   --   HGB 7.5* 7.4* 8.1* 8.1* 8.6* 8.2*  HCT 22.8* 21.9* 23.4* 23.2* 24.9* 23.4*  MCV 92.7 91.6 89.7 88.5 91.9 93.2  PLT 131* 127* 138* 135* 139* 135*    Cardiac Enzymes:  Recent Labs Lab 02/25/13 1819  TROPONINI <0.30   BNP (last 3 results)  Recent Labs  11/21/12 1145  PROBNP 1167.0*     CBG:  Recent Labs Lab 02/25/13 1713 02/25/13 2011 02/26/13 0056 02/26/13 0434 02/26/13 0754  GLUCAP 160* 153* 143* 141* 131*    Recent Results (from the past 240 hour(s))  STOOL  CULTURE     Status: None   Collection Time    02/18/13  3:25 AM      Result Value Range Status   Specimen Description STOOL   Final   Special Requests IMMUNE:COMPROMISED   Final   Culture     Final   Value: NO SALMONELLA, SHIGELLA, CAMPYLOBACTER, YERSINIA, OR E.COLI 0157:H7 ISOLATED     Note: REDUCED NORMAL FLORA PRESENT     Performed at [facility]   Report Status 02/22/2013 FINAL   Final     Studies: X-ray Chest Pa Or Ap  02/14/2013   *RADIOLOGY REPORT*  Clinical Data: Weakness  CHEST - 1 VIEW  Comparison: 01/15/2013  Findings: The cardiac shadow is stable.  The lungs are well-aerated bilaterally.  Some chronic changes are seen in the right  lung base. The osseous structures are within normal limits.  IMPRESSION: No acute abnormality noted.   Original Report Authenticated By: Alcide Clever, M.D.   Dg Abd 1 View  02/18/2013   *RADIOLOGY REPORT*  Clinical Data: 62 year old male with abdominal pain and diarrhea  ABDOMEN - 1 VIEW  Comparison: 01/05/2013 and prior radiographs dating back to 09/09/2010  Findings: Scattered nondistended gas-filled loops of small bowel and colon noted. There is no evidence of bowel obstruction. A TIPS is again identified. No acute bony abnormalities are identified.  IMPRESSION: Nonspecific nonobstructive bowel gas pattern.   Original Report Authenticated By: Harmon Pier, M.D.   Dg Chest Portable 1 View  02/23/2013   *RADIOLOGY REPORT*  Clinical Data: Gastrointestinal bleeding  PORTABLE CHEST - 1 VIEW  Comparison: 02/16/2013  Findings: Cardiac silhouette upper normal in size.  Vascular pattern within normal limits.  Left lung is clear.  There is stable mild opacity at the right base extending over the costophrenic angle.  IMPRESSION: Right PICC line has been removed.  Stable mild right lung base opacity.   Original Report Authenticated By: Esperanza Heir, M.D.   Dg Chest Port 1 View  02/16/2013   *RADIOLOGY REPORT*  Clinical Data: 62 year old male status post  PICC line placement.  PORTABLE CHEST - 1 VIEW  Comparison: 02/14/2013 and earlier.  Findings: Portable AP upright view 1253 hours.  Right upper extremity approach PICC line, tip projects about 2 cm below the carina at the lower SVC level.  Stable lung volumes.  No pneumothorax.  Stable to mildly increased asymmetric reticulonodular opacity most pronounced in the mid and right lower lung.  Stable mild right lung base veiling opacity. Stable cardiac size and mediastinal contours.  Visualized tracheal air column is within normal limits.  IMPRESSION: 1.  Right PICC line catheter, tip at the lower SVC level. 2.  Stable to mildly increased right lung base opacity.   Original Report Authenticated By: Erskine Speed, M.D.    Scheduled Meds: . cefTRIAXone (ROCEPHIN)  IV  1 g Intravenous Q24H  . epoetin alfa  10,000 Units Intravenous Q T,Th,Sa-HD  . feeding supplement  1 Container Oral TID BM  . hydrocortisone valerate cream   Topical Q M,W,F  . insulin aspart  0-9 Units Subcutaneous Q4H  . pantoprazole (PROTONIX) IV  80 mg Intravenous Once  . pantoprazole  40 mg Oral BID AC  . sevelamer carbonate  1,600 mg Oral TID WC  . sodium chloride  3 mL Intravenous Q12H   Continuous Infusions:   Active Problems:   Alcoholic cirrhosis/remote TIPS   Diabetes mellitus type 2, uncontrolled, with complications   Acute blood loss anemia   Thrombocytopenia, unspecified   GAVE (gastric antral vascular ectasia)   Upper GI bleeding   ESRD on dialysis    Time spent: 40 minutes   [facility]  [facility] Pager [phone]. If 8PM-8AM, please contact night-coverage at [email or web address], password [facility] 02/26/2013, 12:12 PM  LOS: 3 days

## 2013-02-26 NOTE — Progress Notes (Signed)
Paracentesis complete 2000 mls amber colored abdominal fluid removed. No signs of distress

## 2013-02-26 NOTE — Progress Notes (Signed)
Subjective: Since I last evaluated the patient  Admitted Friday. He has been having black stools for greater than 10 days. He also c/o some epigastric pain and lower abdominal pain. He tells me on average he was having 3-5 black  stools a day and then some days he would not have a stool. There has been no fever or chills. No nausea or vomiting since discharge from recent admission. HX of GAVE.  Recently underwent and EGD with APC therapy this month.  He also tells me he has acid reflux and it will bubbles up into his esophagus, but is infrequent. He also has a hx of End stage liver disease with ascites and end stage kidney disease. He is requesting a paracentesis today.  He was recently in the hospital and discharged on the 11th of this month. He received 4 units of PRBCs while in the hospital last admission.   He did not go the dialysis on - Wednesday 02/21/2013 due to transportation reason.  #1 Hx of acute peritonitis in April of this year secondary to PD cannula.Gram stain revealed abundant WBC seen gram positive cocci in pairs.   EGD 02/16/2013: Anemia, upper gi bleed. Hx of Ens stage renal disease on dialysis and advanced cirrhosis. impression:  No evidence of dislocation or gastric varices.  Portal gastropathy.  Multiple gastric antral vascular ectasia. Several of these lesions were treated.  EGD/01/25/2013: Ulcerative esophagitis which is new since last EGD of 12/21/2012.  Portal gastropathy.  Gastric antral vascular ectasia. Multiple lesions are ablated with argon plasma coagulator.   Objective: Vital signs in last 24 hours: Temp:  [98.1 F (36.7 C)-98.3 F (36.8 C)] 98.3 F (36.8 C) (08/17 2100) Pulse Rate:  [123] 123 (08/17 2100) BP: (131)/(74) 131/74 mmHg (08/17 2100) SpO2:  [96 %] 96 % (08/17 2100) Weight:  [223 lb 12.3 oz (101.5 kg)] 223 lb 12.3 oz (101.5 kg) (08/18 0530) Last BM Date: 02/25/13 Intake/Output from previous day: 08/17 0701 - 08/18 0700 In: -  Out: 125  [Urine:125] Intake/Output this shift:    General appearance: alert   Alert and oriented. Skin warm and dry. Oral mucosa is moist.   . Sclera anicteric, conjunctivae is pink. Thyroid not enlarged. No cervical lymphadenopathy. Lungs clear. Heart regular rate and rhythm.  Abdomen is distended but not tense. Bowel sounds are positive. Unable to palpate liver. No abdominal masses felt. No tenderness.  2+ edema to lower extremities.    Lab Results:  Recent Labs  02/24/13 0832 02/25/13 0445 02/26/13 0455  WBC 9.1 7.9 8.2  HGB 8.1* 8.6* 8.2*  HCT 23.2* 24.9* 23.4*  PLT 135* 139* 135*   BMET  Recent Labs  02/24/13 0227 02/25/13 0445 02/26/13 0455  NA 136 138 142  K 3.3* 3.5 3.4*  CL 101 103 106  CO2 22 27 25   GLUCOSE 102* 208* 134*  BUN 78* 38* 44*  CREATININE 8.51* 4.93* 5.58*  CALCIUM 8.3* 8.3* 8.7   LFT  Recent Labs  02/25/13 0445 02/26/13 0455  PROT 5.5* 5.3*  ALBUMIN 1.5* 1.4*  AST 54* 42*  ALT 21 18  ALKPHOS 133* 127*  BILITOT 1.0 0.7  BILIDIR 0.5*  --   IBILI 0.5  --    PT/INR  Recent Labs  02/23/13 1015  LABPROT 14.6  INR 1.16   Hepatitis Panel No results found for this basename: HEPBSAG, HCVAB, HEPAIGM, HEPBIGM,  in the last 72 hours C-Diff No results found for this basename: CDIFFTOX,  in the last 72  hours Fecal Lactopherrin No results found for this basename: FECLLACTOFRN,  in the last 72 hours  Studies/Results: No results found.  Medications: I have reviewed the patient's current medications.  Assessment/Plan:#1. Upper GI bleed secondary to GAVE (Hx of melena).. Recent EGD with APC therapy on the 02/16/2013. Dr. Karilyn Cota is aware.  #2. Decompensated cirrhosis with ascites. Paracentesis planned for today with cultures. #3.ESRD. Patient is to have dialysis today.   #4. Abdominal pain diffuse: Presently on Rocephin 1 gm IV. Protonix 40 mg IV. Paracentesis is scheduled for today.    Further recommendations to follow.     LOS: 3 days    Yon Schiffman W 02/26/2013, 8:54 AM

## 2013-02-26 NOTE — Procedures (Signed)
   HEMODIALYSIS TREATMENT NOTE:  3.5 hour heparin-free dialysis comleted via left forearm AVF (15g/antegrade).  Goal of 1.5 liters was NOT met due to asymptomatic hypotension (SBP<90).  Tolerated removal of 1L with 45 minutes of interrupted ultrafiltration time.  All blood was reinfused.  Hemostasis was achieved within 12 minutes.  Report given to Helmut Muster, RN  Paulina Muchmore L. Celese Banner, RN, CDN

## 2013-02-26 NOTE — Progress Notes (Signed)
[facility] chart review completed.  

## 2013-02-26 NOTE — Procedures (Signed)
PreOperative Dx: Cirrhosis, ascites Postoperative Dx: Cirrhosis, ascites Procedure:   US guided paracentesis Radiologist:  Tyron Russell Anesthesia:  7ml of 1% lidocaine Specimen:  2000 ml of yellow ascitic fluid EBL:   < 1 ml Complications: None

## 2013-02-27 LAB — BASIC METABOLIC PANEL
BUN: 24 mg/dL — ABNORMAL HIGH (ref 6–23)
CO2: 27 mEq/L (ref 19–32)
Calcium: 8 mg/dL — ABNORMAL LOW (ref 8.4–10.5)
Creatinine, Ser: 3.79 mg/dL — ABNORMAL HIGH (ref 0.50–1.35)
Glucose, Bld: 131 mg/dL — ABNORMAL HIGH (ref 70–99)

## 2013-02-27 LAB — CBC
MCH: 31.9 pg (ref 26.0–34.0)
MCV: 95.3 fL (ref 78.0–100.0)
Platelets: 125 10*3/uL — ABNORMAL LOW (ref 150–400)
RDW: 17.9 % — ABNORMAL HIGH (ref 11.5–15.5)

## 2013-02-27 LAB — GLUCOSE, CAPILLARY
Glucose-Capillary: 119 mg/dL — ABNORMAL HIGH (ref 70–99)
Glucose-Capillary: 130 mg/dL — ABNORMAL HIGH (ref 70–99)
Glucose-Capillary: 131 mg/dL — ABNORMAL HIGH (ref 70–99)

## 2013-02-27 MED ORDER — SEVELAMER CARBONATE 800 MG PO TABS: 1600.0000 mg | ORAL_TABLET | Freq: Three times a day (TID) | ORAL | Status: DC

## 2013-02-27 MED ORDER — CIPROFLOXACIN HCL 500 MG PO TABS: 500.0000 mg | ORAL_TABLET | Freq: Every day | ORAL | Status: DC

## 2013-02-27 MED ORDER — PANTOPRAZOLE SODIUM 40 MG PO TBEC: 40.0000 mg | DELAYED_RELEASE_TABLET | Freq: Two times a day (BID) | ORAL | Status: AC

## 2013-02-27 NOTE — Plan of Care (Signed)
Problem: Discharge Progression Outcomes Goal: Stools guaiac negative No active bleeding currently.

## 2013-02-27 NOTE — Progress Notes (Addendum)
Patient ID: Timothy Chan, male   DOB: 11-18-1950, 62 y.o.   MRN: [id number] Feels better today. Underwent a paracentesis yesterday with removal of 2000cc ascitic fluid. Preliminary cultures negative for SBP. Presently covered with Rocephin.  There has been no fever.  Had BM this am. Could not tell me if it was black.  Hemoglobin 8.1 this am.    He occasionally has crampy abdominal pian.  Receiving IV MS every 4 hrs for pain. Blood sugar this am 130.    CBC    Component Value Date/Time   WBC 7.6 02/27/2013 0455   RBC 2.54* 02/27/2013 0455   HGB 8.1* 02/27/2013 0455   HCT 24.2* 02/27/2013 0455   PLT 125* 02/27/2013 0455   MCV 95.3 02/27/2013 0455   MCH 31.9 02/27/2013 0455   MCHC 33.5 02/27/2013 0455   RDW 17.9* 02/27/2013 0455   LYMPHSABS 2.7 02/23/2013 1015   MONOABS 1.3* 02/23/2013 1015   EOSABS 0.7 02/23/2013 1015   BASOSABS 0.1 02/23/2013 1015   Filed Vitals:   02/26/13 1930 02/26/13 1945 02/27/13 0510 02/27/13 0618  BP: 98/52 100/63  116/66  Pulse: 125 129  123  Temp:  98 F (36.7 C)  98.6 F (37 C)  TempSrc:  Oral  Oral  Resp:  20  20  Height:      Weight:   213 lb 9.6 oz (96.888 kg)   SpO2:  97%  98%   Assessment/Plan: Upper GI bleed secondary to GAVE. Hemoglobin 8.1. Dr. Karilyn Cota is aware. Decompensated cirrhosis with Ascites. Underwent a paracentesis yesterday. Preliminary studies are negative for SBP. May consider d/c Rocephin.    GI attending note; As above patient complains of intermittent left lower quadrant abdominal pain. He passed small amount of dark/black stool. Abdominal exam reveals distended abdomen with mild tenderness in the LLQ. As requested by patient I have called Dr. Pollyann Samples patient's hepatologist at [facility] have not heard back from him.

## 2013-02-27 NOTE — Clinical Social Work Placement (Signed)
Clinical Social Work Department CLINICAL SOCIAL WORK PLACEMENT NOTE 02/27/2013  Patient:  TARYN, NAVE  Account Number:  [id number] Admit date:  02/23/2013  Clinical Social Worker:  Santa Genera, CLINICAL SOCIAL WORKER  Date/time:  02/26/2013 02:00 PM  Clinical Social Work is seeking post-discharge placement for this patient at the following level of care:   SKILLED NURSING   (*CSW will update this form in Epic as items are completed)   02/26/2013  Patient/family provided with [facility] Department of Clinical Social Work's list of facilities offering this level of care within the geographic area requested by the patient (or if unable, by the patient's family).  02/26/2013  Patient/family informed of their freedom to choose among providers that offer the needed level of care, that participate in Medicare, Medicaid or managed care program needed by the patient, have an available bed and are willing to accept the patient.  02/26/2013  Patient/family informed of [facility]' ownership interest in [facility], as well as of the fact that they are under no obligation to receive care at this facility.  PASARR submitted to EDS on 02/26/2013 PASARR number received from EDS on 02/26/2013  FL2 transmitted to all facilities in geographic area requested by pt/family on  02/26/2013 FL2 transmitted to all facilities within larger geographic area on   Patient informed that his/her managed care company has contracts with or will negotiate with  certain facilities, including the following:     Patient/family informed of bed offers received:  02/27/2013 Patient chooses bed at [facility] Physician recommends and patient chooses bed at    Patient to be transferred to [facility] on  02/27/2013 Patient to be transferred to facility by family  The following physician request were entered in Epic:   Additional Comments: Patient had preexisting  PASARR  Derenda Fennel, LCSW [phone]

## 2013-02-27 NOTE — Progress Notes (Signed)
Report called to [facility] at [facility]. Information packet was sent with family which will be transporting pt to the facility today. IV d/c. Pt d/c via wheelchair, accompanied by NT. Sheryn Bison

## 2013-02-27 NOTE — Plan of Care (Signed)
Problem: Discharge Progression Outcomes Goal: Stools guaiac negative Outcome: Not Met (add Reason) Stools were positive at last check on 8/15.

## 2013-02-27 NOTE — Clinical Social Work Note (Signed)
CSW met with pt to discuss d/c plans. Thurston Hole, CSW working with pt yesterday faxed pt's information to only 3 facilties pt was interested in: [facility], [facility], and [facility]. All responded "no". He states under no circumstances would be go back to [facility] or [facility] where he has been before. CSW discussed that we could expand search to other counties or pt would have to go home. He states that he will not go out of [facility] and will have his brother come pick him up to go home. CSW notified RN and CM and paged MD to resume home health. D/C today.  Derenda Fennel, [place] [phone]

## 2013-02-27 NOTE — Clinical Social Work Note (Signed)
Pt changed his mind and decided to look at [facility] with encouragement from his brother. Facility offered bed to pt who accepts. Pt will transfer with his brother today. D/C summary faxed.   Derenda Fennel, [place] [phone]

## 2013-02-27 NOTE — Discharge Summary (Addendum)
Physician Discharge Summary  Timothy Chan MRN: [id number] DOB/AGE: June 24, 1951 62 y.o.  PCP: Ignatius Specking., MD   Admit date: 02/23/2013 Discharge date: 02/27/2013  Discharge Diagnoses:      Alcoholic cirrhosis/remote TIPS   Diabetes mellitus type 2, uncontrolled, with complications   Acute blood loss anemia   Thrombocytopenia, unspecified   GAVE (gastric antral vascular ectasia)   Upper GI bleeding   ESRD on dialysis     Medication List    STOP taking these medications       furosemide 80 MG tablet  Commonly known as:  LASIX     megestrol 40 MG tablet  Commonly known as:  MEGACE      TAKE these medications       b complex-vitamin c-folic acid 0.8 MG Tabs tablet  Take 1 tablet by mouth at bedtime.     ciprofloxacin 500 MG tablet  Commonly known as:  CIPRO  Take 1 tablet (500 mg total) by mouth daily.     esomeprazole 40 MG capsule  Commonly known as:  NEXIUM  Take 1 capsule (40 mg total) by mouth daily before breakfast.     lactulose 10 GM/15ML solution  Commonly known as:  CHRONULAC  Take 20 g by mouth daily as needed (for constipation).     ondansetron 4 MG tablet  Commonly known as:  ZOFRAN  Take 4 mg by mouth daily as needed for nausea.     pantoprazole 40 MG tablet  Commonly known as:  PROTONIX  Take 1 tablet (40 mg total) by mouth 2 (two) times daily before a meal.     sevelamer carbonate 800 MG tablet  Commonly known as:  RENVELA  Take 2 tablets (1,600 mg total) by mouth 3 (three) times daily with meals.     sucralfate 1 GM/10ML suspension  Commonly known as:  CARAFATE  Take 10 mLs (1 g total) by mouth 4 (four) times daily.     traMADol 50 MG tablet  Commonly known as:  ULTRAM  Take 50 mg by mouth 3 (three) times daily as needed for pain.     WESTCORT 0.2 %  Generic drug:  hydrocortisone valerate ointment  Apply 1 application topically every Monday, Wednesday, and Friday. For numbing prior to dialysis        Discharge Condition:  Stable   Disposition: 01-Home or Self Care   Consults:  Gastroenterology Nephrology   Significant Diagnostic Studies: Dg Chest 1 View  02/26/2013   *RADIOLOGY REPORT*  Clinical Data: Tachycardia.  CHEST - 1 VIEW  Comparison: Chest 02/23/2013 and 02/16/2013.  Findings: Very small right pleural effusion and mild right basilar atelectasis are seen.  Left lung appears clear.  Heart size is upper normal.  No pneumothorax.  IMPRESSION: Very small right pleural effusion and mild basilar atelectasis.   Original Report Authenticated By: Holley Dexter, M.D.   X-ray Chest Pa Or Ap  02/14/2013   *RADIOLOGY REPORT*  Clinical Data: Weakness  CHEST - 1 VIEW  Comparison: 01/15/2013  Findings: The cardiac shadow is stable.  The lungs are well-aerated bilaterally.  Some chronic changes are seen in the right lung base. The osseous structures are within normal limits.  IMPRESSION: No acute abnormality noted.   Original Report Authenticated By: Alcide Clever, M.D.   Dg Abd 1 View  02/18/2013   *RADIOLOGY REPORT*  Clinical Data: 62 year old male with abdominal pain and diarrhea  ABDOMEN - 1 VIEW  Comparison: 01/05/2013 and prior radiographs dating back  to 09/09/2010  Findings: Scattered nondistended gas-filled loops of small bowel and colon noted. There is no evidence of bowel obstruction. A TIPS is again identified. No acute bony abnormalities are identified.  IMPRESSION: Nonspecific nonobstructive bowel gas pattern.   Original Report Authenticated By: Harmon Pier, M.D.   Nm Pulmonary Perf And Vent  02/26/2013   *RADIOLOGY REPORT*  Clinical Data:  62 year old male with shortness of breath and coughing  NUCLEAR MEDICINE VENTILATION - PERFUSION LUNG SCAN  Technique:  Ventilation images were obtained in multiple projections using inhaled aerosol technetium 99 M DTPA.  Perfusion images were obtained in multiple projections after intravenous injection of Tc-39m MAA.  Radiopharmaceuticals:  Tc-83m DTPA aerosol and  6.0 mCi Tc-43m MAA.  Comparison: 02/26/2013 chest radiograph  Findings:  Ventilation:  Heterogeneous ventilation is identified.  Perfusion:  Scattered nonsegmental heterogeneous areas of decreased perfusion are identified, but match ventilation defects.  IMPRESSION: Low probability for pulmonary embolus (less than 20%).   Original Report Authenticated By: Harmon Pier, M.D.   US Paracentesis  02/26/2013   *RADIOLOGY REPORT*  ULTRASOUND GUIDED PARACENTESIS:  Clinical Data:  Cirrhosis, tense ascites  Technique: After explanation of procedure, benefits, and risks, written informed consent was obtained. Time-out protocol was followed. Collection of ascites in left lower quadrant localized by ultrasound. Skin prepped and draped in usual sterile fashion. Skin and soft tissues anesthestized with 7 ml of 1% lidocaine. 5-French Yueh catheter placed into peritoneal cavity. 2000 ml of yellow colored fluid aspirated by vacuum bottle suction. Procedure tolerated well by patient without immediate complication.  IMPRESSION: Ultrasound-guided paracentesis of 2000 ml of ascitic fluid. Fluid sent to laboratory for requested analysis.   Original Report Authenticated By: Ulyses Southward, M.D.   Dg Chest Portable 1 View  02/23/2013   *RADIOLOGY REPORT*  Clinical Data: Gastrointestinal bleeding  PORTABLE CHEST - 1 VIEW  Comparison: 02/16/2013  Findings: Cardiac silhouette upper normal in size.  Vascular pattern within normal limits.  Left lung is clear.  There is stable mild opacity at the right base extending over the costophrenic angle.  IMPRESSION: Right PICC line has been removed.  Stable mild right lung base opacity.   Original Report Authenticated By: Esperanza Heir, M.D.   Dg Chest Port 1 View  02/16/2013   *RADIOLOGY REPORT*  Clinical Data: 62 year old male status post PICC line placement.  PORTABLE CHEST - 1 VIEW  Comparison: 02/14/2013 and earlier.  Findings: Portable AP upright view 1253 hours.  Right upper extremity approach  PICC line, tip projects about 2 cm below the carina at the lower SVC level.  Stable lung volumes.  No pneumothorax.  Stable to mildly increased asymmetric reticulonodular opacity most pronounced in the mid and right lower lung.  Stable mild right lung base veiling opacity. Stable cardiac size and mediastinal contours.  Visualized tracheal air column is within normal limits.  IMPRESSION: 1.  Right PICC line catheter, tip at the lower SVC level. 2.  Stable to mildly increased right lung base opacity.   Original Report Authenticated By: Erskine Speed, M.D.      Microbiology: Recent Results (from the past 240 hour(s))  STOOL CULTURE     Status: None   Collection Time    02/18/13  3:25 AM      Result Value Range Status   Specimen Description STOOL   Final   Special Requests IMMUNE:COMPROMISED   Final   Culture     Final   Value: NO SALMONELLA, SHIGELLA, CAMPYLOBACTER, YERSINIA, OR E.COLI 0157:H7  ISOLATED     Note: REDUCED NORMAL FLORA PRESENT     Performed at [facility]   Report Status 02/22/2013 FINAL   Final  ANAEROBIC CULTURE     Status: None   Collection Time    02/26/13 10:18 AM      Result Value Range Status   Specimen Description FLUID ASCITIC COLLECTED BY DOCTOR DR. Tyron Russell   Final   Special Requests NONE   Final   Gram Stain     Final   Value: NO WBC SEEN     NO ORGANISMS SEEN     Performed at [facility]   Culture PENDING   Incomplete   Report Status PENDING   Incomplete  BODY FLUID CULTURE     Status: None   Collection Time    02/26/13 10:18 AM      Result Value Range Status   Specimen Description FLUID ASCITIC COLLECTED BY DOCTOR BOLES   Final   Special Requests NONE   Final   Gram Stain     Final   Value: NO WBC SEEN     NO ORGANISMS SEEN     Performed at [facility]   Culture PENDING   Incomplete   Report Status PENDING   Incomplete     Labs: Results for orders placed during the hospital encounter of 02/23/13 (from the past 48 hour(s))   GLUCOSE, CAPILLARY     Status: Abnormal   Collection Time    02/25/13  8:17 AM      Result Value Range   Glucose-Capillary 185 (*) 70 - 99 mg/dL   Comment 1 Documented in Chart     Comment 2 Notify RN    LACTIC ACID, PLASMA     Status: None   Collection Time    02/25/13  9:50 AM      Result Value Range   Lactic Acid, Venous 1.6  0.5 - 2.2 mmol/L  GLUCOSE, CAPILLARY     Status: Abnormal   Collection Time    02/25/13 12:12 PM      Result Value Range   Glucose-Capillary 166 (*) 70 - 99 mg/dL   Comment 1 Documented in Chart     Comment 2 Notify RN    GLUCOSE, CAPILLARY     Status: Abnormal   Collection Time    02/25/13  5:13 PM      Result Value Range   Glucose-Capillary 160 (*) 70 - 99 mg/dL   Comment 1 Documented in Chart     Comment 2 Notify RN    TROPONIN I     Status: None   Collection Time    02/25/13  6:19 PM      Result Value Range   Troponin I <0.30  <0.30 ng/mL   Comment:            Due to the release kinetics of cTnI,     a negative result within the first hours     of the onset of symptoms does not rule out     myocardial infarction with certainty.     If myocardial infarction is still suspected,     repeat the test at appropriate intervals.  GLUCOSE, CAPILLARY     Status: Abnormal   Collection Time    02/25/13  8:11 PM      Result Value Range   Glucose-Capillary 153 (*) 70 - 99 mg/dL  GLUCOSE, CAPILLARY     Status: Abnormal   Collection  Time    02/26/13 12:56 AM      Result Value Range   Glucose-Capillary 143 (*) 70 - 99 mg/dL  GLUCOSE, CAPILLARY     Status: Abnormal   Collection Time    02/26/13  4:34 AM      Result Value Range   Glucose-Capillary 141 (*) 70 - 99 mg/dL  CBC     Status: Abnormal   Collection Time    02/26/13  4:55 AM      Result Value Range   WBC 8.2  4.0 - 10.5 K/uL   RBC 2.51 (*) 4.22 - 5.81 MIL/uL   Hemoglobin 8.2 (*) 13.0 - 17.0 g/dL   HCT 16.1 (*) 09.6 - 04.5 %   MCV 93.2  78.0 - 100.0 fL   MCH 32.7  26.0 - 34.0 pg   MCHC  35.0  30.0 - 36.0 g/dL   RDW 40.9 (*) 81.1 - 91.4 %   Platelets 135 (*) 150 - 400 K/uL  COMPREHENSIVE METABOLIC PANEL     Status: Abnormal   Collection Time    02/26/13  4:55 AM      Result Value Range   Sodium 142  135 - 145 mEq/L   Potassium 3.4 (*) 3.5 - 5.1 mEq/L   Chloride 106  96 - 112 mEq/L   CO2 25  19 - 32 mEq/L   Glucose, Bld 134 (*) 70 - 99 mg/dL   BUN 44 (*) 6 - 23 mg/dL   Creatinine, Ser 7.82 (*) 0.50 - 1.35 mg/dL   Calcium 8.7  8.4 - 95.6 mg/dL   Total Protein 5.3 (*) 6.0 - 8.3 g/dL   Albumin 1.4 (*) 3.5 - 5.2 g/dL   AST 42 (*) 0 - 37 U/L   ALT 18  0 - 53 U/L   Alkaline Phosphatase 127 (*) 39 - 117 U/L   Total Bilirubin 0.7  0.3 - 1.2 mg/dL   GFR calc non Af Amer 10 (*) >90 mL/min   GFR calc Af Amer 11 (*) >90 mL/min   Comment: (NOTE)     The eGFR has been calculated using the CKD EPI equation.     This calculation has not been validated in all clinical situations.     eGFR's persistently <90 mL/min signify possible Chronic Kidney     Disease.  GLUCOSE, CAPILLARY     Status: Abnormal   Collection Time    02/26/13  7:54 AM      Result Value Range   Glucose-Capillary 131 (*) 70 - 99 mg/dL   Comment 1 Notify RN    BODY FLUID CELL COUNT WITH DIFFERENTIAL     Status: None   Collection Time    02/26/13 10:18 AM      Result Value Range   Fluid Type-FCT FLUID     Comment: ASCITIC     COLLECTED BY DOCTOR     BOLES     CORRECTED ON 08/18 AT 1035: PREVIOUSLY REPORTED AS Body Fluid   Color, Fluid YELLOW  YELLOW   Appearance, Fluid CLOUDY  CLEAR   WBC, Fluid 12  0 - 1000 cu mm   Neutrophil Count, Fluid 12  0 - 25 %   Lymphs, Fluid 18     Monocyte-Macrophage-Serous Fluid 70  50 - 90 %   Eos, Fluid 0     Other Cells, Fluid CELLS     Comment: PENDING PATHOLOGIST REVIEW  ANAEROBIC CULTURE     Status: None   Collection Time  02/26/13 10:18 AM      Result Value Range   Specimen Description FLUID ASCITIC COLLECTED BY DOCTOR DR. Tyron Russell     Special Requests NONE      Gram Stain       Value: NO WBC SEEN     NO ORGANISMS SEEN     Performed at [facility]   Culture PENDING     Report Status PENDING    BODY FLUID CULTURE     Status: None   Collection Time    02/26/13 10:18 AM      Result Value Range   Specimen Description FLUID ASCITIC COLLECTED BY DOCTOR BOLES     Special Requests NONE     Gram Stain       Value: NO WBC SEEN     NO ORGANISMS SEEN     Performed at [facility]   Culture PENDING     Report Status PENDING    GLUCOSE, CAPILLARY     Status: Abnormal   Collection Time    02/26/13 12:21 PM      Result Value Range   Glucose-Capillary 129 (*) 70 - 99 mg/dL   Comment 1 Documented in Chart     Comment 2 Notify RN    GLUCOSE, CAPILLARY     Status: Abnormal   Collection Time    02/26/13  5:05 PM      Result Value Range   Glucose-Capillary 118 (*) 70 - 99 mg/dL   Comment 1 Documented in Chart     Comment 2 Notify RN    GLUCOSE, CAPILLARY     Status: None   Collection Time    02/26/13  9:13 PM      Result Value Range   Glucose-Capillary 99  70 - 99 mg/dL   Comment 1 Notify RN     Comment 2 Documented in Chart    GLUCOSE, CAPILLARY     Status: Abnormal   Collection Time    02/27/13 12:40 AM      Result Value Range   Glucose-Capillary 119 (*) 70 - 99 mg/dL   Comment 1 Notify RN    GLUCOSE, CAPILLARY     Status: Abnormal   Collection Time    02/27/13  3:38 AM      Result Value Range   Glucose-Capillary 131 (*) 70 - 99 mg/dL  BASIC METABOLIC PANEL     Status: Abnormal   Collection Time    02/27/13  4:55 AM      Result Value Range   Sodium 139  135 - 145 mEq/L   Potassium 4.1  3.5 - 5.1 mEq/L   Comment: DELTA CHECK NOTED   Chloride 102  96 - 112 mEq/L   CO2 27  19 - 32 mEq/L   Glucose, Bld 131 (*) 70 - 99 mg/dL   BUN 24 (*) 6 - 23 mg/dL   Creatinine, Ser 1.61 (*) 0.50 - 1.35 mg/dL   Calcium 8.0 (*) 8.4 - 10.5 mg/dL   GFR calc non Af Amer 16 (*) >90 mL/min   GFR calc Af Amer 18 (*) >90 mL/min   Comment:  (NOTE)     The eGFR has been calculated using the CKD EPI equation.     This calculation has not been validated in all clinical situations.     eGFR's persistently <90 mL/min signify possible Chronic Kidney     Disease.  CBC     Status: Abnormal   Collection Time  02/27/13  4:55 AM      Result Value Range   WBC 7.6  4.0 - 10.5 K/uL   RBC 2.54 (*) 4.22 - 5.81 MIL/uL   Hemoglobin 8.1 (*) 13.0 - 17.0 g/dL   HCT 14.7 (*) 82.9 - 56.2 %   MCV 95.3  78.0 - 100.0 fL   MCH 31.9  26.0 - 34.0 pg   MCHC 33.5  30.0 - 36.0 g/dL   RDW 13.0 (*) 86.5 - 78.4 %   Platelets 125 (*) 150 - 400 K/uL  PHOSPHORUS     Status: None   Collection Time    02/27/13  4:55 AM      Result Value Range   Phosphorus 3.4  2.3 - 4.6 mg/dL  GLUCOSE, CAPILLARY     Status: Abnormal   Collection Time    02/27/13  7:36 AM      Result Value Range   Glucose-Capillary 130 (*) 70 - 99 mg/dL   Comment 1 Notify RN       HPI :  Timothy Chan is a 62 y.o. male with history of ESRD on MWF hemodialysis, alcoholic liver disease, esophageal varices, hypertension, ascites, type II DM, was recently discharged from the hospital on 02/19/2013 from an episode of upper GI bleed. At that time he was admitted with hematemesis and melena. GI consulted and performed EGD which showed no evidence of gastric paresis. It did show portal gastropathy and multiple gastric antral vascular ectasia which were treated. This was one of several EGDs that he's had. Patient states that since discharge, he has continued to have 3-5 black tarry BMs daily with associated generalized weakness with he is unable to get off the bed or couch to his wheelchair and perform any home activities. He even missed his dialysis on Wednesday because there was nobody to help him. He complains of intermittent right mid quadrant abdominal pain which is vague and mild. He denies nausea or vomiting. He denies bright or dark blood in stools. No NSAID use. He states that he sustained  about 5-6 falls at home without injuries or loss of consciousness, secondary to weakness. In the ED, hemoglobin has dropped from 8.5 on 8/11 to 7.5 today. Hospitalist admission requested.  HOSPITAL COURSE:  1. Upper GI bleeding: Not sure if patient has a slowly oozing upper GI bleed versus all his melena is from prior retained GI bleed secondary to hx of GAVE syndrome hemoglobin stable after 2 unit. . Admitted to step down unit for close monitoring. Seen by Dr. Darrick Penna, no intervention planned at this time as he had EGD during his last admission.Diet advanced. Patient may need a repeat TIPS procedure, He will follow up with Dr Karilyn Cota.   3. Acute blood loss anemia: Patient has chronic anemia from ESRD. Hemoglobin dropped 1 g from recent discharge. He is symptomatic with associated generalized weakness. Hemoglobin currently stable after 2 units of packed red blood cells.   4. Abdominal pain likely secondary to tense ascites, status post paracentesis and removal of 2 L of fluid, liver function stable, because of diarrhea C. difficile PCR was checked and was negative. The patient does not have any diarrhea anymore. He was empirically started on Rocephin for SBP prophylaxis,. Cell count is pending. Patient will be placed on ciprofloxacin for SBP prophylaxis, he will resume his diuretics for his ascites   5. ESRD on MWF hemodialysis: Patient had shortened cycle of dialysis on Monday and missed whendialysis completely due to weakness and lack  of assistance to bring him to dialysis. Last dialysis on 8/18. Plan is for him to go to a skilled nursing facility so that he can be compliant with his dialysis   6. Type II DM: SSI. 7. Frequent falls: Secondary to multiple medical problems and associated deconditioning and weakness. Patient apparently lives alone and states that he does not have enough help to assist with his home activities or bringing him to hemodialysis. PT OT consultation and social worker  assisting with SNF placement 8. Cirrhosis/thrombocytopenia/ascites: Follow daily CBCs. Resume diuretics 9. Hypertension: Controlled. 10. Abdominal pain likely secondary to ascites, negative lipase, hepatic panel, lactic acid,  11.         Discharge Exam:  Blood pressure 116/66, pulse 123, temperature 98.6 F (37 C), temperature source Oral, resp. rate 20, height 6\' 3"  (1.905 m), weight 96.888 kg (213 lb 9.6 oz), SpO2 98.00%.  General appearance: alert, cooperative and mild distress  Resp: clear to auscultation bilaterally  Cardio: regular rate and rhythm  GI: soft, MODERATE TTP, DISTENDED; bowel sounds normal;  Extremities: edema 1-2+ BILATERAL LEs           Signed: Lejuan Botto 02/27/2013, 8:04 AM

## 2013-02-27 NOTE — Progress Notes (Signed)
Subjective: Interval History: Patient complains of abdominal pain and also some nausea. He said he's has reflux is bothering him. He still has dark stool  Objective: Vital signs in last 24 hours: Temp:  [98 F (36.7 C)-98.6 F (37 C)] 98.6 F (37 C) (08/19 0618) Pulse Rate:  [115-132] 123 (08/19 0618) Resp:  [19-20] 20 (08/19 0618) BP: (90-139)/(40-80) 116/66 mmHg (08/19 0618) SpO2:  [96 %-98 %] 98 % (08/19 0700) Weight:  [96.888 kg (213 lb 9.6 oz)-97.6 kg (215 lb 2.7 oz)] 96.888 kg (213 lb 9.6 oz) (08/19 0510) Weight change: -3.9 kg (-8 lb 9.6 oz)  Intake/Output from previous day: 08/18 0701 - 08/19 0700 In: 240 [P.O.:240] Out: 998  Intake/Output this shift:    General appearance: alert, cooperative and no distress Resp: clear to auscultation bilaterally Cardio: regular rate and rhythm, S1, S2 normal, no murmur, click, rub or gallop GI: soft, non-tender; bowel sounds normal; no masses,  no organomegaly Extremities: extremities normal, atraumatic, no cyanosis or edema  Lab Results:  Recent Labs  02/26/13 0455 02/27/13 0455  WBC 8.2 7.6  HGB 8.2* 8.1*  HCT 23.4* 24.2*  PLT 135* 125*   BMET:   Recent Labs  02/26/13 0455 02/27/13 0455  NA 142 139  K 3.4* 4.1  CL 106 102  CO2 25 27  GLUCOSE 134* 131*  BUN 44* 24*  CREATININE 5.58* 3.79*  CALCIUM 8.7 8.0*   No results found for this basename: PTH,  in the last 72 hours Iron Studies: No results found for this basename: IRON, TIBC, TRANSFERRIN, FERRITIN,  in the last 72 hours  Studies/Results: Dg Chest 1 View  02/26/2013   *RADIOLOGY REPORT*  Clinical Data: Tachycardia.  CHEST - 1 VIEW  Comparison: Chest 02/23/2013 and 02/16/2013.  Findings: Very small right pleural effusion and mild right basilar atelectasis are seen.  Left lung appears clear.  Heart size is upper normal.  No pneumothorax.  IMPRESSION: Very small right pleural effusion and mild basilar atelectasis.   Original Report Authenticated By: Holley Dexter, M.D.   Nm Pulmonary Perf And Vent  02/26/2013   *RADIOLOGY REPORT*  Clinical Data:  62 year old male with shortness of breath and coughing  NUCLEAR MEDICINE VENTILATION - PERFUSION LUNG SCAN  Technique:  Ventilation images were obtained in multiple projections using inhaled aerosol technetium 99 M DTPA.  Perfusion images were obtained in multiple projections after intravenous injection of Tc-37m MAA.  Radiopharmaceuticals:  Tc-26m DTPA aerosol and 6.0 mCi Tc-77m MAA.  Comparison: 02/26/2013 chest radiograph  Findings:  Ventilation:  Heterogeneous ventilation is identified.  Perfusion:  Scattered nonsegmental heterogeneous areas of decreased perfusion are identified, but match ventilation defects.  IMPRESSION: Low probability for pulmonary embolus (less than 20%).   Original Report Authenticated By: Harmon Pier, M.D.   US Paracentesis  02/26/2013   *RADIOLOGY REPORT*  ULTRASOUND GUIDED PARACENTESIS:  Clinical Data:  Cirrhosis, tense ascites  Technique: After explanation of procedure, benefits, and risks, written informed consent was obtained. Time-out protocol was followed. Collection of ascites in left lower quadrant localized by ultrasound. Skin prepped and draped in usual sterile fashion. Skin and soft tissues anesthestized with 7 ml of 1% lidocaine. 5-French Yueh catheter placed into peritoneal cavity. 2000 ml of yellow colored fluid aspirated by vacuum bottle suction. Procedure tolerated well by patient without immediate complication.  IMPRESSION: Ultrasound-guided paracentesis of 2000 ml of ascitic fluid. Fluid sent to laboratory for requested analysis.   Original Report Authenticated By: Ulyses Southward, M.D.  I have reviewed the patient's current medications.  Assessment/Plan: Problem #1 end-stage renal disease patient S/P  Dialysis yesterday. His BUN id . 24 and creatinine is 3.79. His potassium is 4.1 Problem #2 anemia his hemoglobin is 8.61and hematocrit is 24.2  Stable.  Patient  has received 2 units of blood transfusion. Problem #3 diabetes Problem #4 hypertension his blood pressure seems to be reasonably controlled Problem #5 alcoholic cirrhosis  Problem #6 metabolic bone disease his calcium was in acceptable range and phospherous has iproved Patient is on a binder. Problem #7 hepatic encephalopathy patient is alert and oriented. Problem #8 noncompliance with dialysis. Plan: We'll dialyze patient tomorrow We'll check his CBC and basic metabolic panel  in the morning.   LOS: 4 days   Areon Cocuzza S 02/27/2013,7:56 AM

## 2013-02-27 NOTE — Plan of Care (Signed)
Problem: Discharge Progression Outcomes Goal: Home Health Care arrangements in place Outcome: Not Applicable Date Met:  02/27/13 Pt going to [facility].

## 2013-03-01 ENCOUNTER — Telehealth (INDEPENDENT_AMBULATORY_CARE_PROVIDER_SITE_OTHER): Payer: Self-pay | Admitting: *Deleted

## 2013-03-01 LAB — BODY FLUID CULTURE
Culture: NO GROWTH
Gram Stain: NONE SEEN

## 2013-03-01 NOTE — Telephone Encounter (Signed)
Noted; staff at [facility] follow H/H

## 2013-03-01 NOTE — Telephone Encounter (Addendum)
Timothy Chan was discharged from [facility] then sent to [facility]. Timothy Chan said he is still bleeding and they have taken him off his fluid pill that has caused his leg to swell, now bursted open. Timothy Chan he needed to speak with the nurse there and have them call the doctor. He has and they keep telling him the doctor is on his way. Timothy Chan Dr. Karilyn Cota doesn't go to [facility] and he really needed to get the nurse their to have  someone check on him.

## 2013-03-01 NOTE — Telephone Encounter (Signed)
This is correct recommendation given to the patient. He is a resident at [facility] and the Nurse and Physician there can assist Timothy Chan. Dr.Rehman will be made aware.

## 2013-03-03 LAB — ANAEROBIC CULTURE

## 2013-03-05 ENCOUNTER — Encounter (HOSPITAL_COMMUNITY): Payer: Self-pay

## 2013-03-05 ENCOUNTER — Inpatient Hospital Stay (HOSPITAL_COMMUNITY)
Admission: EM | Admit: 2013-03-05 | Discharge: 2013-03-16 | DRG: 377 | Disposition: A | Discharge status: Skilled Nursing Facility | Payer: Medicare Other | Attending: Family Medicine | Admitting: Family Medicine

## 2013-03-05 ENCOUNTER — Non-Acute Institutional Stay (SKILLED_NURSING_FACILITY): Payer: Medicare Other | Admitting: Internal Medicine

## 2013-03-05 DIAGNOSIS — Z91158 Patient's noncompliance with renal dialysis for other reason: Secondary | ICD-10-CM

## 2013-03-05 DIAGNOSIS — M1990 Unspecified osteoarthritis, unspecified site: Secondary | ICD-10-CM | POA: Diagnosis present

## 2013-03-05 DIAGNOSIS — E43 Unspecified severe protein-calorie malnutrition: Secondary | ICD-10-CM | POA: Diagnosis present

## 2013-03-05 DIAGNOSIS — F1020 Alcohol dependence, uncomplicated: Secondary | ICD-10-CM | POA: Diagnosis present

## 2013-03-05 DIAGNOSIS — D500 Iron deficiency anemia secondary to blood loss (chronic): Secondary | ICD-10-CM | POA: Diagnosis present

## 2013-03-05 DIAGNOSIS — R109 Unspecified abdominal pain: Secondary | ICD-10-CM

## 2013-03-05 DIAGNOSIS — K31819 Angiodysplasia of stomach and duodenum without bleeding: Secondary | ICD-10-CM | POA: Diagnosis present

## 2013-03-05 DIAGNOSIS — Z87891 Personal history of nicotine dependence: Secondary | ICD-10-CM

## 2013-03-05 DIAGNOSIS — Z9115 Patient's noncompliance with renal dialysis: Secondary | ICD-10-CM

## 2013-03-05 DIAGNOSIS — N186 End stage renal disease: Secondary | ICD-10-CM | POA: Diagnosis present

## 2013-03-05 DIAGNOSIS — K219 Gastro-esophageal reflux disease without esophagitis: Secondary | ICD-10-CM | POA: Diagnosis present

## 2013-03-05 DIAGNOSIS — Z992 Dependence on renal dialysis: Secondary | ICD-10-CM | POA: Diagnosis present

## 2013-03-05 DIAGNOSIS — D62 Acute posthemorrhagic anemia: Secondary | ICD-10-CM | POA: Diagnosis present

## 2013-03-05 DIAGNOSIS — K5900 Constipation, unspecified: Secondary | ICD-10-CM | POA: Diagnosis present

## 2013-03-05 DIAGNOSIS — K7290 Hepatic failure, unspecified without coma: Secondary | ICD-10-CM | POA: Diagnosis present

## 2013-03-05 DIAGNOSIS — IMO0002 Reserved for concepts with insufficient information to code with codable children: Secondary | ICD-10-CM | POA: Diagnosis present

## 2013-03-05 DIAGNOSIS — D696 Thrombocytopenia, unspecified: Secondary | ICD-10-CM | POA: Diagnosis present

## 2013-03-05 DIAGNOSIS — R000 Tachycardia, unspecified: Secondary | ICD-10-CM | POA: Diagnosis present

## 2013-03-05 DIAGNOSIS — E1165 Type 2 diabetes mellitus with hyperglycemia: Secondary | ICD-10-CM | POA: Diagnosis present

## 2013-03-05 DIAGNOSIS — K7682 Hepatic encephalopathy: Secondary | ICD-10-CM | POA: Diagnosis present

## 2013-03-05 DIAGNOSIS — I8510 Secondary esophageal varices without bleeding: Secondary | ICD-10-CM | POA: Diagnosis present

## 2013-03-05 DIAGNOSIS — R188 Other ascites: Secondary | ICD-10-CM

## 2013-03-05 DIAGNOSIS — Z9181 History of falling: Secondary | ICD-10-CM

## 2013-03-05 DIAGNOSIS — Z6825 Body mass index (BMI) 25.0-25.9, adult: Secondary | ICD-10-CM

## 2013-03-05 DIAGNOSIS — K922 Gastrointestinal hemorrhage, unspecified: Secondary | ICD-10-CM

## 2013-03-05 DIAGNOSIS — K766 Portal hypertension: Secondary | ICD-10-CM

## 2013-03-05 DIAGNOSIS — K31811 Angiodysplasia of stomach and duodenum with bleeding: Principal | ICD-10-CM | POA: Diagnosis present

## 2013-03-05 DIAGNOSIS — K7030 Alcoholic cirrhosis of liver without ascites: Secondary | ICD-10-CM | POA: Diagnosis present

## 2013-03-05 DIAGNOSIS — G894 Chronic pain syndrome: Secondary | ICD-10-CM | POA: Diagnosis present

## 2013-03-05 DIAGNOSIS — I789 Disease of capillaries, unspecified: Secondary | ICD-10-CM | POA: Diagnosis present

## 2013-03-05 DIAGNOSIS — I959 Hypotension, unspecified: Secondary | ICD-10-CM | POA: Diagnosis present

## 2013-03-05 DIAGNOSIS — I120 Hypertensive chronic kidney disease with stage 5 chronic kidney disease or end stage renal disease: Secondary | ICD-10-CM | POA: Diagnosis present

## 2013-03-05 DIAGNOSIS — K319 Disease of stomach and duodenum, unspecified: Secondary | ICD-10-CM | POA: Diagnosis present

## 2013-03-05 DIAGNOSIS — IMO0001 Reserved for inherently not codable concepts without codable children: Secondary | ICD-10-CM | POA: Diagnosis present

## 2013-03-05 DIAGNOSIS — I509 Heart failure, unspecified: Secondary | ICD-10-CM | POA: Diagnosis present

## 2013-03-05 DIAGNOSIS — K208 Other esophagitis without bleeding: Secondary | ICD-10-CM | POA: Diagnosis present

## 2013-03-05 DIAGNOSIS — K769 Liver disease, unspecified: Secondary | ICD-10-CM | POA: Diagnosis present

## 2013-03-05 LAB — CBC WITH DIFFERENTIAL/PLATELET
Basophils Absolute: 0 10*3/uL (ref 0.0–0.1)
Eosinophils Absolute: 0.4 10*3/uL (ref 0.0–0.7)
HCT: 21.3 % — ABNORMAL LOW (ref 39.0–52.0)
Lymphocytes Relative: 23 % (ref 12–46)
Lymphs Abs: 2 10*3/uL (ref 0.7–4.0)
MCHC: 32.4 g/dL (ref 30.0–36.0)
Neutro Abs: 5 10*3/uL (ref 1.7–7.7)
RDW: 19.2 % — ABNORMAL HIGH (ref 11.5–15.5)

## 2013-03-05 LAB — BASIC METABOLIC PANEL
BUN: 34 mg/dL — ABNORMAL HIGH (ref 6–23)
Calcium: 8.6 mg/dL (ref 8.4–10.5)
Creatinine, Ser: 5.97 mg/dL — ABNORMAL HIGH (ref 0.50–1.35)
GFR calc Af Amer: 11 mL/min — ABNORMAL LOW (ref >90)
GFR calc non Af Amer: 9 mL/min — ABNORMAL LOW (ref >90)
Potassium: 3.7 mEq/L (ref 3.5–5.1)

## 2013-03-05 MED ORDER — SUCRALFATE 1 GM/10ML PO SUSP
1.0000 g | Freq: Four times a day (QID) | ORAL | Status: DC
  Administered 2013-03-05 – 2013-03-16 (×39): 1 g via ORAL
  Filled 2013-03-05 (×46): qty 10

## 2013-03-05 MED ORDER — PANTOPRAZOLE SODIUM 40 MG IV SOLR
40.0000 mg | Freq: Once | INTRAVENOUS | Status: AC
  Administered 2013-03-05: 40 mg via INTRAVENOUS
  Filled 2013-03-05: qty 40

## 2013-03-05 MED ORDER — SEVELAMER CARBONATE 800 MG PO TABS
1600.0000 mg | ORAL_TABLET | Freq: Three times a day (TID) | ORAL | Status: DC
  Administered 2013-03-06 – 2013-03-08 (×7): 1600 mg via ORAL
  Filled 2013-03-05 (×12): qty 2

## 2013-03-05 MED ORDER — SODIUM CHLORIDE 0.9 % IV SOLN
INTRAVENOUS | Status: DC
  Administered 2013-03-06: 03:00:00 via INTRAVENOUS

## 2013-03-05 MED ORDER — LACTULOSE 10 GM/15ML PO SOLN
20.0000 g | Freq: Every day | ORAL | Status: DC | PRN
  Administered 2013-03-08: 20 g via ORAL
  Filled 2013-03-05: qty 30

## 2013-03-05 MED ORDER — HYDROCORTISONE VALERATE 0.2 % EX OINT
1.0000 "application " | TOPICAL_OINTMENT | CUTANEOUS | Status: DC
  Administered 2013-03-07 – 2013-03-16 (×3): 1 via TOPICAL
  Filled 2013-03-05: qty 15

## 2013-03-05 MED ORDER — TRAMADOL HCL 50 MG PO TABS: 50.0000 mg | ORAL_TABLET | Freq: Three times a day (TID) | ORAL | Status: DC | PRN

## 2013-03-05 MED ORDER — FUROSEMIDE 80 MG PO TABS
80.0000 mg | ORAL_TABLET | Freq: Two times a day (BID) | ORAL | Status: DC
  Administered 2013-03-06 – 2013-03-16 (×20): 80 mg via ORAL
  Filled 2013-03-05 (×21): qty 1

## 2013-03-05 MED ORDER — MORPHINE SULFATE 2 MG/ML IJ SOLN
2.0000 mg | INTRAMUSCULAR | Status: DC | PRN
  Administered 2013-03-05 – 2013-03-16 (×32): 2 mg via INTRAVENOUS
  Filled 2013-03-05 (×34): qty 1

## 2013-03-05 MED ORDER — PANTOPRAZOLE SODIUM 40 MG IV SOLR
40.0000 mg | INTRAVENOUS | Status: DC
  Administered 2013-03-06 – 2013-03-15 (×9): 40 mg via INTRAVENOUS
  Filled 2013-03-05 (×9): qty 40

## 2013-03-05 MED ORDER — FOLIC ACID 1 MG PO TABS
1.0000 mg | ORAL_TABLET | Freq: Every day | ORAL | Status: DC
  Administered 2013-03-05 – 2013-03-15 (×11): 1 mg via ORAL
  Filled 2013-03-05 (×11): qty 1

## 2013-03-05 MED ORDER — ONDANSETRON HCL 4 MG/2ML IJ SOLN: 4.0000 mg | Freq: Four times a day (QID) | INTRAMUSCULAR | Status: DC | PRN

## 2013-03-05 MED ORDER — ONDANSETRON HCL 4 MG PO TABS
4.0000 mg | ORAL_TABLET | Freq: Four times a day (QID) | ORAL | Status: DC | PRN
  Administered 2013-03-16: 4 mg via ORAL
  Filled 2013-03-05: qty 1

## 2013-03-05 MED ORDER — SODIUM CHLORIDE 0.9 % IJ SOLN
3.0000 mL | Freq: Two times a day (BID) | INTRAMUSCULAR | Status: DC
  Administered 2013-03-07: 3 mL via INTRAVENOUS

## 2013-03-05 MED ORDER — FOLIC ACID 0.8 MG PO CAPS: 1.0000 | ORAL_CAPSULE | Freq: Every day | ORAL | Status: DC

## 2013-03-05 MED ORDER — ONDANSETRON HCL 4 MG PO TABS: 4.0000 mg | ORAL_TABLET | Freq: Every day | ORAL | Status: DC | PRN

## 2013-03-05 NOTE — ED Notes (Signed)
CRITICAL VALUE ALERT  Critical value received:  Hgb 6.9  Date of notification:  03/05/13  Time of notification:  1800  Critical value read back:yes  Nurse who received alert:  B. Garner Nash, RN  MD notified (1st page):  [place]  Time of first page:  1800  MD notified (2nd page):  Time of second page:  Responding MD:  Estell Harpin  Time MD responded:  1800

## 2013-03-05 NOTE — ED Notes (Signed)
Pt states he was not feeling well this morning and did not go to dialysis. States he had blood work this morning and was told his blood was low. Pt here eval and treatment

## 2013-03-05 NOTE — ED Provider Notes (Signed)
CSN: [id number]     Arrival date & time 03/05/13  1657 History  This chart was scribed for [name], MD, by Frederik Pear, ED scribe. The patient was seen in room APA09/APA09 and the patient's care was started at 1707.   First MD Initiated Contact with Patient 03/05/13 1707     Chief Complaint  Patient presents with  . Abnormal Lab   (Consider location/radiation/quality/duration/timing/severity/associated sxs/prior Treatment) Patient is a 62 y.o. male presenting with hematochezia. The history is provided by the patient and medical records. No language interpreter was used.  Rectal Bleeding Quality:  Black and tarry Amount:  Unable to specify Duration:  3 weeks Timing:  Intermittent Progression:  Unchanged Chronicity:  Recurrent Similar prior episodes: yes   Relieved by:  Nothing Associated symptoms: abdominal pain    HPI Comments: PRITHVI KOOI is a 63 y.o. male with a h/o of cirrhosis and anemia brought in by EMS who presents to the Emergency Department complaining of low hemoglobin levels of 6.1 that was performed today at [facility][name] He reports he was admitted to the hospital on 08/06 and from 08/15 to 08/19 because of black tarry stools. In the ED, he complains of severe abdominal pain. His most recent endoscopy was 08/07, which revealed an upper GI bleed. He reports the symptoms started 3 weeks ago have persisted even though he was discharged to [facility] on 08/19. He is on a Monday, Wednesday, Friday, dialysis scheduled, but was not dialyzed today.   GI is Dr. Karilyn Cota.  PCP is Dr. Sherril Croon.   Past Medical History  Diagnosis Date  . Rectal bleeding   . Dialysis care   . GI bleed   . Hypertension   . Esophageal varices   . Alcoholic liver disease   . Hepatic encephalopathy   . Ascites   . Diabetes mellitus   . Detached retina   . Renal disorder   . Pneumonia    Past Surgical History  Procedure Laterality Date  . Esophagogastroduodenoscopy  12/31/2010    EGD  [facility] THERAPY  . Esophagogastroduodenoscopy  05/31/2012E    GD [facility] ABLATION  . Small bowel givens  12/01/2010  . Colonoscopy  09/07/2010  . Esophagogastroduodenoscopy  09/05/2010    OUTLAW  . Lung surgery  6/11    [place]  . Esophagogastroduodenoscopy  03/26/2011    Procedure: ESOPHAGOGASTRODUODENOSCOPY (EGD);  Surgeon: Malissa Hippo, MD;  [facility];  Service: Endoscopy;  Laterality: N/A;  8:30 am  . Hot hemostasis  03/26/2011    Procedure: HOT HEMOSTASIS (ARGON PLASMA COAGULATION/BICAP);  Surgeon: Malissa Hippo, MD;  [facility];  Service: Endoscopy;  Laterality: N/A;  . Stent in liver    . Esophagogastroduodenoscopy N/A 10/24/2012    Procedure: ESOPHAGOGASTRODUODENOSCOPY (EGD);  Surgeon: Malissa Hippo, MD;  [facility];  Service: Endoscopy;  Laterality: N/A;  . Esophagogastroduodenoscopy N/A 11/22/2012    Procedure: ESOPHAGOGASTRODUODENOSCOPY (EGD);  Surgeon: Charna Elizabeth, MD;  Location: [facility] ENDOSCOPY;  Service: Endoscopy;  Laterality: N/A;  . Paracentesis    . Esophagogastroduodenoscopy N/A 12/21/2012    Procedure: ESOPHAGOGASTRODUODENOSCOPY (EGD);  Surgeon: Malissa Hippo, MD;  [facility];  Service: Endoscopy;  Laterality: N/A;  250-moved to 155 Ann to notify pt  . Hot hemostasis N/A 12/21/2012    Procedure: HOT HEMOSTASIS (ARGON PLASMA COAGULATION/BICAP);  Surgeon: Malissa Hippo, MD;  [facility];  Service: Endoscopy;  Laterality: N/A;  . Esophagogastroduodenoscopy N/A 01/25/2013  Procedure: ESOPHAGOGASTRODUODENOSCOPY (EGD);  Surgeon: Malissa Hippo, MD;  [facility];  Service: Endoscopy;  Laterality: N/A;  100  . Hot hemostasis N/A 01/25/2013    Procedure: HOT HEMOSTASIS (ARGON PLASMA COAGULATION/BICAP);  Surgeon: Malissa Hippo, MD;  [facility];  Service: Endoscopy;  Laterality: N/A;  . Esophagogastroduodenoscopy N/A 02/16/2013    Procedure: ESOPHAGOGASTRODUODENOSCOPY (EGD);  Surgeon:  Malissa Hippo, MD;  [facility];  Service: Endoscopy;  Laterality: N/A;   No family history on file. History  Substance Use Topics  . Smoking status: Former Smoker -- 1.00 packs/day for 30 years    Types: Cigarettes    Quit date: 08/11/2011  . Smokeless tobacco: Former Neurosurgeon    Quit date: 05/12/2012  . Alcohol Use: No     Comment: denies-quit 2000    Review of Systems  Constitutional: Negative for appetite change and fatigue.  HENT: Negative for congestion, sinus pressure and ear discharge.   Eyes: Negative for discharge.  Respiratory: Negative for cough.   Cardiovascular: Negative for chest pain.  Gastrointestinal: Positive for abdominal pain, blood in stool and hematochezia. Negative for diarrhea.  Genitourinary: Negative for frequency and hematuria.  Musculoskeletal: Negative for back pain.  Skin: Negative for rash.  Neurological: Negative for seizures and headaches.  Psychiatric/Behavioral: Negative for hallucinations.    Allergies  Tylenol  Home Medications   Current Outpatient Rx  Name  Route  Sig  Dispense  Refill  . b complex-vitamin c-folic acid (NEPHRO-VITE) 0.8 MG TABS   Oral   Take 1 tablet by mouth at bedtime.   30 tablet   0   . ciprofloxacin (CIPRO) 500 MG tablet   Oral   Take 1 tablet (500 mg total) by mouth daily.   30 tablet   3     Daivik needs to be taking this medication daily   . esomeprazole (NEXIUM) 40 MG capsule   Oral   Take 1 capsule (40 mg total) by mouth daily before breakfast.   30 capsule   5   . hydrocortisone valerate ointment (WESTCORT) 0.2 %   Topical   Apply 1 application topically every Monday, Wednesday, and Friday. For numbing prior to dialysis         . lactulose (CHRONULAC) 10 GM/15ML solution   Oral   Take 20 g by mouth daily as needed (for constipation).         . ondansetron (ZOFRAN) 4 MG tablet   Oral   Take 4 mg by mouth daily as needed for nausea.          . pantoprazole (PROTONIX) 40 MG  tablet   Oral   Take 1 tablet (40 mg total) by mouth 2 (two) times daily before a meal.   120 tablet   2   . sevelamer carbonate (RENVELA) 800 MG tablet   Oral   Take 2 tablets (1,600 mg total) by mouth 3 (three) times daily with meals.   180 tablet   2   . sucralfate (CARAFATE) 1 GM/10ML suspension   Oral   Take 10 mLs (1 g total) by mouth 4 (four) times daily.   420 mL   0   . traMADol (ULTRAM) 50 MG tablet   Oral   Take 50 mg by mouth 3 (three) times daily as needed for pain.           BP 124/53  Pulse 113  Temp(Src) 97.9 F (36.6 C) (Oral)  Resp 18  Ht 6\' 3"  (1.905 m)  Wt 206 lb (93.441 kg)  BMI 25.75 kg/m2  SpO2 100% Physical Exam  Nursing note and vitals reviewed. Constitutional: He is oriented to person, place, and time. He appears well-developed.  HENT:  Head: Normocephalic.  Eyes: Conjunctivae and EOM are normal. No scleral icterus.  Neck: Neck supple. No thyromegaly present.  Cardiovascular: Normal rate and regular rhythm.  Exam reveals no gallop and no friction rub.   No murmur heard. Pulmonary/Chest: No stridor. He has no wheezes. He has no rales. He exhibits no tenderness.  Abdominal: He exhibits distension. There is tenderness. There is no rebound.  Moderate tenderness through abdomen. Distended.  Genitourinary:  Dark stool that is heme positive.  Musculoskeletal: Normal range of motion. He exhibits no edema.  Lymphadenopathy:    He has no cervical adenopathy.  Neurological: He is oriented to person, place, and time. Coordination normal.  Skin: No rash noted. No erythema.  Psychiatric: He has a normal mood and affect. His behavior is normal.    ED Course  Procedures (including critical care time)  DIAGNOSTIC STUDIES: Oxygen Saturation is 100% on room air, normal by my interpretation.    COORDINATION OF CARE:  17:23- Discussed planned course of treatment in the ED with the patient, including Protonix, CBC, and BMP, who is agreeable at this  time.  19:25- Discussed lab work results with the pt and discussed admission. He is agreeable at this time.   Results for orders placed during the hospital encounter of 03/05/13  CBC WITH DIFFERENTIAL      Result Value Range   WBC 8.9  4.0 - 10.5 K/uL   RBC 2.21 (*) 4.22 - 5.81 MIL/uL   Hemoglobin 6.9 (*) 13.0 - 17.0 g/dL   HCT 16.1 (*) 09.6 - 04.5 %   MCV 96.4  78.0 - 100.0 fL   MCH 31.2  26.0 - 34.0 pg   MCHC 32.4  30.0 - 36.0 g/dL   RDW 40.9 (*) 81.1 - 91.4 %   Platelets 181  150 - 400 K/uL   Neutrophils Relative % 55  43 - 77 %   Lymphocytes Relative 23  12 - 46 %   Monocytes Relative 17 (*) 3 - 12 %   Eosinophils Relative 5  0 - 5 %   Basophils Relative 0  0 - 1 %   Neutro Abs 5.0  1.7 - 7.7 K/uL   Lymphs Abs 2.0  0.7 - 4.0 K/uL   Monocytes Absolute 1.5 (*) 0.1 - 1.0 K/uL   Eosinophils Absolute 0.4  0.0 - 0.7 K/uL   Basophils Absolute 0.0  0.0 - 0.1 K/uL   RBC Morphology POLYCHROMASIA PRESENT    BASIC METABOLIC PANEL      Result Value Range   Sodium 129 (*) 135 - 145 mEq/L   Potassium 3.7  3.5 - 5.1 mEq/L   Chloride 93 (*) 96 - 112 mEq/L   CO2 26  19 - 32 mEq/L   Glucose, Bld 240 (*) 70 - 99 mg/dL   BUN 34 (*) 6 - 23 mg/dL   Creatinine, Ser 7.82 (*) 0.50 - 1.35 mg/dL   Calcium 8.6  8.4 - 95.6 mg/dL   GFR calc non Af Amer 9 (*) >90 mL/min   GFR calc Af Amer 11 (*) >90 mL/min  PREPARE RBC (CROSSMATCH)      Result Value Range   Order Confirmation ORDER PROCESSED BY BLOOD BANK    TYPE AND SCREEN  Result Value Range   ABO/RH(D) B NEG     Antibody Screen PENDING     Sample Expiration 03/08/2013     Labs Review Labs Reviewed - No data to display Imaging Review No results found. CRITICAL CARE Performed by: Landen Breeland L Total critical care time: 35 Critical care time was exclusive of separately billable procedures and treating other patients. Critical care was necessary to treat or prevent imminent or life-threatening deterioration. Critical care was time  spent personally by me on the following activities: development of treatment plan with patient and/or surrogate as well as nursing, discussions with consultants, evaluation of patient's response to treatment, examination of patient, obtaining history from patient or surrogate, ordering and performing treatments and interventions, ordering and review of laboratory studies, ordering and review of radiographic studies, pulse oximetry and re-evaluation of patient's condition.   MDM  No diagnosis found.  The chart was scribed for me under my direct supervision.  I personally performed the history, physical, and medical decision making and all procedures in the evaluation of this patient.[name], MD 03/05/13 [phone]

## 2013-03-05 NOTE — Progress Notes (Signed)
Patient ID: Timothy Chan, male   DOB: 17-Nov-1950, 62 y.o.   MRN: [id number] Facility [facility] Chief complaint; admission to SNF to stay at [facility] August 15 1 through 02/27/2013 History; this is a 62 year old man who is severely chronically ill secondary to advanced cirrhosis of the liver which in turn is listed as being secondary to alcohol. He has had a remote TIPS procedure. He was admitted with hematemesis and melena earlier in this month and discharged on August 11 of. EGD showed no evidence of gastric paresis. 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 This occasion he continued to have 3 to 5 black probably BMs daily associated with generalized weakness. He was not able to get off his couch her bed or perform any activities. He had multiple falls at home. His hemoglobin and on from 8.5 on August 11 to 7.5 on the day of this admit. On admission they were not sure whether he had been losing upper GI bleed vs. all as melena is from retained GI bleeding from a previous GI bleed. As his ascites he underwent a paracentesis years of fluid the. I don't see the cell count the culture and Gram stain were negative Lab Results  Component Value Date   WBC 7.6 02/27/2013   HGB 8.1* 02/27/2013   HCT 24.2* 02/27/2013   MCV 95.3 02/27/2013   PLT 125* 02/27/2013   He has had multiple endoscopies the last one I see is that in May of this year by Dr. Loreta Ave. He is supposed to have had a CBC today although I don't see this.  Past Medical History  Diagnosis Date  . Rectal bleeding   . Dialysis care   . GI bleed   . Hypertension   . Esophageal varices   . Alcoholic liver disease   . Hepatic encephalopathy   . Ascites   . Diabetes mellitus   . Detached retina   . Renal disorder   . Pneumonia    Past Surgical History  Procedure Laterality Date  . Esophagogastroduodenoscopy  12/31/2010    EGD [facility] THERAPY  . Esophagogastroduodenoscopy   05/31/2012E    GD [facility] ABLATION  . Small bowel givens  12/01/2010  . Colonoscopy  09/07/2010  . Esophagogastroduodenoscopy  09/05/2010    OUTLAW  . Lung surgery  6/11    [place]  . Esophagogastroduodenoscopy  03/26/2011    Procedure: ESOPHAGOGASTRODUODENOSCOPY (EGD);  Surgeon: Malissa Hippo, MD;  [facility];  Service: Endoscopy;  Laterality: N/A;  8:30 am  . Hot hemostasis  03/26/2011    Procedure: HOT HEMOSTASIS (ARGON PLASMA COAGULATION/BICAP);  Surgeon: Malissa Hippo, MD;  [facility];  Service: Endoscopy;  Laterality: N/A;  . Stent in liver    . Esophagogastroduodenoscopy N/A 10/24/2012    Procedure: ESOPHAGOGASTRODUODENOSCOPY (EGD);  Surgeon: Malissa Hippo, MD;  [facility];  Service: Endoscopy;  Laterality: N/A;  . Esophagogastroduodenoscopy N/A 11/22/2012    Procedure: ESOPHAGOGASTRODUODENOSCOPY (EGD);  Surgeon: Charna Elizabeth, MD;  Location: [facility] ENDOSCOPY;  Service: Endoscopy;  Laterality: N/A;  . Paracentesis    . Esophagogastroduodenoscopy N/A 12/21/2012    Procedure: ESOPHAGOGASTRODUODENOSCOPY (EGD);  Surgeon: Malissa Hippo, MD;  [facility];  Service: Endoscopy;  Laterality: N/A;  250-moved to 155 Ann to notify pt  . Hot hemostasis N/A 12/21/2012    Procedure: HOT HEMOSTASIS (ARGON PLASMA COAGULATION/BICAP);  Surgeon: Malissa Hippo, MD;  [facility];  Service: Endoscopy;  Laterality: N/A;  . Esophagogastroduodenoscopy N/A 01/25/2013    Procedure: ESOPHAGOGASTRODUODENOSCOPY (EGD);  Surgeon: Malissa Hippo, MD;  [facility];  Service: Endoscopy;  Laterality: N/A;  100  . Hot hemostasis N/A 01/25/2013    Procedure: HOT HEMOSTASIS (ARGON PLASMA COAGULATION/BICAP);  Surgeon: Malissa Hippo, MD;  [facility];  Service: Endoscopy;  Laterality: N/A;  . Esophagogastroduodenoscopy N/A 02/16/2013    Procedure: ESOPHAGOGASTRODUODENOSCOPY (EGD);  Surgeon: Malissa Hippo, MD;  [facility];   Service: Endoscopy;  Laterality: N/A;   Medications; Cipro 500 daily [question SBE prophylaxis], lactulose 30 mg by mouth daily when necessary, protonic 40 twice a day, renuela 800 mg 2 tabs by mouth 3 times a day with meals, Carafate 10 mils by mouth 4 times a day, Ultram 50 mg 3 times a day when necessary folic acid 0.8 mg at at bedtime., His Lasix was stopped in the hospital as well as Megace    Social history; lives in [place]. I am not sure about the support he has in his own home   Review of systems Respiratory occasional cough nonproductive no shortness of breath Cardiac no chest pain Abdomen still complains about distention Extremities complaining about significant lower extremity edema Neurologic states he can barely lift his left leg off the bed it does not appear that he is actually been out of bed  General; No acute distress, chronically ill-looking man  Vitals; pulse 68 respirations 14 HEENT; no oral lesions Respiratory; bronchial breathing bilaterally at the bases which I suspect is pleural fluid. No wheezing work of breathing is normal Cardiac; S1normal,S2normal,no murmers, no gallops,no bruits. JVP is not elevated GI; no liver, no spleen,no masses. There is significant abdominal tenderness diffusely however bowel sounds are positive. Distended probable ascites. No asterixis. No obvious scleral icterus or jaundice GU; No bladder distention, no CVA tenderness MSK; significant tenderness in his left knee Skin; no lesions, no pressure areas Neurologic: CN's 2-12 normal, Motor; no weakness, Reflexes 2+ in the upper extremities absent at the knee jerks. Plantar responses flexor. He has significant bilateral lower extremity weakness Gait; not tested Mental Status; No overt Cognitive Difficulties, No depression.   Impression/plan #1 upper GI bleeding with melena. He Has Had Multiple Endoscopies Showing GAVE syndrome. He is on PPIs. He still describes small frequent block tarry  bowel movements. This is precisely what he described to the hospital as on admission. I'm not really completely sure what was thought not to my completely sure his hemoglobin is stable. I will need to check this in the facility. He is not on iron #2 alcohol-induced cirrhosis; he has ascites, portal hypertension. He has not had encephalopathy at the moment.  #3 diffuse abdominal tenderness. Culture of the ascitic fluid was recently done, he does not have spontaneous bacterial peritonitis. He does not look acutely ill #4 end-stage renal disease on dialysis. He was started on Lasix 80 mg twice a day on August 22 by our service. I am not decisively sure they knew he was on dialysis. #5 profound lower extremity weakness, not certain if this is all a peripheral neuropathy or just extensive disuse weakness.   I am really at a loss to know how to help this unfortunate man. I will monitor him for lower GI bleeding. If this continues alcohol GI and see what her options are for further evaluation. Is possible that this is small bowel bleeding etc

## 2013-03-05 NOTE — Progress Notes (Signed)
Late Entry: 2130 Patient to ICU #10 via stretcher. Patient is receiving 1st unit of blood at 125 mL/hr. -via a 22G in the RAC Patient is alert and in no apparent distress. -patient does not C/O pain at this time Patient connected to the monitors (cardiac, BP, and SaO2). -CHG completed  Patient declined being placed in a gown and having his shorts removed. -red but blanchable area not to the patient's (R) buttock the size of a grapefruit

## 2013-03-05 NOTE — ED Notes (Signed)
Attempted IV to rt [facility] without success

## 2013-03-05 NOTE — H&P (Signed)
[facility] History and Physical  DONNA SILVERMAN ZOX:[id number] DOB: 02-Sep-1950 DOA: 03/05/2013  Referring physician: Dr Estell Harpin PCP: Ignatius Specking., MD  Specialists: Dr Karilyn Cota, gastroenterology.  Chief Complaint: Melena.  HPI: Timothy Chan is a 62 y.o. male who is well-known to the hospital, having had 2 admissions so far this month alone, this being his third admission. He was previously admitted 10 days ago for black tarry stools and anemia requiring blood transfusion. He comes again now with melena, he was found to have a hemoglobin of 6.9. He also has had abdominal pain and discomfort and missed dialysis yesterday. He has end-stage renal disease and is on hemodialysis 3 times a week. On his previous admission, no EGD was done as he had recently had one. The patient has a history of alcoholic liver disease with esophageal varices and recurrent ascites. On the last admission the patient had paracentesis of 2 L or so. The patient has had a TIPS procedure previously and the plan was to follow with Dr. Karilyn Cota, gastroenterology to see if he would benefit from another one. He lives at a skilled nursing facility and he was evaluated by Dr. Leanord Hawking, physician at the nursing home today. He has become weaker again. He has had frequent falls and is severely deconditioned. He is now being admitted for further management.   Review of Systems:  Apart from history of present illness, other systems negative.  Past Medical History  Diagnosis Date  . Rectal bleeding   . Dialysis care   . GI bleed   . Hypertension   . Esophageal varices   . Alcoholic liver disease   . Hepatic encephalopathy   . Ascites   . Diabetes mellitus   . Detached retina   . Renal disorder   . Pneumonia    Past Surgical History  Procedure Laterality Date  . Esophagogastroduodenoscopy  12/31/2010    EGD [facility] THERAPY  . Esophagogastroduodenoscopy  05/31/2012E    GD [facility] ABLATION  . Small bowel givens  12/01/2010  .  Colonoscopy  09/07/2010  . Esophagogastroduodenoscopy  09/05/2010    OUTLAW  . Lung surgery  6/11    [place]  . Esophagogastroduodenoscopy  03/26/2011    Procedure: ESOPHAGOGASTRODUODENOSCOPY (EGD);  Surgeon: Malissa Hippo, MD;  [facility];  Service: Endoscopy;  Laterality: N/A;  8:30 am  . Hot hemostasis  03/26/2011    Procedure: HOT HEMOSTASIS (ARGON PLASMA COAGULATION/BICAP);  Surgeon: Malissa Hippo, MD;  [facility];  Service: Endoscopy;  Laterality: N/A;  . Stent in liver    . Esophagogastroduodenoscopy N/A 10/24/2012    Procedure: ESOPHAGOGASTRODUODENOSCOPY (EGD);  Surgeon: Malissa Hippo, MD;  [facility];  Service: Endoscopy;  Laterality: N/A;  . Esophagogastroduodenoscopy N/A 11/22/2012    Procedure: ESOPHAGOGASTRODUODENOSCOPY (EGD);  Surgeon: Charna Elizabeth, MD;  Location: [facility] ENDOSCOPY;  Service: Endoscopy;  Laterality: N/A;  . Paracentesis    . Esophagogastroduodenoscopy N/A 12/21/2012    Procedure: ESOPHAGOGASTRODUODENOSCOPY (EGD);  Surgeon: Malissa Hippo, MD;  [facility];  Service: Endoscopy;  Laterality: N/A;  250-moved to 155 Ann to notify pt  . Hot hemostasis N/A 12/21/2012    Procedure: HOT HEMOSTASIS (ARGON PLASMA COAGULATION/BICAP);  Surgeon: Malissa Hippo, MD;  [facility];  Service: Endoscopy;  Laterality: N/A;  . Esophagogastroduodenoscopy N/A 01/25/2013    Procedure: ESOPHAGOGASTRODUODENOSCOPY (EGD);  Surgeon: Malissa Hippo, MD;  [facility];  Service: Endoscopy;  Laterality: N/A;  100  .  Hot hemostasis N/A 01/25/2013    Procedure: HOT HEMOSTASIS (ARGON PLASMA COAGULATION/BICAP);  Surgeon: Malissa Hippo, MD;  [facility];  Service: Endoscopy;  Laterality: N/A;  . Esophagogastroduodenoscopy N/A 02/16/2013    Procedure: ESOPHAGOGASTRODUODENOSCOPY (EGD);  Surgeon: Malissa Hippo, MD;  [facility];  Service: Endoscopy;  Laterality: N/A;   Social History:  reports  that he quit smoking about 18 months ago. His smoking use included Cigarettes. He has a 30 pack-year smoking history. He quit smokeless tobacco use about 9 months ago. He reports that he does not drink alcohol or use illicit drugs.   Allergies  Allergen Reactions  . Tylenol [Acetaminophen] Other (See Comments)    cirrhosis    No family history on file. noncontributory.   Prior to Admission medications   Medication Sig Start Date End Date Taking? Authorizing Provider  ciprofloxacin (CIPRO) 500 MG tablet Take 1 tablet (500 mg total) by mouth daily. 02/27/13  Yes Richarda Overlie, MD  Folic Acid 0.8 MG CAPS Take 1 capsule by mouth at bedtime.   Yes Historical Provider, MD  furosemide (LASIX) 80 MG tablet Take 80 mg by mouth 2 (two) times daily.   Yes Historical Provider, MD  hydrocortisone valerate ointment (WESTCORT) 0.2 % Apply 1 application topically every Monday, Wednesday, and Friday. For numbing prior to dialysis   Yes Historical Provider, MD  lactulose (CHRONULAC) 10 GM/15ML solution Take 20 g by mouth daily as needed (for constipation). 11/25/12  Yes Christiane Ha, MD  ondansetron (ZOFRAN) 4 MG tablet Take 4 mg by mouth daily as needed for nausea.    Yes Historical Provider, MD  pantoprazole (PROTONIX) 40 MG tablet Take 1 tablet (40 mg total) by mouth 2 (two) times daily before a meal. 02/27/13  Yes Richarda Overlie, MD  sevelamer carbonate (RENVELA) 800 MG tablet Take 2 tablets (1,600 mg total) by mouth 3 (three) times daily with meals. 02/27/13  Yes Richarda Overlie, MD  sodium phosphate (FLEET) enema Place 1 enema rectally once. follow package directions   Yes Historical Provider, MD  sucralfate (CARAFATE) 1 GM/10ML suspension Take 10 mLs (1 g total) by mouth 4 (four) times daily. 01/15/13  Yes Flint Melter, MD  traMADol (ULTRAM) 50 MG tablet Take 50 mg by mouth 3 (three) times daily as needed for pain.    Yes Historical Provider, MD   Physical Exam: Filed Vitals:   03/05/13 1840  BP:  114/60  Pulse: 114  Temp:   Resp: 18     General:  He looks chronically sick and pale. He does not look toxic.  Eyes: Pallor. No jaundice.  ENT: Unremarkable.  Neck: No lymphadenopathy.  Cardiovascular: Heart sounds are present without gallop rhythm. He is not in congestive heart failure clinically.  Respiratory: Lung fields are clear.  Abdomen: Distended, tender in a generalized fashion but bowel sounds are heard. Difficult to palpate for any masses. He does appear to have clinical ascites again.  Skin: No rash.  Musculoskeletal: No acute joint abnormalities.  Psychiatric: Appropriate affect.  Neurologic: Alert and oriented. No focal neurological signs. No evidence of hepatic encephalopathy clinically.  Labs on Admission:  Basic Metabolic Panel:  Recent Labs Lab 02/27/13 0455 03/05/13 1735  NA 139 129*  K 4.1 3.7  CL 102 93*  CO2 27 26  GLUCOSE 131* 240*  BUN 24* 34*  CREATININE 3.79* 5.97*  CALCIUM 8.0* 8.6  PHOS 3.4  --        CBC:  Recent Labs Lab 02/27/13 0455 03/05/13 1735  WBC 7.6 8.9  NEUTROABS  --  5.0  HGB 8.1* 6.9*  HCT 24.2* 21.3*  MCV 95.3 96.4  PLT 125* 181      BNP (last 3 results)  Recent Labs  11/21/12 1145  PROBNP 1167.0*   CBG:  Recent Labs Lab 02/26/13 2113 02/27/13 0040 02/27/13 0338 02/27/13 0736 02/27/13 1141  GLUCAP 99 119* 131* 130* 198*    Radiological Exams on Admission: No results found.    Assessment/Plan   1. Upper GI bleed with history of melena and anemia. 2. Acute blood loss anemia secondary to #1. 3. End-stage renal disease on hemodialysis 3 times a week. 4. Type 2 diabetes mellitus. 5. Alcoholic liver cirrhosis with decompensation. 6. Hypertension. 7. Frequent falls and deconditioning secondary to above medical problems.  Plan: 1. Admit to step down unit. 2. Give 2 units of blood. Monitor hemoglobin. 3. Intravenous Protonix. 4. Gastroenterology consultation. 5. Consider  ultrasound-guided paracentesis again. 6. Nephrology consultation for hemodialysis. Further recommendations will depend on patient's hospital progress. Unfortunately, this man seems to not stabilize sufficiently to have any quality of life. I do not think he realizes the gravity of his medical problems, despite several conversations with him in the past and even this evening.  Code Status: Full code per patient's wishes. He did state to me this evening that if he has to be on a ventilator and does not improve after 7 days, he agrees for discontinuation of ventilator. For the time being, however he is a full code.  Family Communication: Discussed plan with patient at the bedside.   Disposition Plan: Back to the skilled nursing facility when medically stable.   Time spent: 60 minutes.  Wilson Singer [facility] Pager [phone].  If 7PM-7AM, please contact night-coverage [email or web address] Password [facility] 03/05/2013, 8:25 PM

## 2013-03-06 ENCOUNTER — Inpatient Hospital Stay (HOSPITAL_COMMUNITY): Payer: Medicare Other

## 2013-03-06 DIAGNOSIS — D62 Acute posthemorrhagic anemia: Secondary | ICD-10-CM

## 2013-03-06 DIAGNOSIS — N186 End stage renal disease: Secondary | ICD-10-CM

## 2013-03-06 DIAGNOSIS — K31819 Angiodysplasia of stomach and duodenum without bleeding: Secondary | ICD-10-CM

## 2013-03-06 DIAGNOSIS — K766 Portal hypertension: Secondary | ICD-10-CM

## 2013-03-06 DIAGNOSIS — D696 Thrombocytopenia, unspecified: Secondary | ICD-10-CM

## 2013-03-06 DIAGNOSIS — K922 Gastrointestinal hemorrhage, unspecified: Secondary | ICD-10-CM

## 2013-03-06 DIAGNOSIS — K7030 Alcoholic cirrhosis of liver without ascites: Secondary | ICD-10-CM

## 2013-03-06 DIAGNOSIS — K31811 Angiodysplasia of stomach and duodenum with bleeding: Secondary | ICD-10-CM

## 2013-03-06 LAB — COMPREHENSIVE METABOLIC PANEL
ALT: 14 U/L (ref 0–53)
AST: 28 U/L (ref 0–37)
Albumin: 1.3 g/dL — ABNORMAL LOW (ref 3.5–5.2)
CO2: 27 mEq/L (ref 19–32)
Chloride: 94 mEq/L — ABNORMAL LOW (ref 96–112)
GFR calc non Af Amer: 8 mL/min — ABNORMAL LOW (ref >90)
Sodium: 130 mEq/L — ABNORMAL LOW (ref 135–145)
Total Bilirubin: 1.2 mg/dL (ref 0.3–1.2)

## 2013-03-06 LAB — CBC
Hemoglobin: 9.1 g/dL — ABNORMAL LOW (ref 13.0–17.0)
MCH: 31 pg (ref 26.0–34.0)
MCHC: 33.1 g/dL (ref 30.0–36.0)
MCV: 93.5 fL (ref 78.0–100.0)
Platelets: 170 10*3/uL (ref 150–400)

## 2013-03-06 LAB — PROTIME-INR: Prothrombin Time: 15.5 seconds — ABNORMAL HIGH (ref 11.6–15.2)

## 2013-03-06 MED ORDER — INSULIN ASPART 100 UNIT/ML ~~LOC~~ SOLN
0.0000 [IU] | Freq: Four times a day (QID) | SUBCUTANEOUS | Status: DC
  Administered 2013-03-07 – 2013-03-08 (×3): 1 [IU] via SUBCUTANEOUS
  Administered 2013-03-10: 2 [IU] via SUBCUTANEOUS
  Administered 2013-03-11: 1 [IU] via SUBCUTANEOUS
  Administered 2013-03-11: 2 [IU] via SUBCUTANEOUS
  Administered 2013-03-11 (×2): 1 [IU] via SUBCUTANEOUS
  Administered 2013-03-12 (×2): 2 [IU] via SUBCUTANEOUS
  Administered 2013-03-12 – 2013-03-13 (×2): 1 [IU] via SUBCUTANEOUS
  Administered 2013-03-14 – 2013-03-15 (×2): 2 [IU] via SUBCUTANEOUS
  Administered 2013-03-15: 1 [IU] via SUBCUTANEOUS
  Administered 2013-03-15: 2 [IU] via SUBCUTANEOUS

## 2013-03-06 MED ORDER — TRAMADOL HCL 50 MG PO TABS
50.0000 mg | ORAL_TABLET | Freq: Two times a day (BID) | ORAL | Status: DC | PRN
  Administered 2013-03-08 – 2013-03-13 (×4): 50 mg via ORAL
  Filled 2013-03-06 (×6): qty 1

## 2013-03-06 MED ORDER — CIPROFLOXACIN HCL 250 MG PO TABS
500.0000 mg | ORAL_TABLET | Freq: Every day | ORAL | Status: DC
  Administered 2013-03-06 – 2013-03-16 (×11): 500 mg via ORAL
  Filled 2013-03-06 (×11): qty 2

## 2013-03-06 MED ORDER — SODIUM CHLORIDE 0.9 % IV SOLN: 100.0000 mL | INTRAVENOUS | Status: DC | PRN

## 2013-03-06 MED ORDER — ALTEPLASE 2 MG IJ SOLR: 2.0000 mg | Freq: Once | INTRAMUSCULAR | Status: AC | PRN

## 2013-03-06 MED ORDER — LIDOCAINE-PRILOCAINE 2.5-2.5 % EX CREA
1.0000 "application " | TOPICAL_CREAM | CUTANEOUS | Status: DC | PRN
  Administered 2013-03-06 – 2013-03-10 (×2): 1 via TOPICAL
  Filled 2013-03-06 (×3): qty 5

## 2013-03-06 MED ORDER — NEPRO/CARBSTEADY PO LIQD
237.0000 mL | ORAL | Status: DC | PRN
  Filled 2013-03-06: qty 237

## 2013-03-06 MED ORDER — BIOTENE DRY MOUTH MT LIQD
15.0000 mL | Freq: Two times a day (BID) | OROMUCOSAL | Status: DC
  Administered 2013-03-06 – 2013-03-16 (×13): 15 mL via OROMUCOSAL

## 2013-03-06 MED ORDER — PENTAFLUOROPROP-TETRAFLUOROETH EX AERO
1.0000 "application " | INHALATION_SPRAY | CUTANEOUS | Status: DC | PRN
  Filled 2013-03-06: qty 103.5

## 2013-03-06 MED ORDER — DIPHENHYDRAMINE HCL 50 MG/ML IJ SOLN
12.5000 mg | Freq: Three times a day (TID) | INTRAMUSCULAR | Status: DC | PRN
  Administered 2013-03-06 – 2013-03-13 (×6): 12.5 mg via INTRAVENOUS
  Filled 2013-03-06 (×6): qty 1

## 2013-03-06 MED ORDER — LIDOCAINE HCL (PF) 1 % IJ SOLN: 5.0000 mL | INTRAMUSCULAR | Status: DC | PRN

## 2013-03-06 MED ORDER — HEPARIN SODIUM (PORCINE) 1000 UNIT/ML DIALYSIS
1000.0000 [IU] | INTRAMUSCULAR | Status: DC | PRN
  Filled 2013-03-06: qty 1

## 2013-03-06 MED ORDER — CHLORHEXIDINE GLUCONATE 0.12 % MT SOLN
15.0000 mL | Freq: Two times a day (BID) | OROMUCOSAL | Status: DC
  Administered 2013-03-06 – 2013-03-09 (×8): 15 mL via OROMUCOSAL
  Filled 2013-03-06 (×8): qty 15

## 2013-03-06 NOTE — Plan of Care (Signed)
Problem: Phase I Progression Outcomes Goal: Pain controlled with appropriate interventions Outcome: Progressing Morphine 2mg  iv given for mid abdominal pain

## 2013-03-06 NOTE — Consult Note (Signed)
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 He is back with melena and drop in his H&H. Measurement is difficult in this situation given the patient's advanced liver and renal disease. Patient is agreeable to repeating an EGD with repeat CBC hoping to GI bleed and need for transfusion. Please refer to Ms. Raynelle Bring, NPs note for details.

## 2013-03-06 NOTE — Care Management Note (Signed)
    Page 1 of 1   03/16/2013     4:28:46 PM   CARE MANAGEMENT NOTE 03/16/2013  Patient:  Timothy Chan, Timothy Chan   Account Number:  [id number]  Date Initiated:  03/06/2013  Documentation initiated by:  Sharrie Rothman  Subjective/Objective Assessment:   Pt admitted from [facility] with melena and gi bleed. Pt will return to facility at discharge. Pt is also dialysis pt.     Action/Plan:   CSW to arrange discharge to facility when medically stable.   Anticipated DC Date:  03/16/2013   Anticipated DC Plan:  SKILLED NURSING FACILITY  In-house referral  Clinical Social Worker      DC Planning Services  CM consult      Choice offered to / List presented to:             Status of service:  Completed, signed off Medicare Important Message given?   (If response is "NO", the following Medicare IM given date fields will be blank) Date Medicare IM given:   Date Additional Medicare IM given:    Discharge Disposition:  SKILLED NURSING FACILITY  Per UR Regulation:  Reviewed for med. necessity/level of care/duration of stay  If discussed at Long Length of Stay Meetings, dates discussed:    Comments:  03/16/13 1630 Anibal Henderson RN/CM Pt being D/C to [facility] 03/13/13 1650 [facility] RN pt remains quite ill with HGB low at 8.2 and has received 10 units of blood. Drs Kerry Hough and Karilyn Cota are to have a doctor/patient/family conference concerning prognosis and Hospice this afternoon  03/09/13 1130 Arlyss Queen, RN BSN CM Pt still bleeding and requiring blood transfusion. Pt having EGD today. Pt will still return to [facility] at discharge. Will continue to follow.  03/06/13 1610 Arlyss Queen, RN BSN CM

## 2013-03-06 NOTE — Consult Note (Signed)
Reason for Consult: End-stage renal disease Referring Physician: Dr. Launa Grill Timothy Chan is an 62 y.o. male.  HPI: He patient was history of her end-stage renal disease, liver cirrhosis with ascites presently came with GI bleeding. Patient has recurrent problem with similar issue. Presently his hemoglobin hematocrit was found to be low patient received a transfusion his H&H has improved. He denies any difficulty breathing. He has some nausea and appetite is not that good. His last dialysis was on Friday and he didn't go yesterday.  Past Medical History  Diagnosis Date  . Rectal bleeding   . Dialysis care   . GI bleed   . Hypertension   . Esophageal varices   . Alcoholic liver disease   . Hepatic encephalopathy   . Ascites   . Diabetes mellitus   . Detached retina   . Renal disorder   . Pneumonia     Past Surgical History  Procedure Laterality Date  . Esophagogastroduodenoscopy  12/31/2010    EGD [facility] THERAPY  . Esophagogastroduodenoscopy  05/31/2012E    GD [facility] ABLATION  . Small bowel givens  12/01/2010  . Colonoscopy  09/07/2010  . Esophagogastroduodenoscopy  09/05/2010    OUTLAW  . Lung surgery  6/11    [place]  . Esophagogastroduodenoscopy  03/26/2011    Procedure: ESOPHAGOGASTRODUODENOSCOPY (EGD);  Surgeon: Malissa Hippo, MD;  [facility];  Service: Endoscopy;  Laterality: N/A;  8:30 am  . Hot hemostasis  03/26/2011    Procedure: HOT HEMOSTASIS (ARGON PLASMA COAGULATION/BICAP);  Surgeon: Malissa Hippo, MD;  [facility];  Service: Endoscopy;  Laterality: N/A;  . Stent in liver    . Esophagogastroduodenoscopy N/A 10/24/2012    Procedure: ESOPHAGOGASTRODUODENOSCOPY (EGD);  Surgeon: Malissa Hippo, MD;  [facility];  Service: Endoscopy;  Laterality: N/A;  . Esophagogastroduodenoscopy N/A 11/22/2012    Procedure: ESOPHAGOGASTRODUODENOSCOPY (EGD);  Surgeon: Charna Elizabeth, MD;  Location: [facility] ENDOSCOPY;  Service: Endoscopy;  Laterality:  N/A;  . Paracentesis    . Esophagogastroduodenoscopy N/A 12/21/2012    Procedure: ESOPHAGOGASTRODUODENOSCOPY (EGD);  Surgeon: Malissa Hippo, MD;  [facility];  Service: Endoscopy;  Laterality: N/A;  250-moved to 155 Ann to notify pt  . Hot hemostasis N/A 12/21/2012    Procedure: HOT HEMOSTASIS (ARGON PLASMA COAGULATION/BICAP);  Surgeon: Malissa Hippo, MD;  [facility];  Service: Endoscopy;  Laterality: N/A;  . Esophagogastroduodenoscopy N/A 01/25/2013    Procedure: ESOPHAGOGASTRODUODENOSCOPY (EGD);  Surgeon: Malissa Hippo, MD;  [facility];  Service: Endoscopy;  Laterality: N/A;  100  . Hot hemostasis N/A 01/25/2013    Procedure: HOT HEMOSTASIS (ARGON PLASMA COAGULATION/BICAP);  Surgeon: Malissa Hippo, MD;  [facility];  Service: Endoscopy;  Laterality: N/A;  . Esophagogastroduodenoscopy N/A 02/16/2013    Procedure: ESOPHAGOGASTRODUODENOSCOPY (EGD);  Surgeon: Malissa Hippo, MD;  [facility];  Service: Endoscopy;  Laterality: N/A;    No family history on file.  Social History:  reports that he quit smoking about 18 months ago. His smoking use included Cigarettes. He has a 30 pack-year smoking history. He quit smokeless tobacco use about 9 months ago. He reports that he does not drink alcohol or use illicit drugs.  Allergies:  Allergies  Allergen Reactions  . Tylenol [Acetaminophen] Other (See Comments)    cirrhosis    Medications: I have reviewed the patient's current medications.  Results for orders placed during the hospital encounter of 03/05/13 (from  the past 48 hour(s))  CBC WITH DIFFERENTIAL     Status: Abnormal   Collection Time    03/05/13  5:35 PM      Result Value Range   WBC 8.9  4.0 - 10.5 K/uL   RBC 2.21 (*) 4.22 - 5.81 MIL/uL   Hemoglobin 6.9 (*) 13.0 - 17.0 g/dL   Comment: RESULT REPEATED AND VERIFIED     CRITICAL RESULT CALLED TO, READ BACK BY AND VERIFIED WITH:     B. DANIELS AT 1802 ON 03/05/13 BY S.  VANHOORNE   HCT 21.3 (*) 39.0 - 52.0 %   MCV 96.4  78.0 - 100.0 fL   MCH 31.2  26.0 - 34.0 pg   MCHC 32.4  30.0 - 36.0 g/dL   RDW 47.8 (*) 29.5 - 62.1 %   Platelets 181  150 - 400 K/uL   Neutrophils Relative % 55  43 - 77 %   Lymphocytes Relative 23  12 - 46 %   Monocytes Relative 17 (*) 3 - 12 %   Eosinophils Relative 5  0 - 5 %   Basophils Relative 0  0 - 1 %   Neutro Abs 5.0  1.7 - 7.7 K/uL   Lymphs Abs 2.0  0.7 - 4.0 K/uL   Monocytes Absolute 1.5 (*) 0.1 - 1.0 K/uL   Eosinophils Absolute 0.4  0.0 - 0.7 K/uL   Basophils Absolute 0.0  0.0 - 0.1 K/uL   RBC Morphology POLYCHROMASIA PRESENT     Comment: SCHISTOCYTES PRESENT (2-5/hpf)  BASIC METABOLIC PANEL     Status: Abnormal   Collection Time    03/05/13  5:35 PM      Result Value Range   Sodium 129 (*) 135 - 145 mEq/L   Potassium 3.7  3.5 - 5.1 mEq/L   Chloride 93 (*) 96 - 112 mEq/L   CO2 26  19 - 32 mEq/L   Glucose, Bld 240 (*) 70 - 99 mg/dL   BUN 34 (*) 6 - 23 mg/dL   Creatinine, Ser 3.08 (*) 0.50 - 1.35 mg/dL   Calcium 8.6  8.4 - 65.7 mg/dL   GFR calc non Af Amer 9 (*) >90 mL/min   GFR calc Af Amer 11 (*) >90 mL/min   Comment: (NOTE)     The eGFR has been calculated using the CKD EPI equation.     This calculation has not been validated in all clinical situations.     eGFR's persistently <90 mL/min signify possible Chronic Kidney     Disease.  TYPE AND SCREEN     Status: None   Collection Time    03/05/13  5:35 PM      Result Value Range   ABO/RH(D) B NEG     Antibody Screen NEG     Sample Expiration 03/08/2013     Unit Number [id number]     Blood Component Type RBC LR PHER1     Unit division 00     Status of Unit ISSUED     Transfusion Status OK TO TRANSFUSE     Crossmatch Result Compatible     Unit Number [id number]     Blood Component Type RBC LR PHER1     Unit division 00     Status of Unit ISS'D ANOTH PT     Transfusion Status OK TO TRANSFUSE     Crossmatch Result Compatible     Unit Number  [id number]     Blood Component Type RED CELLS,LR  Unit division 00     Status of Unit ISSUED     Transfusion Status OK TO TRANSFUSE     Crossmatch Result Compatible     Unit Number [id number]     Blood Component Type RED CELLS,LR     Unit division 00     Status of Unit ISS'D ANOTH PT     Transfusion Status OK TO TRANSFUSE     Crossmatch Result Compatible     Unit Number [id number]     Blood Component Type RED CELLS,LR     Unit division 00     Status of Unit ALLOCATED     Transfusion Status OK TO TRANSFUSE     Crossmatch Result Compatible    PREPARE RBC (CROSSMATCH)     Status: None   Collection Time    03/05/13  7:00 PM      Result Value Range   Order Confirmation ORDER PROCESSED BY BLOOD BANK    COMPREHENSIVE METABOLIC PANEL     Status: Abnormal   Collection Time    03/06/13  5:51 AM      Result Value Range   Sodium 130 (*) 135 - 145 mEq/L   Potassium 3.7  3.5 - 5.1 mEq/L   Chloride 94 (*) 96 - 112 mEq/L   CO2 27  19 - 32 mEq/L   Glucose, Bld 190 (*) 70 - 99 mg/dL   BUN 35 (*) 6 - 23 mg/dL   Creatinine, Ser 3.24 (*) 0.50 - 1.35 mg/dL   Calcium 8.5  8.4 - 40.1 mg/dL   Total Protein 5.8 (*) 6.0 - 8.3 g/dL   Albumin 1.3 (*) 3.5 - 5.2 g/dL   AST 28  0 - 37 U/L   ALT 14  0 - 53 U/L   Alkaline Phosphatase 122 (*) 39 - 117 U/L   Total Bilirubin 1.2  0.3 - 1.2 mg/dL   GFR calc non Af Amer 8 (*) >90 mL/min   GFR calc Af Amer 10 (*) >90 mL/min   Comment: (NOTE)     The eGFR has been calculated using the CKD EPI equation.     This calculation has not been validated in all clinical situations.     eGFR's persistently <90 mL/min signify possible Chronic Kidney     Disease.  CBC     Status: Abnormal   Collection Time    03/06/13  5:51 AM      Result Value Range   WBC 8.3  4.0 - 10.5 K/uL   RBC 2.94 (*) 4.22 - 5.81 MIL/uL   Hemoglobin 9.1 (*) 13.0 - 17.0 g/dL   Comment: DELTA CHECK NOTED   HCT 27.5 (*) 39.0 - 52.0 %   MCV 93.5  78.0 - 100.0 fL   MCH 31.0  26.0  - 34.0 pg   MCHC 33.1  30.0 - 36.0 g/dL   RDW 02.7 (*) 25.3 - 66.4 %   Platelets 170  150 - 400 K/uL  PROTIME-INR     Status: Abnormal   Collection Time    03/06/13  5:51 AM      Result Value Range   Prothrombin Time 15.5 (*) 11.6 - 15.2 seconds   INR 1.26  0.00 - 1.49    No results found.  Review of Systems  Constitutional: Positive for weight loss.  Gastrointestinal: Positive for nausea and diarrhea. Negative for vomiting.  Neurological: Positive for weakness.   Blood pressure 114/58, pulse 103, temperature 97.6 F (36.4 C), temperature source  Axillary, resp. rate 29, height 6\' 3"  (1.905 m), weight 100.3 kg (221 lb 1.9 oz), SpO2 90.00%. Physical Exam  Eyes: No scleral icterus.  Neck: No JVD present.  Cardiovascular: Normal rate and regular rhythm.   Respiratory: He has no wheezes.  GI: He exhibits distension. There is no tenderness.  Musculoskeletal: He exhibits no edema.    Assessment/Plan: Problem #1 end-stage renal disease is status post hemodialysis on Friday. He spends 35 creatinine 6.38 and potassium is 3.7. Problem #2 history of GI bleeding. He status post blood transfusion his hemoglobin and 0.1 hematocrit 27.5. Problem #3 history of liver cirrhosis with ascites Problem #4 diabetes Problem #5 hepatic encephalopathy. Patient presently seems to be a left. Problem #6 history of her chronic pain syndrome. Presently abdomen and occasionally also his back. Problem #7 metabolic bone disease patient is on a binder  Plan: We'll make arrangements for patient to get dialysis today We'll check CBC and basic metabolic panel. Check his phosphorus in the morning. We'll DC his IV fluid and no fluid removal today.  Fendi Meinhardt S 03/06/2013, 7:56 AM

## 2013-03-06 NOTE — Progress Notes (Signed)
INITIAL NUTRITION ASSESSMENT  INTERVENTION: Follow diet advancement   NUTRITION DIAGNOSIS: Malnutrition; ongoing   Goal: Pt to meet >/= 90% of their estimated nutrition needs   Monitor:  Diet advancement, po intake, labs and changes in dry wt  Reason for Assessment: Malnutrition Screen Score = 2  62 y.o. male  Admitting Dx: Acute blood loss anemia  ASSESSMENT: Patient Active Problem List   Diagnosis Date Noted  . Malnutrition of moderate degree 02/16/2013  . Upper GI bleeding 02/14/2013  . ESRD on dialysis 02/14/2013  . Anorexia 01/18/2013  . 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 Thrombocytopenia, unspecified 10/31/2012  . SIRS (systemic inflammatory response syndrome) 10/26/2012  . Abdominal pain 10/26/2012  . Peritonitis associated with peritoneal dialysis 10/26/2012  . GI bleed 10/24/2012  . ESRD on peritoneal dialysis 10/20/2012  . Acute blood loss anemia 10/20/2012  . Hyperammonemia 10/20/2012  . Altered mental status 10/20/2012  . Alcoholic cirrhosis/remote TIPS 04/10/2012  . Hepatic encephalopathy 04/10/2012  . Diabetes mellitus type 2, uncontrolled, with complications 04/10/2012  . 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 CKD (chronic kidney disease) stage V requiring chronic dialysis 04/10/2012  . Peripheral neuropathy 04/10/2012   Pt from [facility] where he has been since discharged earlier this month.  Hx includes GI bleeding, ESRD with HD, Anorexia, Malnutrition (moderate).  He was assessed by RD on 02/15/13 wt then 206# . Current wt reflects significant gain of 16#(7kg), 8% x 18 days. Likely related to missing dialysis yesterday and ascites. His appetite is only fair.  He doesn't like the food at [facility] and reports ~ 50% of meals consumed. He was dialyzed today but net UF -94 ml. He also has paracentesis pending. NPO currently.  At risk for worsening malnutrition given his medical problems and continued poor po intake. Will follow diet  advancement and add oral supplements as appropriate.  Height: Ht Readings from Last 1 Encounters:  03/05/13 6\' 3"  (1.905 m)    Weight: Wt Readings from Last 1 Encounters:  03/06/13 222 lb 3.6 oz (100.8 kg)    Ideal Body Weight: 196# (89 kg)  % Ideal Body Weight: 113%  Wt Readings from Last 10 Encounters:  03/06/13 222 lb 3.6 oz (100.8 kg)  02/27/13 213 lb 9.6 oz (96.888 kg)  01/18/13 210 lb (95.255 kg)  01/15/13 220 lb (99.791 kg)  01/05/13 228 lb (103.42 kg)  12/19/12 230 lb (104.327 kg)  11/25/12 226 lb 6.6 oz (102.7 kg)  11/25/12 226 lb 6.6 oz (102.7 kg)  11/02/12 227 lb 8.2 oz (103.2 kg)  11/02/12 227 lb 8.2 oz (103.2 kg)    Usual Body Weight: 225-230#  % Usual Body Weight: 98%  BMI:  Body mass index is 27.78 kg/(m^2).skewed due to fluid status  Estimated Nutritional Needs: Kcal: 2400-2700  Protein: 110-125 gr  Fluid: 1000 ml plus urine output   Skin: No issues noted  Diet Order: NPO  EDUCATION NEEDS: -Education needs addressed   Intake/Output Summary (Last 24 hours) at 03/06/13 1131 Last data filed at 03/06/13 0800  Gross per 24 hour  Intake  762.5 ml  Output      0 ml  Net  762.5 ml  Oliguria  Last BM: 03/05/13 abdomen distended   Labs:   Recent Labs Lab 03/05/13 1735 03/06/13 0551  NA 129* 130*  K 3.7 3.7  CL 93* 94*  CO2 26 27  BUN 34* 35*  CREATININE 5.97* 6.38*  CALCIUM 8.6 8.5  GLUCOSE 240* 190*  CBG (last 3)  No results found for this basename: GLUCAP,  in the last 72 hours  Scheduled Meds: . antiseptic oral rinse  15 mL Mouth Rinse q12n4p  . chlorhexidine  15 mL Mouth Rinse BID  . ciprofloxacin  500 mg Oral Daily  . folic acid  1 mg Oral QHS  . furosemide  80 mg Oral BID  . [START ON 03/07/2013] hydrocortisone valerate ointment  1 application Topical Q M,W,F  . insulin aspart  0-9 Units Subcutaneous Q6H  . pantoprazole (PROTONIX) IV  40 mg Intravenous Q24H  . sevelamer carbonate  1,600 mg Oral TID WC  . sodium  chloride  3 mL Intravenous Q12H  . sucralfate  1 g Oral QID    Continuous Infusions:   Past Medical History  Diagnosis Date  . Rectal bleeding   . Dialysis care   . GI bleed   . Hypertension   . Esophageal varices   . Alcoholic liver disease   . Hepatic encephalopathy   . Ascites   . Diabetes mellitus   . Detached retina   . Renal disorder   . Pneumonia     Past Surgical History  Procedure Laterality Date  . Esophagogastroduodenoscopy  12/31/2010    EGD APC THERAPY  . Esophagogastroduodenoscopy  05/31/2012E    GD APC ABLATION  . Small bowel givens  12/01/2010  . Colonoscopy  09/07/2010  . Esophagogastroduodenoscopy  09/05/2010    OUTLAW  . Lung surgery  6/11    [place]  . Esophagogastroduodenoscopy  03/26/2011    Procedure: ESOPHAGOGASTRODUODENOSCOPY (EGD);  Surgeon: Malissa Hippo, MD;  [facility];  Service: Endoscopy;  Laterality: N/A;  8:30 am  . Hot hemostasis  03/26/2011    Procedure: HOT HEMOSTASIS (ARGON PLASMA COAGULATION/BICAP);  Surgeon: Malissa Hippo, MD;  [facility];  Service: Endoscopy;  Laterality: N/A;  . Stent in liver    . Esophagogastroduodenoscopy N/A 10/24/2012    Procedure: ESOPHAGOGASTRODUODENOSCOPY (EGD);  Surgeon: Malissa Hippo, MD;  [facility];  Service: Endoscopy;  Laterality: N/A;  . Esophagogastroduodenoscopy N/A 11/22/2012    Procedure: ESOPHAGOGASTRODUODENOSCOPY (EGD);  Surgeon: Charna Elizabeth, MD;  Location: [facility] ENDOSCOPY;  Service: Endoscopy;  Laterality: N/A;  . Paracentesis    . Esophagogastroduodenoscopy N/A 12/21/2012    Procedure: ESOPHAGOGASTRODUODENOSCOPY (EGD);  Surgeon: Malissa Hippo, MD;  [facility];  Service: Endoscopy;  Laterality: N/A;  250-moved to 155 Ann to notify pt  . Hot hemostasis N/A 12/21/2012    Procedure: HOT HEMOSTASIS (ARGON PLASMA COAGULATION/BICAP);  Surgeon: Malissa Hippo, MD;  [facility];  Service: Endoscopy;  Laterality: N/A;  .  Esophagogastroduodenoscopy N/A 01/25/2013    Procedure: ESOPHAGOGASTRODUODENOSCOPY (EGD);  Surgeon: Malissa Hippo, MD;  [facility];  Service: Endoscopy;  Laterality: N/A;  100  . Hot hemostasis N/A 01/25/2013    Procedure: HOT HEMOSTASIS (ARGON PLASMA COAGULATION/BICAP);  Surgeon: Malissa Hippo, MD;  [facility];  Service: Endoscopy;  Laterality: N/A;  . Esophagogastroduodenoscopy N/A 02/16/2013    Procedure: ESOPHAGOGASTRODUODENOSCOPY (EGD);  Surgeon: Malissa Hippo, MD;  [facility];  Service: Endoscopy;  Laterality: N/A;    Royann Shivers MS,RD,LDN,CSG Office: [phone] Pager: [phone]

## 2013-03-06 NOTE — Progress Notes (Signed)
[facility] PROGRESS NOTE  Timothy Chan ZOX:[id number] DOB: [birth year] DOA: 03/05/2013 PCP: Ignatius Specking., MD GI: Dr. Karilyn Cota.  Summary: 62 year old man with complex medical history including recurrent GI bleed, esophageal varices, GAVE and other complications from alcoholic cirrhosis who presented to the emergency department with history of hematochezia and profound anemia. He was admitted for upper GI bleed, acute blood loss anemia, alcoholic liver cirrhosis with decompensation, deconditioning.  Assessment/Plan: 1. Severe anemia, normocytic: Acute on chronic. Acute blood loss anemia secondary to GI bleed. Much improved status post 2 units packed red blood cells. No evidence of ongoing bleeding. This appears to be a subacute process. Repeat CBC in the morning. Followup GI consultation. 2. Abdominal pain: Significance unclear. Appears resolved. Exam benign. 3. 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 4. End-stage renal disease: Management per nephrology. 5. 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 History of TIPS procedure. Per chart review he has been evaluated at [facility] in the past and is not felt to be a candidate for transplant. 6. Diabetes mellitus: Capillary blood sugars stable. 7. Noncomplianc with hemodialysis, history of leaving AMA.   CBC in a.m. Further recommendations per gastroenterology.  Resume Cipro daily for SBP prophylaxis  Initiate sliding scale insulin  Could consider ultrasound-guided paracentesis although patient is asymptomatic at this point.  If hemoglobin remained stable, no further bleeding and no further recommendations per gastroenterology would anticipate discharge in 24 hours. If condition worsens would consider PICC placement.  Pending studies:   None  Code Status: Full code DVT prophylaxis: SCDs Family Communication: None present Disposition Plan: Likely return to [facility] when  improved  Brendia Sacks, MD  [facility]  Pager [phone] If 7PM-7AM, please contact night-coverage at [email or web address], password [facility] 03/06/2013, 7:54 AM  LOS: 1 day   Consultants:  GI  Nephrology   Procedures:  Routine HD  HPI/Subjective: He feels better today. Abdominal pain is nearly resolved. No shortness of breath. No other complaints. No nausea or vomiting.  Objective: Filed Vitals:   03/06/13 0400 03/06/13 0500 03/06/13 0600 03/06/13 0730  BP: 113/57 135/65 114/58   Pulse: 106 103    Temp: 98.1 F (36.7 C)   97.6 F (36.4 C)  TempSrc: Oral   Axillary  Resp: 17 17 29    Height:      Weight:      SpO2: 96% 90%      Intake/Output Summary (Last 24 hours) at 03/06/13 0754 Last data filed at 03/06/13 0600  Gross per 24 hour  Intake  612.5 ml  Output      0 ml  Net  612.5 ml     Filed Weights   03/05/13 1703 03/05/13 2145  Weight: 93.441 kg (206 lb) 100.3 kg (221 lb 1.9 oz)    Exam:   Afebrile, vital signs stable, mild tachycardia.  General: Appears calm, comfortable. Appears malnourished, chronically ill. Nontoxic.  Head appears grossly unremarkable. Eyes are grossly unremarkable.  Psychiatric: Grossly normal mood and affect. Speech fluent and appropriate.  Cardiovascular: Regular rate and rhythm. No murmur, rub, gallop. 3+ bilateral lower extremity edema.   Respiratory: Clear to auscultation bilaterally. No wheezes, rales, rhonchi. Normal respiratory effort.  Abdomen: Soft, distended, chronic skin changes noted. Nontender to palpation. Ascites appears to be present.  Musculoskeletal appears grossly normal  Data Reviewed:  Complete metabolic panel consistent with end-stage renal disease.  Hemoglobin improve 6.9 >> 9.1 status post 2 units packed red blood cells.  Scheduled Meds: .  folic acid  1 mg Oral QHS  . furosemide  80 mg Oral BID  . [START ON 03/07/2013] hydrocortisone valerate ointment  1 application Topical Q M,W,F  .  pantoprazole (PROTONIX) IV  40 mg Intravenous Q24H  . sevelamer carbonate  1,600 mg Oral TID WC  . sodium chloride  3 mL Intravenous Q12H  . sucralfate  1 g Oral QID   Continuous Infusions: . sodium chloride 75 mL/hr at 03/06/13 0600    Active Problems:   Alcoholic cirrhosis/remote TIPS   Acute blood loss anemia   Abdominal pain   GAVE (gastric antral vascular ectasia)   ESRD on dialysis   Time spent 20 minutes

## 2013-03-06 NOTE — Progress Notes (Signed)
Clinical Social Work Department BRIEF PSYCHOSOCIAL ASSESSMENT 03/06/2013  Patient:  Timothy Chan, Timothy Chan     Account Number:  [id number]     Admit date:  03/05/2013  Clinical Social Worker:  Robin Searing  Date/Time:  03/06/2013 02:08 PM  Referred by:  Physician  Date Referred:  03/06/2013 Referred for  SNF Placement   Other Referral:   Interview type:  Patient Other interview type:    PSYCHOSOCIAL DATA Living Status:  FACILITY Admitted from facility:  [facility] Level of care:  Skilled Nursing Facility Primary support name:  sons/POA Primary support relationship to patient:  FAMILY Degree of support available:   good    CURRENT CONCERNS Current Concerns  Post-Acute Placement   Other Concerns:    SOCIAL WORK ASSESSMENT / PLAN Patient reports he has been residing at [facility] for SNF care for about 2 weeks- he reports being in and out of the hospital a couple of times in the past month- Patient plans to return to SNF bed at [facility] at d/c and CSW has contacted SNF rep who is also prepared for his return.  Patient shared with CSW that he has not walked in about 4 years- he voiced frustration with this- CSW offered encouragement and support- he has been able to do some PT at SNF but it appears to have been minimal from what he shares.   Assessment/plan status:  Other - See comment Other assessment/ plan:   FL2 updated for SNF return   Information/referral to community resources:   SNF  EMS    PATIENT'S/FAMILY'S RESPONSE TO PLAN OF CARE: Patient pleasant and agreeable to plans to return to SNF at d/c- confirmed plans with SNF and CSW will follow along for support and further d/c planning       Reece Levy, MSW [phone]

## 2013-03-06 NOTE — Consult Note (Signed)
Reason for Consult: melena Referring Physician: Donney Rankins ist services  Timothy Chan is an 62 y.o. male.  HPI: Patient is a resident of [facility].  He is well known to our practice.  He tells me he has continued to have black stools. He c/o weakness. Hx of GAVE and underwent an EGD with APC therapy by Dr. Karilyn Cota on 02/16/2013.  EGD revealed: Impression:  Ulcerative esophagitis which is new since last EGD of 02/20/2013.  Portal gastropathy.  Gastric antral vascular ectasia. Multiple lesions are ablated with argon plasma coagulator.  Hx of alcoholic liver  and is a dialysis patient. Is to receive dialysis today.  He did not have dialysis yesterday as he was in the emergency dept at [facility].  Underwent a paracentesis 02/25/2013: IMPRESSION:  Ultrasound-guided paracentesis of 2000 ml of ascitic fluid.  Fluid sent to laboratory for requested analysis.  Cultures were negative for SBP. Admitted for further management.  Since admission he has received 2 units of PRBCs. Hemoglobin to 9.1. He denies abdominal pain.  There has been no nausea or vomiting.      Past Medical History  Diagnosis Date  . Rectal bleeding   . Dialysis care   . GI bleed   . Hypertension   . Esophageal varices   . Alcoholic liver disease   . Hepatic encephalopathy   . Ascites   . Diabetes mellitus   . Detached retina   . Renal disorder   . Pneumonia     Past Surgical History  Procedure Laterality Date  . Esophagogastroduodenoscopy  12/31/2010    EGD APC THERAPY  . Esophagogastroduodenoscopy  05/31/2012E    GD APC ABLATION  . Small bowel givens  12/01/2010  . Colonoscopy  09/07/2010  . Esophagogastroduodenoscopy  09/05/2010    OUTLAW  . Lung surgery  6/11    [place]  . Esophagogastroduodenoscopy  03/26/2011    Procedure: ESOPHAGOGASTRODUODENOSCOPY (EGD);  Surgeon: Malissa Hippo, MD;  [facility];  Service: Endoscopy;  Laterality: N/A;  8:30 am  . Hot  hemostasis  03/26/2011    Procedure: HOT HEMOSTASIS (ARGON PLASMA COAGULATION/BICAP);  Surgeon: Malissa Hippo, MD;  [facility];  Service: Endoscopy;  Laterality: N/A;  . Stent in liver    . Esophagogastroduodenoscopy N/A 10/24/2012    Procedure: ESOPHAGOGASTRODUODENOSCOPY (EGD);  Surgeon: Malissa Hippo, MD;  [facility];  Service: Endoscopy;  Laterality: N/A;  . Esophagogastroduodenoscopy N/A 11/22/2012    Procedure: ESOPHAGOGASTRODUODENOSCOPY (EGD);  Surgeon: Charna Elizabeth, MD;  Location: [facility] ENDOSCOPY;  Service: Endoscopy;  Laterality: N/A;  . Paracentesis    . Esophagogastroduodenoscopy N/A 12/21/2012    Procedure: ESOPHAGOGASTRODUODENOSCOPY (EGD);  Surgeon: Malissa Hippo, MD;  [facility];  Service: Endoscopy;  Laterality: N/A;  250-moved to 155 Ann to notify pt  . Hot hemostasis N/A 12/21/2012    Procedure: HOT HEMOSTASIS (ARGON PLASMA COAGULATION/BICAP);  Surgeon: Malissa Hippo, MD;  [facility];  Service: Endoscopy;  Laterality: N/A;  . Esophagogastroduodenoscopy N/A 01/25/2013    Procedure: ESOPHAGOGASTRODUODENOSCOPY (EGD);  Surgeon: Malissa Hippo, MD;  [facility];  Service: Endoscopy;  Laterality: N/A;  100  . Hot hemostasis N/A 01/25/2013    Procedure: HOT HEMOSTASIS (ARGON PLASMA COAGULATION/BICAP);  Surgeon: Malissa Hippo, MD;  [facility];  Service: Endoscopy;  Laterality: N/A;  . Esophagogastroduodenoscopy N/A 02/16/2013    Procedure: ESOPHAGOGASTRODUODENOSCOPY (EGD);  Surgeon: Malissa Hippo, MD;  [facility];  Service: Endoscopy;  Laterality: N/A;    No family history on file.  Social History:  reports that he quit smoking about 18 months ago. His smoking use included Cigarettes. He has a 30 pack-year smoking history. He quit smokeless tobacco use about 9 months ago. He reports that he does not drink alcohol or use illicit drugs.  Allergies:  Allergies  Allergen Reactions  . Tylenol  [Acetaminophen] Other (See Comments)    cirrhosis    Medications: I have reviewed the patient's current medications.  Results for orders placed during the hospital encounter of 03/05/13 (from the past 48 hour(s))  CBC WITH DIFFERENTIAL     Status: Abnormal   Collection Time    03/05/13  5:35 PM      Result Value Range   WBC 8.9  4.0 - 10.5 K/uL   RBC 2.21 (*) 4.22 - 5.81 MIL/uL   Hemoglobin 6.9 (*) 13.0 - 17.0 g/dL   Comment: RESULT REPEATED AND VERIFIED     CRITICAL RESULT CALLED TO, READ BACK BY AND VERIFIED WITH:     B. DANIELS AT 1802 ON 03/05/13 BY S. VANHOORNE   HCT 21.3 (*) 39.0 - 52.0 %   MCV 96.4  78.0 - 100.0 fL   MCH 31.2  26.0 - 34.0 pg   MCHC 32.4  30.0 - 36.0 g/dL   RDW 32.4 (*) 40.1 - 02.7 %   Platelets 181  150 - 400 K/uL   Neutrophils Relative % 55  43 - 77 %   Lymphocytes Relative 23  12 - 46 %   Monocytes Relative 17 (*) 3 - 12 %   Eosinophils Relative 5  0 - 5 %   Basophils Relative 0  0 - 1 %   Neutro Abs 5.0  1.7 - 7.7 K/uL   Lymphs Abs 2.0  0.7 - 4.0 K/uL   Monocytes Absolute 1.5 (*) 0.1 - 1.0 K/uL   Eosinophils Absolute 0.4  0.0 - 0.7 K/uL   Basophils Absolute 0.0  0.0 - 0.1 K/uL   RBC Morphology POLYCHROMASIA PRESENT     Comment: SCHISTOCYTES PRESENT (2-5/hpf)  BASIC METABOLIC PANEL     Status: Abnormal   Collection Time    03/05/13  5:35 PM      Result Value Range   Sodium 129 (*) 135 - 145 mEq/L   Potassium 3.7  3.5 - 5.1 mEq/L   Chloride 93 (*) 96 - 112 mEq/L   CO2 26  19 - 32 mEq/L   Glucose, Bld 240 (*) 70 - 99 mg/dL   BUN 34 (*) 6 - 23 mg/dL   Creatinine, Ser 2.53 (*) 0.50 - 1.35 mg/dL   Calcium 8.6  8.4 - 66.4 mg/dL   GFR calc non Af Amer 9 (*) >90 mL/min   GFR calc Af Amer 11 (*) >90 mL/min   Comment: (NOTE)     The eGFR has been calculated using the CKD EPI equation.     This calculation has not been validated in all clinical situations.     eGFR's persistently <90 mL/min signify possible Chronic Kidney     Disease.  TYPE AND  SCREEN     Status: None   Collection Time    03/05/13  5:35 PM      Result Value Range   ABO/RH(D) B NEG     Antibody Screen NEG     Sample Expiration 03/08/2013     Unit Number [id number]  Blood Component Type RBC LR PHER1     Unit division 00     Status of Unit ISSUED     Transfusion Status OK TO TRANSFUSE     Crossmatch Result Compatible     Unit Number [id number]     Blood Component Type RBC LR PHER1     Unit division 00     Status of Unit ISS'D ANOTH PT     Transfusion Status OK TO TRANSFUSE     Crossmatch Result Compatible     Unit Number [id number]     Blood Component Type RED CELLS,LR     Unit division 00     Status of Unit ISSUED     Transfusion Status OK TO TRANSFUSE     Crossmatch Result Compatible     Unit Number [id number]     Blood Component Type RED CELLS,LR     Unit division 00     Status of Unit ISS'D ANOTH PT     Transfusion Status OK TO TRANSFUSE     Crossmatch Result Compatible     Unit Number [id number]     Blood Component Type RED CELLS,LR     Unit division 00     Status of Unit ALLOCATED     Transfusion Status OK TO TRANSFUSE     Crossmatch Result Compatible    PREPARE RBC (CROSSMATCH)     Status: None   Collection Time    03/05/13  7:00 PM      Result Value Range   Order Confirmation ORDER PROCESSED BY BLOOD BANK    COMPREHENSIVE METABOLIC PANEL     Status: Abnormal   Collection Time    03/06/13  5:51 AM      Result Value Range   Sodium 130 (*) 135 - 145 mEq/L   Potassium 3.7  3.5 - 5.1 mEq/L   Chloride 94 (*) 96 - 112 mEq/L   CO2 27  19 - 32 mEq/L   Glucose, Bld 190 (*) 70 - 99 mg/dL   BUN 35 (*) 6 - 23 mg/dL   Creatinine, Ser 7.25 (*) 0.50 - 1.35 mg/dL   Calcium 8.5  8.4 - 36.6 mg/dL   Total Protein 5.8 (*) 6.0 - 8.3 g/dL   Albumin 1.3 (*) 3.5 - 5.2 g/dL   AST 28  0 - 37 U/L   ALT 14  0 - 53 U/L   Alkaline Phosphatase 122 (*) 39 - 117 U/L   Total Bilirubin 1.2  0.3 - 1.2 mg/dL   GFR calc non Af Amer 8 (*) >90  mL/min   GFR calc Af Amer 10 (*) >90 mL/min   Comment: (NOTE)     The eGFR has been calculated using the CKD EPI equation.     This calculation has not been validated in all clinical situations.     eGFR's persistently <90 mL/min signify possible Chronic Kidney     Disease.  CBC     Status: Abnormal   Collection Time    03/06/13  5:51 AM      Result Value Range   WBC 8.3  4.0 - 10.5 K/uL   RBC 2.94 (*) 4.22 - 5.81 MIL/uL   Hemoglobin 9.1 (*) 13.0 - 17.0 g/dL   Comment: DELTA CHECK NOTED   HCT 27.5 (*) 39.0 - 52.0 %   MCV 93.5  78.0 - 100.0 fL   MCH 31.0  26.0 - 34.0 pg   MCHC 33.1  30.0 - 36.0 g/dL  RDW 18.6 (*) 11.5 - 15.5 %   Platelets 170  150 - 400 K/uL  PROTIME-INR     Status: Abnormal   Collection Time    03/06/13  5:51 AM      Result Value Range   Prothrombin Time 15.5 (*) 11.6 - 15.2 seconds   INR 1.26  0.00 - 1.49    No results found.  ROS Blood pressure 122/59, pulse 103, temperature 97.6 F (36.4 C), temperature source Axillary, resp. rate 22, height 6\' 3"  (1.905 m), weight 221 lb 1.9 oz (100.3 kg), SpO2 90.00%. Physical Exam Alert and oriented. Skin warm and dry. Oral mucosa is moist.   . Sclera anicteric, conjunctivae is pink. Thyroid not enlarged. No cervical lymphadenopathy. Lungs clear. Heart regular rate and rhythm.  Abdomen is soft, but distended. Bowel sounds are positive. I am unable to palpate his liver.  No abdominal masses felt though he distended No tenderness.  3+ edema to lower extremities.    Assessment/Plan: Melena. Hx of GAVE with recent APC theapy by Dr. Karilyn Cota.  Anemia: Has been transfused and hemoglobin up to 9.1. He has not had a BM since admission. Probably need an EGD with APC tomorrow.  Agree with Cipro for prophlaxis of SBP. Agree with Protonix 40mg  IV daily. I will discuss with Dr. Karilyn Cota.    SETZER,TERRI W 03/06/2013, 8:32 AM     Agree; see attached note.

## 2013-03-06 NOTE — Progress Notes (Signed)
[facility] chart review completed.  

## 2013-03-06 NOTE — Procedures (Signed)
   HEMODIALYSIS TREATMENT NOTE:  3.5 hour heparin-free dialysis session completed through left forearm AVF (15g/antegrade).  Kept even, net +94cc.  All blood was reinfused.  Hemostasis was achieved within 10 minutes.  ST, normal for patient.  Report given to Thurnell Lose, RN.  Arman Filter, RN, CDN

## 2013-03-07 ENCOUNTER — Encounter (HOSPITAL_COMMUNITY)
Admission: EM | Disposition: A | Discharge status: Skilled Nursing Facility | Payer: Self-pay | Source: Home / Self Care | Attending: Internal Medicine

## 2013-03-07 ENCOUNTER — Encounter (HOSPITAL_COMMUNITY): Payer: Self-pay | Admitting: *Deleted

## 2013-03-07 HISTORY — PX: ESOPHAGOGASTRODUODENOSCOPY: SHX5428

## 2013-03-07 LAB — CBC
HCT: 21 % — ABNORMAL LOW (ref 39.0–52.0)
MCHC: 33.3 g/dL (ref 30.0–36.0)
MCV: 93.3 fL (ref 78.0–100.0)
RDW: 18.6 % — ABNORMAL HIGH (ref 11.5–15.5)

## 2013-03-07 LAB — GLUCOSE, CAPILLARY
Glucose-Capillary: 118 mg/dL — ABNORMAL HIGH (ref 70–99)
Glucose-Capillary: 110 mg/dL — ABNORMAL HIGH (ref 70–99)

## 2013-03-07 LAB — BASIC METABOLIC PANEL
BUN: 23 mg/dL (ref 6–23)
CO2: 30 mEq/L (ref 19–32)
Chloride: 101 mEq/L (ref 96–112)
Creatinine, Ser: 4.63 mg/dL — ABNORMAL HIGH (ref 0.50–1.35)

## 2013-03-07 SURGERY — EGD (ESOPHAGOGASTRODUODENOSCOPY)
Anesthesia: Moderate Sedation

## 2013-03-07 MED ORDER — METOCLOPRAMIDE HCL 5 MG/ML IJ SOLN
5.0000 mg | Freq: Four times a day (QID) | INTRAMUSCULAR | Status: DC
  Administered 2013-03-07 – 2013-03-08 (×3): 5 mg via INTRAVENOUS
  Filled 2013-03-07 (×3): qty 2

## 2013-03-07 MED ORDER — SODIUM CHLORIDE 0.9 % IV SOLN
INTRAVENOUS | Status: DC
  Administered 2013-03-07: 09:00:00 via INTRAVENOUS
  Administered 2013-03-09: 500 mL via INTRAVENOUS

## 2013-03-07 MED ORDER — MEPERIDINE HCL 25 MG/ML IJ SOLN
INTRAMUSCULAR | Status: DC | PRN
  Administered 2013-03-07: 25 mg via INTRAVENOUS

## 2013-03-07 MED ORDER — MIDAZOLAM HCL 5 MG/5ML IJ SOLN
INTRAMUSCULAR | Status: AC
  Filled 2013-03-07: qty 10

## 2013-03-07 MED ORDER — MEPERIDINE HCL 50 MG/ML IJ SOLN
INTRAMUSCULAR | Status: AC
  Filled 2013-03-07: qty 1

## 2013-03-07 MED ORDER — MIDAZOLAM HCL 5 MG/5ML IJ SOLN
INTRAMUSCULAR | Status: DC | PRN
  Administered 2013-03-07 (×2): 2 mg via INTRAVENOUS

## 2013-03-07 MED ORDER — STERILE WATER FOR IRRIGATION IR SOLN
Status: DC | PRN
  Administered 2013-03-07: 16:00:00

## 2013-03-07 NOTE — Progress Notes (Signed)
[facility] PROGRESS NOTE  WYNTON HUFSTETLER ZOX:[id number] DOB: 1950-08-21 DOA: 03/05/2013 PCP: Ignatius Specking., MD  Assessment/Plan: Severe anemia, normocytic: Acute on chronic. Acute blood loss anemia secondary to GI bleed. Status post 2 units packed red blood cells. Hemoglobin is back down to 7 today.  Will transfuse 1 unit prbc today.  Plans are for EGD today.    Abdominal pain: Significance unclear. Appears resolved. Exam benign.   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   End-stage renal disease: Management per nephrology.   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 History of TIPS procedure. Per chart review he has been evaluated at [facility] in the past and is not felt to be a candidate for transplant. Patient is planned for paracentesis today.  Diabetes mellitus: Capillary blood sugars stable.   Noncompliance with hemodialysis, history of leaving AMA.   Code Status: full code Family Communication: no family present Disposition Plan: discharge back to [facility] [place] [facility] when stable   Consultants: GI, Dr. Karilyn Cota Nephrology  Procedures:  none  Antibiotics:  Ciprofloxacin daily for sbp prophylaxis  HPI/Subjective: No vomiting, no bowel movement since yesterday.  No shortness of breath. Complaining of swelling in legs.  Objective: Filed Vitals:   03/07/13 0500  BP:   Pulse:   Temp:   Resp: 16    Intake/Output Summary (Last 24 hours) at 03/07/13 0928 Last data filed at 03/06/13 2215  Gross per 24 hour  Intake      0 ml  Output    106 ml  Net   -106 ml   Filed Weights   03/05/13 1703 03/05/13 2145 03/06/13 0900  Weight: 93.441 kg (206 lb) 100.3 kg (221 lb 1.9 oz) 100.8 kg (222 lb 3.6 oz)    Exam:   General:  NAD  Cardiovascular: S1, S2 tachycardic  Respiratory: crackles at bases  Abdomen: soft, distended, positive bowel sounds  Musculoskeletal: 2-3+ edema in LE bilaterally   Data Reviewed: Basic  Metabolic Panel:  Recent Labs Lab 03/05/13 1735 03/06/13 0551 03/07/13 0447  NA 129* 130* 135  K 3.7 3.7 3.8  CL 93* 94* 101  CO2 26 27 30   GLUCOSE 240* 190* 130*  BUN 34* 35* 23  CREATININE 5.97* 6.38* 4.63*  CALCIUM 8.6 8.5 7.9*  PHOS  --   --  2.8   Liver Function Tests:  Recent Labs Lab 03/06/13 0551  AST 28  ALT 14  ALKPHOS 122*  BILITOT 1.2  PROT 5.8*  ALBUMIN 1.3*   No results found for this basename: LIPASE, AMYLASE,  in the last 168 hours No results found for this basename: AMMONIA,  in the last 168 hours CBC:  Recent Labs Lab 03/05/13 1735 03/06/13 0551 03/07/13 0447  WBC 8.9 8.3 7.1  NEUTROABS 5.0  --   --   HGB 6.9* 9.1* 7.0*  HCT 21.3* 27.5* 21.0*  MCV 96.4 93.5 93.3  PLT 181 170 117*   Cardiac Enzymes: No results found for this basename: CKTOTAL, CKMB, CKMBINDEX, TROPONINI,  in the last 168 hours BNP (last 3 results)  Recent Labs  11/21/12 1145  PROBNP 1167.0*   CBG:  Recent Labs Lab 03/06/13 2355 03/07/13 0612  GLUCAP 156* 118*    Recent Results (from the past 240 hour(s))  ANAEROBIC CULTURE     Status: None   Collection Time    02/26/13 10:18 AM      Result Value Range Status   Specimen Description FLUID ASCITIC COLLECTED  BY DOCTOR DR. Tyron Russell   Final   Special Requests NONE   Final   Gram Stain     Final   Value: NO WBC SEEN     NO ORGANISMS SEEN     Performed at [facility]   Culture     Final   Value: NO ANAEROBES ISOLATED     Performed at [facility]   Report Status 03/03/2013 FINAL   Final  BODY FLUID CULTURE     Status: None   Collection Time    02/26/13 10:18 AM      Result Value Range Status   Specimen Description FLUID ASCITIC COLLECTED BY DOCTOR BOLES   Final   Special Requests NONE   Final   Gram Stain     Final   Value: NO WBC SEEN     NO ORGANISMS SEEN     Performed at [facility]   Culture     Final   Value: NO GROWTH 3 DAYS     Performed at [facility]   Report  Status 03/01/2013 FINAL   Final     Studies: No results found.  Scheduled Meds: . antiseptic oral rinse  15 mL Mouth Rinse q12n4p  . chlorhexidine  15 mL Mouth Rinse BID  . ciprofloxacin  500 mg Oral Daily  . folic acid  1 mg Oral QHS  . furosemide  80 mg Oral BID  . hydrocortisone valerate ointment  1 application Topical Q M,W,F  . insulin aspart  0-9 Units Subcutaneous Q6H  . pantoprazole (PROTONIX) IV  40 mg Intravenous Q24H  . sevelamer carbonate  1,600 mg Oral TID WC  . sodium chloride  3 mL Intravenous Q12H  . sucralfate  1 g Oral QID   Continuous Infusions: . sodium chloride 20 mL/hr at 03/07/13 1610    Principal Problem:   Acute blood loss anemia Active Problems:   Alcoholic cirrhosis/remote TIPS   Diabetes mellitus type 2, uncontrolled, with complications   Abdominal pain   GAVE (gastric antral vascular ectasia)   ESRD on dialysis    Time spent:    [facility]  [facility] Pager [phone]. If 7PM-7AM, please contact night-coverage at [email or web address], password [facility] 03/07/2013, 9:28 AM  LOS: 2 days

## 2013-03-07 NOTE — Progress Notes (Signed)
1 unit PRBC started.  [place] paracentis not done today d/t pt having egd today. Possible may be done Friday 03/09/13.

## 2013-03-07 NOTE — Op Note (Signed)
EGD PROCEDURE REPORT  PATIENT:  Timothy Chan  MR#:  [id number] Birthdate:  12/13/1950, 62 y.o., male Endoscopist:  Dr. Malissa Hippo, MD Procedure Date: 03/07/2013  Procedure:   EGD with APC of bleeding telangiectasia at gastric antrum.  Indications:  Patient is 63 year old Caucasian male with end-stage cirrhosis who has a recurrent GI bleed secondary to GAVE. His last APC therapy was on 02/16/2013.            Informed Consent:  The risks, benefits, alternatives & imponderables which include, but are not limited to, bleeding, infection, perforation, drug reaction and potential missed lesion have been reviewed.  The potential for biopsy, lesion removal, esophageal dilation, etc. have also been discussed.  Questions have been answered.  All parties agreeable.  Please see history & physical in medical record for more information.  Medications:  Demerol 25 mg IV Versed  4 mg IV Cetacaine spray topically for oropharyngeal anesthesia  Description of procedure:  The endoscope was introduced through the mouth and advanced to the second portion of the duodenum without difficulty or limitations. The mucosal surfaces were surveyed very carefully during advancement of the scope and upon withdrawal.  Findings:  Esophagus:  Mucosa of the esophagus was normal. No varices are identified. GE junction was unremarkable. GEJ:  43 cm Stomach:  There was large amount of food debris and coffee-ground material in the stomach. Therefore examination was very limited. There was area at gastric antrum along the posterior wall which  Was continuously losing. APC therapy was used for hemostasis. There was oozing from other area and prepyloric region but could not be seen because of presence of food debris. Duodenum:  Not examined.  Therapeutic/Diagnostic Maneuvers Performed:  See above  Complications:  None   Impression: Limited evaluation of upper GI tract secondary to large for debris and some coffee-ground  material. Slow bleed or wheezing noted from telangiectasia at gastric antrum.he was treated with argon plasma coagulator with hemostasis another site with oozing and prepyloric area could not be seen because of food debris.   Recommendations:  Metoclopramide 5 mg IV every 6 hours. Full liquids today. CBC in a.m.   [name]  03/07/2013  4:38 PM  CC: Dr. Ignatius Specking., MD & Dr. Bonnetta Barry ref. provider found

## 2013-03-07 NOTE — Progress Notes (Signed)
Consent forms x2 signed for edg today.

## 2013-03-07 NOTE — Progress Notes (Signed)
Timothy Chan  MRN: [id number]  DOB/AGE: 09-30-1950 62 y.o.  Primary Care Physician:VYAS,DHRUV B., MD  Admit date: 03/05/2013  Chief Complaint:  Chief Complaint  Patient presents with  . Abnormal Lab  . Rectal Bleeding    S-Pt presented on  03/05/2013 with  Chief Complaint  Patient presents with  . Abnormal Lab  . Rectal Bleeding  .    Pt today feels better.    Pt seen after EGD.  meds . antiseptic oral rinse  15 mL Mouth Rinse q12n4p  . chlorhexidine  15 mL Mouth Rinse BID  . ciprofloxacin  500 mg Oral Daily  . folic acid  1 mg Oral QHS  . furosemide  80 mg Oral BID  . hydrocortisone valerate ointment  1 application Topical Q M,W,F  . insulin aspart  0-9 Units Subcutaneous Q6H  . meperidine      . midazolam      . pantoprazole (PROTONIX) IV  40 mg Intravenous Q24H  . sevelamer carbonate  1,600 mg Oral TID WC  . sodium chloride  3 mL Intravenous Q12H  . sucralfate  1 g Oral QID       Physical Exam: Vital signs in last 24 hours: Temp:  [97.7 F (36.5 C)-99.2 F (37.3 C)] 98.1 F (36.7 C) (08/27 1558) Pulse Rate:  [26-119] 118 (08/27 1558) Resp:  [9-25] 20 (08/27 1558) BP: (76-131)/(39-116) 122/74 mmHg (08/27 1558) SpO2:  [85 %-97 %] 93 % (08/27 1558) Weight change: 16 lb 3.6 oz (7.359 kg) Last BM Date: 03/05/13  Intake/Output from previous day: 08/26 0701 - 08/27 0700 In: 75 [P.O.:75] Out: 106 [Urine:200] Total I/O In: 1149.6 [P.O.:360; I.V.:80; Blood:709.6] Out: -    Physical Exam: General- pt is awake,alert, follows commands Resp- No acute REsp distress, Decreased BS at bases. CVS- S1S2 regular in rhythm GIT- BS+, soft, NT, ND EXT- 1+ LE Edema,  Access- left AVF +  Lab Results: CBC  Recent Labs  03/06/13 0551 03/07/13 0447  WBC 8.3 7.1  HGB 9.1* 7.0*  HCT 27.5* 21.0*  PLT 170 117*    BMET  Recent Labs  03/06/13 0551 03/07/13 0447  NA 130* 135  K 3.7 3.8  CL 94* 101  CO2 27 30  GLUCOSE 190* 130*  BUN 35* 23  CREATININE  6.38* 4.63*  CALCIUM 8.5 7.9*    MICRO Recent Results (from the past 240 hour(s))  ANAEROBIC CULTURE     Status: None   Collection Time    02/26/13 10:18 AM      Result Value Range Status   Specimen Description FLUID ASCITIC COLLECTED BY DOCTOR DR. Tyron Russell   Final   Special Requests NONE   Final   Gram Stain     Final   Value: NO WBC SEEN     NO ORGANISMS SEEN     Performed at [facility]   Culture     Final   Value: NO ANAEROBES ISOLATED     Performed at [facility]   Report Status 03/03/2013 FINAL   Final  BODY FLUID CULTURE     Status: None   Collection Time    02/26/13 10:18 AM      Result Value Range Status   Specimen Description FLUID ASCITIC COLLECTED BY DOCTOR BOLES   Final   Special Requests NONE   Final   Gram Stain     Final   Value: NO WBC SEEN     NO ORGANISMS SEEN  Performed at [facility]     Final   Value: NO GROWTH 3 DAYS     Performed at [facility]   Report Status 03/01/2013 FINAL   Final      Lab Results  Component Value Date   CALCIUM 7.9* 03/07/2013   PHOS 2.8 03/07/2013       Impression: 1)Renal  ESRD on HD On MWF schedule Pt was last Dialyzed yesterday as missed tx during week. Pt on MWF as oupt but being done TTs as inpt Hx of NON compliance with HD   2)HTN  BP at goal   3)Anemia HGb not at goal (9--11) Admitted with GI bleed ( hx of GAVE) Also has Anemia of Ch Disease HGb improving after PRBC.   4)Hyperphosphatemia Sec to ON compliance with Dietary regimen On Binders  5)Liver Hx of Alc Cirrhosis.  6)Electrolytes Normokalemic NOrmonatremic   7)Acid base Co2 at goal     Plan:  Continue current tx Will dialyze in am       Docia Klar S 03/07/2013, 4:29 PM

## 2013-03-07 NOTE — Clinical Social Work Note (Signed)
CSW updated facility on patient status, relayed concerns patient voice regarding diet (spicy and salty) and not getting medications (Lasix and Nexium).  Per admissions, these concerns have been relayed to RN supervisor at facility and facility will address.  Santa Genera, LCSW Clinical Social Worker [phone])

## 2013-03-08 ENCOUNTER — Inpatient Hospital Stay (HOSPITAL_COMMUNITY): Payer: Medicare Other

## 2013-03-08 ENCOUNTER — Encounter (HOSPITAL_COMMUNITY): Payer: Self-pay | Admitting: Internal Medicine

## 2013-03-08 DIAGNOSIS — E43 Unspecified severe protein-calorie malnutrition: Secondary | ICD-10-CM | POA: Diagnosis present

## 2013-03-08 LAB — GLUCOSE, CAPILLARY
Glucose-Capillary: 115 mg/dL — ABNORMAL HIGH (ref 70–99)
Glucose-Capillary: 119 mg/dL — ABNORMAL HIGH (ref 70–99)
Glucose-Capillary: 124 mg/dL — ABNORMAL HIGH (ref 70–99)

## 2013-03-08 LAB — CBC
HCT: 24.3 % — ABNORMAL LOW (ref 39.0–52.0)
MCH: 31.9 pg (ref 26.0–34.0)
MCV: 92.4 fL (ref 78.0–100.0)
Platelets: 121 10*3/uL — ABNORMAL LOW (ref 150–400)
RDW: 17.8 % — ABNORMAL HIGH (ref 11.5–15.5)

## 2013-03-08 LAB — BASIC METABOLIC PANEL
BUN: 34 mg/dL — ABNORMAL HIGH (ref 6–23)
CO2: 27 mEq/L (ref 19–32)
Calcium: 8.4 mg/dL (ref 8.4–10.5)
Creatinine, Ser: 5.36 mg/dL — ABNORMAL HIGH (ref 0.50–1.35)
GFR calc Af Amer: 12 mL/min — ABNORMAL LOW (ref >90)

## 2013-03-08 MED ORDER — METOPROLOL TARTRATE 25 MG PO TABS
25.0000 mg | ORAL_TABLET | Freq: Two times a day (BID) | ORAL | Status: DC
  Administered 2013-03-08: 25 mg via ORAL
  Filled 2013-03-08: qty 1

## 2013-03-08 MED ORDER — METOCLOPRAMIDE HCL 10 MG PO TABS
5.0000 mg | ORAL_TABLET | Freq: Three times a day (TID) | ORAL | Status: DC
  Administered 2013-03-08 – 2013-03-11 (×10): 5 mg via ORAL
  Administered 2013-03-11: 19:00:00 via ORAL
  Administered 2013-03-11 – 2013-03-16 (×16): 5 mg via ORAL
  Filled 2013-03-08 (×27): qty 1

## 2013-03-08 MED ORDER — TRAMADOL HCL 50 MG PO TABS
50.0000 mg | ORAL_TABLET | Freq: Once | ORAL | Status: AC
  Administered 2013-03-08: 50 mg via ORAL

## 2013-03-08 MED ORDER — METOPROLOL TARTRATE 25 MG PO TABS
12.5000 mg | ORAL_TABLET | Freq: Two times a day (BID) | ORAL | Status: DC
  Administered 2013-03-08: 12.5 mg via ORAL
  Filled 2013-03-08: qty 1

## 2013-03-08 MED ORDER — SODIUM CHLORIDE 0.9 % IV SOLN: INTRAVENOUS | Status: DC

## 2013-03-08 NOTE — Procedures (Signed)
   HEMODIALYSIS TREATMENT NOTE:  3.5 hour heparin-free dialysis session completed via left forearm AVF (15g/antegrade).  Goal NOT met:  Unable to tolerate removal of 2-3 liters as prescribed.  Ultrafiltration time was interrupted for 2 hours due to hypotension (SBP<80).  Pt received 1 unit PRBCs during dialysis and required frequent NS boluses to maintain SBP>90.  Net +352cc.  All blood was reinfused.  Hemostasis was achieved within 10 minutes.  Report given to Thurnell Lose, RN.  Kristilyn Coltrane L. Zeyna Mkrtchyan, RN, CDN

## 2013-03-08 NOTE — Progress Notes (Signed)
[facility] PROGRESS NOTE  Timothy Chan WUJ:[id number] DOB: 1950-07-25 DOA: 03/05/2013 PCP: Ignatius Specking., MD  Assessment/Plan: Severe anemia, normocytic: Acute on chronic. Acute blood loss anemia secondary to GI bleed. Status post 3 units packed red blood cells. EGD yesterday showed slow bleed from telangiectasia at gastric antrum, treated with APC.  Will transfuse another unit of prbc today and monitor for clinical response  Tachycardia.  Unclear etiology.  Will try low dose beta blocker.  Appears to be sinus. Possibly related to anemia and therefore transfusion of prbc may help.   Abdominal pain: Significance unclear. Appears resolved. Exam benign.   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   End-stage renal disease: Management per nephrology. Consider fluid removal due to gross volume overload  Alcoholic liver disease: Complicated by esophageal varices, GAVE and recurrent ascites. History of TIPS procedure. Per chart review he has been evaluated at [facility] in the past and is not felt to be a candidate for transplant. Patient is planned for therapeutic paracentesis tomorrow.  Diabetes mellitus: Capillary blood sugars stable.   Noncompliance with hemodialysis, history of leaving AMA.   Code Status: full code Family Communication: no family present Disposition Plan: discharge back to [place] [facility] when stable   Consultants: GI, Dr. Karilyn Cota Nephrology  Procedures:  none  Antibiotics:  Ciprofloxacin daily for sbp prophylaxis  HPI/Subjective: No bowel movement, no vomiting.  Objective: Filed Vitals:   03/08/13 0400  BP: 124/92  Pulse:   Temp: 98.3 F (36.8 C)  Resp:     Intake/Output Summary (Last 24 hours) at 03/08/13 0842 Last data filed at 03/08/13 0600  Gross per 24 hour  Intake 1389.58 ml  Output    200 ml  Net 1189.58 ml   Filed Weights   03/05/13 2145 03/06/13 0900 03/08/13 0500  Weight: 100.3 kg (221 lb 1.9 oz) 100.8 kg (222 lb 3.6 oz)  103.7 kg (228 lb 9.9 oz)    Exam:   General:  NAD  Cardiovascular: S1, S2 tachycardic  Respiratory: crackles at bases  Abdomen: soft, distended, positive bowel sounds  Musculoskeletal: 2-3+ edema in LE bilaterally   Data Reviewed: Basic Metabolic Panel:  Recent Labs Lab 03/05/13 1735 03/06/13 0551 03/07/13 0447 03/08/13 0625  NA 129* 130* 135 133*  K 3.7 3.7 3.8 4.1  CL 93* 94* 101 98  CO2 26 27 30 27   GLUCOSE 240* 190* 130* 115*  BUN 34* 35* 23 34*  CREATININE 5.97* 6.38* 4.63* 5.36*  CALCIUM 8.6 8.5 7.9* 8.4  PHOS  --   --  2.8  --    Liver Function Tests:  Recent Labs Lab 03/06/13 0551  AST 28  ALT 14  ALKPHOS 122*  BILITOT 1.2  PROT 5.8*  ALBUMIN 1.3*   No results found for this basename: LIPASE, AMYLASE,  in the last 168 hours No results found for this basename: AMMONIA,  in the last 168 hours CBC:  Recent Labs Lab 03/05/13 1735 03/06/13 0551 03/07/13 0447 03/08/13 0625  WBC 8.9 8.3 7.1 8.5  NEUTROABS 5.0  --   --   --   HGB 6.9* 9.1* 7.0* 8.4*  HCT 21.3* 27.5* 21.0* 24.3*  MCV 96.4 93.5 93.3 92.4  PLT 181 170 117* 121*   Cardiac Enzymes: No results found for this basename: CKTOTAL, CKMB, CKMBINDEX, TROPONINI,  in the last 168 hours BNP (last 3 results)  Recent Labs  11/21/12 1145  PROBNP 1167.0*   CBG:  Recent Labs Lab 03/07/13 1131 03/07/13  1559 03/07/13 1824 03/08/13 0006 03/08/13 0520  GLUCAP 122* 123* 110* 136* 108*    Recent Results (from the past 240 hour(s))  ANAEROBIC CULTURE     Status: None   Collection Time    02/26/13 10:18 AM      Result Value Range Status   Specimen Description FLUID ASCITIC COLLECTED BY DOCTOR DR. Tyron Russell   Final   Special Requests NONE   Final   Gram Stain     Final   Value: NO WBC SEEN     NO ORGANISMS SEEN     Performed at [facility]   Culture     Final   Value: NO ANAEROBES ISOLATED     Performed at [facility]   Report Status 03/03/2013 FINAL   Final  BODY  FLUID CULTURE     Status: None   Collection Time    02/26/13 10:18 AM      Result Value Range Status   Specimen Description FLUID ASCITIC COLLECTED BY DOCTOR BOLES   Final   Special Requests NONE   Final   Gram Stain     Final   Value: NO WBC SEEN     NO ORGANISMS SEEN     Performed at [facility]   Culture     Final   Value: NO GROWTH 3 DAYS     Performed at [facility]   Report Status 03/01/2013 FINAL   Final     Studies: US Paracentesis  03/07/2013   *RADIOLOGY REPORT*  Clinical Data: Alcoholic cirrhosis, ascites  LIMITED ABDOMEN ULTRASOUND FOR ASCITES  Technique:  Limited ultrasound survey for ascites was performed in all four abdominal quadrants.  Comparison:  02/26/2013  Findings: Survey of the abdomen for ascites demonstrates no significant ascites in the right upper quadrant Minimal ascites in the right lower quadrant. Larger pockets of ascites are seen in the left lower quadrant and to a lesser degree in left upper quadrant, though substantially less than was present at the time of the prior exam.  IMPRESSION: Less ascites is present on the current study than was seen previously. At the time of previous imaging, only 2 liters of ascites could be aspirated and the current ascites volume appears to be much less. Discussed with Dr. Kerry Hough; will hold off on paracentesis today and reassess in several days in anticipation of performing paracentesis prior to discharge.   Original Report Authenticated By: Ulyses Southward, M.D.    Scheduled Meds: . antiseptic oral rinse  15 mL Mouth Rinse q12n4p  . chlorhexidine  15 mL Mouth Rinse BID  . ciprofloxacin  500 mg Oral Daily  . folic acid  1 mg Oral QHS  . furosemide  80 mg Oral BID  . hydrocortisone valerate ointment  1 application Topical Q M,W,F  . insulin aspart  0-9 Units Subcutaneous Q6H  . metoCLOPramide (REGLAN) injection  5 mg Intravenous Q6H  . metoprolol tartrate  25 mg Oral BID  . pantoprazole (PROTONIX) IV  40 mg  Intravenous Q24H  . sevelamer carbonate  1,600 mg Oral TID WC  . sodium chloride  3 mL Intravenous Q12H  . sucralfate  1 g Oral QID   Continuous Infusions: . sodium chloride 20 mL/hr at 03/08/13 0600    Principal Problem:   Acute blood loss anemia Active Problems:   Alcoholic cirrhosis/remote TIPS   Diabetes mellitus type 2, uncontrolled, with complications   GI bleed   Abdominal pain   Thrombocytopenia, unspecified  GAVE (gastric antral vascular ectasia)   ESRD on dialysis   Protein-calorie malnutrition, severe    Time spent:    [facility]  [facility] Pager [phone]. If 7PM-7AM, please contact night-coverage at [email or web address], password [facility] 03/08/2013, 8:42 AM  LOS: 3 days

## 2013-03-08 NOTE — Progress Notes (Signed)
Patient has no iv access. Multiple attempts were unsuccessful. Dr Kerry Hough aware. Endo nurses will attempt access in am prior to EGD. Per Dr Kerry Hough, if patient requires iv access during the night, central line will need to be placed by surgeon.

## 2013-03-08 NOTE — Progress Notes (Signed)
Subjective: Interval History: has no complaint of nausea or vomiting. Presently patient claims to be feeling better. His appetite is good and he doesn't have any difficulty in  breathing..  Objective: Vital signs in last 24 hours: Temp:  [97.7 F (36.5 C)-98.8 F (37.1 C)] 98.3 F (36.8 C) (08/28 0400) Pulse Rate:  [26-128] 86 (08/27 1700) Resp:  [10-20] 14 (08/27 1800) BP: (64-148)/(32-96) 124/92 mmHg (08/28 0400) SpO2:  [77 %-100 %] 77 % (08/27 1700) Weight:  [103.7 kg (228 lb 9.9 oz)] 103.7 kg (228 lb 9.9 oz) (08/28 0500) Weight change: 2.9 kg (6 lb 6.3 oz)  Intake/Output from previous day: 08/27 0701 - 08/28 0700 In: 1629.6 [P.O.:480; I.V.:440; Blood:709.6] Out: 200 [Urine:200] Intake/Output this shift: Total I/O In: 240 [P.O.:240] Out: -   General appearance: alert, cooperative and no distress Resp: clear to auscultation bilaterally Cardio: regular rate and rhythm, S1, S2 normal, no murmur, click, rub or gallop GI: soft, non-tender; bowel sounds normal; no masses,  no organomegaly Extremities: extremities normal, atraumatic, no cyanosis or edema  Lab Results:  Recent Labs  03/07/13 0447 03/08/13 0625  WBC 7.1 8.5  HGB 7.0* 8.4*  HCT 21.0* 24.3*  PLT 117* 121*   BMET:  Recent Labs  03/07/13 0447 03/08/13 0625  NA 135 133*  K 3.8 4.1  CL 101 98  CO2 30 27  GLUCOSE 130* 115*  BUN 23 34*  CREATININE 4.63* 5.36*  CALCIUM 7.9* 8.4   No results found for this basename: PTH,  in the last 72 hours Iron Studies: No results found for this basename: IRON, TIBC, TRANSFERRIN, FERRITIN,  in the last 72 hours  Studies/Results: US Paracentesis  03/07/2013   *RADIOLOGY REPORT*  Clinical Data: Alcoholic cirrhosis, ascites  LIMITED ABDOMEN ULTRASOUND FOR ASCITES  Technique:  Limited ultrasound survey for ascites was performed in all four abdominal quadrants.  Comparison:  02/26/2013  Findings: Survey of the abdomen for ascites demonstrates no significant ascites in the  right upper quadrant Minimal ascites in the right lower quadrant. Larger pockets of ascites are seen in the left lower quadrant and to a lesser degree in left upper quadrant, though substantially less than was present at the time of the prior exam.  IMPRESSION: Less ascites is present on the current study than was seen previously. At the time of previous imaging, only 2 liters of ascites could be aspirated and the current ascites volume appears to be much less. Discussed with Dr. Kerry Hough; will hold off on paracentesis today and reassess in several days in anticipation of performing paracentesis prior to discharge.   Original Report Authenticated By: Ulyses Southward, M.D.    I have reviewed the patient's current medications.  Assessment/Plan: Problem #1 end-stage renal disease is status post 1 ounces at the yesterday BUN is 34 creatinine is 5.36 potassium of 4.1. Presently he doesn't have any uremic sinus symptoms. Problem #2 anemia this is secondary to GI bleeding. His hemoglobin hematocrit is slightly better today. Patient has been receiving blood transfusion. Problem #3 diabetes Problem #4 history of liver cirrhosis  Problem #5 metabolic bone disease his calcium and phosphorus is stable. Problem #6 noncompliance with dialysis Problem #7 trauma cytopenia Problem #8 hepatic encephalopathy. Patient is alert and answering questions appropriately. Plan: Will dialyze patient today We'll continue his present management and we'll check his phosphorus and basic metabolic panel in the morning.   LOS: 3 days   Loretto Belinsky S 03/08/2013,10:17 AM

## 2013-03-08 NOTE — Progress Notes (Signed)
Patient denies nausea or vomiting. He complains of fullness to his abdomen. Hemoglobin this morning was 8.4 g. He has received another unit of PRBCs during hemodialysis. I called Dr. Pollyann Samples, patient's transplant hepatologist at [facility] earlier today. Dr. Sharlene Motts is known patient for years and initially saw him at [facility]. Ventilation was evaluated for hepatorenal transplant their concern was lack of support. Now that patient is not ambulatory he is not a candidate for hepatorenal transplant unless he improves and becomes ambulatory. He recommended office visit when patient is back on his feet and he should bring along his son on a family member who would be committed to providing postop care if he is lucky enough to undergo transplant surgery. Will plan EGD in a.m.

## 2013-03-09 ENCOUNTER — Inpatient Hospital Stay (HOSPITAL_COMMUNITY): Payer: Medicare Other

## 2013-03-09 ENCOUNTER — Encounter (HOSPITAL_COMMUNITY)
Admission: EM | Disposition: A | Discharge status: Skilled Nursing Facility | Payer: Self-pay | Source: Home / Self Care | Attending: Internal Medicine

## 2013-03-09 ENCOUNTER — Encounter (HOSPITAL_COMMUNITY): Payer: Self-pay

## 2013-03-09 HISTORY — PX: ESOPHAGOGASTRODUODENOSCOPY: SHX5428

## 2013-03-09 LAB — BASIC METABOLIC PANEL
BUN: 21 mg/dL (ref 6–23)
Chloride: 101 mEq/L (ref 96–112)
GFR calc Af Amer: 20 mL/min — ABNORMAL LOW (ref >90)
Potassium: 3.3 mEq/L — ABNORMAL LOW (ref 3.5–5.1)
Sodium: 136 mEq/L (ref 135–145)

## 2013-03-09 LAB — CBC
HCT: 24.4 % — ABNORMAL LOW (ref 39.0–52.0)
Platelets: 99 10*3/uL — ABNORMAL LOW (ref 150–400)
RDW: 17 % — ABNORMAL HIGH (ref 11.5–15.5)
WBC: 6.3 10*3/uL (ref 4.0–10.5)

## 2013-03-09 LAB — TYPE AND SCREEN
Unit division: 0
Unit division: 0
Unit division: 0
Unit division: 0

## 2013-03-09 LAB — BODY FLUID CELL COUNT WITH DIFFERENTIAL: Neutrophil Count, Fluid: 2 % (ref 0–25)

## 2013-03-09 LAB — GLUCOSE, CAPILLARY: Glucose-Capillary: 93 mg/dL (ref 70–99)

## 2013-03-09 LAB — PREPARE RBC (CROSSMATCH)

## 2013-03-09 LAB — PHOSPHORUS: Phosphorus: 2.3 mg/dL (ref 2.3–4.6)

## 2013-03-09 LAB — GRAM STAIN: Gram Stain: NONE SEEN

## 2013-03-09 SURGERY — EGD (ESOPHAGOGASTRODUODENOSCOPY)
Anesthesia: Moderate Sedation

## 2013-03-09 MED ORDER — LIDOCAINE HCL (PF) 1 % IJ SOLN
INTRAMUSCULAR | Status: AC
  Filled 2013-03-09: qty 5

## 2013-03-09 MED ORDER — MEPERIDINE HCL 50 MG/ML IJ SOLN
INTRAMUSCULAR | Status: AC
  Filled 2013-03-09: qty 1

## 2013-03-09 MED ORDER — STERILE WATER FOR IRRIGATION IR SOLN
Status: DC | PRN
  Administered 2013-03-09: 15:00:00

## 2013-03-09 MED ORDER — MIDAZOLAM HCL 5 MG/5ML IJ SOLN
INTRAMUSCULAR | Status: DC | PRN
  Administered 2013-03-09: 2 mg via INTRAVENOUS
  Administered 2013-03-09: 1 mg via INTRAVENOUS
  Administered 2013-03-09: 2 mg via INTRAVENOUS

## 2013-03-09 MED ORDER — SODIUM CHLORIDE 0.9 % IJ SOLN
10.0000 mL | Freq: Two times a day (BID) | INTRAMUSCULAR | Status: DC
  Administered 2013-03-09 – 2013-03-16 (×13): 10 mL via INTRAVENOUS

## 2013-03-09 MED ORDER — MIDAZOLAM HCL 2 MG/2ML IJ SOLN
INTRAMUSCULAR | Status: AC
  Filled 2013-03-09: qty 2

## 2013-03-09 MED ORDER — MIDAZOLAM HCL 5 MG/5ML IJ SOLN
INTRAMUSCULAR | Status: AC
  Filled 2013-03-09: qty 5

## 2013-03-09 MED ORDER — SEVELAMER CARBONATE 800 MG PO TABS
800.0000 mg | ORAL_TABLET | Freq: Three times a day (TID) | ORAL | Status: DC
  Administered 2013-03-09 – 2013-03-16 (×17): 800 mg via ORAL
  Filled 2013-03-09 (×17): qty 1

## 2013-03-09 MED ORDER — MEPERIDINE HCL 25 MG/ML IJ SOLN
INTRAMUSCULAR | Status: DC | PRN
  Administered 2013-03-09: 25 mg via INTRAVENOUS

## 2013-03-09 MED ORDER — BUTAMBEN-TETRACAINE-BENZOCAINE 2-2-14 % EX AERO
INHALATION_SPRAY | CUTANEOUS | Status: DC | PRN
  Administered 2013-03-09: 2 via TOPICAL

## 2013-03-09 NOTE — Procedures (Signed)
Central Venous Catheter Insertion Procedure Note Timothy Chan [id number] 1951/06/04  Procedure: Insertion of Central Venous Catheter Indications: Assessment of intravascular volume and Frequent blood sampling  Procedure Details Consent: Risks of procedure as well as the alternatives and risks of each were explained to the (patient/caregiver).  Consent for procedure obtained. Time Out: Verified patient identification, verified procedure, site/side was marked, verified correct patient position, special equipment/implants available, medications/allergies/relevent history reviewed, required imaging and test results available.  Performed  Maximum sterile technique was used including antiseptics, cap, gloves, gown, hand hygiene, mask and sheet. Skin prep: Chlorhexidine; local anesthetic administered A antimicrobial bonded/coated triple lumen catheter was placed in the right subclavian vein using the Seldinger technique.  Evaluation Blood flow good Complications: No apparent complications Patient did tolerate procedure well. Chest X-ray ordered to verify placement.  CXR: pending.  Timothy Chan C 03/09/2013, 1:11 PM

## 2013-03-09 NOTE — Progress Notes (Signed)
Paracentesis complete no signs of distress 1080 mls yellow abdominal fluid removed. No signs of distress.

## 2013-03-09 NOTE — Clinical Social Work Note (Signed)
CSW reviewed chart, spoke w RN in progression.  Updated [facility] admissions Zella Ball) on patient status.  Santa Genera, LCSW Clinical Social Worker [phone])

## 2013-03-09 NOTE — Progress Notes (Signed)
Arrived in the endoscopy pre-op following a paracentesis in the radiology dept. Alert and oriented.  Triple lumen catheter inplace to right subclavian. Dressing has moderate amt.of bleeding underneath op-site dressing.  Dr. Leticia Penna states that line is OK to use. "No pneumothroax" noted in radiology report.

## 2013-03-09 NOTE — Op Note (Signed)
EGD PROCEDURE REPORT  PATIENT:  Timothy Chan  MR#:  [id number] Birthdate:  1951/06/29, 62 y.o., male Endoscopist:  Dr. Malissa Hippo, MD  Procedure Date: 03/09/2013  Procedure:   EGD with therapeutic intervention.  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 His last exam was 2 days ago with only one bleeding site was created. The stomach is full of food and clots.            Informed Consent:  The risks, benefits, alternatives & imponderables which include, but are not limited to, bleeding, infection, perforation, drug reaction and potential missed lesion have been reviewed.  The potential for biopsy, lesion removal, esophageal dilation, etc. have also been discussed.  Questions have been answered.  All parties agreeable.  Please see history & physical in medical record for more information.  Medications:  Demerol 25 mg IV Versed 5 mg IV Cetacaine spray topically for oropharyngeal anesthesia  Description of procedure:  The endoscope was introduced through the mouth and advanced to the second portion of the duodenum without difficulty or limitations. The mucosal surfaces were surveyed very carefully during advancement of the scope and upon withdrawal.  Findings:  Esophagus:  Mucosa of the esophagus was normal. GE junction was unremarkable. GEJ:  43 cm Stomach:  There was a small amount of fresh blood in the gastric body and fresh blood at the antrum along with clot. Multiple antral telangiectasia were noted. Mucosa and body revealed mosaic pattern. Pyloric channel was patent. No varices are identified at the fundus. Clot was removed. It was oozing from multiple sites all of which were treated with argon plasma coagulator with hemostasis.  Duodenum:  The bulbar and post bulbar mucosa was normal.  Therapeutic/Diagnostic Maneuvers Performed:   Bleeding gave coagulated with APC with  hemostasis.  Complications:  None  Impression: Portal gastropathy. 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 Bleeding controlled with argon plasma coagulation.  Recommendations:  Continue sucralfate for now. Renal diet. CBC in a.m.  REHMAN,NAJEEB U  03/09/2013  3:23 PM  CC: Dr. Ignatius Specking., MD & Dr. Bonnetta Barry ref. provider found

## 2013-03-09 NOTE — Progress Notes (Signed)
Patient has CVC placed by Dr Leticia Penna this am. Radiology report states that line needs to be repositioned. Per endo nurse Dr Leticia Penna stated that the line is good to use, but there is no note by doctor stating this. Blood transfusion has not been given as patient was off the unit for procedures, and new type and cross had to be obtained on return. This information relayed to Rockwell Germany RN, oncoming night nurse. Paracentesis site continues to ooze clear yellow fluid. Dr Kerry Hough made aware, no intervention required at this time.

## 2013-03-09 NOTE — Procedures (Signed)
PreOperative Dx: Cirrhosis, ascites Postoperative Dx: Cirrhosis, ascites Procedure:   US guided paracentesis Radiologist:  Tyron Russell Anesthesia:  9 ml of 1% lidocaine Specimen:  1080 ml of yellow clear ascitic fluid EBL:   < 1 ml Complications: None

## 2013-03-09 NOTE — Progress Notes (Signed)
Discussed with Dr. Leticia Penna, use of Right Subclavian, OK to use. Recommend rate no greater than 200 cc/hr.  If long term IV therapy needed, willing to place port next week.  If needs additional access now Interventional Radiology may be needed to reposition present line.  Will monitor patient closely for complaints. Blood administration needed tonight.  One unit

## 2013-03-09 NOTE — OR Nursing (Signed)
Dr. Leticia Penna said central line is in IJ but ok to use for EGD.

## 2013-03-09 NOTE — Progress Notes (Signed)
[facility] PROGRESS NOTE  Timothy Chan ION:[id number] DOB: 19-Apr-1951 DOA: 03/05/2013 PCP: Ignatius Specking., MD  Assessment/Plan: Severe anemia, normocytic: Acute on chronic. Acute blood loss anemia secondary to GI bleed. Status post 4 units packed red blood cells. EGD 8/27 showed slow bleed from telangiectasia at gastric antrum, treated with APC. Plan is for repeat EGD today.  Patient had black stools overnight.  Hemoglobin is unchanged after transfusion of 1 unit of prbc yesterday.  Will likely need another prbc transfusion today.  Abdominal pain: Significance unclear. Appears resolved. Exam benign.   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   End-stage renal disease: Management per nephrology. Fluid removal has been difficult due to hypotension.  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 History of TIPS procedure. Per chart review he has been evaluated at [facility] in the past and is not felt to be a candidate for transplant.  Diabetes mellitus: Capillary blood sugars stable.   Noncompliance with hemodialysis, history of leaving AMA.  IV access. Patient is currently lacking any IV access issues.  Blood pressure is low.  Will request a surgery consult to evaluate for central line placement.  With his recurrent admissions and poor venous access, he may be a candidate for a port-a-cath placement. Will ask that surgery evaluate for this as well.   Code Status: full code Family Communication: no family present Disposition Plan: discharge back to [place] [facility] when stable   Consultants: GI, Dr. Karilyn Cota Nephrology General Surgery Procedures:  none  Antibiotics:  Ciprofloxacin daily for sbp prophylaxis  HPI/Subjective: Patient had black colored stools overnight.  Objective: Filed Vitals:   03/09/13 0800  BP: 82/43  Pulse:   Temp:   Resp:     Intake/Output Summary (Last 24 hours) at 03/09/13 0843 Last data filed at  03/09/13 0000  Gross per 24 hour  Intake    870 ml  Output   -352 ml  Net   1222 ml   Filed Weights   03/08/13 1230 03/08/13 1635 03/09/13 0520  Weight: 103.7 kg (228 lb 9.9 oz) 104.1 kg (229 lb 8 oz) 104.9 kg (231 lb 4.2 oz)    Exam:   General:  NAD  Cardiovascular: S1, S2 rrr  Respiratory: crackles at bases  Abdomen: soft, distended, positive bowel sounds  Musculoskeletal: 2-3+ edema in LE bilaterally   Data Reviewed: Basic Metabolic Panel:  Recent Labs Lab 03/05/13 1735 03/06/13 0551 03/07/13 0447 03/08/13 0625 03/09/13 0453  NA 129* 130* 135 133* 136  K 3.7 3.7 3.8 4.1 3.3*  CL 93* 94* 101 98 101  CO2 26 27 30 27 29   GLUCOSE 240* 190* 130* 115* 85  BUN 34* 35* 23 34* 21  CREATININE 5.97* 6.38* 4.63* 5.36* 3.60*  CALCIUM 8.6 8.5 7.9* 8.4 7.8*  PHOS  --   --  2.8  --  2.3   Liver Function Tests:  Recent Labs Lab 03/06/13 0551  AST 28  ALT 14  ALKPHOS 122*  BILITOT 1.2  PROT 5.8*  ALBUMIN 1.3*   No results found for this basename: LIPASE, AMYLASE,  in the last 168 hours No results found for this basename: AMMONIA,  in the last 168 hours CBC:  Recent Labs Lab 03/05/13 1735 03/06/13 0551 03/07/13 0447 03/08/13 0625 03/09/13 0453  WBC 8.9 8.3 7.1 8.5 6.3  NEUTROABS 5.0  --   --   --   --   HGB 6.9* 9.1* 7.0* 8.4* 8.4*  HCT  21.3* 27.5* 21.0* 24.3* 24.4*  MCV 96.4 93.5 93.3 92.4 91.4  PLT 181 170 117* 121* 99*   Cardiac Enzymes: No results found for this basename: CKTOTAL, CKMB, CKMBINDEX, TROPONINI,  in the last 168 hours BNP (last 3 results)  Recent Labs  11/21/12 1145  PROBNP 1167.0*   CBG:  Recent Labs Lab 03/08/13 0520 03/08/13 1109 03/08/13 1835 03/08/13 2344 03/09/13 0506  GLUCAP 108* 119* 124* 115* 80    No results found for this or any previous visit (from the past 240 hour(s)).   Studies: US Paracentesis  03/07/2013   *RADIOLOGY REPORT*  Clinical Data: Alcoholic cirrhosis, ascites  LIMITED ABDOMEN ULTRASOUND FOR  ASCITES  Technique:  Limited ultrasound survey for ascites was performed in all four abdominal quadrants.  Comparison:  02/26/2013  Findings: Survey of the abdomen for ascites demonstrates no significant ascites in the right upper quadrant Minimal ascites in the right lower quadrant. Larger pockets of ascites are seen in the left lower quadrant and to a lesser degree in left upper quadrant, though substantially less than was present at the time of the prior exam.  IMPRESSION: Less ascites is present on the current study than was seen previously. At the time of previous imaging, only 2 liters of ascites could be aspirated and the current ascites volume appears to be much less. Discussed with Dr. Kerry Hough; will hold off on paracentesis today and reassess in several days in anticipation of performing paracentesis prior to discharge.   Original Report Authenticated By: Ulyses Southward, M.D.    Scheduled Meds: . antiseptic oral rinse  15 mL Mouth Rinse q12n4p  . chlorhexidine  15 mL Mouth Rinse BID  . ciprofloxacin  500 mg Oral Daily  . folic acid  1 mg Oral QHS  . furosemide  80 mg Oral BID  . hydrocortisone valerate ointment  1 application Topical Q M,W,F  . insulin aspart  0-9 Units Subcutaneous Q6H  . metoCLOPramide  5 mg Oral TID AC & HS  . pantoprazole (PROTONIX) IV  40 mg Intravenous Q24H  . sevelamer carbonate  800 mg Oral TID WC  . sodium chloride  3 mL Intravenous Q12H  . sucralfate  1 g Oral QID   Continuous Infusions: . sodium chloride 20 mL/hr at 03/08/13 0600  . sodium chloride      Principal Problem:   Acute blood loss anemia Active Problems:   Alcoholic cirrhosis/remote TIPS   Diabetes mellitus type 2, uncontrolled, with complications   GI bleed   Abdominal pain   Thrombocytopenia, unspecified   GAVE (gastric antral vascular ectasia)   ESRD on dialysis   Protein-calorie malnutrition, severe    Time spent:    [facility]  [facility] Pager [phone]. If  7PM-7AM, please contact night-coverage at [email or web address], password [facility] 03/09/2013, 8:43 AM  LOS: 4 days

## 2013-03-09 NOTE — OR Nursing (Signed)
Dr. Karilyn Cota in and notified of central line placement. Dr. Karilyn Cota viewed the chest x-ray and said ok to use central line for procedure.

## 2013-03-09 NOTE — Progress Notes (Signed)
Subjective: Interval History: has no complaint of nausea or vomiting. Presently patient claims to be feeling better. His appetite is good and he doesn't have any difficulty in  breathing..  Objective: Vital signs in last 24 hours: Temp:  [97.9 F (36.6 C)-98.8 F (37.1 C)] 98.6 F (37 C) (08/29 0400) Pulse Rate:  [96-123] 96 (08/28 1635) Resp:  [14-18] 18 (08/29 0000) BP: (62-113)/(33-63) 84/44 mmHg (08/29 0600) SpO2:  [95 %-96 %] 96 % (08/29 0000) Weight:  [103.7 kg (228 lb 9.9 oz)-104.9 kg (231 lb 4.2 oz)] 104.9 kg (231 lb 4.2 oz) (08/29 0520) Weight change: 0 kg (0 lb)  Intake/Output from previous day: 08/28 0701 - 08/29 0700 In: 870 [P.O.:720; I.V.:150] Out: -352  Intake/Output this shift:    General appearance: alert, cooperative and no distress Resp: clear to auscultation bilaterally Cardio: regular rate and rhythm, S1, S2 normal, no murmur, click, rub or gallop GI: soft, non-tender; bowel sounds normal; no masses,  no organomegaly Extremities: extremities normal, atraumatic, no cyanosis or edema  Lab Results:  Recent Labs  03/08/13 0625 03/09/13 0453  WBC 8.5 6.3  HGB 8.4* 8.4*  HCT 24.3* 24.4*  PLT 121* 99*   BMET:   Recent Labs  03/08/13 0625 03/09/13 0453  NA 133* 136  K 4.1 3.3*  CL 98 101  CO2 27 29  GLUCOSE 115* 85  BUN 34* 21  CREATININE 5.36* 3.60*  CALCIUM 8.4 7.8*   No results found for this basename: PTH,  in the last 72 hours Iron Studies: No results found for this basename: IRON, TIBC, TRANSFERRIN, FERRITIN,  in the last 72 hours  Studies/Results: US Paracentesis  03/07/2013   *RADIOLOGY REPORT*  Clinical Data: Alcoholic cirrhosis, ascites  LIMITED ABDOMEN ULTRASOUND FOR ASCITES  Technique:  Limited ultrasound survey for ascites was performed in all four abdominal quadrants.  Comparison:  02/26/2013  Findings: Survey of the abdomen for ascites demonstrates no significant ascites in the right upper quadrant Minimal ascites in the right  lower quadrant. Larger pockets of ascites are seen in the left lower quadrant and to a lesser degree in left upper quadrant, though substantially less than was present at the time of the prior exam.  IMPRESSION: Less ascites is present on the current study than was seen previously. At the time of previous imaging, only 2 liters of ascites could be aspirated and the current ascites volume appears to be much less. Discussed with Dr. Kerry Hough; will hold off on paracentesis today and reassess in several days in anticipation of performing paracentesis prior to discharge.   Original Report Authenticated By: Ulyses Southward, M.D.    I have reviewed the patient's current medications.  Assessment/Plan: Problem #1 end-stage renal disease is status post dialysis yesterday BUN is 21 and his creatinine is 3.6 and  potassium of 3.3. Presently he doesn't have any uremic sign and symptoms. Problem #2 anemia this is secondary to GI bleeding. His hemoglobin hematocrit is stable  today. Patient has been receiving blood transfusion. Problem #3 diabetes Problem #4 history of liver cirrhosis  Problem #5 metabolic bone disease his calcium and phosphorus is stable. Problem #6 noncompliance with dialysis Problem #7 trauma cytopenia Problem #8 hepatic encephalopathy. Patient is alert and answering questions appropriately. Plan: Will dialyze patient tommorow We'll continue his present management and we'll check his phosphorus and basic metabolic panel in the morning. Decrease Renvela to 800 mg po tid with meals   LOS: 4 days   Amiree No S 03/09/2013,7:40 AM

## 2013-03-10 DIAGNOSIS — K31819 Angiodysplasia of stomach and duodenum without bleeding: Secondary | ICD-10-CM

## 2013-03-10 LAB — BASIC METABOLIC PANEL
CO2: 30 mEq/L (ref 19–32)
Calcium: 8 mg/dL — ABNORMAL LOW (ref 8.4–10.5)
Chloride: 101 mEq/L (ref 96–112)
Creatinine, Ser: 4.96 mg/dL — ABNORMAL HIGH (ref 0.50–1.35)
Glucose, Bld: 108 mg/dL — ABNORMAL HIGH (ref 70–99)
Sodium: 136 mEq/L (ref 135–145)

## 2013-03-10 LAB — CBC
HCT: 27.5 % — ABNORMAL LOW (ref 39.0–52.0)
MCH: 30.9 pg (ref 26.0–34.0)
MCV: 92.3 fL (ref 78.0–100.0)
Platelets: 102 10*3/uL — ABNORMAL LOW (ref 150–400)
RBC: 2.98 MIL/uL — ABNORMAL LOW (ref 4.22–5.81)
WBC: 6.8 10*3/uL (ref 4.0–10.5)

## 2013-03-10 LAB — GLUCOSE, CAPILLARY
Glucose-Capillary: 109 mg/dL — ABNORMAL HIGH (ref 70–99)
Glucose-Capillary: 109 mg/dL — ABNORMAL HIGH (ref 70–99)

## 2013-03-10 MED ORDER — ALBUMIN HUMAN 25 % IV SOLN
50.0000 g | Freq: Once | INTRAVENOUS | Status: AC
  Administered 2013-03-10: 50 g via INTRAVENOUS
  Filled 2013-03-10: qty 200

## 2013-03-10 MED ORDER — ALTEPLASE 2 MG IJ SOLR: 2.0000 mg | Freq: Once | INTRAMUSCULAR | Status: AC | PRN

## 2013-03-10 MED ORDER — SODIUM CHLORIDE 0.9 % IV SOLN: 100.0000 mL | INTRAVENOUS | Status: DC | PRN

## 2013-03-10 MED ORDER — ALBUMIN HUMAN 25 % IV SOLN
12.5000 g | Freq: Once | INTRAVENOUS | Status: DC
  Filled 2013-03-10: qty 50

## 2013-03-10 NOTE — Plan of Care (Signed)
Problem: Phase I Progression Outcomes Goal: Voiding-avoid urinary catheter unless indicated Outcome: Completed/Met Date Met:  03/10/13 Hemodialysis patient, oliguric Goal: Hemodynamically stable Outcome: Progressing Blood pressure remains soft. 1 unit of PRBC administered tonight 03/09/13.  Patient tolerated well

## 2013-03-10 NOTE — Progress Notes (Signed)
Subjective: Interval History: has no complaint of nausea or vomiting. Presently patient claims to be feeling better. His appetite is good and he doesn't have any difficulty in  Breathing.Patient also denies any bleeding or dark stool  Objective: Vital signs in last 24 hours: Temp:  [98 F (36.7 C)-98.6 F (37 C)] 98.4 F (36.9 C) (08/30 0730) Pulse Rate:  [84-91] 86 (08/29 2105) Resp:  [7-22] 15 (08/30 0700) BP: (77-105)/(35-68) 94/39 mmHg (08/30 0700) SpO2:  [93 %-100 %] 99 % (08/30 0030) Weight:  [104.781 kg (231 lb)-106.8 kg (235 lb 7.2 oz)] 106.8 kg (235 lb 7.2 oz) (08/30 0600) Weight change: 1.081 kg (2 lb 6.1 oz)  Intake/Output from previous day: 08/29 0701 - 08/30 0700 In: 1090 [P.O.:240; I.V.:500; Blood:350] Out: -  Intake/Output this shift:    General appearance: alert, cooperative and no distress Resp: clear to auscultation bilaterally Cardio: regular rate and rhythm, S1, S2 normal, no murmur, click, rub or gallop GI: soft, non-tender; bowel sounds normal; no masses,  no organomegaly Extremities: extremities normal, atraumatic, no cyanosis or edema  Lab Results:  Recent Labs  03/09/13 0453 03/10/13 0538  WBC 6.3 6.8  HGB 8.4* 9.2*  HCT 24.4* 27.5*  PLT 99* 102*   BMET:   Recent Labs  03/09/13 0453 03/10/13 0538  NA 136 136  K 3.3* 3.6  CL 101 101  CO2 29 30  GLUCOSE 85 108*  BUN 21 27*  CREATININE 3.60* 4.96*  CALCIUM 7.8* 8.0*   No results found for this basename: PTH,  in the last 72 hours Iron Studies: No results found for this basename: IRON, TIBC, TRANSFERRIN, FERRITIN,  in the last 72 hours  Studies/Results: Dg Chest 1 View  03/09/2013   *RADIOLOGY REPORT*  Clinical Data: Central line placement  CHEST - 1 VIEW  Comparison: 02/26/2013  Findings: Left subclavian central venous catheter tip is within the right jugular vein directed cranially, tip above extent of chest radiograph. Enlargement of cardiac silhouette with pulmonary vascular  congestion. Bronchitic changes with small right pleural effusion and mild basilar atelectasis. Lungs otherwise clear. No pneumothorax. Bones demineralized.  IMPRESSION: No pneumothorax following central line placement. Left subclavian catheter however enters the right jugular vein with the tip directed cranially above the extent of chest radiograph exam; recommend repositioning. COPD changes with right basilar atelectasis and small right pleural effusion.   Original Report Authenticated By: Ulyses Southward, M.D.   US Paracentesis  03/09/2013   *RADIOLOGY REPORT*  ULTRASOUND GUIDED PARACENTESIS:  Clinical Data:  Cirrhosis, ascites  Technique: After explanation of procedure, benefits, and risks, written informed consent was obtained. Time-out protocol was followed. Collection of ascites in left lower quadrant localized by ultrasound. Skin prepped and draped in usual sterile fashion. Skin and soft tissues anesthestized with 9 ml of 1% lidocaine. 5-French Yueh catheter placed into peritoneal cavity. 1080 ml of yellow fluid aspirated by vacuum bottle suction. Procedure tolerated well by patient without immediate complication.  IMPRESSION: Ultrasound-guided paracentesis of 1080 ml of ascitic fluid. Fluid sent to laboratory for requested analysis.   Original Report Authenticated By: Ulyses Southward, M.D.    I have reviewed the patient's current medications.  Assessment/Plan: Problem #1 end-stage renal disease is status post dialysis yesterday BUN is 27 and his creatinine is 4.96  and  potassium of 3.6. Presently he doesn't have any uremic sign and symptoms. Problem #2 anemia this is secondary to GI bleeding. His hemoglobin hematocrit is stable  today. Patient has been receiving blood transfusion. Problem #  3 diabetes Problem #4 history of liver cirrhosis  Problem #5 metabolic bone disease his calcium and phosphorus is stable. Problem #6 noncompliance with dialysis Problem #7 trauma cytopenia Problem #8 hepatic  encephalopathy. Patient is alert and answering questions appropriately. Problem #9 hypotension  Problem #10 CHF patient with increasing edema Problem #11 lack of access for medication ,specailly with his reccurent admission and the need for repeated blood transfusion . Patient may require mediport placement Plan: Will dialyze patient today We'll use albumin and low dialysate temprature and possibly UFR about 2 to 3 liters if sbp >90    LOS: 5 days   Timothy Chan S 03/10/2013,8:33 AM

## 2013-03-10 NOTE — Progress Notes (Signed)
[facility] PROGRESS NOTE  Timothy Chan ZOX:[id number] DOB: [birth year] DOA: 03/05/2013 PCP: Ignatius Specking., MD  Assessment/Plan: Severe anemia, normocytic: Acute on chronic. Acute blood loss anemia secondary to GI bleed. Status post 5 units packed red blood cells. Results of EGD as below.  Patient is on protonix and carafate.  Hemoglobin appears to be stable  Abdominal pain: Significance unclear. Appears resolved. Exam benign.   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   End-stage renal disease: Management per nephrology. Fluid removal has been difficult due to hypotension. Discussed with Dr. Kristian Covey and plans will be to try albumin infusion today  Alcoholic liver disease: Complicated by esophageal varices, GAVE and recurrent ascites. History of TIPS procedure. Per chart review he has been evaluated at [facility] in the past and is not felt to be a candidate for transplant.  Diabetes mellitus: Capillary blood sugars stable.   Noncompliance with hemodialysis, history of leaving AMA.  IV access. Patient had central line placed by general surgery yesterday.  Patient is being considered for portacath placement.  Code Status: full code Family Communication: no family present Disposition Plan: discharge back to [facility] when stable   Consultants: GI, Dr. Karilyn Cota Nephrology, Dr. Kristian Covey General Surgery, Dr. Leticia Penna  Procedures:  Central line placement, right subclavian 8/29, Dr. Leticia Penna  Endoscopy 8/27: Limited evaluation of upper GI tract secondary to large food debris and some coffee-ground material. Slow bleed or wheezing noted from telangiectasia at gastric antrum.he was treated with argon plasma coagulator with hemostasis another site with oozing and prepyloric area could not be seen because of food debris.  Endoscopy 8/29: Portal gastropathy. 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 Bleeding controlled with argon plasma  coagulation  Paracentesis 8/29 with removal of of fluid  Regular hemodialysis  Antibiotics:  Ciprofloxacin daily for sbp prophylaxis  HPI/Subjective: One stool last night, and one this morning, both black in color. No vomiting. Complaining of pain in legs.  Objective: Filed Vitals:   03/10/13 0730  BP:   Pulse:   Temp: 98.4 F (36.9 C)  Resp:     Intake/Output Summary (Last 24 hours) at 03/10/13 0821 Last data filed at 03/09/13 2105  Gross per 24 hour  Intake   1090 ml  Output      0 ml  Net   1090 ml   Filed Weights   03/09/13 0520 03/09/13 1413 03/10/13 0600  Weight: 104.9 kg (231 lb 4.2 oz) 104.781 kg (231 lb) 106.8 kg (235 lb 7.2 oz)    Exam:   General:  NAD  Cardiovascular: S1, S2 rrr  Respiratory: crackles at bases  Abdomen: soft, distended, tender in periumbilical region, positive bowel sounds  Musculoskeletal: 2-3+ edema in LE bilaterally   Data Reviewed: Basic Metabolic Panel:  Recent Labs Lab 03/06/13 0551 03/07/13 0447 03/08/13 0625 03/09/13 0453 03/10/13 0538  NA 130* 135 133* 136 136  K 3.7 3.8 4.1 3.3* 3.6  CL 94* 101 98 101 101  CO2 27 30 27 29 30   GLUCOSE 190* 130* 115* 85 108*  BUN 35* 23 34* 21 27*  CREATININE 6.38* 4.63* 5.36* 3.60* 4.96*  CALCIUM 8.5 7.9* 8.4 7.8* 8.0*  PHOS  --  2.8  --  2.3  --    Liver Function Tests:  Recent Labs Lab 03/06/13 0551  AST 28  ALT 14  ALKPHOS 122*  BILITOT 1.2  PROT 5.8*  ALBUMIN 1.3*   No results found for this basename: LIPASE,  AMYLASE,  in the last 168 hours No results found for this basename: AMMONIA,  in the last 168 hours CBC:  Recent Labs Lab 03/05/13 1735 03/06/13 0551 03/07/13 0447 03/08/13 0625 03/09/13 0453 03/10/13 0538  WBC 8.9 8.3 7.1 8.5 6.3 6.8  NEUTROABS 5.0  --   --   --   --   --   HGB 6.9* 9.1* 7.0* 8.4* 8.4* 9.2*  HCT 21.3* 27.5* 21.0* 24.3* 24.4* 27.5*  MCV 96.4 93.5 93.3 92.4 91.4 92.3  PLT 181 170 117* 121* 99* 102*   Cardiac  Enzymes: No results found for this basename: CKTOTAL, CKMB, CKMBINDEX, TROPONINI,  in the last 168 hours BNP (last 3 results)  Recent Labs  11/21/12 1145  PROBNP 1167.0*   CBG:  Recent Labs Lab 03/09/13 0506 03/09/13 1751 03/09/13 2027 03/10/13 0028 03/10/13 0528  GLUCAP 80 80 93 109* 109*    Recent Results (from the past 240 hour(s))  CULTURE, BODY FLUID-BOTTLE     Status: None   Collection Time    03/09/13  2:07 PM      Result Value Range Status   Specimen Description ASCITIC   Final   Special Requests NONE   Final   Culture NO GROWTH 1 DAY   Final   Report Status PENDING   Incomplete  GRAM STAIN     Status: None   Collection Time    03/09/13  2:12 PM      Result Value Range Status   Specimen Description ASCITIC   Final   Special Requests NONE   Final   Gram Stain     Final   Value: NO ORGANISMS SEEN     FEW WBC SEEN   Report Status 03/09/2013 FINAL   Final     Studies: Dg Chest 1 View  03/09/2013   *RADIOLOGY REPORT*  Clinical Data: Central line placement  CHEST - 1 VIEW  Comparison: 02/26/2013  Findings: Left subclavian central venous catheter tip is within the right jugular vein directed cranially, tip above extent of chest radiograph. Enlargement of cardiac silhouette with pulmonary vascular congestion. Bronchitic changes with small right pleural effusion and mild basilar atelectasis. Lungs otherwise clear. No pneumothorax. Bones demineralized.  IMPRESSION: No pneumothorax following central line placement. Left subclavian catheter however enters the right jugular vein with the tip directed cranially above the extent of chest radiograph exam; recommend repositioning. COPD changes with right basilar atelectasis and small right pleural effusion.   Original Report Authenticated By: Ulyses Southward, M.D.   US Paracentesis  03/09/2013   *RADIOLOGY REPORT*  ULTRASOUND GUIDED PARACENTESIS:  Clinical Data:  Cirrhosis, ascites  Technique: After explanation of procedure,  benefits, and risks, written informed consent was obtained. Time-out protocol was followed. Collection of ascites in left lower quadrant localized by ultrasound. Skin prepped and draped in usual sterile fashion. Skin and soft tissues anesthestized with 9 ml of 1% lidocaine. 5-French Yueh catheter placed into peritoneal cavity. 1080 ml of yellow fluid aspirated by vacuum bottle suction. Procedure tolerated well by patient without immediate complication.  IMPRESSION: Ultrasound-guided paracentesis of 1080 ml of ascitic fluid. Fluid sent to laboratory for requested analysis.   Original Report Authenticated By: Ulyses Southward, M.D.    Scheduled Meds: . antiseptic oral rinse  15 mL Mouth Rinse q12n4p  . ciprofloxacin  500 mg Oral Daily  . folic acid  1 mg Oral QHS  . furosemide  80 mg Oral BID  . hydrocortisone valerate ointment  1 application Topical Q  M,W,F  . insulin aspart  0-9 Units Subcutaneous Q6H  . metoCLOPramide  5 mg Oral TID AC & HS  . pantoprazole (PROTONIX) IV  40 mg Intravenous Q24H  . sevelamer carbonate  800 mg Oral TID WC  . sodium chloride  10 mL Intravenous Q12H  . sucralfate  1 g Oral QID   Continuous Infusions:    Principal Problem:   Acute blood loss anemia Active Problems:   Alcoholic cirrhosis/remote TIPS   Diabetes mellitus type 2, uncontrolled, with complications   GI bleed   Abdominal pain   Thrombocytopenia, unspecified   GAVE (gastric antral vascular ectasia)   ESRD on dialysis   Protein-calorie malnutrition, severe    Time spent:    [facility]  [facility] Pager [phone]. If 7PM-7AM, please contact night-coverage at [email or web address], password [facility] 03/10/2013, 8:21 AM  LOS: 5 days

## 2013-03-10 NOTE — Progress Notes (Signed)
Subjective. Patient denies abdominal pain or nausea. He complains of bilateral leg pain. He has had 3 bowel movements since EGD yesterday afternoon and stool is still black. Objective; BP 97/44  Pulse 111  Temp(Src) 98 F (36.7 C) (Oral)  Resp 18  Ht 6\' 3"  (1.905 m)  Wt 235 lb 7.2 oz (106.8 kg)  BMI 29.43 kg/m2  SpO2 96% Patient is awake and alert. He is undergoing hemodialysis. Abdomen is fold but nontender. He has bilateral lower extremity edema. Lab data; WBC 6.8, H&H 9.2 and 27.5 and platelet count 102K. Assessment; Recurrent upper GI bleed secondary to GAVE. Status post EGD 3 days ago and yesterday. Hemoglobin is fine the above 9 g. Patient at increased risk for rebleed because of advanced cirrhosis, poor tissue healing. He is receiving albumin with his dialysis. Long-term prognosis very poor. As indicated in earlier notes patient on [place] candidate for hepatorenal transplant.

## 2013-03-10 NOTE — Procedures (Signed)
   HEMODIALYSIS TREATMENT NOTE:  3.5 hour heparin-free dialysis completed through left forearm AVF (15g/antegrade).  Kept even / no fluid removed due to hypotension, despite Albumin 50g IV at initiation of treatment.  All blood was reinfused.  Hemostasis was achieved within 10 minutes.  Report given to Thurnell Lose, RN.  Tal Neer L. Kaitlynne Wenz, RN, CDN

## 2013-03-11 LAB — CBC
HCT: 24.3 % — ABNORMAL LOW (ref 39.0–52.0)
Hemoglobin: 7.9 g/dL — ABNORMAL LOW (ref 13.0–17.0)
MCH: 30.4 pg (ref 26.0–34.0)
MCHC: 32.5 g/dL (ref 30.0–36.0)
MCV: 93.5 fL (ref 78.0–100.0)

## 2013-03-11 LAB — GLUCOSE, CAPILLARY: Glucose-Capillary: 120 mg/dL — ABNORMAL HIGH (ref 70–99)

## 2013-03-11 LAB — BASIC METABOLIC PANEL
BUN: 22 mg/dL (ref 6–23)
Calcium: 8.1 mg/dL — ABNORMAL LOW (ref 8.4–10.5)
GFR calc non Af Amer: 15 mL/min — ABNORMAL LOW (ref >90)
Glucose, Bld: 132 mg/dL — ABNORMAL HIGH (ref 70–99)

## 2013-03-11 NOTE — Progress Notes (Signed)
[facility] PROGRESS NOTE  WESSON STITH ZOX:[id number] DOB: [birth year] DOA: 03/05/2013 PCP: Ignatius Specking., MD  Assessment/Plan: Severe anemia, normocytic: Acute on chronic. Acute blood loss anemia secondary to GI bleed. Status post 5 units packed red blood cells. Results of EGD as below.  Patient is on protonix and carafate. Hemoglobin has declined again today.  Will repeat H/H this afternoon.  Will likely need more prbc transfusions during dialysis tomorrow.  Abdominal pain: Significance unclear. Appears resolved. Exam benign.   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   End-stage renal disease: Management per nephrology. Unable to remove fluid during dialysis yesterday.  Next treatment is tomorrow.  Consider using vasopressors for hypotension.  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 History of TIPS procedure. Per chart review he has been evaluated at [facility] in the past and is not felt to be a candidate for transplant.  Diabetes mellitus: Capillary blood sugars stable.   Noncompliance with hemodialysis, history of leaving AMA.  IV access. Patient had central line placed by general surgery.  Patient is being considered for portacath placement.  Discussion:  I had an honest discussion with the patient, explaining his severe comorbidities and poor prognosis.  I informed him that at this time, he is not a candidate for hepatorenal transplant. His poor functional/nutritional status are both markers for poor longterm outcome. I briefly brought up the possibility of pursuing a hospice approach, explaining that he would need to stop dialysis. The patient wishes some time to think over these options and wishes to discuss this further with his physicians/family.  Code Status: full code Family Communication: no family present Disposition Plan: discharge back to [facility] when stable, transfer telemetry   Consultants: GI, Dr.  Karilyn Cota Nephrology, Dr. Kristian Covey General Surgery, Dr. Leticia Penna  Procedures:  Central line placement, right subclavian 8/29, Dr. Leticia Penna  Endoscopy 8/27: Limited evaluation of upper GI tract secondary to large food debris and some coffee-ground material. Slow bleed or wheezing noted from telangiectasia at gastric antrum.he was treated with argon plasma coagulator with hemostasis another site with oozing and prepyloric area could not be seen because of food debris.  Endoscopy 8/29: Portal gastropathy. 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 Bleeding controlled with argon plasma coagulation  Paracentesis 8/29 with removal of of fluid  Regular hemodialysis  Antibiotics:  Ciprofloxacin daily for sbp prophylaxis  HPI/Subjective: Had 3 bowel movements yesterday that were black.  No new complaints today.  Feels sleepy.  Objective: Filed Vitals:   03/11/13 0724  BP:   Pulse:   Temp: 99 F (37.2 C)  Resp:     Intake/Output Summary (Last 24 hours) at 03/11/13 0913 Last data filed at 03/10/13 1300  Gross per 24 hour  Intake    480 ml  Output    264 ml  Net    216 ml   Filed Weights   03/10/13 0600 03/10/13 0845 03/11/13 0500  Weight: 106.8 kg (235 lb 7.2 oz) 106.8 kg (235 lb 7.2 oz) 109.1 kg (240 lb 8.4 oz)    Exam:   General:  NAD, awake and alert, no asterixis  Cardiovascular: S1, S2 rrr  Respiratory: crackles at bases  Abdomen: soft, distended, tender in periumbilical region, positive bowel sounds  Musculoskeletal: 2-3+ edema in LE bilaterally   Data Reviewed: Basic Metabolic Panel:  Recent Labs Lab 03/07/13 0447 03/08/13 0625 03/09/13 0453 03/10/13 0538 03/11/13 0522  NA 135 133* 136 136 131*  K 3.8 4.1 3.3* 3.6 3.6  CL 101 98 101 101 97  CO2 30 27 29 30 31   GLUCOSE 130* 115* 85 108* 132*  BUN 23 34* 21 27* 22  CREATININE 4.63* 5.36* 3.60* 4.96* 3.86*  CALCIUM 7.9* 8.4 7.8* 8.0* 8.1*  PHOS 2.8  --  2.3  --   --     Liver Function Tests:  Recent Labs Lab 03/06/13 0551  AST 28  ALT 14  ALKPHOS 122*  BILITOT 1.2  PROT 5.8*  ALBUMIN 1.3*   No results found for this basename: LIPASE, AMYLASE,  in the last 168 hours No results found for this basename: AMMONIA,  in the last 168 hours CBC:  Recent Labs Lab 03/05/13 1735  03/07/13 0447 03/08/13 0625 03/09/13 0453 03/10/13 0538 03/11/13 0522  WBC 8.9  < > 7.1 8.5 6.3 6.8 5.1  NEUTROABS 5.0  --   --   --   --   --   --   HGB 6.9*  < > 7.0* 8.4* 8.4* 9.2* 7.9*  HCT 21.3*  < > 21.0* 24.3* 24.4* 27.5* 24.3*  MCV 96.4  < > 93.3 92.4 91.4 92.3 93.5  PLT 181  < > 117* 121* 99* 102* 84*  < > = values in this interval not displayed. Cardiac Enzymes: No results found for this basename: CKTOTAL, CKMB, CKMBINDEX, TROPONINI,  in the last 168 hours BNP (last 3 results)  Recent Labs  11/21/12 1145  PROBNP 1167.0*   CBG:  Recent Labs Lab 03/10/13 0528 03/10/13 1142 03/10/13 1740 03/10/13 2357 03/11/13 0558  GLUCAP 109* 137* 155* 131* 120*    Recent Results (from the past 240 hour(s))  CULTURE, BODY FLUID-BOTTLE     Status: None   Collection Time    03/09/13  2:07 PM      Result Value Range Status   Specimen Description ASCITIC   Final   Special Requests NONE   Final   Culture NO GROWTH 2 DAYS   Final   Report Status PENDING   Incomplete  GRAM STAIN     Status: None   Collection Time    03/09/13  2:12 PM      Result Value Range Status   Specimen Description ASCITIC   Final   Special Requests NONE   Final   Gram Stain     Final   Value: NO ORGANISMS SEEN     FEW WBC SEEN   Report Status 03/09/2013 FINAL   Final     Studies: Dg Chest 1 View  03/09/2013   *RADIOLOGY REPORT*  Clinical Data: Central line placement  CHEST - 1 VIEW  Comparison: 02/26/2013  Findings: Left subclavian central venous catheter tip is within the right jugular vein directed cranially, tip above extent of chest radiograph. Enlargement of cardiac silhouette  with pulmonary vascular congestion. Bronchitic changes with small right pleural effusion and mild basilar atelectasis. Lungs otherwise clear. No pneumothorax. Bones demineralized.  IMPRESSION: No pneumothorax following central line placement. Left subclavian catheter however enters the right jugular vein with the tip directed cranially above the extent of chest radiograph exam; recommend repositioning. COPD changes with right basilar atelectasis and small right pleural effusion.   Original Report Authenticated By: Ulyses Southward, M.D.   US Paracentesis  03/09/2013   *RADIOLOGY REPORT*  ULTRASOUND GUIDED PARACENTESIS:  Clinical Data:  Cirrhosis, ascites  Technique: After explanation of procedure, benefits, and risks, written informed consent was obtained. Time-out protocol was followed. Collection of ascites in  left lower quadrant localized by ultrasound. Skin prepped and draped in usual sterile fashion. Skin and soft tissues anesthestized with 9 ml of 1% lidocaine. 5-French Yueh catheter placed into peritoneal cavity. 1080 ml of yellow fluid aspirated by vacuum bottle suction. Procedure tolerated well by patient without immediate complication.  IMPRESSION: Ultrasound-guided paracentesis of 1080 ml of ascitic fluid. Fluid sent to laboratory for requested analysis.   Original Report Authenticated By: Ulyses Southward, M.D.    Scheduled Meds: . antiseptic oral rinse  15 mL Mouth Rinse q12n4p  . ciprofloxacin  500 mg Oral Daily  . folic acid  1 mg Oral QHS  . furosemide  80 mg Oral BID  . hydrocortisone valerate ointment  1 application Topical Q M,W,F  . insulin aspart  0-9 Units Subcutaneous Q6H  . metoCLOPramide  5 mg Oral TID AC & HS  . pantoprazole (PROTONIX) IV  40 mg Intravenous Q24H  . sevelamer carbonate  800 mg Oral TID WC  . sodium chloride  10 mL Intravenous Q12H  . sucralfate  1 g Oral QID   Continuous Infusions:    Principal Problem:   Acute blood loss anemia Active Problems:   Alcoholic  cirrhosis/remote TIPS   Diabetes mellitus type 2, uncontrolled, with complications   GI bleed   Abdominal pain   Thrombocytopenia, unspecified   GAVE (gastric antral vascular ectasia)   ESRD on dialysis   Protein-calorie malnutrition, severe    Time spent:    [facility]  [facility] Pager [phone]. If 7PM-7AM, please contact night-coverage at [email or web address], password [facility] 03/11/2013, 9:13 AM  LOS: 6 days

## 2013-03-11 NOTE — Progress Notes (Signed)
Subjective: Interval History: has no complaint of nausea or vomiting. Presently patient claims not feeling better. Has some abdominal pain  Objective: Vital signs in last 24 hours: Temp:  [98 F (36.7 C)-99.9 F (37.7 C)] 99 F (37.2 C) (08/31 0724) Pulse Rate:  [102-115] 110 (08/30 1245) Resp:  [9-20] 18 (08/31 0230) BP: (81-113)/(38-79) 93/49 mmHg (08/31 0230) SpO2:  [95 %-99 %] 99 % (08/31 0230) Weight:  [109.1 kg (240 lb 8.4 oz)] 109.1 kg (240 lb 8.4 oz) (08/31 0500) Weight change: 2.019 kg (4 lb 7.2 oz)  Intake/Output from previous day: 08/30 0701 - 08/31 0700 In: 480 [P.O.:480] Out: 264  Intake/Output this shift:    Generally he is alert and in no apparent distress. Chest decreased breath sound bilaterally. Abdomen is full and nontender positive bowel sounds. Extremities has 2+ edema bilaterally.  Lab Results:  Recent Labs  03/10/13 0538 03/11/13 0522  WBC 6.8 5.1  HGB 9.2* 7.9*  HCT 27.5* 24.3*  PLT 102* 84*   BMET:   Recent Labs  03/10/13 0538 03/11/13 0522  NA 136 131*  K 3.6 3.6  CL 101 97  CO2 30 31  GLUCOSE 108* 132*  BUN 27* 22  CREATININE 4.96* 3.86*  CALCIUM 8.0* 8.1*   No results found for this basename: PTH,  in the last 72 hours Iron Studies: No results found for this basename: IRON, TIBC, TRANSFERRIN, FERRITIN,  in the last 72 hours  Studies/Results: Dg Chest 1 View  03/09/2013   *RADIOLOGY REPORT*  Clinical Data: Central line placement  CHEST - 1 VIEW  Comparison: 02/26/2013  Findings: Left subclavian central venous catheter tip is within the right jugular vein directed cranially, tip above extent of chest radiograph. Enlargement of cardiac silhouette with pulmonary vascular congestion. Bronchitic changes with small right pleural effusion and mild basilar atelectasis. Lungs otherwise clear. No pneumothorax. Bones demineralized.  IMPRESSION: No pneumothorax following central line placement. Left subclavian catheter however enters the right  jugular vein with the tip directed cranially above the extent of chest radiograph exam; recommend repositioning. COPD changes with right basilar atelectasis and small right pleural effusion.   Original Report Authenticated By: Ulyses Southward, M.D.   US Paracentesis  03/09/2013   *RADIOLOGY REPORT*  ULTRASOUND GUIDED PARACENTESIS:  Clinical Data:  Cirrhosis, ascites  Technique: After explanation of procedure, benefits, and risks, written informed consent was obtained. Time-out protocol was followed. Collection of ascites in left lower quadrant localized by ultrasound. Skin prepped and draped in usual sterile fashion. Skin and soft tissues anesthestized with 9 ml of 1% lidocaine. 5-French Yueh catheter placed into peritoneal cavity. 1080 ml of yellow fluid aspirated by vacuum bottle suction. Procedure tolerated well by patient without immediate complication.  IMPRESSION: Ultrasound-guided paracentesis of 1080 ml of ascitic fluid. Fluid sent to laboratory for requested analysis.   Original Report Authenticated By: Ulyses Southward, M.D.    I have reviewed the patient's current medications.  Assessment/Plan: Problem #1 end-stage renal disease is status post dialysis yesterday BUN is 22 and his creatinine is 3.86  and  potassium of 3.6. Presently he doesn't have any uremic sign and symptoms. Problem #2 anemia this is secondary to GI bleeding. His hemoglobin hematocrit has decreased today presently patient seems to be requiring repeated blood transfusion. Problem #3 diabetes Problem #4 history of liver cirrhosis  Problem #5 metabolic bone disease his calcium and phosphorus is stable. Problem #6 noncompliance with dialysis Problem #7 trauma cytopenia Problem #8 hepatic encephalopathy. Patient is alert and answering  questions appropriately. Problem #9 hypotension  Problem #10 CHF patient with increasing edema. Attempt to ultrafiltrate using albumin was unsuccessful yesterday. Still patient has edema but does not  have any difficulty breathing. Problem #11 lack of access for medication ,specailly with his reccurent admission and the need for repeated blood transfusion . Patient may require mediport placement Plan: We'll check his basic metabolic panel and CBC in the morning We'll try to dialyze him again tomorrow and possibly give him blood transfusion. Patient is becoming more unstable however patient wants everything to be done. His prognosis as this moment seems to be poor.    LOS: 6 days   Shonte Beutler S 03/11/2013,8:48 AM

## 2013-03-11 NOTE — Progress Notes (Signed)
Texted Dr. Sharl Ma the patients HGB level 7.7.  Tandra handy, Rn information left for him to return call if needed.

## 2013-03-11 NOTE — Progress Notes (Signed)
Subjective; Patient complains of scrotal edema. No bowel movements reported today. He remains a very good appetite. He ate all of his breakfast and lunch. He has fleeting upper abdominal pain.  Objective; BP 118/65  Pulse 100  Temp(Src) 98.6 F (37 C) (Oral)  Resp 16  Ht 6\' 3"  (1.905 m)  Wt 240 lb 8.4 oz (109.1 kg)  BMI 30.06 kg/m2  SpO2 99% Patient is alert and asterixis is absent. Abdomen is full but soft and nontender with few areas of ecchymosis and lower abdomen. He has scrotal edema. LE edema is unchanged.  Lab data; H&H 7.9 and 24.3 Platelet count 84K  Assessment; Recurrent upper GI bleed secondary to GAVE. He has undergone two therapeutic EGD is during this admission. Hemoglobin has dropped again by more than 1 g in the last 24 hours but no evidence of overt bleed. 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 He has not had any alcohol in 14 years. His cirrhosis is complicated by thrombocytopenia ascites and hepatic encephalopathy. Patient's prognosis is very poor and he is not candidate for hepatorenal transplant. Dr. Kerry Hough did discuss hospice care with patient at this point he is reluctant.  Recommendations; Will continue PPI and sucralfate. H&H in a.m.

## 2013-03-11 NOTE — Progress Notes (Signed)
Patient transferred to room 338. Report given to Zannie Kehr RN. Vital signs stable at transfer.

## 2013-03-12 DIAGNOSIS — K922 Gastrointestinal hemorrhage, unspecified: Secondary | ICD-10-CM

## 2013-03-12 LAB — BASIC METABOLIC PANEL
CO2: 30 mEq/L (ref 19–32)
Calcium: 8.4 mg/dL (ref 8.4–10.5)
Chloride: 99 mEq/L (ref 96–112)
Potassium: 3.8 mEq/L (ref 3.5–5.1)
Sodium: 133 mEq/L — ABNORMAL LOW (ref 135–145)

## 2013-03-12 LAB — PHOSPHORUS: Phosphorus: 2.3 mg/dL (ref 2.3–4.6)

## 2013-03-12 LAB — CBC
Hemoglobin: 7.2 g/dL — ABNORMAL LOW (ref 13.0–17.0)
MCV: 92.8 fL (ref 78.0–100.0)
Platelets: 84 10*3/uL — ABNORMAL LOW (ref 150–400)
RBC: 2.36 MIL/uL — ABNORMAL LOW (ref 4.22–5.81)
WBC: 5.6 10*3/uL (ref 4.0–10.5)

## 2013-03-12 LAB — GLUCOSE, CAPILLARY: Glucose-Capillary: 128 mg/dL — ABNORMAL HIGH (ref 70–99)

## 2013-03-12 MED ORDER — MUSCLE RUB 10-15 % EX CREA
TOPICAL_CREAM | Freq: Two times a day (BID) | CUTANEOUS | Status: DC | PRN
  Administered 2013-03-14 – 2013-03-15 (×3): via TOPICAL
  Filled 2013-03-12: qty 85

## 2013-03-12 MED ORDER — SODIUM CHLORIDE 0.9 % IV SOLN: 100.0000 mL | INTRAVENOUS | Status: DC | PRN

## 2013-03-12 MED ORDER — ALBUMIN HUMAN 25 % IV SOLN
50.0000 g | Freq: Once | INTRAVENOUS | Status: AC
  Administered 2013-03-12: 50 g via INTRAVENOUS
  Filled 2013-03-12: qty 200

## 2013-03-12 MED ORDER — ALTEPLASE 2 MG IJ SOLR
2.0000 mg | Freq: Once | INTRAMUSCULAR | Status: DC | PRN
  Filled 2013-03-12: qty 2

## 2013-03-12 NOTE — Progress Notes (Signed)
[facility] PROGRESS NOTE  Timothy Chan NWG:[id number] DOB: 1950/08/19 DOA: 03/05/2013 PCP: Ignatius Specking., MD  Assessment/Plan: Severe anemia, normocytic: Acute on chronic. Acute blood loss anemia secondary to GI bleed. Status post 5 units packed red blood cells. Results of EGD as below.  Patient is on protonix and carafate. Hemoglobin has declined again today to 6.3. He'll be transfused 2 units of PRBCs today.  Abdominal pain: Significance unclear. Appears resolved. Exam benign.   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   End-stage renal disease: Management per nephrology. Fluid removal has been challenging during dialysis due to hypotension. Patient to receive albumin again.  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 History of TIPS procedure. Per chart review he has been evaluated at [facility] in the past and is not felt to be a candidate for transplant.  Diabetes mellitus: Capillary blood sugars stable.   Noncompliance with hemodialysis, history of leaving AMA.  IV access. Patient had central line placed by general surgery.  Patient is being considered for portacath placement.  Discussion:  Another extensive conversation with the patient regarding his wishes for pursuing further aggressive care versus hospice care. He requested that I speak to his primary caregiver/power of attorney [name]. I spoke to [place] over the phone in the presence of the patient in the room. I explained the patient's advanced illnesses and poor prognosis. I reiterated that the patient will likely not improve without hepatorenal transplant, for which she is not a candidate for. [name] verbalized understanding and will help arrange a family meeting for tomorrow to further discuss these issues. She has requested that if Dr. Karilyn Cota may be present for this meeting. I will try and coordinate with Dr. Karilyn Cota to see if he is available.  Code Status: full  code Family Communication: no family present Disposition Plan: discharge back to [facility] when stable   Consultants: GI, Dr. Karilyn Cota Nephrology, Dr. Kristian Covey General Surgery, Dr. Leticia Penna  Procedures:  Central line placement, right subclavian 8/29, Dr. Leticia Penna  Endoscopy 8/27: Limited evaluation of upper GI tract secondary to large food debris and some coffee-ground material. Slow bleed or wheezing noted from telangiectasia at gastric antrum.he was treated with argon plasma coagulator with hemostasis another site with oozing and prepyloric area could not be seen because of food debris.  Endoscopy 8/29: Portal gastropathy. 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 Bleeding controlled with argon plasma coagulation  Paracentesis 8/29 with removal of of fluid  Regular hemodialysis  Antibiotics:  Ciprofloxacin daily for sbp prophylaxis  HPI/Subjective: No bowel movements. No visible signs of bleeding.  Objective: Filed Vitals:   03/12/13 1402  BP: 96/45  Pulse: 108  Temp:   Resp: 17    Intake/Output Summary (Last 24 hours) at 03/12/13 2009 Last data filed at 03/12/13 1118  Gross per 24 hour  Intake    360 ml  Output    224 ml  Net    136 ml   Filed Weights   03/11/13 0500 03/12/13 0409 03/12/13 0730  Weight: 109.1 kg (240 lb 8.4 oz) 108.6 kg (239 lb 6.7 oz) 103.6 kg (228 lb 6.3 oz)    Exam:   General:  NAD, awake and alert, no asterixis  Cardiovascular: S1, S2 rrr  Respiratory: crackles at bases  Abdomen: soft, distended, tender in periumbilical region, positive bowel sounds  Musculoskeletal: 2-3+ edema in the extremities bilaterally   Data Reviewed: Basic Metabolic Panel:  Recent Labs Lab  03/07/13 0447 03/08/13 1610 03/09/13 0453 03/10/13 0538 03/11/13 0522 03/12/13 0515  NA 135 133* 136 136 131* 133*  K 3.8 4.1 3.3* 3.6 3.6 3.8  CL 101 98 101 101 97 99  CO2 30 27 29 30 31 30   GLUCOSE 130* 115* 85 108*  132* 175*  BUN 23 34* 21 27* 22 31*  CREATININE 4.63* 5.36* 3.60* 4.96* 3.86* 4.72*  CALCIUM 7.9* 8.4 7.8* 8.0* 8.1* 8.4  PHOS 2.8  --  2.3  --   --  2.3   Liver Function Tests:  Recent Labs Lab 03/06/13 0551  AST 28  ALT 14  ALKPHOS 122*  BILITOT 1.2  PROT 5.8*  ALBUMIN 1.3*   No results found for this basename: LIPASE, AMYLASE,  in the last 168 hours No results found for this basename: AMMONIA,  in the last 168 hours CBC:  Recent Labs Lab 03/08/13 0625 03/09/13 0453 03/10/13 0538 03/11/13 0522 03/11/13 1423 03/12/13 0515 03/12/13 1745  WBC 8.5 6.3 6.8 5.1  --  5.6  --   HGB 8.4* 8.4* 9.2* 7.9* 7.7* 7.2* 6.3*  HCT 24.3* 24.4* 27.5* 24.3* 22.9* 21.9* 19.3*  MCV 92.4 91.4 92.3 93.5  --  92.8  --   PLT 121* 99* 102* 84*  --  84*  --    Cardiac Enzymes: No results found for this basename: CKTOTAL, CKMB, CKMBINDEX, TROPONINI,  in the last 168 hours BNP (last 3 results)  Recent Labs  11/21/12 1145  PROBNP 1167.0*   CBG:  Recent Labs Lab 03/11/13 1134 03/11/13 1740 03/11/13 2313 03/12/13 0518 03/12/13 1703  GLUCAP 149* 138* 158* 162* 128*    Recent Results (from the past 240 hour(s))  CULTURE, BODY FLUID-BOTTLE     Status: None   Collection Time    03/09/13  2:07 PM      Result Value Range Status   Specimen Description ASCITIC   Final   Special Requests NONE   Final   Culture NO GROWTH 3 DAYS   Final   Report Status PENDING   Incomplete  GRAM STAIN     Status: None   Collection Time    03/09/13  2:12 PM      Result Value Range Status   Specimen Description ASCITIC   Final   Special Requests NONE   Final   Gram Stain     Final   Value: NO ORGANISMS SEEN     FEW WBC SEEN   Report Status 03/09/2013 FINAL   Final     Studies: No results found.  Scheduled Meds: . antiseptic oral rinse  15 mL Mouth Rinse q12n4p  . ciprofloxacin  500 mg Oral Daily  . folic acid  1 mg Oral QHS  . furosemide  80 mg Oral BID  . hydrocortisone valerate ointment  1  application Topical Q M,W,F  . insulin aspart  0-9 Units Subcutaneous Q6H  . metoCLOPramide  5 mg Oral TID AC & HS  . pantoprazole (PROTONIX) IV  40 mg Intravenous Q24H  . sevelamer carbonate  800 mg Oral TID WC  . sodium chloride  10 mL Intravenous Q12H  . sucralfate  1 g Oral QID   Continuous Infusions:    Principal Problem:   Acute blood loss anemia Active Problems:   Alcoholic cirrhosis/remote TIPS   Diabetes mellitus type 2, uncontrolled, with complications   GI bleed   Abdominal pain   Thrombocytopenia, unspecified   GAVE (gastric antral vascular ectasia)   ESRD  on dialysis   Protein-calorie malnutrition, severe    Time spent:    [facility]  [facility] Pager [phone]. If 7PM-7AM, please contact night-coverage at [email or web address], password [facility] 03/12/2013, 8:09 PM  LOS: 7 days

## 2013-03-12 NOTE — Progress Notes (Signed)
Subjective: Patient seen in hemodialysis. Voices no complaints. Denies rectal bleeding hematemesis or melena. Tolerating basically a regular diet  Discussed with Dr. Karilyn Cota earlier today  Objective: Vital signs in last 24 hours: Temp:  [98.3 F (36.8 C)-98.6 F (37 C)] 98.4 F (36.9 C) (09/01 0730) Pulse Rate:  [100-120] 119 (09/01 0900) Resp:  [16-20] 16 (09/01 0730) BP: (86-126)/(41-65) 108/46 mmHg (09/01 0900) SpO2:  [96 %-100 %] 96 % (09/01 0730) Weight:  [228 lb 6.3 oz (103.6 kg)-239 lb 6.7 oz (108.6 kg)] 228 lb 6.3 oz (103.6 kg) (09/01 0730) Last BM Date: 03/10/13 General:   Alert,  Well-developed, well-nourished, pleasant and cooperative in NAD Abdomen:  Full. Positive bowel sounds. Soft and nontender without mass. \ Extremities:  Without clubbing or edema.    Intake/Output from previous day: 08/31 0701 - 09/01 0700 In: 480 [P.O.:480] Out: -  Intake/Output this shift:    Lab Results:  Recent Labs  03/10/13 0538 03/11/13 0522 03/11/13 1423 03/12/13 0515  WBC 6.8 5.1  --  5.6  HGB 9.2* 7.9* 7.7* 7.2*  HCT 27.5* 24.3* 22.9* 21.9*  PLT 102* 84*  --  PENDING   BMET  Recent Labs  03/10/13 0538 03/11/13 0522 03/12/13 0515  NA 136 131* 133*  K 3.6 3.6 3.8  CL 101 97 99  CO2 30 31 30   GLUCOSE 108* 132* 175*  BUN 27* 22 31*  CREATININE 4.96* 3.86* 4.72*  CALCIUM 8.0* 8.1* 8.4  No results found.   Impression:  62 year old gentleman EtOH cirrhosis and recurrent GI bleeding secondary GAVE.  Notable drop in hemoglobin without recurrent overt bleeding. He looks fairly stable at this time.   Recommendations:   Plan to recheck an H&H at 1800 today and go from there.

## 2013-03-12 NOTE — Progress Notes (Signed)
CRITICAL VALUE ALERT  Critical value received:  Hemeglobin 6.3   Date of notification:  03/12/13  Time of notification:  1817  Critical value read back:yes  Nurse who received alert:  Zannie Kehr, Rn   MD notified (1st page):  Memon  Time of first page:  1820  MD notified (2nd page):  Time of second page:  Responding MD:  Kerry Hough  Time MD responded:  1822  New orders for blood given.

## 2013-03-12 NOTE — Procedures (Signed)
   HEMODIALYSIS TREATMENT NOTE:  3.5 hour heparin-free dialysis session completed through left forearm AVF (15g/antegrade).  Goal NOT met:  Unable to tolerate removal of 2 liters as prescribed due to hypotension, despite Albumin administration.  Net UF removed = 224cc. Dr. Kristian Covey was notified.  All blood was reinfused.  Hemostasis was achieved within 10 minutes.  Report given to Zannie Kehr, RN.  Tamerra Merkley L. Brodie Scovell, RN, CDN

## 2013-03-12 NOTE — Progress Notes (Signed)
Subjective: Interval History: has no complaint of nausea or vomiting. Presently patient claims  feeling better. Has some abdominal pain. He tolerated feeding  Objective: Vital signs in last 24 hours: Temp:  [98.3 F (36.8 C)-98.6 F (37 C)] 98.4 F (36.9 C) (09/01 0730) Pulse Rate:  [100-120] 118 (09/01 0915) Resp:  [16-20] 16 (09/01 0730) BP: (86-126)/(41-65) 101/43 mmHg (09/01 0915) SpO2:  [96 %-100 %] 96 % (09/01 0730) Weight:  [103.6 kg (228 lb 6.3 oz)-108.6 kg (239 lb 6.7 oz)] 103.6 kg (228 lb 6.3 oz) (09/01 0730) Weight change: 1.8 kg (3 lb 15.5 oz)  Intake/Output from previous day: 08/31 0701 - 09/01 0700 In: 480 [P.O.:480] Out: -  Intake/Output this shift:    Generally he is alert and in no apparent distress. Chest decreased breath sound bilaterally. Abdomen is full and nontender positive bowel sounds. Extremities has 2+ edema bilaterally.  Lab Results:  Recent Labs  03/11/13 0522 03/11/13 1423 03/12/13 0515  WBC 5.1  --  5.6  HGB 7.9* 7.7* 7.2*  HCT 24.3* 22.9* 21.9*  PLT 84*  --  PENDING   BMET:   Recent Labs  03/11/13 0522 03/12/13 0515  NA 131* 133*  K 3.6 3.8  CL 97 99  CO2 31 30  GLUCOSE 132* 175*  BUN 22 31*  CREATININE 3.86* 4.72*  CALCIUM 8.1* 8.4   No results found for this basename: PTH,  in the last 72 hours Iron Studies: No results found for this basename: IRON, TIBC, TRANSFERRIN, FERRITIN,  in the last 72 hours  Studies/Results: No results found.  I have reviewed the patient's current medications.  Assessment/Plan: Problem #1 end-stage renal disease presently patient is on  Dialysis. Pre dialysis BUN is 31  and his creatinine is 4.72  and  potassium of 3.8 Presently he doesn't have any uremic sign and symptoms. Problem #2 anemia this is secondary to GI bleeding. His hemoglobin hematocrit has decreased today presently patient seems to be requiring repeated blood transfusion. Problem #3 diabetes Problem #4 history of liver  cirrhosis  Problem #5 metabolic bone disease his calcium and phosphorus is stable. Problem #6 noncompliance with dialysis Problem #7 trauma cytopenia Problem #8 hepatic encephalopathy. Patient is alert and answering questions appropriately. Problem #9 hypotension  Problem #10 CHF patient with increasing edema. Attempt to ultrafiltrate seems to be difficult because of hypotension. Presently able to get 500cc and blood pressure seems declining Plan:we will give him albumin on dialysis     LOS: 7 days   Jibril Mcminn S 03/12/2013,9:26 AM

## 2013-03-12 NOTE — Clinical Social Work Note (Signed)
Updated [facility] on pt. They continue to be willing to accept pt at d/c. CSW will follow.  Derenda Fennel, [place] [phone]

## 2013-03-12 NOTE — Progress Notes (Signed)
Hbg of 7.2, on call MD paged to on coming nurse.

## 2013-03-13 ENCOUNTER — Encounter (HOSPITAL_COMMUNITY): Payer: Self-pay | Admitting: Internal Medicine

## 2013-03-13 LAB — CBC
Hemoglobin: 8.2 g/dL — ABNORMAL LOW (ref 13.0–17.0)
MCH: 30 pg (ref 26.0–34.0)
RBC: 2.73 MIL/uL — ABNORMAL LOW (ref 4.22–5.81)

## 2013-03-13 LAB — GLUCOSE, CAPILLARY
Glucose-Capillary: 129 mg/dL — ABNORMAL HIGH (ref 70–99)
Glucose-Capillary: 102 mg/dL — ABNORMAL HIGH (ref 70–99)
Glucose-Capillary: 100 mg/dL — ABNORMAL HIGH (ref 70–99)

## 2013-03-13 LAB — BASIC METABOLIC PANEL
CO2: 32 mEq/L (ref 19–32)
Glucose, Bld: 100 mg/dL — ABNORMAL HIGH (ref 70–99)
Potassium: 4.2 mEq/L (ref 3.5–5.1)
Sodium: 137 mEq/L (ref 135–145)

## 2013-03-13 LAB — TYPE AND SCREEN
Antibody Screen: NEGATIVE
Unit division: 0
Unit division: 0

## 2013-03-13 MED ORDER — NEPRO/CARBSTEADY PO LIQD
237.0000 mL | Freq: Two times a day (BID) | ORAL | Status: DC
  Administered 2013-03-13 – 2013-03-16 (×5): 237 mL via ORAL

## 2013-03-13 MED ORDER — LIDOCAINE-PRILOCAINE 2.5-2.5 % EX CREA
TOPICAL_CREAM | Freq: Once | CUTANEOUS | Status: AC
  Administered 2013-03-14: 07:00:00 via TOPICAL
  Filled 2013-03-13: qty 5

## 2013-03-13 NOTE — Progress Notes (Addendum)
Nutrition Follow-up  INTERVENTION:  ProStat 30 ml TID (each 30 ml provides 100 kcal, 15 gr protein) Nepro Shake po BID, each supplement provides 425 kcal and 19 grams protein. Recommend liberalize diet as much as medically feasible   NUTRITION DIAGNOSIS: Malnutrition; ongoing   Goal: Pt to meet >/= 90% of their estimated nutrition needs;  not met  Monitor: Po intake, labs and changes in dry wt  62 y.o. male  Admitting Dx: Acute blood loss anemia  ASSESSMENT: Patient Active Problem List   Diagnosis Date Noted  . Protein-calorie malnutrition, severe 03/08/2013  . Malnutrition of moderate degree 02/16/2013  . Upper GI bleeding 02/14/2013  . ESRD on dialysis 02/14/2013  . Anorexia 01/18/2013  . 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 Thrombocytopenia, unspecified 10/31/2012  . SIRS (systemic inflammatory response syndrome) 10/26/2012  . Abdominal pain 10/26/2012  . Peritonitis associated with peritoneal dialysis 10/26/2012  . GI bleed 10/24/2012  . ESRD on peritoneal dialysis 10/20/2012  . Acute blood loss anemia 10/20/2012  . Hyperammonemia 10/20/2012  . Altered mental status 10/20/2012  . Alcoholic cirrhosis/remote TIPS 04/10/2012  . Hepatic encephalopathy 04/10/2012  . Diabetes mellitus type 2, uncontrolled, with complications 04/10/2012  . 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 CKD (chronic kidney disease) stage V requiring chronic dialysis 04/10/2012  . Peripheral neuropathy 04/10/2012   Pt appetite is fair. Po intake 50-75% meals. He is s/p additional 2 units PRBC's yesterday. Family meeting pending to discuss pt care goals going forward. Poor prognosis.   Height: Ht Readings from Last 1 Encounters:  03/09/13 6\' 3"  (1.905 m)    Weight Status: Wt Readings from Last 1 Encounters:  03/12/13 228 lb 6.3 oz (103.6 kg)  03/05/13 Admit wt 206#  Ideal Body Weight: 196# (89 kg)   Wt Readings from Last 10 Encounters:  03/12/13 228 lb 6.3 oz (103.6 kg)   03/12/13 228 lb 6.3 oz (103.6 kg)  03/12/13 228 lb 6.3 oz (103.6 kg)  03/12/13 228 lb 6.3 oz (103.6 kg)  02/27/13 213 lb 9.6 oz (96.888 kg)  01/18/13 210 lb (95.255 kg)  01/15/13 220 lb (99.791 kg)  01/05/13 228 lb (103.42 kg)  12/19/12 230 lb (104.327 kg)  11/25/12 226 lb 6.6 oz (102.7 kg)    BMI:  Body mass index is 28.55 kg/(m^2).skewed due to fluid status  Estimated Nutritional Needs: Kcal: 2400-2700  Protein: 110-125 gr  Fluid: 1000 ml plus urine output   Skin: No issues noted  Diet Order: Renal  EDUCATION NEEDS: -Education needs addressed   Intake/Output Summary (Last 24 hours) at 03/13/13 1218 Last data filed at 03/13/13 0900  Gross per 24 hour  Intake 1358.33 ml  Output      0 ml  Net 1358.33 ml  Net since admission 03/06/13 +6 liters  Last BM: 03/10/13   Labs:   Recent Labs Lab 03/07/13 0447  03/09/13 0453  03/11/13 0522 03/12/13 0515 03/13/13 0622  NA 135  < > 136  < > 131* 133* 137  K 3.8  < > 3.3*  < > 3.6 3.8 4.2  CL 101  < > 101  < > 97 99 103  CO2 30  < > 29  < > 31 30 32  BUN 23  < > 21  < > 22 31* 25*  CREATININE 4.63*  < > 3.60*  < > 3.86* 4.72* 3.56*  CALCIUM 7.9*  < > 7.8*  < > 8.1* 8.4 8.3*  PHOS 2.8  --  2.3  --   --  2.3  --   GLUCOSE 130*  < > 85  < > 132* 175* 100*  < > = values in this interval not displayed.  CBG (last 3)   Recent Labs  03/13/13 0553 03/13/13 0651 03/13/13 1132  GLUCAP 102* 100* 128*    Scheduled Meds: . antiseptic oral rinse  15 mL Mouth Rinse q12n4p  . ciprofloxacin  500 mg Oral Daily  . folic acid  1 mg Oral QHS  . furosemide  80 mg Oral BID  . hydrocortisone valerate ointment  1 application Topical Q M,W,F  . insulin aspart  0-9 Units Subcutaneous Q6H  . metoCLOPramide  5 mg Oral TID AC & HS  . pantoprazole (PROTONIX) IV  40 mg Intravenous Q24H  . sevelamer carbonate  800 mg Oral TID WC  . sodium chloride  10 mL Intravenous Q12H  . sucralfate  1 g Oral QID    Continuous Infusions:   Past  Medical History  Diagnosis Date  . Rectal bleeding   . Dialysis care   . GI bleed   . Hypertension   . Esophageal varices   . Alcoholic liver disease   . Hepatic encephalopathy   . Ascites   . Diabetes mellitus   . Detached retina   . Renal disorder   . Pneumonia     Past Surgical History  Procedure Laterality Date  . Esophagogastroduodenoscopy  12/31/2010    EGD [facility] THERAPY  . Esophagogastroduodenoscopy  05/31/2012E    GD [facility] ABLATION  . Small bowel givens  12/01/2010  . Colonoscopy  09/07/2010  . Esophagogastroduodenoscopy  09/05/2010    OUTLAW  . Lung surgery  6/11    [place]  . Esophagogastroduodenoscopy  03/26/2011    Procedure: ESOPHAGOGASTRODUODENOSCOPY (EGD);  Surgeon: Malissa Hippo, MD;  [facility];  Service: Endoscopy;  Laterality: N/A;  8:30 am  . Hot hemostasis  03/26/2011    Procedure: HOT HEMOSTASIS (ARGON PLASMA COAGULATION/BICAP);  Surgeon: Malissa Hippo, MD;  [facility];  Service: Endoscopy;  Laterality: N/A;  . Stent in liver    . Esophagogastroduodenoscopy N/A 10/24/2012    Procedure: ESOPHAGOGASTRODUODENOSCOPY (EGD);  Surgeon: Malissa Hippo, MD;  [facility];  Service: Endoscopy;  Laterality: N/A;  . Esophagogastroduodenoscopy N/A 11/22/2012    Procedure: ESOPHAGOGASTRODUODENOSCOPY (EGD);  Surgeon: Charna Elizabeth, MD;  Location[facility]OSCOPY;  Service: Endoscopy;  Laterality: N/A;  . Paracentesis    . Esophagogastroduodenoscopy N/A 12/21/2012    Procedure: ESOPHAGOGASTRODUODENOSCOPY (EGD);  Surgeon: Malissa Hippo, MD;  [facility];  Service: Endoscopy;  Laterality: N/A;  250-moved to 155 Ann to notify pt  . Hot hemostasis N/A 12/21/2012    Procedure: HOT HEMOSTASIS (ARGON PLASMA COAGULATION/BICAP);  Surgeon: Malissa Hippo, MD;  [facility];  Service: Endoscopy;  Laterality: N/A;  . Esophagogastroduodenoscopy N/A 01/25/2013    Procedure: ESOPHAGOGASTRODUODENOSCOPY (EGD);  Surgeon:  Malissa Hippo, MD;  [facility];  Service: Endoscopy;  Laterality: N/A;  100  . Hot hemostasis N/A 01/25/2013    Procedure: HOT HEMOSTASIS (ARGON PLASMA COAGULATION/BICAP);  Surgeon: Malissa Hippo, MD;  [facility];  Service: Endoscopy;  Laterality: N/A;  . Esophagogastroduodenoscopy N/A 02/16/2013    Procedure: ESOPHAGOGASTRODUODENOSCOPY (EGD);  Surgeon: Malissa Hippo, MD;  [facility];  Service: Endoscopy;  Laterality: N/A;  . Esophagogastroduodenoscopy N/A 03/07/2013    Procedure: ESOPHAGOGASTRODUODENOSCOPY (EGD);  Surgeon: Joline Maxcy  Karilyn Cota, MD;  [facility];  Service: Endoscopy;  Laterality: N/A;    Royann Shivers MS,RD,LDN,CSG Office: [phone] Pager: [phone]

## 2013-03-13 NOTE — Progress Notes (Signed)
Subjective; Patient has no complaints. He is aware that not much fluid was removed at the time of hemodialysis yesterday. He remains with very good appetite. He denies abdominal pain nausea vomiting or melena. Last BM was 3 days ago.  Objective; BP 141/73  Pulse 110  Temp(Src) 98.4 F (36.9 C) (Oral)  Resp 16  Ht 6\' 3"  (1.905 m)  Wt 228 lb 6.3 oz (103.6 kg)  BMI 28.55 kg/m2  SpO2 98% Patient is alert and does not have asterixis. He appears pale. Abdomen is full but nontender. LE edema remains unchanged.  Lab data; H&H 8.2 and 24.5 Platelet count 86K.  Assessment; Recurrent GI Bleed sec to GAVE. S/P APC therapy twise last week. Patient has received 7 units of PRBc this admission. No BM in almost three days. It appears he has stopped bleeding but he is at high risk for rebleed. His prognosis remains poor as long as underlying condition does not change. He unfortunately is not a candidate for organ transplant. He is in need of liver and renal transplant. Dr. Kerry Hough and I will meet with family members to discuss his condition and consider hospice care.

## 2013-03-13 NOTE — Progress Notes (Signed)
[facility] PROGRESS NOTE  Timothy Chan ZOX:[id number] DOB: 01-16-1951 DOA: 03/05/2013 PCP: Ignatius Specking., MD  Brief summary:  This patient was admitted to the hospital with hypotension and GI bleeding.  He was admitted to the step down unit.  He was seen by GI and has undergone 2 endoscopies during this hospitalization.  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  Anemia has been difficult to manage and patient has received a total of 7 units of prbc thus far. Hemodialysis has also been difficult since everytime ultrafiltration is attempted, patient becomes hypotensive. After extensive discussions with nephrology and GI, it was recommended to pursue a hospice approach to his care. At this time, it does not appear that the patient is willing to accept hospice care and wishes to continue with current treatments.   Plan will be to recheck hemoglobin in AM and if it is stable, consider discharge back to SNF.    Assessment/Plan Severe anemia, normocytic: Acute on chronic. Acute blood loss anemia secondary to GI bleed. Status post 7 units packed red blood cells. Results of EGD as below.  Patient is on protonix and carafate. Repeat hemoglobin in AM  Abdominal pain: Significance unclear. Appears resolved. Exam benign.   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   End-stage renal disease: Management per nephrology. Fluid removal has been challenging during dialysis due to hypotension. Patient to receive albumin again.  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 History of TIPS procedure. Per chart review he has been evaluated at [facility] in the past and is not felt to be a candidate for transplant.  Diabetes mellitus: Capillary blood sugars stable.   Noncompliance with hemodialysis, history of leaving AMA.  IV access. Patient had central line placed by general surgery.  Patient is being considered  for portacath placement as an outpatient. Can follow up with Dr. Leticia Penna..  Discussion:  Dr. Karilyn Cota and I met with patient and multiple family members to discuss further goals of care. It does not appear that patient is ready to accept hospice services and wishes to continue with current treatments. Once he is stable, he will return to SNF  Code Status: full code Family Communication: no family present Disposition Plan: discharge back to [facility] when stable   Consultants: GI, Dr. Karilyn Cota Nephrology, Dr. Kristian Covey General Surgery, Dr. Leticia Penna  Procedures:  Central line placement, right subclavian 8/29, Dr. Leticia Penna  Endoscopy 8/27: Limited evaluation of upper GI tract secondary to large food debris and some coffee-ground material. Slow bleed or wheezing noted from telangiectasia at gastric antrum.he was treated with argon plasma coagulator with hemostasis another site with oozing and prepyloric area could not be seen because of food debris.  Endoscopy 8/29: Portal gastropathy. 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 Bleeding controlled with argon plasma coagulation  Paracentesis 8/29 with removal of of fluid  Regular hemodialysis  Antibiotics:  Ciprofloxacin daily for sbp prophylaxis  HPI/Subjective: No bowel movements. No visible signs of bleeding. Complains of pain in legs  Objective: Filed Vitals:   03/13/13 1352  BP: 120/50  Pulse: 103  Temp: 98.5 F (36.9 C)  Resp: 16    Intake/Output Summary (Last 24 hours) at 03/13/13 2011 Last data filed at 03/13/13 1832  Gross per 24 hour  Intake 1298.33 ml  Output      0 ml  Net 1298.33 ml   [employer]  03/11/13 0500 03/12/13 0409 03/12/13 0730  Weight: 109.1 kg (240 lb 8.4 oz) 108.6 kg (239 lb 6.7 oz) 103.6 kg (228 lb 6.3 oz)    Exam:   General:  NAD, awake and alert, no asterixis  Cardiovascular: S1, S2 rrr  Respiratory: crackles at bases  Abdomen: soft, distended,  tender in periumbilical region, positive bowel sounds  Musculoskeletal: 2-3+ edema in the extremities bilaterally   Data Reviewed: Basic Metabolic Panel:  Recent Labs Lab 03/07/13 0447  03/09/13 0453 03/10/13 0538 03/11/13 0522 03/12/13 0515 03/13/13 0622  NA 135  < > 136 136 131* 133* 137  K 3.8  < > 3.3* 3.6 3.6 3.8 4.2  CL 101  < > 101 101 97 99 103  CO2 30  < > 29 30 31 30  32  GLUCOSE 130*  < > 85 108* 132* 175* 100*  BUN 23  < > 21 27* 22 31* 25*  CREATININE 4.63*  < > 3.60* 4.96* 3.86* 4.72* 3.56*  CALCIUM 7.9*  < > 7.8* 8.0* 8.1* 8.4 8.3*  PHOS 2.8  --  2.3  --   --  2.3  --   < > = values in this interval not displayed. Liver Function Tests: No results found for this basename: AST, ALT, ALKPHOS, BILITOT, PROT, ALBUMIN,  in the last 168 hours No results found for this basename: LIPASE, AMYLASE,  in the last 168 hours No results found for this basename: AMMONIA,  in the last 168 hours CBC:  Recent Labs Lab 03/09/13 0453 03/10/13 0538 03/11/13 0522 03/11/13 1423 03/12/13 0515 03/12/13 1745 03/13/13 0622  WBC 6.3 6.8 5.1  --  5.6  --  6.2  HGB 8.4* 9.2* 7.9* 7.7* 7.2* 6.3* 8.2*  HCT 24.4* 27.5* 24.3* 22.9* 21.9* 19.3* 24.5*  MCV 91.4 92.3 93.5  --  92.8  --  89.7  PLT 99* 102* 84*  --  84*  --  86*   Cardiac Enzymes: No results found for this basename: CKTOTAL, CKMB, CKMBINDEX, TROPONINI,  in the last 168 hours BNP (last 3 results)  Recent Labs  11/21/12 1145  PROBNP 1167.0*   CBG:  Recent Labs Lab 03/13/13 0014 03/13/13 0553 03/13/13 0651 03/13/13 1132 03/13/13 1725  GLUCAP 129* 102* 100* 128* 119*    Recent Results (from the past 240 hour(s))  CULTURE, BODY FLUID-BOTTLE     Status: None   Collection Time    03/09/13  2:07 PM      Result Value Range Status   Specimen Description FLUID ASCITIC COLLECTED BY DOCTOR BOLES   Final   Special Requests BOTTLES DRAWN AEROBIC AND ANAEROBIC 10CC   Final   Culture NO GROWTH 4 DAYS   Final   Report  Status PENDING   Incomplete  GRAM STAIN     Status: None   Collection Time    03/09/13  2:12 PM      Result Value Range Status   Specimen Description ASCITIC   Final   Special Requests NONE   Final   Gram Stain     Final   Value: NO ORGANISMS SEEN     FEW WBC SEEN   Report Status 03/09/2013 FINAL   Final     Studies: No results found.  Scheduled Meds: . antiseptic oral rinse  15 mL Mouth Rinse q12n4p  . ciprofloxacin  500 mg Oral Daily  . feeding supplement (NEPRO CARB STEADY)  237 mL Oral BID BM  . folic acid  1 mg  Oral QHS  . furosemide  80 mg Oral BID  . hydrocortisone valerate ointment  1 application Topical Q M,W,F  . insulin aspart  0-9 Units Subcutaneous Q6H  . [START ON 03/14/2013] lidocaine-prilocaine   Topical Once  . metoCLOPramide  5 mg Oral TID AC & HS  . pantoprazole (PROTONIX) IV  40 mg Intravenous Q24H  . sevelamer carbonate  800 mg Oral TID WC  . sodium chloride  10 mL Intravenous Q12H  . sucralfate  1 g Oral QID   Continuous Infusions:    Principal Problem:   Acute blood loss anemia Active Problems:   Alcoholic cirrhosis/remote TIPS   Diabetes mellitus type 2, uncontrolled, with complications   GI bleed   Abdominal pain   Thrombocytopenia, unspecified   GAVE (gastric antral vascular ectasia)   ESRD on dialysis   Protein-calorie malnutrition, severe    Time spent:    [facility]  [facility] Pager [phone]. If 7PM-7AM, please contact night-coverage at [email or web address], password [facility] 03/13/2013, 8:11 PM  LOS: 8 days

## 2013-03-13 NOTE — Clinical Social Work Note (Signed)
CSW met w patient at bedside, patient expressed desire to go home, says he can pay half of cost of a full time caregiver, wondered if "someone else can pay the other half."  Understands that he can return to SNF, afraid he will lose his house/land/car.  Discouraged about his options at present.  Says [name] is a "long time friend" who is arriving at 2 PM, along w a son who is flying in from [place].  Patient anticipates that a family conference w MDs, [name] and son might generate some other options for him at discharge.  Santa Genera, LCSW Clinical Social Worker [phone])

## 2013-03-13 NOTE — Progress Notes (Signed)
Subjective: Interval History: has no complaint of nausea or vomiting. Presently patient claims  feeling better. He has still abdominal pain. His appetite is okay. Complaints of weakness. Objective: Vital signs in last 24 hours: Temp:  [98.1 F (36.7 C)-98.8 F (37.1 C)] 98.4 F (36.9 C) (09/02 0554) Pulse Rate:  [99-120] 110 (09/02 0554) Resp:  [15-18] 16 (09/02 0554) BP: (82-141)/(41-73) 141/73 mmHg (09/02 0554) SpO2:  [95 %-100 %] 98 % (09/02 0554) Weight change: -5 kg (-11 lb 0.4 oz)  Intake/Output from previous day: 09/01 0701 - 09/02 0700 In: 1238.3 [Blood:1238.3] Out: 224  Intake/Output this shift:    Generally he is alert and in no apparent distress. Chest decreased breath sound bilaterally. Heart exam revealed a regular rate and rhythm. Abdomen is full and nontender positive bowel sounds. Extremities has 2+ edema bilaterally.  Lab Results:  Recent Labs  03/12/13 0515 03/12/13 1745 03/13/13 0622  WBC 5.6  --  6.2  HGB 7.2* 6.3* 8.2*  HCT 21.9* 19.3* 24.5*  PLT 84*  --  86*   BMET:   Recent Labs  03/12/13 0515 03/13/13 0622  NA 133* 137  K 3.8 4.2  CL 99 103  CO2 30 32  GLUCOSE 175* 100*  BUN 31* 25*  CREATININE 4.72* 3.56*  CALCIUM 8.4 8.3*   No results found for this basename: PTH,  in the last 72 hours Iron Studies: No results found for this basename: IRON, TIBC, TRANSFERRIN, FERRITIN,  in the last 72 hours  Studies/Results: No results found.  I have reviewed the patient's current medications.  Assessment/Plan: Problem #1 end-stage renal disease . He status post hemodialysis yesterday. His BUN is 25 and creatinine still 3.56 with potassium of 4.2. Problem #2 anemia this is secondary to GI bleeding. His hemoglobin hematocrit has decreased today presently patient seems to be requiring repeated blood transfusion. His hemoglobin 8.2 and hematocrit is 24.5 patient has received blood transfusion yesterday. Problem #3 diabetes Problem #4 history of  liver cirrhosis  Problem #5 metabolic bone disease his calcium and phosphorus is stable. Problem #6 noncompliance with dialysis Problem #7 trauma cytopenia Problem #8 hepatic encephalopathy. Patient is alert and answering questions appropriately. Problem #9 hypotension  Problem #10 CHF patient with increasing edema. Attempt to ultrafiltrate seems to be difficult because of hypotension. Patient still with significant edema. Presently very difficult to ultrafiltrate because of recurrent hypotension in spite of using albumin. Presently his prognosis is very poor I have discussed with him about DO NOT RESUSCITATE. Patient is waiting for his son to come today. Plan: Since patient is still wants to continue his dialysis until make a decision will make arrangement for tomorrow. We'll check his basic metabolic panel and CBC in the morning.     LOS: 8 days   Shalea Tomczak S 03/13/2013,8:22 AM

## 2013-03-14 DIAGNOSIS — D62 Acute posthemorrhagic anemia: Secondary | ICD-10-CM

## 2013-03-14 DIAGNOSIS — R109 Unspecified abdominal pain: Secondary | ICD-10-CM

## 2013-03-14 DIAGNOSIS — K922 Gastrointestinal hemorrhage, unspecified: Secondary | ICD-10-CM

## 2013-03-14 DIAGNOSIS — K7460 Unspecified cirrhosis of liver: Secondary | ICD-10-CM

## 2013-03-14 LAB — CULTURE, BODY FLUID W GRAM STAIN -BOTTLE

## 2013-03-14 LAB — BASIC METABOLIC PANEL
BUN: 36 mg/dL — ABNORMAL HIGH (ref 6–23)
CO2: 31 mEq/L (ref 19–32)
Calcium: 8.6 mg/dL (ref 8.4–10.5)
Chloride: 101 mEq/L (ref 96–112)
Creatinine, Ser: 4.47 mg/dL — ABNORMAL HIGH (ref 0.50–1.35)

## 2013-03-14 LAB — CBC
HCT: 23.1 % — ABNORMAL LOW (ref 39.0–52.0)
MCHC: 33.8 g/dL (ref 30.0–36.0)
MCV: 90.6 fL (ref 78.0–100.0)
Platelets: 104 10*3/uL — ABNORMAL LOW (ref 150–400)
RDW: 17 % — ABNORMAL HIGH (ref 11.5–15.5)

## 2013-03-14 LAB — GLUCOSE, CAPILLARY
Glucose-Capillary: 129 mg/dL — ABNORMAL HIGH (ref 70–99)
Glucose-Capillary: 173 mg/dL — ABNORMAL HIGH (ref 70–99)

## 2013-03-14 MED ORDER — LIDOCAINE HCL (PF) 1 % IJ SOLN: 5.0000 mL | INTRAMUSCULAR | Status: DC | PRN

## 2013-03-14 MED ORDER — HEPARIN SODIUM (PORCINE) 1000 UNIT/ML DIALYSIS
1000.0000 [IU] | INTRAMUSCULAR | Status: DC | PRN
  Filled 2013-03-14: qty 1

## 2013-03-14 MED ORDER — PENTAFLUOROPROP-TETRAFLUOROETH EX AERO
1.0000 "application " | INHALATION_SPRAY | CUTANEOUS | Status: DC | PRN
  Filled 2013-03-14: qty 103.5

## 2013-03-14 MED ORDER — SODIUM CHLORIDE 0.9 % IV SOLN: 100.0000 mL | INTRAVENOUS | Status: DC | PRN

## 2013-03-14 MED ORDER — LIDOCAINE-PRILOCAINE 2.5-2.5 % EX CREA
1.0000 "application " | TOPICAL_CREAM | CUTANEOUS | Status: DC | PRN
  Filled 2013-03-14: qty 5

## 2013-03-14 MED ORDER — NEPRO/CARBSTEADY PO LIQD: 237.0000 mL | ORAL | Status: DC | PRN

## 2013-03-14 MED ORDER — ALTEPLASE 2 MG IJ SOLR
2.0000 mg | Freq: Once | INTRAMUSCULAR | Status: AC | PRN
  Filled 2013-03-14: qty 2

## 2013-03-14 NOTE — Progress Notes (Signed)
[facility] PROGRESS NOTE  Timothy Chan AVW:[id number] DOB: [birth year] DOA: 03/05/2013 PCP: Timothy Specking., MD  Assessment/Plan: 1. Severe anemia, normocytic: Acute on chronic. Acute blood loss anemia secondary to GI bleed. Status post 7 units packed red blood cells.  Continue PPI Carafate. Follow CBC. EGD 8/27 showed slow bleed from telangiectasia at gastric antrum, treated with APC 2. Abdominal pain: Resolved shortly after admission. No documentation to suggest recurrence. 3. 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 4. End-stage renal disease: Management per nephrology. Hemodialysis has been difficult secondary to hypotension but was successfully tolerated today. 5. 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 History of TIPS procedure. Dr. Karilyn Chan discussed wtih Dr. Pollyann Chan patient's transplant hepatologist at [facility]. "Now that patient is not ambulatory he is not a candidate for hepatorenal transplant unless he improves and becomes ambulatory. He recommended office visit when patient is back on his feet and he should bring along his son on a family member " 6. Diabetes mellitus: Capillary blood sugars stable. 7. Noncompliance with hemodialysis, history of leaving AMA. 8. IV access: Patient had central line placed by general surgery. Patient is being considered for portacath placement as an outpatient. Can follow up with Dr. Leticia Chan. 9. Ongoing malnutrition  Dr. Kerry Chan had a frank discussion with the patient 8/31, 9/1.  Dr. Karilyn Chan and Dr. Kerry Chan met with patient and multiple family members to discuss further goals of care. It does not appear that patient is ready to accept hospice services and wishes to continue with current treatments. Once he is stable, he will return to SNF   Overall appears to be stabilizing. No evidence of recurrent bleeding. MRA dialysis.  Check CBC in the morning, anticipate discharge 9/4  Pending studies:    None  Code Status: Full code DVT prophylaxis: SCDs Family Communication: None present Disposition Plan: To skilled nursing facility 9/4  Timothy Sacks, MD  [facility]  Pager [phone] If 7PM-7AM, please contact night-coverage at [email or web address], password [facility] 03/14/2013, 12:20 PM  LOS: 9 days   Summary: 62 year old man with complex medical history including recurrent GI bleed, esophageal varices, GAVE and other complications from alcoholic cirrhosis who presented to the emergency department with history of hematochezia and profound anemia. He was admitted for upper GI bleed, acute blood loss anemia, alcoholic liver cirrhosis with decompensation, deconditioning.  This patient was admitted to the hospital with hypotension and GI bleeding. He was admitted to the step down unit. He was seen by GI and has undergone 2 endoscopies during this hospitalization. 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 Anemia has been difficult to manage and patient has received a total of 7 units of prbc thus far. Hemodialysis has also been difficult since everytime ultrafiltration is attempted, patient becomes hypotensive. After extensive discussions with nephrology and GI, it was recommended to pursue a hospice approach to his care. At this time, it does not appear that the patient is willing to accept hospice care and wishes to continue with current treatments. Plan will be to recheck hemoglobin in AM and if it is stable, consider discharge back to SNF.  Consultants:  GI  Nephrology  Procedures:  Routine HD Transfusion total 7 units packed red blood cells EGD 8/27: Limited evaluation of upper GI tract secondary to large for debris and some coffee-ground material. Slow bleed or wheezing noted from telangiectasia at gastric antrum.he was treated with argon plasma coagulator with hemostasis another  site with oozing and prepyloric area could not be seen because  of food debris. Insertion of subclavian central line 8/29 Ultrasound guided paracentesis 8/29 removal 1080 mL EGD 8/29: Portal gastropathy. 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 Bleeding controlled with argon plasma coagulation.  Antibiotics: Cipro for SBP prophylaxis.  HPI/Subjective: Tolerated dialysis today. Feels okay. No bleeding that he knows of.  Objective: Filed Vitals:   03/14/13 1045 03/14/13 1115 03/14/13 1145 03/14/13 1200  BP: 106/59 112/59 106/56 116/51  Pulse: 114 116 120 122  Temp:      TempSrc:      Resp:      Height:      Weight:      SpO2:        Intake/Output Summary (Last 24 hours) at 03/14/13 1220 Last data filed at 03/14/13 0800  Gross per 24 hour  Intake    360 ml  Output      0 ml  Net    360 ml     Filed Weights   03/12/13 0409 03/12/13 0730 03/14/13 0915  Weight: 108.6 kg (239 lb 6.7 oz) 103.6 kg (228 lb 6.3 oz) 104.5 kg (230 lb 6.1 oz)    Exam:   Afebrile, tachycardic, labile blood pressure.  General: Appears calm and comfortable.  Psychiatric: Grossly normal mood and affect. Speech fluent and appropriate.  Cardiovascular: Regular rate and rhythm. No murmur, rub, gallop. 3+ bilateral lower extremity edema.  Respiratory: Clear to auscultation bilaterally. No wheezes, rales, rhonchi. Normal respiratory effort.  Abdomen: Soft, nontender, nondistended  Skin: Appears grossly unremarkable.  Data Reviewed:  +6.58 L  Capillary blood sugars stable  Basic metabolic panel consistent with end-stage renal disease  Hemoglobin mildly decreased 8.2 >> 7.8.  Scheduled Meds: . antiseptic oral rinse  15 mL Mouth Rinse q12n4p  . ciprofloxacin  500 mg Oral Daily  . feeding supplement (NEPRO CARB STEADY)  237 mL Oral BID BM  . folic acid  1 mg Oral QHS  . furosemide  80 mg Oral BID  . hydrocortisone valerate ointment  1 application Topical Q M,W,F  . insulin aspart  0-9 Units Subcutaneous Q6H  .  metoCLOPramide  5 mg Oral TID AC & HS  . pantoprazole (PROTONIX) IV  40 mg Intravenous Q24H  . sevelamer carbonate  800 mg Oral TID WC  . sodium chloride  10 mL Intravenous Q12H  . sucralfate  1 g Oral QID   Continuous Infusions:   Principal Problem:   Acute blood loss anemia Active Problems:   Alcoholic cirrhosis/remote TIPS   Diabetes mellitus type 2, uncontrolled, with complications   GI bleed   Abdominal pain   Thrombocytopenia, unspecified   GAVE (gastric antral vascular ectasia)   ESRD on dialysis   Protein-calorie malnutrition, severe   Time spent 45 minutes

## 2013-03-14 NOTE — Progress Notes (Signed)
Subjective; Patient feels better today. He remains with good appetite. He has not had a bowel movement in 4 days. He is quite relieved to tell me that 2 L of fluid was removed at the time of hemodialysis. He complains of left knee pain. He has had steroid shots in the past by Dr. Ophelia Charter. He also complains of pain in right flank.  Objective; BP 112/64  Pulse 118  Temp(Src) 98.6 F (37 C) (Oral)  Resp 20  Ht 6\' 3"  (1.905 m)  Wt 225 lb 8.5 oz (102.3 kg)  BMI 28.19 kg/m2  SpO2 99% Patient is alert and in no acute distress. He appears chronically ill. He has bilateral flank edema worse on the right side. Abdomen is distended but not tense. He has few areas of ecchymosis around lower abdomen. Left knee appears to be swollen compared to the right but no finding to suggest effusion. 2+ LE edema.  Lab data; WBC 6.0 H&H 7.8 and 23.1 Platelet count 104K.  Assessment; #1. Recurrent upper GI bleed secondary to GAVE. He underwent APC ablation twice last week. His hemoglobin is not coming up. There is no evidence of active bleed. He could have low-grade hemolysis. Will also check B12 and folate levels. #2. Advanced cirrhosis. Patient not a candidate for liver transplant at this time as discussed in prior notes. If his condition improves he will need hepatorenal transplant. His cirrhosis is complicated by ascites thrombocytopenia and hepatic encephalopathy. Last AFP was in March 2014. Recommendations; Will check serum haptoglobin, folate and B12 levels. Alpha-fetoprotein. Will ask lab staff to use small tubes for blood drawing. Physical therapy should be considered when he is transferred to nursing home.

## 2013-03-14 NOTE — Procedures (Signed)
   HEMODIALYSIS TREATMENT NOTE:  3.5 hour HD session completed through left forearm aVF (15g/antegrade)  GOAL MET:  Tolerated removal of 2 liters with 15 minutes of interrupted ultrafiltration time due to SBP<90 (asymptomatic).  All blood was reinfused and hemostasis was achieved within 15 minutes.  Report given to Dagoberto Ligas, RN.  Onya Eutsler L. Zayveon Raschke, RN, CDN

## 2013-03-14 NOTE — Progress Notes (Signed)
Timothy Chan  MRN: [id number]  DOB/AGE: [birth year] 62 y.o.  Primary Care Physician:VYAS,DHRUV B., MD  Admit date: 03/05/2013  Chief Complaint:  Chief Complaint  Patient presents with  . Abnormal Lab  . Rectal Bleeding    S-Pt presented on  03/05/2013 with  Chief Complaint  Patient presents with  . Abnormal Lab  . Rectal Bleeding  .   Pt seen at dialysis unit. Pt tolerating tx well. Pt offers no new complaints   meds . antiseptic oral rinse  15 mL Mouth Rinse q12n4p  . ciprofloxacin  500 mg Oral Daily  . feeding supplement (NEPRO CARB STEADY)  237 mL Oral BID BM  . folic acid  1 mg Oral QHS  . furosemide  80 mg Oral BID  . hydrocortisone valerate ointment  1 application Topical Q M,W,F  . insulin aspart  0-9 Units Subcutaneous Q6H  . metoCLOPramide  5 mg Oral TID AC & HS  . pantoprazole (PROTONIX) IV  40 mg Intravenous Q24H  . sevelamer carbonate  800 mg Oral TID WC  . sodium chloride  10 mL Intravenous Q12H  . sucralfate  1 g Oral QID       Physical Exam: Vital signs in last 24 hours: Temp:  [98.3 F (36.8 C)-98.9 F (37.2 C)] 98.3 F (36.8 C) (09/03 0915) Pulse Rate:  [98-122] 122 (09/03 1200) Resp:  [16-20] 20 (09/03 0915) BP: (106-127)/(44-66) 116/51 mmHg (09/03 1200) SpO2:  [96 %-99 %] 96 % (09/03 0915) Weight:  [230 lb 6.1 oz (104.5 kg)] 230 lb 6.1 oz (104.5 kg) (09/03 0915) Weight change:  Last BM Date: 03/10/13  Intake/Output from previous day: 09/02 0701 - 09/03 0700 In: 360 [P.O.:360] Out: -  Total I/O In: 120 [P.O.:120] Out: -    Physical Exam: General- pt is awake,alert, follows commands Resp- No acute REsp distress, Decreased BS at bases. CVS- S1S2 regular in rhythm GIT- BS+, soft, NT, ND EXT- 2+ LE Edema,  Access- left AVF + two needles in situ  Lab Results: CBC  Recent Labs  03/13/13 0622 03/14/13 0550  WBC 6.2 6.0  HGB 8.2* 7.8*  HCT 24.5* 23.1*  PLT 86* 104*    BMET  Recent Labs  03/13/13 0622 03/14/13 0550   NA 137 137  K 4.2 4.3  CL 103 101  CO2 32 31  GLUCOSE 100* 122*  BUN 25* 36*  CREATININE 3.56* 4.47*  CALCIUM 8.3* 8.6    MICRO Recent Results (from the past 240 hour(s))  CULTURE, BODY FLUID-BOTTLE     Status: None   Collection Time    03/09/13  2:07 PM      Result Value Range Status   Specimen Description FLUID ASCITIC COLLECTED BY DOCTOR BOLES   Final   Special Requests BOTTLES DRAWN AEROBIC AND ANAEROBIC 10CC   Final   Culture NO GROWTH 5 DAYS   Final   Report Status 03/14/2013 FINAL   Final  GRAM STAIN     Status: None   Collection Time    03/09/13  2:12 PM      Result Value Range Status   Specimen Description ASCITIC   Final   Special Requests NONE   Final   Gram Stain     Final   Value: NO ORGANISMS SEEN     FEW WBC SEEN   Report Status 03/09/2013 FINAL   Final      Lab Results  Component Value Date   CALCIUM 8.6 03/14/2013   PHOS  2.3 03/12/2013       Impression: 1)Renal  ESRD on HD On MWF schedule Hx of NON compliance with HD. Pt with Fluid overload but unabl to remove fluid with HD as pt becomes hypotensive Today pt is tolerating tx   2)CVS-   BP stable.   3)Anemia HGb not at goal (9--11) Admitted with GI bleed ( hx of GAVE) Multiple PRBC    4)Hyporphosphatemia Pt was hyperphosphatemic earlier But poor po intake now  5)Liver Hx of Alc Cirrhosis.  6)Electrolytes Normokalemic NOrmonatremic   7)Acid base Co2 at goal     Plan:  Will Continue current tx.        Durk Carmen S 03/14/2013, 12:19 PM

## 2013-03-15 DIAGNOSIS — K7290 Hepatic failure, unspecified without coma: Secondary | ICD-10-CM

## 2013-03-15 DIAGNOSIS — E43 Unspecified severe protein-calorie malnutrition: Secondary | ICD-10-CM

## 2013-03-15 LAB — GLUCOSE, CAPILLARY
Glucose-Capillary: 107 mg/dL — ABNORMAL HIGH (ref 70–99)
Glucose-Capillary: 159 mg/dL — ABNORMAL HIGH (ref 70–99)
Glucose-Capillary: 139 mg/dL — ABNORMAL HIGH (ref 70–99)

## 2013-03-15 LAB — PREPARE RBC (CROSSMATCH)

## 2013-03-15 LAB — AFP TUMOR MARKER: AFP-Tumor Marker: 1.8 ng/mL (ref 0.0–8.0)

## 2013-03-15 LAB — CBC
MCH: 30.3 pg (ref 26.0–34.0)
Platelets: 102 10*3/uL — ABNORMAL LOW (ref 150–400)
RBC: 2.38 MIL/uL — ABNORMAL LOW (ref 4.22–5.81)
WBC: 6.1 10*3/uL (ref 4.0–10.5)

## 2013-03-15 LAB — HAPTOGLOBIN: Haptoglobin: 112 mg/dL (ref 45–215)

## 2013-03-15 MED ORDER — LACTULOSE 10 GM/15ML PO SOLN
20.0000 g | Freq: Every day | ORAL | Status: DC
  Administered 2013-03-15 – 2013-03-16 (×2): 20 g via ORAL
  Filled 2013-03-15 (×2): qty 30

## 2013-03-15 MED ORDER — SODIUM CHLORIDE 0.9 % IV SOLN
500.0000 mL | Freq: Once | INTRAVENOUS | Status: AC
  Administered 2013-03-15: 500 mL via INTRAVENOUS

## 2013-03-15 MED ORDER — PANTOPRAZOLE SODIUM 40 MG PO TBEC
40.0000 mg | DELAYED_RELEASE_TABLET | Freq: Every day | ORAL | Status: DC
  Filled 2013-03-15: qty 1

## 2013-03-15 MED ORDER — INSULIN ASPART 100 UNIT/ML ~~LOC~~ SOLN
0.0000 [IU] | Freq: Three times a day (TID) | SUBCUTANEOUS | Status: DC
  Administered 2013-03-16: 1 [IU] via SUBCUTANEOUS

## 2013-03-15 NOTE — Progress Notes (Signed)
Patient ID: Timothy Chan, male   DOB: 1950/11/09, 62 y.o.   MRN: [id number] Spoke with Dr. Irene Limbo this am;. Timothy Chan will be transfused with 2 units of PRBCs today. Plans for him to return to NH where his H and H will be monitored there.

## 2013-03-15 NOTE — Progress Notes (Signed)
Dr. Karilyn Cota made aware of paracentesis site LLQ leaking, and dressings/bed linen/gown being saturated.  Dr. Karilyn Cota verbalized understanding.  Ordered for dressings to be changed as needed and to prop patient on right side.

## 2013-03-15 NOTE — Progress Notes (Signed)
Patient has not experienced melena or rectal bleeding in over 4 days. He is receiving second unit of PRBCs. Serum haptoglobin is normal ruling out hemolysis. B12 and AFP levels are also normal.

## 2013-03-15 NOTE — Progress Notes (Signed)
[facility] PROGRESS NOTE  Timothy Chan XBJ:[id number] DOB: 03-14-1951 DOA: 03/05/2013 PCP: Timothy Specking., MD  Assessment/Plan: 1. Severe anemia, normocytic: Acute on chronic. Multifactorial including include acute blood loss anemia secondary to GI bleed, anemia critical illness, continued lab draws, end-stage renal disease. Status post 7 units packed red blood cells.  Continue PPI Carafate. Discussed with gastroenterology--transfused 2 additional units. There is no evidence of recurrent bleeding. Anticipate discharge 9/5. There is no evidence of hemolysis. 2. Abdominal pain: Resolved shortly after admission. No documentation to suggest recurrence. 3. 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 4. End-stage renal disease: Management per nephrology. Hemodialysis has been difficult secondary to hypotension but was successfully tolerated yesterday. 5. 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 History of TIPS procedure. Timothy Chan discussed wtih Timothy Chan patient's transplant hepatologist at [facility]. "Now that patient is not ambulatory he is not a candidate for hepatorenal transplant unless he improves and becomes ambulatory. He recommended office visit when patient is back on his feet and he should bring along his son on a family member " 6. Diabetes mellitus: Capillary blood sugars stable. 7. Noncompliance with hemodialysis, history of leaving AMA. 8. IV access: Patient had central line placed by general surgery. Patient is being considered for portacath placement as an outpatient. Can follow up with Timothy Chan. 9. Ongoing malnutrition  Timothy Chan and Dr. Kerry Chan met with patient and multiple family members to discuss further goals of care. It does not appear that patient is ready to accept hospice services. He wishes to continue with current treatments. Once he is stable, he will return to SNF   No evidence of recurrent bleeding. Transfuse 2  units packed red blood cells.  Despite transfer to skilled nursing facility 9/5  Pending studies:   None  Code Status: Full code DVT prophylaxis: SCDs Family Communication: None present Disposition Plan: To skilled nursing facility 9/5  Timothy Sacks, MD  [facility]  Pager [phone] If 7PM-7AM, please contact night-coverage at [email or web address], password [facility] 03/15/2013, 12:11 PM  LOS: 10 days   Summary: 62 year old man with complex medical history including recurrent GI bleed, esophageal varices, GAVE and other complications from alcoholic cirrhosis who presented to the emergency department with history of hematochezia and profound anemia. He was admitted for upper GI bleed, acute blood loss anemia, alcoholic liver cirrhosis with decompensation, deconditioning.  This patient was admitted to the hospital with hypotension and GI bleeding. He was admitted to the step down unit. He was seen by GI and has undergone 2 endoscopies during this hospitalization. 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 Anemia has been difficult to manage and patient has received a total of 7 units of prbc thus far. Hemodialysis has also been difficult since everytime ultrafiltration is attempted, patient becomes hypotensive. After extensive discussions with nephrology and GI, it was recommended to pursue a hospice approach to his care. At this time, it does not appear that the patient is willing to accept hospice care and wishes to continue with current treatments. Plan will be to recheck hemoglobin in AM and if it is stable, consider discharge back to SNF.  Consultants:  GI  Nephrology  Procedures:  Routine HD Transfusion total 7 units packed red blood cells EGD 8/27: Limited evaluation of upper GI tract secondary to large for debris and some coffee-ground material. Slow bleed or wheezing noted from telangiectasia at gastric antrum.he was treated with  argon  plasma coagulator with hemostasis another site with oozing and prepyloric area could not be seen because of food debris. Insertion of subclavian central line 8/29 Ultrasound guided paracentesis 8/29 removal 1080 mL EGD 8/29: Portal gastropathy. 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 Bleeding controlled with argon plasma coagulation.  Antibiotics: Cipro for SBP prophylaxis.  HPI/Subjective: Overall feeling fine. Complains of chronic left knee pain which has been present for several years now. Last injection into the knee by Dr. Ophelia Chan last perhaps a year ago. Otherwise no issues. No bleeding. Constipated. Tolerated dialysis yesterday.  Objective: Filed Vitals:   03/14/13 2217 03/14/13 2344 03/15/13 0357 03/15/13 0552  BP: 109/55 103/63  101/61  Pulse: 99 99  104  Temp: 98.7 F (37.1 C) 98.5 F (36.9 C)  98.5 F (36.9 C)  TempSrc: Oral Oral  Oral  Resp: 20 20  20   Height:      Weight:   108.2 kg (238 lb 8.6 oz)   SpO2: 97% 97%  99%    Intake/Output Summary (Last 24 hours) at 03/15/13 1211 Last data filed at 03/15/13 0800  Gross per 24 hour  Intake    240 ml  Output   2000 ml  Net  -1760 ml     Filed Weights   03/14/13 0915 03/14/13 1320 03/15/13 0357  Weight: 104.5 kg (230 lb 6.1 oz) 102.3 kg (225 lb 8.5 oz) 108.2 kg (238 lb 8.6 oz)    Exam:   Afebrile, vital signs stable.  General: Appears calm and comfortable. Appears chronically ill.  Psychiatric: Grossly normal mood and affect. Speech fluent and appropriate.  Cardiovascular: Regular rate and rhythm. No murmur, rub, gallop. 3+ bilateral lower extremity edema.  Musculoskeletal: The left knee has some tenderness medially. No definite effusion. No erythema or warmth. The left foot has some dry skin, sensation is grossly intact, dorsalis pedis 2+.  Respiratory: Clear to auscultation bilaterally. No wheezes, rales, rhonchi. Normal respiratory effort.  Abdomen: Soft, nontender,  nondistended  Skin: Appears grossly unremarkable.  Data Reviewed:  Capillary blood sugars stable  Basic metabolic panel consistent with end-stage renal disease  Hemoglobin mildly decreased  7.8 >> 7.2.  Scheduled Meds: . antiseptic oral rinse  15 mL Mouth Rinse q12n4p  . ciprofloxacin  500 mg Oral Daily  . feeding supplement (NEPRO CARB STEADY)  237 mL Oral BID BM  . folic acid  1 mg Oral QHS  . furosemide  80 mg Oral BID  . hydrocortisone valerate ointment  1 application Topical Q M,W,F  . insulin aspart  0-9 Units Subcutaneous Q6H  . metoCLOPramide  5 mg Oral TID AC & HS  . pantoprazole (PROTONIX) IV  40 mg Intravenous Q24H  . sevelamer carbonate  800 mg Oral TID WC  . sodium chloride  10 mL Intravenous Q12H  . sucralfate  1 g Oral QID   Continuous Infusions:   Principal Problem:   Acute blood loss anemia Active Problems:   Alcoholic cirrhosis/remote TIPS   Diabetes mellitus type 2, uncontrolled, with complications   GI bleed   Abdominal pain   Thrombocytopenia, unspecified   GAVE (gastric antral vascular ectasia)   ESRD on dialysis   Protein-calorie malnutrition, severe   Time spent 45 minutes

## 2013-03-15 NOTE — Clinical Social Work Note (Signed)
CSW spoke w admissions at [facility] to update on patient progress and anticipated discharge date.  Facility continues to be willing to accept patient at discharge and expressed no concerns.  Santa Genera, LCSW Clinical Social Worker [phone])

## 2013-03-15 NOTE — Progress Notes (Signed)
Pt requesting to speak with Dr. Karilyn Cota regarding a X-Ray of left knee. States "house doctor said it was just arthritis".  Pt wants Dr. Karilyn Cota paged this afternoon.  Will page.

## 2013-03-15 NOTE — Progress Notes (Signed)
Subjective: Interval History: has no complaint of nausea or vomiting. Presently patient claims  feeling better.. Complaints of weakness. Objective: Vital signs in last 24 hours: Temp:  [98 F (36.7 C)-98.7 F (37.1 C)] 98.5 F (36.9 C) (09/04 0552) Pulse Rate:  [98-130] 104 (09/04 0552) Resp:  [20] 20 (09/04 0552) BP: (87-127)/(42-66) 101/61 mmHg (09/04 0552) SpO2:  [96 %-99 %] 99 % (09/04 0552) Weight:  [102.3 kg (225 lb 8.5 oz)-108.2 kg (238 lb 8.6 oz)] 108.2 kg (238 lb 8.6 oz) (09/04 0357) Weight change:   Intake/Output from previous day: 09/03 0701 - 09/04 0700 In: 360 [P.O.:360] Out: 2000  Intake/Output this shift:    Generally he is alert and in no apparent distress. Chest decreased breath sound bilaterally. Heart exam revealed a regular rate and rhythm. Abdomen is full and nontender positive bowel sounds. Extremities has 2+ edema bilaterally.  Lab Results:  Recent Labs  03/14/13 0550 03/15/13 0544  WBC 6.0 6.1  HGB 7.8* 7.2*  HCT 23.1* 21.7*  PLT 104* 102*   BMET:   Recent Labs  03/13/13 0622 03/14/13 0550  NA 137 137  K 4.2 4.3  CL 103 101  CO2 32 31  GLUCOSE 100* 122*  BUN 25* 36*  CREATININE 3.56* 4.47*  CALCIUM 8.3* 8.6   No results found for this basename: PTH,  in the last 72 hours Iron Studies: No results found for this basename: IRON, TIBC, TRANSFERRIN, FERRITIN,  in the last 72 hours  Studies/Results: No results found.  I have reviewed the patient's current medications.  Assessment/Plan: Problem #1 end-stage renal disease . He status post hemodialysis yesterday. His BUN is 36 and creatinine still 3.56 with potassium of 4.47. Problem #2 anemia this is secondary to GI bleeding. His hemoglobin hematocrit has decreased today . His hemoglobin 7.2 and hematocrit is 21.7 patient has received multiple transfusions.. Problem #3 diabetes Problem #4 history of liver cirrhosis  Problem #5 metabolic bone disease his calcium and phosphorus is  stable. Problem #6 noncompliance with dialysis Problem #7 ttrombocytopenia Problem #8 hepatic encephalopathy. Patient is alert and answering questions appropriately. Problem #9 hypotension  Problem #10 CHF patient with increasing edema. Attempt to ultrafiltrate seems to be difficult because of reccurent hypotension . Plan for dialysis in am and possibly transfuse patient if hemoglobin decline below 7     LOS: 10 days   Tericka Devincenzi S 03/15/2013,8:14 AM

## 2013-03-16 ENCOUNTER — Inpatient Hospital Stay (HOSPITAL_COMMUNITY): Payer: Medicare Other

## 2013-03-16 LAB — GLUCOSE, CAPILLARY
Glucose-Capillary: 137 mg/dL — ABNORMAL HIGH (ref 70–99)
Glucose-Capillary: 116 mg/dL — ABNORMAL HIGH (ref 70–99)

## 2013-03-16 LAB — TYPE AND SCREEN
ABO/RH(D): B NEG
Unit division: 0
Unit division: 0

## 2013-03-16 LAB — BASIC METABOLIC PANEL
CO2: 28 mEq/L (ref 19–32)
Calcium: 8.9 mg/dL (ref 8.4–10.5)
Chloride: 100 mEq/L (ref 96–112)
Creatinine, Ser: 4.44 mg/dL — ABNORMAL HIGH (ref 0.50–1.35)
GFR calc Af Amer: 15 mL/min — ABNORMAL LOW (ref >90)
Sodium: 136 mEq/L (ref 135–145)

## 2013-03-16 LAB — CBC
HCT: 27.4 % — ABNORMAL LOW (ref 39.0–52.0)
MCHC: 33.6 g/dL (ref 30.0–36.0)
Platelets: 129 10*3/uL — ABNORMAL LOW (ref 150–400)
RDW: 16.6 % — ABNORMAL HIGH (ref 11.5–15.5)
WBC: 7.1 10*3/uL (ref 4.0–10.5)

## 2013-03-16 LAB — HEPATITIS B SURFACE ANTIGEN: Hepatitis B Surface Ag: NEGATIVE

## 2013-03-16 MED ORDER — ALBUMIN HUMAN 25 % IV SOLN
25.0000 g | Freq: Once | INTRAVENOUS | Status: AC
  Administered 2013-03-16: 25 g via INTRAVENOUS
  Filled 2013-03-16: qty 100

## 2013-03-16 MED ORDER — FERROUS SULFATE 325 (65 FE) MG PO TABS: 325.0000 mg | ORAL_TABLET | Freq: Two times a day (BID) | ORAL | Status: DC

## 2013-03-16 MED ORDER — METOCLOPRAMIDE HCL 5 MG PO TABS: 5.0000 mg | ORAL_TABLET | Freq: Three times a day (TID) | ORAL | Status: AC

## 2013-03-16 MED ORDER — PANTOPRAZOLE SODIUM 40 MG PO TBEC
40.0000 mg | DELAYED_RELEASE_TABLET | Freq: Two times a day (BID) | ORAL | Status: DC
  Administered 2013-03-16: 40 mg via ORAL

## 2013-03-16 MED ORDER — NEPRO/CARBSTEADY PO LIQD: 237.0000 mL | Freq: Two times a day (BID) | ORAL | Status: DC

## 2013-03-16 MED ORDER — ALTEPLASE 2 MG IJ SOLR
2.0000 mg | Freq: Once | INTRAMUSCULAR | Status: DC | PRN
  Filled 2013-03-16: qty 2

## 2013-03-16 MED ORDER — SODIUM CHLORIDE 0.9 % IV SOLN: 100.0000 mL | INTRAVENOUS | Status: DC | PRN

## 2013-03-16 MED ORDER — NEPRO/CARBSTEADY PO LIQD: 237.0000 mL | ORAL | Status: DC | PRN

## 2013-03-16 NOTE — Progress Notes (Signed)
Pt  Was discharged with instructions, prescriptions, and care notes.  Pt and his son was sent out with the packet with updates written on the discharge AVS.  I removed the the central line and the tip was in place.  Pressure was held to the site for 5 minutes. And instructions were written on the packet for the dressing to be there for 24 hours.  I also changed the dressing on his left side where he was leaking fluids.  The area was cleansed with soap and water per th e patients request.  The patient left the floor via w/c with staff in stable condition.

## 2013-03-16 NOTE — Plan of Care (Signed)
Problem: Phase I Progression Outcomes Goal: OOB as tolerated unless otherwise ordered Outcome: Not Met (add Reason) Pt is unable to tolerate getting out of bed.

## 2013-03-16 NOTE — Clinical Social Work Note (Signed)
Pt d/c today to [facility]. Pt, family, and facility aware. Pt requests that his son pick him up, and he is agreeable. D/C summary faxed.   Derenda Fennel, [place] [phone]

## 2013-03-16 NOTE — Progress Notes (Signed)
Subjective: Interval History: has no complaint of nausea or vomiting. Presently patient claims  feeling better.. Complaints of weakness. He complains of epigastric discomfort Objective: Vital signs in last 24 hours: Temp:  [97.7 F (36.5 C)-99.2 F (37.3 C)] 98.6 F (37 C) (09/05 0509) Pulse Rate:  [97-115] 115 (09/05 0509) Resp:  [18-20] 20 (09/05 0509) BP: (92-120)/(48-66) 120/66 mmHg (09/05 0509) SpO2:  [94 %-100 %] 96 % (09/05 0509) Weight:  [103.3 kg (227 lb 11.8 oz)] 103.3 kg (227 lb 11.8 oz) (09/05 0509) Weight change: -1.2 kg (-2 lb 10.3 oz)  Intake/Output from previous day: 09/04 0701 - 09/05 0700 In: 1110 [P.O.:360; I.V.:100; Blood:650] Out: -  Intake/Output this shift:    Generally he is alert and in no apparent distress. Chest decreased breath sound bilaterally. Heart exam revealed a regular rate and rhythm. Abdomen is full and nontender positive bowel sounds. Extremities has 2+ edema bilaterally.  Lab Results:  Recent Labs  03/14/13 0550 03/15/13 0544  WBC 6.0 6.1  HGB 7.8* 7.2*  HCT 23.1* 21.7*  PLT 104* 102*   BMET:   Recent Labs  03/14/13 0550 03/16/13 0448  NA 137 136  K 4.3 4.1  CL 101 100  CO2 31 28  GLUCOSE 122* 143*  BUN 36* 34*  CREATININE 4.47* 4.44*  CALCIUM 8.6 8.9   No results found for this basename: PTH,  in the last 72 hours Iron Studies: No results found for this basename: IRON, TIBC, TRANSFERRIN, FERRITIN,  in the last 72 hours  Studies/Results: No results found.  I have reviewed the patient's current medications.  Assessment/Plan: Problem #1 end-stage renal disease . He status post hemodialysis the day before  yesterday. His BUN is 34 and creatinine still 4.44 with potassium of 4.1. Problem #2 anemia this is secondary to GI bleeding. His hemoglobin hematocrit has decreased today . His hemoglobin 7.2 and hematocrit is 21.7 patient has received multiple transfusions.. Problem #3 diabetes Problem #4 history of liver  cirrhosis  Problem #5 metabolic bone disease his calcium and phosphorus is stable. Problem #6 noncompliance with dialysis Problem #7 ttrombocytopenia Problem #8 hepatic encephalopathy. Patient is alert and answering questions appropriately. Problem #9 hypotension  Problem #10 Anasarca . Attempt to ultrafiltrate seems to be difficult because of reccurent hypotension . Plan for dialysis today and we will continue with low temp. Dialysate and albumin to see if we can remove more fluid today with dialysis     LOS: 11 days   Trevin Gartrell S 03/16/2013,7:29 AM

## 2013-03-16 NOTE — Progress Notes (Signed)
Report given to Martie Lee, LPN of [place].  She verbalized understanding.  I voiced to her that she could call me with questions up to [phone].

## 2013-03-16 NOTE — Discharge Summary (Addendum)
Physician Discharge Summary  Timothy Chan ZOX:[id number] DOB: 06-03-1951 DOA: 03/05/2013  PCP: Ignatius Specking., MD  Admit date: 03/05/2013 Discharge date: 03/16/2013  If the patient presents again with bleeding, transfer to a tertiary care center should be considered given his complex history and desire for aggressive care  Recommendations for Outpatient Follow-up:  1. Severe normocytic anemia, monitor CBC weekly 2. History recurrent GI bleed, monitor blood count closely 3. Left knee pain, suspected osteoarthritis, consider outpatient orthopedics referral 4. Continue dialysis for end-stage renal disease 5. Alcoholic liver disease, consider followup with hepatologist as outpatient, see below 6. Patient needs vigorous physical therapy in order to be a candidate for hepatorenal transplant 7. Outpatient followup with Dr. Leticia Penna for consideration of Port-A-Cath implantation 8. F/u with hepatologist at [facility] if condition improves  Follow-up Information   Follow up with VYAS,DHRUV B., MD In 1 week.   Specialty:  Internal Medicine   Contact information:   [address] [place] [address] [phone]       Follow up with Fabio Bering, MD In 2 weeks.   Specialty:  General Surgery   Contact information:   [address] [place] [address] [phone]      Discharge Diagnoses:  1. Severe normocytic anemia acute on chronic, multifactorial including acute blood loss anemia, anemia and critical illness, end-stage renal disease, frequent lab draws 2. 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 History of TIPS procedure.  4. End-stage renal disease on hemodialysis  Discharge Condition: Improved but long-term prognosis is poor Disposition: Return to skilled nursing facility  Diet recommendation: Low salt diabetic diet  Filed Weights   03/14/13 1320 03/15/13 0357  03/16/13 0509  Weight: 102.3 kg (225 lb 8.5 oz) 108.2 kg (238 lb 8.6 oz) 103.3 kg (227 lb 11.8 oz)    History of present illness:  62 year old man with complex medical history including recurrent GI bleed, esophageal varices, GAVE and other complications from alcoholic cirrhosis who presented to the emergency department with history of hematochezia and profound anemia. He was admitted for upper GI bleed, acute blood loss anemia, alcoholic liver cirrhosis with decompensation, deconditioning.  Hospital Course:  Patient was seen in consultation with gastroenterology, he underwent EGD twice and GAVE again was seen and underwent APC therapy to treat multiple bleeding sites. He has had no further bleeding after the second EGD and treatment. His anemia was multifactorial as outlined below and eventually stabilized after 9 units packed red blood cells. Hospitalization complicated by difficulty with hemodialysis secondary to hypotension. After extensive discussions with nephrology and GI, it was recommended to pursue a hospice approach to his care. The patient declined this and wishes to continue current treatments. Discussed with Dr. Karilyn Cota 9/5, given stable hgb, patient stable for discharge. Currently the patient is not a candidate for transplant as outlined below. The patient reports to me that he has not walked significantly in 4 years. He has also had chronic left knee pain for 6 or more months. He has had a steroid injection in the past.  1. Severe anemia, normocytic: Acute on chronic. Multifactorial including include acute blood loss anemia secondary to GI bleed, anemia critical illness, continued lab draws, end-stage renal disease. Status post 9 units packed red blood cells. Continue PPI Carafate. There is no evidence of recurrent bleeding. There is no evidence of hemolysis. 2. Abdominal pain: Resolved shortly after admission. No documentation to suggest recurrence. 3. Status  post upper GI bleed secondary  to GAVE 8/11. 4. Left knee pain: Suspect osteoarthritis, no previous films available. No signs or symptoms of infection or acute pathology. 5. End-stage renal disease: Management per nephrology. Hemodialysis has been difficult secondary to hypotension but was successfully tolerated on last treatment. 6. 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 History of TIPS procedure. Dr. Karilyn Cota discussed wtih Dr. Pollyann Samples patient's transplant hepatologist at [facility]. "Now that patient is not ambulatory he is not a candidate for hepatorenal transplant unless he improves and becomes ambulatory. He recommended office visit when patient is back on his feet and he should bring along his son on a family member " 7. Diabetes mellitus: Capillary blood sugars stable. 8. IV access: Patient had central line placed by general surgery. Patient is being considered for portacath placement as an outpatient. Can follow up with Dr. Leticia Penna. 9. Ongoing malnutrition  Consultants:  GI  Nephrology  Procedures:  Routine HD  Transfusion total 9 units packed red blood cells  EGD 8/27: Limited evaluation of upper GI tract secondary to large for debris and some coffee-ground material. Slow bleed or wheezing noted from telangiectasia at gastric antrum.he was treated with argon plasma coagulator with hemostasis another site with oozing and prepyloric area could not be seen because of food debris.  Insertion of subclavian central line 8/29  Ultrasound guided paracentesis 8/29 removal 1080 mL  EGD 8/29: Portal gastropathy. 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 Bleeding controlled with argon plasma coagulation. Antibiotics: Cipro for SBP prophylaxis.  Discharge Instructions     Medication List         ciprofloxacin 500 MG tablet  Commonly known as:  CIPRO  Take 1 tablet (500 mg total) by mouth daily.     feeding supplement (NEPRO CARB STEADY) Liqd   Take 237 mLs by mouth as needed (missed meal during dialysis.).     feeding supplement (NEPRO CARB STEADY) Liqd  Take 237 mLs by mouth 2 (two) times daily between meals.     ferrous sulfate 325 (65 FE) MG tablet  Take 1 tablet (325 mg total) by mouth 2 (two) times daily with a meal.     Folic Acid 0.8 MG Caps  Take 1 capsule by mouth at bedtime.     furosemide 80 MG tablet  Commonly known as:  LASIX  Take 80 mg by mouth 2 (two) times daily.     lactulose 10 GM/15ML solution  Commonly known as:  CHRONULAC  Take 20 g by mouth daily as needed (for constipation).     metoCLOPramide 5 MG tablet  Commonly known as:  REGLAN  Take 1 tablet (5 mg total) by mouth 4 (four) times daily -  before meals and at bedtime.     ondansetron 4 MG tablet  Commonly known as:  ZOFRAN  Take 4 mg by mouth daily as needed for nausea.     pantoprazole 40 MG tablet  Commonly known as:  PROTONIX  Take 1 tablet (40 mg total) by mouth 2 (two) times daily before a meal.     sevelamer carbonate 800 MG tablet  Commonly known as:  RENVELA  Take 2 tablets (1,600 mg total) by mouth 3 (three) times daily with meals.     sodium phosphate enema  Commonly known as:  FLEET  Place 1 enema rectally once. follow package directions     sucralfate 1 GM/10ML suspension  Commonly known as:  CARAFATE  Take 10 mLs (1 g total) by mouth 4 (four) times daily.     traMADol 50 MG tablet  Commonly known as:  ULTRAM  Take 50 mg by mouth 3 (three) times daily as needed for pain.     WESTCORT 0.2 %  Generic drug:  hydrocortisone valerate ointment  Apply 1 application topically every Monday, Wednesday, and Friday. For numbing prior to dialysis       Allergies  Allergen Reactions  . Tylenol [Acetaminophen] Other (See Comments)    cirrhosis    The results of significant diagnostics from this hospitalization (including imaging, microbiology, ancillary and laboratory) are listed below for reference.    Significant  Diagnostic Studies:   US Paracentesis  03/09/2013   *RADIOLOGY REPORT*  ULTRASOUND GUIDED PARACENTESIS:  Clinical Data:  Cirrhosis, ascites  Technique: After explanation of procedure, benefits, and risks, written informed consent was obtained. Time-out protocol was followed. Collection of ascites in left lower quadrant localized by ultrasound. Skin prepped and draped in usual sterile fashion. Skin and soft tissues anesthestized with 9 ml of 1% lidocaine. 5-French Yueh catheter placed into peritoneal cavity. 1080 ml of yellow fluid aspirated by vacuum bottle suction. Procedure tolerated well by patient without immediate complication.  IMPRESSION: Ultrasound-guided paracentesis of 1080 ml of ascitic fluid. Fluid sent to laboratory for requested analysis.   Original Report Authenticated By: Ulyses Southward, M.D.    Microbiology: Recent Results (from the past 240 hour(s))  CULTURE, BODY FLUID-BOTTLE     Status: None   Collection Time    03/09/13  2:07 PM      Result Value Range Status   Specimen Description FLUID ASCITIC COLLECTED BY DOCTOR BOLES   Final   Special Requests BOTTLES DRAWN AEROBIC AND ANAEROBIC 10CC   Final   Culture NO GROWTH 5 DAYS   Final   Report Status 03/14/2013 FINAL   Final  GRAM STAIN     Status: None   Collection Time    03/09/13  2:12 PM      Result Value Range Status   Specimen Description ASCITIC   Final   Special Requests NONE   Final   Gram Stain     Final   Value: NO ORGANISMS SEEN     FEW WBC SEEN   Report Status 03/09/2013 FINAL   Final     Labs: Basic Metabolic Panel:  Recent Labs Lab 03/11/13 0522 03/12/13 0515 03/13/13 0622 03/14/13 0550 03/16/13 0448  NA 131* 133* 137 137 136  K 3.6 3.8 4.2 4.3 4.1  CL 97 99 103 101 100  CO2 31 30 32 31 28  GLUCOSE 132* 175* 100* 122* 143*  BUN 22 31* 25* 36* 34*  CREATININE 3.86* 4.72* 3.56* 4.47* 4.44*  CALCIUM 8.1* 8.4 8.3* 8.6 8.9  PHOS  --  2.3  --   --   --    CBC:  Recent Labs Lab 03/12/13 0515  03/12/13 1745 03/13/13 0622 03/14/13 0550 03/15/13 0544 03/16/13 1107  WBC 5.6  --  6.2 6.0 6.1 7.1  HGB 7.2* 6.3* 8.2* 7.8* 7.2* 9.2*  HCT 21.9* 19.3* 24.5* 23.1* 21.7* 27.4*  MCV 92.8  --  89.7 90.6 91.2 90.1  PLT 84*  --  86* 104* 102* 129*    Recent Labs  11/21/12 1145  PROBNP 1167.0*   CBG:  Recent Labs Lab 03/15/13 0550 03/15/13 1115 03/15/13 1659 03/15/13 2212 03/16/13 0740  GLUCAP 107* 154* 159* 139* 137*    Principal Problem:  Acute blood loss anemia Active Problems:   Alcoholic cirrhosis/remote TIPS   Diabetes mellitus type 2, uncontrolled, with complications   GI bleed   Abdominal pain   Thrombocytopenia, unspecified   GAVE (gastric antral vascular ectasia)   ESRD on dialysis   Protein-calorie malnutrition, severe   Time coordinating discharge: 60 minutes  Signed:  Brendia Sacks, MD [facility] 03/16/2013, 3:08 PM

## 2013-03-16 NOTE — Progress Notes (Signed)
[facility] PROGRESS NOTE  Timothy Chan:[id number] DOB: 11-Aug-1950 DOA: 03/05/2013 PCP: Ignatius Specking., MD  Assessment/Plan: 1. Severe anemia, normocytic: Acute on chronic. Multifactorial including include acute blood loss anemia secondary to GI bleed, anemia critical illness, continued lab draws, end-stage renal disease. Status post 9 units packed red blood cells.  Continue PPI Carafate. There is no evidence of recurrent bleeding.  There is no evidence of hemolysis. 2. Abdominal pain: Resolved shortly after admission. No documentation to suggest recurrence. 3. 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 4. Left knee pain: Suspect arthritis presumed previous films available. Check plain films. No signs or symptoms of infection or acute pathology. 5. End-stage renal disease: Management per nephrology. Hemodialysis has been difficult secondary to hypotension but was successfully tolerated on last treatment. 6. 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 History of TIPS procedure. Dr. Karilyn Cota discussed wtih Dr. Pollyann Samples patient's transplant hepatologist at [facility]. "Now that patient is not ambulatory he is not a candidate for hepatorenal transplant unless he improves and becomes ambulatory. He recommended office visit when patient is back on his feet and he should bring along his son on a family member " 7. Diabetes mellitus: Capillary blood sugars stable. 8. Noncompliance with hemodialysis, history of leaving AMA. 9. IV access: Patient had central line placed by general surgery. Patient is being considered for portacath placement as an outpatient. Can follow up with Dr. Leticia Penna. 10. Ongoing malnutrition  Dr. Karilyn Cota and Dr. Kerry Hough met with patient and multiple family members to discuss further goals of care. It does not appear that patient is ready to accept hospice services. He wishes to continue with current treatments. Once he is stable, he will  return to SNF   No evidence of recurrent bleeding. Hemoglobin improved status post transfusion.  Will check left knee x-ray, suspect arthritis  Plan transfer to skilled nursing facility today  Remove central line prior transfer  Consider Port-A-Cath placement as an outpatient, followup with Dr. Leticia Penna  Pending studies:   None  Code Status: Full code DVT prophylaxis: SCDs Family Communication: None present Disposition Plan: To skilled nursing facility 9/5  Brendia Sacks, MD  [facility]  Pager [phone] If 7PM-7AM, please contact night-coverage at [email or web address], password [facility] 03/16/2013, 2:24 PM  LOS: 11 days   Summary: 62 year old man with complex medical history including recurrent GI bleed, esophageal varices, GAVE and other complications from alcoholic cirrhosis who presented to the emergency department with history of hematochezia and profound anemia. He was admitted for upper GI bleed, acute blood loss anemia, alcoholic liver cirrhosis with decompensation, deconditioning.  This patient was admitted to the hospital with hypotension and GI bleeding. He was admitted to the step down unit. He was seen by GI and has undergone 2 endoscopies during this hospitalization. 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 Anemia has been difficult to manage and patient has received a total of 7 units of prbc thus far. Hemodialysis has also been difficult since everytime ultrafiltration is attempted, patient becomes hypotensive. After extensive discussions with nephrology and GI, it was recommended to pursue a hospice approach to his care. At this time, it does not appear that the patient is willing to accept hospice care and wishes to continue with current treatments. Plan will be to recheck hemoglobin in AM and if it is stable, consider discharge back to SNF.  Consultants:  GI  Nephrology  Procedures:  Routine  HD Transfusion total 9  units packed red blood cells EGD 8/27: Limited evaluation of upper GI tract secondary to large for debris and some coffee-ground material. Slow bleed or wheezing noted from telangiectasia at gastric antrum.he was treated with argon plasma coagulator with hemostasis another site with oozing and prepyloric area could not be seen because of food debris. Insertion of subclavian central line 8/29 Ultrasound guided paracentesis 8/29 removal 1080 mL EGD 8/29: Portal gastropathy. 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 Bleeding controlled with argon plasma coagulation.  Antibiotics: Cipro for SBP prophylaxis.  HPI/Subjective: Overall feels about the same. Multiple minor complaints mostly about the food. Left knee pain, again he reports has been present 6 or more months. No lower leg pain or foot pain. Complains of some reflux symptoms. One small bowel movement.  Objective: Filed Vitals:   03/16/13 1230 03/16/13 1300 03/16/13 1330 03/16/13 1400  BP: 121/54 109/56 104/43 99/55  Pulse: 117 120 120 121  Temp:      TempSrc:      Resp:      Height:      Weight:      SpO2:        Intake/Output Summary (Last 24 hours) at 03/16/13 1424 Last data filed at 03/15/13 1838  Gross per 24 hour  Intake  557.5 ml  Output      0 ml  Net  557.5 ml     Filed Weights   03/14/13 1320 03/15/13 0357 03/16/13 0509  Weight: 102.3 kg (225 lb 8.5 oz) 108.2 kg (238 lb 8.6 oz) 103.3 kg (227 lb 11.8 oz)    Exam:   General: Appears calm and comfortable.  Psychiatric: Grossly normal mood and affect. Speech fluent and appropriate.  Cardiovascular: Regular rate and rhythm. No murmur, rub, gallop. No lower extremity edema.   Respiratory: Clear to auscultation bilaterally. No wheezes, rales, rhonchi. Normal respiratory effort.  Abdomen: Soft, nontender, nondistended  Skin: Bilateral lower extremities dry. No lesions seen.  Musculoskeletal: Able to lift the right leg off the bed  in the left leg off the bed albeit just a little bit. Excellent foot strength. Both feet are warm and dry, nontender. Brisk capillary refill is noted. The left knee appears edematous but in keeping with the appearance of the right knee. I do not appreciate an effusion. There was some mild tenderness along the medial joint line. There is no erythema or warmth. He is able to bend and extend the knee somewhat.  Data Reviewed:  Capillary blood sugars stable  Basic metabolic panel consistent with end-stage renal disease  Hemoglobin improved 9.2  Scheduled Meds: . antiseptic oral rinse  15 mL Mouth Rinse q12n4p  . ciprofloxacin  500 mg Oral Daily  . feeding supplement (NEPRO CARB STEADY)  237 mL Oral BID BM  . folic acid  1 mg Oral QHS  . furosemide  80 mg Oral BID  . hydrocortisone valerate ointment  1 application Topical Q M,W,F  . insulin aspart  0-9 Units Subcutaneous TID AC & HS  . lactulose  20 g Oral Daily  . metoCLOPramide  5 mg Oral TID AC & HS  . pantoprazole  40 mg Oral BID  . sevelamer carbonate  800 mg Oral TID WC  . sodium chloride  10 mL Intravenous Q12H  . sucralfate  1 g Oral QID   Continuous Infusions:   Principal Problem:   Acute blood loss anemia Active Problems:   Alcoholic cirrhosis/remote TIPS  Diabetes mellitus type 2, uncontrolled, with complications   GI bleed   Abdominal pain   Thrombocytopenia, unspecified   GAVE (gastric antral vascular ectasia)   ESRD on dialysis   Protein-calorie malnutrition, severe

## 2013-03-18 ENCOUNTER — Emergency Department (HOSPITAL_COMMUNITY): Payer: Medicare Other

## 2013-03-18 ENCOUNTER — Encounter (HOSPITAL_COMMUNITY): Payer: Self-pay

## 2013-03-18 ENCOUNTER — Inpatient Hospital Stay (HOSPITAL_COMMUNITY)
Admission: EM | Admit: 2013-03-18 | Discharge: 2013-03-19 | DRG: 377 | Disposition: A | Discharge status: Other Acute Inpatient Hospital | Payer: Medicare Other | Attending: Internal Medicine | Admitting: Internal Medicine

## 2013-03-18 DIAGNOSIS — L89109 Pressure ulcer of unspecified part of back, unspecified stage: Secondary | ICD-10-CM | POA: Diagnosis present

## 2013-03-18 DIAGNOSIS — I8510 Secondary esophageal varices without bleeding: Secondary | ICD-10-CM | POA: Diagnosis present

## 2013-03-18 DIAGNOSIS — Z79899 Other long term (current) drug therapy: Secondary | ICD-10-CM

## 2013-03-18 DIAGNOSIS — L8992 Pressure ulcer of unspecified site, stage 2: Secondary | ICD-10-CM | POA: Diagnosis present

## 2013-03-18 DIAGNOSIS — K31819 Angiodysplasia of stomach and duodenum without bleeding: Secondary | ICD-10-CM | POA: Diagnosis present

## 2013-03-18 DIAGNOSIS — IMO0002 Reserved for concepts with insufficient information to code with codable children: Secondary | ICD-10-CM | POA: Diagnosis present

## 2013-03-18 DIAGNOSIS — N186 End stage renal disease: Secondary | ICD-10-CM

## 2013-03-18 DIAGNOSIS — D62 Acute posthemorrhagic anemia: Secondary | ICD-10-CM

## 2013-03-18 DIAGNOSIS — K7030 Alcoholic cirrhosis of liver without ascites: Secondary | ICD-10-CM

## 2013-03-18 DIAGNOSIS — Z9181 History of falling: Secondary | ICD-10-CM

## 2013-03-18 DIAGNOSIS — Z992 Dependence on renal dialysis: Secondary | ICD-10-CM

## 2013-03-18 DIAGNOSIS — I120 Hypertensive chronic kidney disease with stage 5 chronic kidney disease or end stage renal disease: Secondary | ICD-10-CM | POA: Diagnosis present

## 2013-03-18 DIAGNOSIS — K922 Gastrointestinal hemorrhage, unspecified: Secondary | ICD-10-CM

## 2013-03-18 DIAGNOSIS — F1020 Alcohol dependence, uncomplicated: Secondary | ICD-10-CM | POA: Diagnosis present

## 2013-03-18 DIAGNOSIS — L89152 Pressure ulcer of sacral region, stage 2: Secondary | ICD-10-CM | POA: Diagnosis present

## 2013-03-18 DIAGNOSIS — D649 Anemia, unspecified: Secondary | ICD-10-CM

## 2013-03-18 DIAGNOSIS — K31811 Angiodysplasia of stomach and duodenum with bleeding: Principal | ICD-10-CM | POA: Diagnosis present

## 2013-03-18 DIAGNOSIS — Z8701 Personal history of pneumonia (recurrent): Secondary | ICD-10-CM

## 2013-03-18 DIAGNOSIS — Z87891 Personal history of nicotine dependence: Secondary | ICD-10-CM

## 2013-03-18 DIAGNOSIS — I959 Hypotension, unspecified: Secondary | ICD-10-CM | POA: Diagnosis present

## 2013-03-18 DIAGNOSIS — R109 Unspecified abdominal pain: Secondary | ICD-10-CM | POA: Diagnosis present

## 2013-03-18 DIAGNOSIS — E1165 Type 2 diabetes mellitus with hyperglycemia: Secondary | ICD-10-CM | POA: Diagnosis present

## 2013-03-18 HISTORY — DX: End stage renal disease: N18.6

## 2013-03-18 HISTORY — DX: Nevus, non-neoplastic: I78.1

## 2013-03-18 HISTORY — DX: End stage renal disease: Z99.2

## 2013-03-18 LAB — CBC WITH DIFFERENTIAL/PLATELET
Basophils Absolute: 0.1 10*3/uL (ref 0.0–0.1)
Basophils Relative: 1 % (ref 0–1)
Eosinophils Absolute: 0.3 10*3/uL (ref 0.0–0.7)
MCH: 30.7 pg (ref 26.0–34.0)
MCHC: 33.6 g/dL (ref 30.0–36.0)
Monocytes Relative: 9 % (ref 3–12)
Neutro Abs: 5.8 10*3/uL (ref 1.7–7.7)
Neutrophils Relative %: 74 % (ref 43–77)
Platelets: 153 10*3/uL (ref 150–400)
RDW: 16.9 % — ABNORMAL HIGH (ref 11.5–15.5)

## 2013-03-18 LAB — TROPONIN I: Troponin I: <0.3 ng/mL (ref <0.30)

## 2013-03-18 LAB — COMPREHENSIVE METABOLIC PANEL
AST: 38 U/L — ABNORMAL HIGH (ref 0–37)
Albumin: 1.8 g/dL — ABNORMAL LOW (ref 3.5–5.2)
Alkaline Phosphatase: 120 U/L — ABNORMAL HIGH (ref 39–117)
BUN: 45 mg/dL — ABNORMAL HIGH (ref 6–23)
Chloride: 97 mEq/L (ref 96–112)
Creatinine, Ser: 4.94 mg/dL — ABNORMAL HIGH (ref 0.50–1.35)
Potassium: 4.3 mEq/L (ref 3.5–5.1)
Total Bilirubin: 1.4 mg/dL — ABNORMAL HIGH (ref 0.3–1.2)
Total Protein: 5.8 g/dL — ABNORMAL LOW (ref 6.0–8.3)

## 2013-03-18 LAB — LIPASE, BLOOD: Lipase: 32 U/L (ref 11–59)

## 2013-03-18 LAB — APTT: aPTT: 37 seconds (ref 24–37)

## 2013-03-18 LAB — PROTIME-INR: Prothrombin Time: 15.9 seconds — ABNORMAL HIGH (ref 11.6–15.2)

## 2013-03-18 MED ORDER — SODIUM CHLORIDE 0.9 % IV SOLN
INTRAVENOUS | Status: DC
  Administered 2013-03-18: 20:00:00 via INTRAVENOUS

## 2013-03-18 MED ORDER — SODIUM CHLORIDE 0.9 % IV SOLN: INTRAVENOUS | Status: AC

## 2013-03-18 MED ORDER — ONDANSETRON HCL 4 MG/2ML IJ SOLN: 4.0000 mg | Freq: Three times a day (TID) | INTRAMUSCULAR | Status: DC | PRN

## 2013-03-18 MED ORDER — SODIUM CHLORIDE 0.9 % IV SOLN
8.0000 mg/h | INTRAVENOUS | Status: DC
  Administered 2013-03-18: 8 mg/h via INTRAVENOUS
  Filled 2013-03-18 (×5): qty 80

## 2013-03-18 MED ORDER — SODIUM CHLORIDE 0.9 % IV SOLN: 80.0000 mg | Freq: Once | INTRAVENOUS | Status: DC

## 2013-03-18 MED ORDER — SODIUM CHLORIDE 0.9 % IV SOLN
8.0000 mg/h | INTRAVENOUS | Status: DC
  Filled 2013-03-18 (×4): qty 80

## 2013-03-18 MED ORDER — PANTOPRAZOLE SODIUM 40 MG IV SOLR
INTRAVENOUS | Status: AC
  Filled 2013-03-18: qty 80

## 2013-03-18 MED ORDER — SODIUM CHLORIDE 0.9 % IV SOLN
80.0000 mg | Freq: Once | INTRAVENOUS | Status: AC
  Administered 2013-03-18: 21:00:00 80 mg via INTRAVENOUS
  Filled 2013-03-18: qty 80

## 2013-03-18 NOTE — H&P (Signed)
[facility] History and Physical  Timothy Chan  ZOX:[id number]  DOB: Mar 20, 1951   DOA: 03/18/2013   PCP:   Ignatius Specking., MD   Chief Complaint:  Vomited bright red blood with clots today  HPI: Timothy Chan is a 62 y.o. male.   Resident of [facility] skilled nursing facility; alcoholic liver disease status post TIPS, end-stage renal disease on Monday Wednesday Friday hemodialysis, sent to the emergency room because of an episode of vomiting bright red blood with clots, associated with lightheadedness with standing  This gentleman is well-known to the hospitalist service at [facility] for recurrent episodes of GI bleed, was in fact discharged just 2 days ago after receiving 9 units of packed red cells over his hospital course. Please refer to the discharge summary of 03/16/2013. He reports chronic melena for the past 6-8  Rewiew of Systems:   All systems negative except as marked bold or noted in the HPI;  Constitutional:    malaise, fever and chills. ;  Eyes:   eye pain, redness and discharge. ;  ENMT:   ear pain, hoarseness, nasal congestion, sinus pressure and sore throat. ;  Cardiovascular:    chest pain, palpitations, diaphoresis, dyspnea and peripheral edema.  Respiratory:   cough, hemoptysis, wheezing and stridor. ;  Gastrointestinal:   Diarrhea 3-4 /black x 6-8 weeks, constipation, abdominal pain, melena,  hematemesis, jaundice and rectal bleeding. unusual weight loss..   Genitourinary:    frequency, dysuria, incontinence,flank pain and hematuria; Musculoskeletal:   back pain and neck pain.  swelling and trauma.;  Skin: .  pruritus, rash, abrasions, bruising and skin lesion.; ulcerations Neuro:    headache, lightheadedness and neck stiffness.  weakness, altered level of consciousness, altered mental status, extremity weakness, burning feet, involuntary movement, seizure and syncope.  Psych:    anxiety, depression, insomnia, tearfulness, panic attacks, hallucinations,  paranoia, suicidal or homicidal ideation    Past Medical History  Diagnosis Date  . Rectal bleeding   . Dialysis care   . GI bleed   . Hypertension   . Esophageal varices   . Alcoholic liver disease   . Hepatic encephalopathy   . Ascites   . Diabetes mellitus   . Detached retina   . Pneumonia   . 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 ESRD on hemodialysis     Past Surgical History  Procedure Laterality Date  . Esophagogastroduodenoscopy  12/31/2010    EGD [facility] THERAPY  . Esophagogastroduodenoscopy  05/31/2012E    GD [facility] ABLATION  . Small bowel givens  12/01/2010  . Colonoscopy  09/07/2010  . Esophagogastroduodenoscopy  09/05/2010    OUTLAW  . Lung surgery  6/11    [place]  . Esophagogastroduodenoscopy  03/26/2011    Procedure: ESOPHAGOGASTRODUODENOSCOPY (EGD);  Surgeon: Malissa Hippo, MD;  [facility];  Service: Endoscopy;  Laterality: N/A;  8:30 am  . Hot hemostasis  03/26/2011    Procedure: HOT HEMOSTASIS (ARGON PLASMA COAGULATION/BICAP);  Surgeon: Malissa Hippo, MD;  [facility];  Service: Endoscopy;  Laterality: N/A;  . Stent in liver    . Esophagogastroduodenoscopy N/A 10/24/2012    Procedure: ESOPHAGOGASTRODUODENOSCOPY (EGD);  Surgeon: Malissa Hippo, MD;  [facility];  Service: Endoscopy;  Laterality: N/A;  . Esophagogastroduodenoscopy N/A 11/22/2012    Procedure: ESOPHAGOGASTRODUODENOSCOPY (EGD);  Surgeon: Charna Elizabeth, MD;  Location: [facility] ENDOSCOPY;  Service: Endoscopy;  Laterality: N/A;  . Paracentesis    .  Esophagogastroduodenoscopy N/A 12/21/2012    Procedure: ESOPHAGOGASTRODUODENOSCOPY (EGD);  Surgeon: Malissa Hippo, MD;  [facility];  Service: Endoscopy;  Laterality: N/A;  250-moved to 155 Ann to notify pt  . Hot hemostasis N/A 12/21/2012    Procedure: HOT HEMOSTASIS (ARGON PLASMA COAGULATION/BICAP);  Surgeon: Malissa Hippo, MD;  [facility];  Service: Endoscopy;   Laterality: N/A;  . Esophagogastroduodenoscopy N/A 01/25/2013    Procedure: ESOPHAGOGASTRODUODENOSCOPY (EGD);  Surgeon: Malissa Hippo, MD;  [facility];  Service: Endoscopy;  Laterality: N/A;  100  . Hot hemostasis N/A 01/25/2013    Procedure: HOT HEMOSTASIS (ARGON PLASMA COAGULATION/BICAP);  Surgeon: Malissa Hippo, MD;  [facility];  Service: Endoscopy;  Laterality: N/A;  . Esophagogastroduodenoscopy N/A 02/16/2013    Procedure: ESOPHAGOGASTRODUODENOSCOPY (EGD);  Surgeon: Malissa Hippo, MD;  [facility];  Service: Endoscopy;  Laterality: N/A;  . Esophagogastroduodenoscopy N/A 03/07/2013    Procedure: ESOPHAGOGASTRODUODENOSCOPY (EGD);  Surgeon: Malissa Hippo, MD;  [facility];  Service: Endoscopy;  Laterality: N/A;  . Esophagogastroduodenoscopy N/A 03/09/2013    Procedure: ESOPHAGOGASTRODUODENOSCOPY (EGD);  Surgeon: Malissa Hippo, MD;  [facility];  Service: Endoscopy;  Laterality: N/A;    Medications:  HOME MEDS: Prior to Admission medications   Medication Sig Start Date End Date Taking? Authorizing Provider  ciprofloxacin (CIPRO) 500 MG tablet Take 1 tablet (500 mg total) by mouth daily. 02/27/13  Yes Richarda Overlie, MD  ferrous sulfate 325 (65 FE) MG tablet Take 1 tablet (325 mg total) by mouth 2 (two) times daily with a meal. 03/16/13  Yes Standley Brooking, MD  Folic Acid 0.8 MG CAPS Take 1 capsule by mouth at bedtime.   Yes Historical Provider, MD  furosemide (LASIX) 80 MG tablet Take 80 mg by mouth 2 (two) times daily.   Yes Historical Provider, MD  lactulose (CHRONULAC) 10 GM/15ML solution Take 20 g by mouth daily.  11/25/12  Yes Christiane Ha, MD  metoCLOPramide (REGLAN) 5 MG tablet Take 1 tablet (5 mg total) by mouth 4 (four) times daily -  before meals and at bedtime. 03/16/13  Yes Standley Brooking, MD  pantoprazole (PROTONIX) 40 MG tablet Take 1 tablet (40 mg total) by mouth 2 (two) times daily before a meal. 02/27/13   Yes Richarda Overlie, MD  sevelamer carbonate (RENVELA) 800 MG tablet Take 2 tablets (1,600 mg total) by mouth 3 (three) times daily with meals. 02/27/13  Yes Richarda Overlie, MD  sucralfate (CARAFATE) 1 GM/10ML suspension Take 10 mLs (1 g total) by mouth 4 (four) times daily. 01/15/13  Yes Flint Melter, MD  traMADol (ULTRAM) 50 MG tablet Take 50 mg by mouth 3 (three) times daily as needed for pain.    Yes Historical Provider, MD  hydrocortisone valerate ointment (WESTCORT) 0.2 % Apply 1 application topically every Monday, Wednesday, and Friday. For numbing prior to dialysis    Historical Provider, MD  Nutritional Supplements (FEEDING SUPPLEMENT, NEPRO CARB STEADY,) LIQD Take 237 mLs by mouth 2 (two) times daily between meals. 03/16/13   Standley Brooking, MD  ondansetron (ZOFRAN) 4 MG tablet Take 4 mg by mouth daily as needed for nausea.     Historical Provider, MD     Allergies:  Allergies  Allergen Reactions  . Tylenol [Acetaminophen] Other (See Comments)    cirrhosis    Social History:   reports that he quit smoking about 19 months ago. His smoking  use included Cigarettes. He has a 30 pack-year smoking history. He quit smokeless tobacco use about 10 months ago. He reports that he does not drink alcohol or use illicit drugs.  Family History: No family history on file.   Physical Exam: Filed Vitals:   03/18/13 1922 03/18/13 2136 03/18/13 2222  BP: 122/51 110/45 127/64  Pulse:  115 117  Temp: 98.6 F (37 C)  98.3 F (36.8 C)  TempSrc: Oral  Oral  Resp: 21 23 22   Height: 6\' 3"  (1.905 m)  6\' 3"  (1.905 m)  Weight: 101.606 kg (224 lb)  103.2 kg (227 lb 8.2 oz)  SpO2: 99% 97% 100%   Blood pressure 127/64, pulse 117, temperature 98.3 F (36.8 C), temperature source Oral, resp. rate 22, height 6\' 3"  (1.905 m), weight 103.2 kg (227 lb 8.2 oz), SpO2 100.00%. Body mass index is 28.44 kg/(m^2).   GEN:  Pleasant middle-aged Caucasian gentleman lying bed in no acute distress; looks older than  his stated age she tries to be cooperative with exam PSYCH:  alert and oriented x4; neither anxious nor depressed; affect is appropriate. HEENT: Mucous membranes pale, dry and anicteric; PERRLA; EOM intact; no cervical lymphadenopathy nor thyromegaly or carotid bruit; no JVD; Breasts:: Not examined CHEST WALL: No tenderness CHEST: Normal respiration, clear to auscultation anteriorly HEART: Tachycardic; 2/6 systolic murmur BACK: No kyphosis no scoliosis; no CVA tenderness ABDOMEN: Obese, soft non-tender; no masses,  normal abdominal bowel sounds; moderate pannus; no intertriginous candida. Rectal Exam: Not done EXTREMITIES:  age-appropriate arthropathy of the hands and knees; 2+ edema; no ulcerations. Genitalia: not examined PULSES: 2+ and symmetric SKIN: Stage II decubitus of his sacrum and buttocks CNS: Cranial nerves 2-12 grossly intact no focal lateralizing neurologic deficit   Labs on Admission:  Basic Metabolic Panel:  Recent Labs Lab 03/12/13 0515 03/13/13 0622 03/14/13 0550 03/16/13 0448 03/18/13 2000  NA 133* 137 137 136 132*  K 3.8 4.2 4.3 4.1 4.3  CL 99 103 101 100 97  CO2 30 32 31 28 27   GLUCOSE 175* 100* 122* 143* 137*  BUN 31* 25* 36* 34* 45*  CREATININE 4.72* 3.56* 4.47* 4.44* 4.94*  CALCIUM 8.4 8.3* 8.6 8.9 9.2  PHOS 2.3  --   --   --   --    Liver Function Tests:  Recent Labs Lab 03/18/13 2000  AST 38*  ALT 13  ALKPHOS 120*  BILITOT 1.4*  PROT 5.8*  ALBUMIN 1.8*    Recent Labs Lab 03/18/13 2000  LIPASE 32   No results found for this basename: AMMONIA,  in the last 168 hours CBC:  Recent Labs Lab 03/13/13 0622 03/14/13 0550 03/15/13 0544 03/16/13 1107 03/18/13 2000  WBC 6.2 6.0 6.1 7.1 7.9  NEUTROABS  --   --   --   --  5.8  HGB 8.2* 7.8* 7.2* 9.2* 7.7*  HCT 24.5* 23.1* 21.7* 27.4* 22.9*  MCV 89.7 90.6 91.2 90.1 91.2  PLT 86* 104* 102* 129* 153   Cardiac Enzymes:  Recent Labs Lab 03/18/13 2000  TROPONINI <0.30   BNP: No  components found with this basename: POCBNP,  D-dimer: No components found with this basename: D-DIMER,  CBG:  Recent Labs Lab 03/15/13 1115 03/15/13 1659 03/15/13 2212 03/16/13 0740 03/16/13 1629  GLUCAP 154* 159* 139* 137* 116*    Radiological Exams on Admission: Dg Chest Port 1 View  03/18/2013   CLINICAL DATA:  Abdominal pain. Shortness of breath.  EXAM: PORTABLE CHEST - 1 VIEW  COMPARISON:  03/09/2013  FINDINGS: Emphysema noted with stable right pleural thickening. Indistinct opacity medially at the right lung base. Heart size within normal limits for technique. Atherosclerotic aortic arch noted. Mild interstitial accentuation noted bilaterally, likely chronic.  IMPRESSION: 1. Suspected small right pleural effusion. 2. Emphysema. 3. Ill-defined density at the right medial lung base. This has been previously characterized as rounded atelectasis on prior CT scans.   Electronically Signed   By: Herbie Baltimore   On: 03/18/2013 20:13      Assessment/Plan   Active Problems:   Alcoholic cirrhosis/remote TIPS   Diabetes mellitus type 2, uncontrolled, with complications   Acute blood loss anemia   Abdominal pain   GAVE (gastric antral vascular ectasia)   Upper GI bleeding   ESRD on dialysis   Decubitus ulcer of sacral region, stage 2   PLAN: Prepare 4 units of packed red cells and transfuse 2 now; H&H every 8 hours Consult GI service for assistance with management; the there was a discussion with Dr. Clarene Duke and it seems he will need transfer to a tertiary care facility. Wound nurse consult for decubitus Nephrology consult for dialysis tomorrow Discuss hospice care -vigorously declines  Other plans as per orders.  Code Status: Full code Family Communication: No family at bedside Disposition Plan:  Likely transfer to a tertiary care   [place] [facility] Nocturnist [facility] Pager [phone]   03/18/2013, 11:50 PM

## 2013-03-18 NOTE — ED Notes (Signed)
Pt is resident of [facility], reportedly with onset of GI Bleed tonight.  Pt transported via ems and refused ems to start an IV on him, states "I always get a PICC line"

## 2013-03-18 NOTE — ED Provider Notes (Signed)
CSN: [id number]     Arrival date & time 03/18/13  1919 History   First MD Initiated Contact with Patient 03/18/13 1930     Chief Complaint  Patient presents with  . GI Bleeding    HPI Pt was seen at 1930.  Per [facility] NH and pt, c/o gradual onset and persistence of constant black stools for the past several months. Pt states he has been admitted x3 in the past 1 month for same and also "taking iron tablets" (was discharged from [facility] 2 days ago). Pt states tonight he "vomited blood" once PTA. Denies abd pain, no bloody stools, no back pain, no CP/SOB, no fevers.    Past Medical History  Diagnosis Date  . Rectal bleeding   . Dialysis care   . GI bleed   . Hypertension   . Esophageal varices   . Alcoholic liver disease   . Hepatic encephalopathy   . Ascites   . Diabetes mellitus   . Detached retina   . Pneumonia   . 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 ESRD on hemodialysis    Past Surgical History  Procedure Laterality Date  . Esophagogastroduodenoscopy  12/31/2010    EGD [facility] THERAPY  . Esophagogastroduodenoscopy  05/31/2012E    GD [facility] ABLATION  . Small bowel givens  12/01/2010  . Colonoscopy  09/07/2010  . Esophagogastroduodenoscopy  09/05/2010    OUTLAW  . Lung surgery  6/11    [place]  . Esophagogastroduodenoscopy  03/26/2011    Procedure: ESOPHAGOGASTRODUODENOSCOPY (EGD);  Surgeon: Malissa Hippo, MD;  [facility];  Service: Endoscopy;  Laterality: N/A;  8:30 am  . Hot hemostasis  03/26/2011    Procedure: HOT HEMOSTASIS (ARGON PLASMA COAGULATION/BICAP);  Surgeon: Malissa Hippo, MD;  Location: [facility];  Service: Endoscopy;  Laterality: N/A;  . Stent in liver    . Esophagogastroduodenoscopy N/A 10/24/2012    Procedure: ESOPHAGOGASTRODUODENOSCOPY (EGD);  Surgeon: Malissa Hippo, MD;  Location[facility];  Service: Endoscopy;  Laterality: N/A;  . Esophagogastroduodenoscopy N/A 11/22/2012    Procedure:  ESOPHAGOGASTRODUODENOSCOPY (EGD);  Surgeon: Charna Elizabeth, MD;  Location: [facility] ENDOSCOPY;  Service: Endoscopy;  Laterality: N/A;  . Paracentesis    . Esophagogastroduodenoscopy N/A 12/21/2012    Procedure: ESOPHAGOGASTRODUODENOSCOPY (EGD);  Surgeon: Malissa Hippo, MD;  [facility];  Service: Endoscopy;  Laterality: N/A;  250-moved to 155 Ann to notify pt  . Hot hemostasis N/A 12/21/2012    Procedure: HOT HEMOSTASIS (ARGON PLASMA COAGULATION/BICAP);  Surgeon: Malissa Hippo, MD;  Location: [facility];  Service: Endoscopy;  Laterality: N/A;  . Esophagogastroduodenoscopy N/A 01/25/2013    Procedure: ESOPHAGOGASTRODUODENOSCOPY (EGD);  Surgeon: Malissa Hippo, MD;  [facility];  Service: Endoscopy;  Laterality: N/A;  100  . Hot hemostasis N/A 01/25/2013    Procedure: HOT HEMOSTASIS (ARGON PLASMA COAGULATION/BICAP);  Surgeon: Malissa Hippo, MD;  [facility];  Service: Endoscopy;  Laterality: N/A;  . Esophagogastroduodenoscopy N/A 02/16/2013    Procedure: ESOPHAGOGASTRODUODENOSCOPY (EGD);  Surgeon: Malissa Hippo, MD;  [facility];  Service: Endoscopy;  Laterality: N/A;  . Esophagogastroduodenoscopy N/A 03/07/2013    Procedure: ESOPHAGOGASTRODUODENOSCOPY (EGD);  Surgeon: Malissa Hippo, MD;  [facility];  Service: Endoscopy;  Laterality: N/A;  . Esophagogastroduodenoscopy N/A 03/09/2013    Procedure: ESOPHAGOGASTRODUODENOSCOPY (EGD);  Surgeon: Malissa Hippo, MD;  [facility];  Service: Endoscopy;  Laterality: N/A;    History  Substance Use Topics  . Smoking status: Former Smoker -- 1.00 packs/day for 30 years    Types: Cigarettes    Quit date: 08/11/2011  . Smokeless tobacco: Former Neurosurgeon    Quit date: 05/12/2012  . Alcohol Use: No     Comment: denies-quit 2000    Review of Systems ROS: Statement: All systems negative except as marked or noted in the HPI; Constitutional: Negative for fever and chills. ; ; Eyes: Negative  for eye pain, redness and discharge. ; ; ENMT: Negative for ear pain, hoarseness, nasal congestion, sinus pressure and sore throat. ; ; Cardiovascular: Negative for chest pain, palpitations, diaphoresis, dyspnea and peripheral edema. ; ; Respiratory: Negative for cough, wheezing and stridor. ; ; Gastrointestinal: +N/V, hematemesis, black stools. Negative for diarrhea, abdominal pain, blood in stool, jaundice and rectal bleeding. . ; ; Genitourinary: Negative for dysuria, flank pain and hematuria. ; ; Musculoskeletal: Negative for back pain and neck pain. Negative for swelling and trauma.; ; Skin: Negative for pruritus, rash, abrasions, blisters, bruising and skin lesion.; ; Neuro: Negative for headache, lightheadedness and neck stiffness. Negative for weakness, altered level of consciousness , altered mental status, extremity weakness, paresthesias, involuntary movement, seizure and syncope.       Allergies  Tylenol  Home Medications   Current Outpatient Rx  Name  Route  Sig  Dispense  Refill  . ciprofloxacin (CIPRO) 500 MG tablet   Oral   Take 1 tablet (500 mg total) by mouth daily.   30 tablet   3     [place] needs to be taking this medication daily   . ferrous sulfate 325 (65 FE) MG tablet   Oral   Take 1 tablet (325 mg total) by mouth 2 (two) times daily with a meal.         . Folic Acid 0.8 MG CAPS   Oral   Take 1 capsule by mouth at bedtime.         . furosemide (LASIX) 80 MG tablet   Oral   Take 80 mg by mouth 2 (two) times daily.         [name] lactulose (CHRONULAC) 10 GM/15ML solution   Oral   Take 20 g by mouth daily.          . metoCLOPramide (REGLAN) 5 MG tablet   Oral   Take 1 tablet (5 mg total) by mouth 4 (four) times daily -  before meals and at bedtime.         . pantoprazole (PROTONIX) 40 MG tablet   Oral   Take 1 tablet (40 mg total) by mouth 2 (two) times daily before a meal.   120 tablet   2   . sevelamer carbonate (RENVELA) 800 MG tablet   Oral    Take 2 tablets (1,600 mg total) by mouth 3 (three) times daily with meals.   180 tablet   2   . sucralfate (CARAFATE) 1 GM/10ML suspension   Oral   Take 10 mLs (1 g total) by mouth 4 (four) times daily.   420 mL   0   . traMADol (ULTRAM) 50 MG tablet   Oral   Take 50 mg by mouth 3 (three) times daily as needed for pain.          . hydrocortisone valerate ointment (WESTCORT) 0.2 %   Topical   Apply 1 application topically every Monday, Wednesday, and Friday. For numbing prior to dialysis         .  Nutritional Supplements (FEEDING SUPPLEMENT, NEPRO CARB STEADY,) LIQD   Oral   Take 237 mLs by mouth 2 (two) times daily between meals.         . ondansetron (ZOFRAN) 4 MG tablet   Oral   Take 4 mg by mouth daily as needed for nausea.           BP 122/51  Temp(Src) 98.6 F (37 C) (Oral)  Resp 21  Ht 6\' 3"  (1.905 m)  Wt 224 lb (101.606 kg)  BMI 28 kg/m2  SpO2 99% Physical Exam 1935: Physical examination:  Nursing notes reviewed; Vital signs and O2 SAT reviewed;  Constitutional: Well developed, Well nourished, Well hydrated, In no acute distress; Head:  Normocephalic, atraumatic; Eyes: EOMI, PERRL, No scleral icterus; ENMT: Mouth and pharynx normal, Mucous membranes moist; Neck: Supple, Full range of motion, No lymphadenopathy; Cardiovascular: Tachycardic rate and rhythm, No gallop; Respiratory: Breath sounds clear & equal bilaterally, No wheezes.  Speaking full sentences with ease, Normal respiratory effort/excursion; Chest: Nontender, Movement normal; Abdomen: Soft, Nontender, Nondistended, Normal bowel sounds. Rectal exam performed w/permission of pt and ED RN chaperone present.  Anal tone normal.  Non-tender, soft black stool in rectal vault, heme positive.  No fissures, no external hemorrhoids, no palp masses.;; Genitourinary: No CVA tenderness; Extremities: Pulses normal, No tenderness, +3 pedal edema feet to groin. No calf asymmetry.; Neuro: AA&Ox3, Major CN grossly intact.   Speech clear. No gross focal motor or sensory deficits in extremities.; Skin: Color pale, Warm, Dry.   ED Course  Procedures   2005:  T/C to GI Dr. Karilyn Cota, case discussed, including:  HPI, pertinent PM/SHx, VS/PE, dx testing, ED course and treatment: states he knows pt well, performed EGD x2 this past week for GAVE (source as his GI bleeding), pt has very small esophageal varices and they are not the source of his GI bleeding. Pt needs tertiary care, but pt apparently was refused liver transplant at [facility] (Dr. Pollyann Samples) because he is not ambulatory; states pt can stay at [facility] or transfer to [facility] as pt has been evaluated previously at both hospitals, [facility] has a longer endoscope to be able to reach small bowel where pt may possibly have further GAVE that need intervention; if pt stays at [facility] he can consult tomorrow.  2110:  No emesis or stooling while in the ED. IV protonix bolus and gtt begun. H/H lower than d/c H/H 2 days ago but within pt's baseline. Denies CP/SOB/abd pain. Dx and testing d/w pt.  Questions answered.  Verb understanding, agreeable to admit.  T/C to [facility] Dr. Orvan Falconer, case discussed, including:  HPI, pertinent PM/SHx, VS/PE, dx testing, ED course and treatment:  Agreeable to admit to [facility], requests to write temporary orders, obtain tele bed.    MDM  MDM Reviewed: previous chart, nursing note and vitals Reviewed previous: labs and ECG Interpretation: labs, ECG and x-ray    Date: 03/18/2013  Rate: 117  Rhythm: sinus tachycardia, baseline wander  QRS Axis: normal  Intervals: normal  ST/T Wave abnormalities: normal  Conduction Disutrbances:none  Narrative Interpretation:   Old EKG Reviewed: unchanged; no significant changes from previous EKG dated 02/25/2013.   Results for orders placed during the hospital encounter of 03/18/13  CBC WITH DIFFERENTIAL      Result Value Range   WBC 7.9  4.0 - 10.5 K/uL   RBC 2.51 (*) 4.22 - 5.81 MIL/uL   Hemoglobin 7.7 (*) 13.0 - 17.0 g/dL    HCT 16.1 (*) 09.6 -  52.0 %   MCV 91.2  78.0 - 100.0 fL   MCH 30.7  26.0 - 34.0 pg   MCHC 33.6  30.0 - 36.0 g/dL   RDW 16.1 (*) 09.6 - 04.5 %   Platelets 153  150 - 400 K/uL   Neutrophils Relative % 74  43 - 77 %   Neutro Abs 5.8  1.7 - 7.7 K/uL   Lymphocytes Relative 14  12 - 46 %   Lymphs Abs 1.1  0.7 - 4.0 K/uL   Monocytes Relative 9  3 - 12 %   Monocytes Absolute 0.7  0.1 - 1.0 K/uL   Eosinophils Relative 3  0 - 5 %   Eosinophils Absolute 0.3  0.0 - 0.7 K/uL   Basophils Relative 1  0 - 1 %   Basophils Absolute 0.1  0.0 - 0.1 K/uL  COMPREHENSIVE METABOLIC PANEL      Result Value Range   Sodium 132 (*) 135 - 145 mEq/L   Potassium 4.3  3.5 - 5.1 mEq/L   Chloride 97  96 - 112 mEq/L   CO2 27  19 - 32 mEq/L   Glucose, Bld 137 (*) 70 - 99 mg/dL   BUN 45 (*) 6 - 23 mg/dL   Creatinine, Ser 4.09 (*) 0.50 - 1.35 mg/dL   Calcium 9.2  8.4 - 81.1 mg/dL   Total Protein 5.8 (*) 6.0 - 8.3 g/dL   Albumin 1.8 (*) 3.5 - 5.2 g/dL   AST 38 (*) 0 - 37 U/L   ALT 13  0 - 53 U/L   Alkaline Phosphatase 120 (*) 39 - 117 U/L   Total Bilirubin 1.4 (*) 0.3 - 1.2 mg/dL   GFR calc non Af Amer 11 (*) >90 mL/min   GFR calc Af Amer 13 (*) >90 mL/min  LIPASE, BLOOD      Result Value Range   Lipase 32  11 - 59 U/L  PROTIME-INR      Result Value Range   Prothrombin Time 15.9 (*) 11.6 - 15.2 seconds   INR 1.30  0.00 - 1.49  APTT      Result Value Range   aPTT 37  24 - 37 seconds  TROPONIN I      Result Value Range   Troponin I <0.30  <0.30 ng/mL  TYPE AND SCREEN      Result Value Range   ABO/RH(D) B NEG     Antibody Screen NEG     Sample Expiration 03/21/2013     Dg Chest Port 1 View 03/18/2013   CLINICAL DATA:  Abdominal pain. Shortness of breath.  EXAM: PORTABLE CHEST - 1 VIEW  COMPARISON:  03/09/2013  FINDINGS: Emphysema noted with stable right pleural thickening. Indistinct opacity medially at the right lung base. Heart size within normal limits for technique. Atherosclerotic aortic arch noted.  Mild interstitial accentuation noted bilaterally, likely chronic.  IMPRESSION: 1. Suspected small right pleural effusion. 2. Emphysema. 3. Ill-defined density at the right medial lung base. This has been previously characterized as rounded atelectasis on prior CT scans.   Electronically Signed   By: Herbie Baltimore   On: 03/18/2013 20:13    Results for JAHLANI, LORENTZ (MRN [id number]) as of 03/18/2013 21:14  Ref. Range 03/13/2013 06:22 03/14/2013 05:50 03/15/2013 05:44 03/16/2013 11:07 03/18/2013 20:00  Hemoglobin Latest Range: 13.0-17.0 g/dL 8.2 (L) 7.8 (L) 7.2 (L) 9.2 (L) 7.7 (L)  HCT Latest Range: 39.0-52.0 % 24.5 (L) 23.1 (L) 21.7 (L) 27.4 (L) 22.9 (  LLaray Anger, DO 03/20/13 2147

## 2013-03-19 ENCOUNTER — Other Ambulatory Visit: Payer: Self-pay

## 2013-03-19 ENCOUNTER — Encounter (HOSPITAL_COMMUNITY): Payer: Self-pay | Admitting: *Deleted

## 2013-03-19 DIAGNOSIS — N186 End stage renal disease: Secondary | ICD-10-CM

## 2013-03-19 DIAGNOSIS — K921 Melena: Secondary | ICD-10-CM

## 2013-03-19 DIAGNOSIS — K922 Gastrointestinal hemorrhage, unspecified: Secondary | ICD-10-CM

## 2013-03-19 LAB — URINE MICROSCOPIC-ADD ON

## 2013-03-19 LAB — HEMOGLOBIN AND HEMATOCRIT, BLOOD
Hemoglobin: 6.7 g/dL — CL (ref 13.0–17.0)
Hemoglobin: 8.4 g/dL — ABNORMAL LOW (ref 13.0–17.0)

## 2013-03-19 LAB — URINALYSIS, ROUTINE W REFLEX MICROSCOPIC
Glucose, UA: NEGATIVE mg/dL
Ketones, ur: NEGATIVE mg/dL
Protein, ur: 30 mg/dL — AB
pH: 8 (ref 5.0–8.0)

## 2013-03-19 LAB — MRSA PCR SCREENING: MRSA by PCR: POSITIVE — AB

## 2013-03-19 LAB — GLUCOSE, CAPILLARY

## 2013-03-19 LAB — PREPARE RBC (CROSSMATCH)

## 2013-03-19 MED ORDER — MUPIROCIN 2 % EX OINT
1.0000 "application " | TOPICAL_OINTMENT | Freq: Two times a day (BID) | CUTANEOUS | Status: DC
  Administered 2013-03-19: 1 via NASAL
  Filled 2013-03-19: qty 22

## 2013-03-19 MED ORDER — INSULIN ASPART 100 UNIT/ML ~~LOC~~ SOLN: 0.0000 [IU] | SUBCUTANEOUS | Status: DC

## 2013-03-19 MED ORDER — ONDANSETRON HCL 4 MG/2ML IJ SOLN: 4.0000 mg | INTRAMUSCULAR | Status: DC | PRN

## 2013-03-19 MED ORDER — SODIUM CHLORIDE 0.9 % IJ SOLN
3.0000 mL | Freq: Two times a day (BID) | INTRAMUSCULAR | Status: DC
Start: 2013-03-19 — End: 2013-03-19
  Administered 2013-03-19 (×2): 3 mL via INTRAVENOUS

## 2013-03-19 MED ORDER — TRAMADOL HCL 50 MG PO TABS: 50.0000 mg | ORAL_TABLET | Freq: Three times a day (TID) | ORAL | Status: DC | PRN

## 2013-03-19 MED ORDER — CHLORHEXIDINE GLUCONATE CLOTH 2 % EX PADS
6.0000 | MEDICATED_PAD | Freq: Every day | CUTANEOUS | Status: DC
  Administered 2013-03-19: 6 via TOPICAL

## 2013-03-19 MED ORDER — POTASSIUM CHLORIDE IN NACL 20-0.9 MEQ/L-% IV SOLN
INTRAVENOUS | Status: DC
  Administered 2013-03-19: 02:00:00 via INTRAVENOUS

## 2013-03-19 MED ORDER — MORPHINE SULFATE 2 MG/ML IJ SOLN
2.0000 mg | INTRAMUSCULAR | Status: DC | PRN
  Administered 2013-03-19 (×3): 2 mg via INTRAVENOUS
  Filled 2013-03-19 (×3): qty 1

## 2013-03-19 MED ORDER — LIDOCAINE-PRILOCAINE 2.5-2.5 % EX CREA
TOPICAL_CREAM | CUTANEOUS | Status: DC | PRN
  Filled 2013-03-19: qty 5

## 2013-03-19 NOTE — Consult Note (Signed)
WOC consult Note Reason for Consult:Stage II ulcer on buttocks/sacrum.  NOTE:  Patient consult conducted telephonically with WTA (Wound Treatment Associate) Mat Carne, RN. 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  Patient is known to the staff caring for him this admission.  Recently healed areas are exhibiting partial thickness skin loss and erythema.  Pressure Ulcer POA: No Measurement:Scattered pinpoint openings in mildly erythematous tissue at perianal and buttock areas Wound VHQ:IONGE, pink, moist Drainage (amount, consistency, odor) None Periwound:Mild erythema Dressing procedure/placement/frequency:Treatment initiated yesterday to continue: zinc oxide based barrier cream with aromatherapy essential oils (prepared by an aroma therapist) i.e., lavender and tea tree oils. Preparation left at bedside and to be applied to affected area several times each shift. I will not follow, but will remain available to this patient, the nursing and medical teams.  Please re-consult if needed. Thanks, Ladona Mow, MSN, RN, GNP, [place], CWON-[facility] [phone])

## 2013-03-19 NOTE — Plan of Care (Signed)
Spoke with Cassie Freer (Son) who stated he was medical POA. He was frustrated and wanted dad to be transferred to [facility] instead of [facility].  I confirmed with Dr. Karilyn Cota to confirm placement and he said pt was going to [facility] for GI bleed, not a transplant.  [facility] has already denied pt for admission for transplant (unless pt is ambulatory).  [facility] will not take pt at this point but he suggested that if family wants to call [facility], they may try.  I attempted to call son to inform of findings and was unsuccessful - there was no answer (X3 tries.)

## 2013-03-19 NOTE — Clinical Social Work Note (Signed)
Pt well known to CSW. Admitted from [facility] and will be transferred to tertiary care center today. CSW notified [place] of above.  Derenda Fennel, [place] [phone]

## 2013-03-19 NOTE — Consult Note (Signed)
Reason for Consult:Vomited bright red blood. Referring Physician: Hospitalist services.  Timothy Chan is an 62 y.o. male.  HPI: Admitted thru the ED yesterday with c/o vomiting blood. Vomitus was brown in color. Vomited after drinking about 4 oz of Dr. Reino Kent.  He vomited x 2. He became very weak and was then transferred to [facility].  Presently a resident of [place].  No further vomiting episodes. He continues to have black stools.  He has received 2 units of blood since admission.  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 He underwent three EGD's last month.  H and H this am 6.7 and 19.8.  He also has a hx of ESRD and receives dialysis on M-W-F He says he feels okay. He is sleepy.  He denies any pain. He is not ambulating now.  CBC    Component Value Date/Time   WBC 7.9 03/18/2013 2000   RBC 2.51* 03/18/2013 2000   HGB 6.7* 03/19/2013 0014   HCT 19.8* 03/19/2013 0014   PLT 153 03/18/2013 2000   MCV 91.2 03/18/2013 2000   MCH 30.7 03/18/2013 2000   MCHC 33.6 03/18/2013 2000   RDW 16.9* 03/18/2013 2000   LYMPHSABS 1.1 03/18/2013 2000   MONOABS 0.7 03/18/2013 2000   EOSABS 0.3 03/18/2013 2000   BASOSABS 0.1 03/18/2013 2000       Past Medical History  Diagnosis Date  . Rectal bleeding   . Dialysis care   . GI bleed   . Hypertension   . Esophageal varices   . Alcoholic liver disease   . Hepatic encephalopathy   . Ascites   . Diabetes mellitus   . Detached retina   . Pneumonia   . 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 ESRD on hemodialysis     Past Surgical History  Procedure Laterality Date  . Esophagogastroduodenoscopy  12/31/2010    EGD APC THERAPY  . Esophagogastroduodenoscopy  05/31/2012E    GD APC ABLATION  . Small bowel givens  12/01/2010  . Colonoscopy  09/07/2010  . Esophagogastroduodenoscopy  09/05/2010    OUTLAW  . Lung surgery  6/11    [place]  . Esophagogastroduodenoscopy  03/26/2011     Procedure: ESOPHAGOGASTRODUODENOSCOPY (EGD);  Surgeon: Malissa Hippo, MD;  [facility];  Service: Endoscopy;  Laterality: N/A;  8:30 am  . Hot hemostasis  03/26/2011    Procedure: HOT HEMOSTASIS (ARGON PLASMA COAGULATION/BICAP);  Surgeon: Malissa Hippo, MD;  [facility];  Service: Endoscopy;  Laterality: N/A;  . Stent in liver    . Esophagogastroduodenoscopy N/A 10/24/2012    Procedure: ESOPHAGOGASTRODUODENOSCOPY (EGD);  Surgeon: Malissa Hippo, MD;  [facility];  Service: Endoscopy;  Laterality: N/A;  . Esophagogastroduodenoscopy N/A 11/22/2012    Procedure: ESOPHAGOGASTRODUODENOSCOPY (EGD);  Surgeon: Charna Elizabeth, MD;  Location: [facility] ENDOSCOPY;  Service: Endoscopy;  Laterality: N/A;  . Paracentesis    . Esophagogastroduodenoscopy N/A 12/21/2012    Procedure: ESOPHAGOGASTRODUODENOSCOPY (EGD);  Surgeon: Malissa Hippo, MD;  [facility];  Service: Endoscopy;  Laterality: N/A;  250-moved to 155 Ann to notify pt  . Hot hemostasis N/A 12/21/2012    Procedure: HOT HEMOSTASIS (ARGON PLASMA COAGULATION/BICAP);  Surgeon: Malissa Hippo, MD;  [facility];  Service: Endoscopy;  Laterality: N/A;  . Esophagogastroduodenoscopy N/A 01/25/2013    Procedure: ESOPHAGOGASTRODUODENOSCOPY (EGD);  Surgeon: Malissa Hippo, MD;  [facility];  Service: Endoscopy;  Laterality: N/A;  100  . Hot hemostasis N/A 01/25/2013    Procedure: HOT HEMOSTASIS (ARGON PLASMA COAGULATION/BICAP);  Surgeon: Malissa Hippo, MD;  [facility];  Service: Endoscopy;  Laterality: N/A;  . Esophagogastroduodenoscopy N/A 02/16/2013    Procedure: ESOPHAGOGASTRODUODENOSCOPY (EGD);  Surgeon: Malissa Hippo, MD;  [facility];  Service: Endoscopy;  Laterality: N/A;  . Esophagogastroduodenoscopy N/A 03/07/2013    Procedure: ESOPHAGOGASTRODUODENOSCOPY (EGD);  Surgeon: Malissa Hippo, MD;  [facility];  Service: Endoscopy;  Laterality: N/A;  .  Esophagogastroduodenoscopy N/A 03/09/2013    Procedure: ESOPHAGOGASTRODUODENOSCOPY (EGD);  Surgeon: Malissa Hippo, MD;  [facility];  Service: Endoscopy;  Laterality: N/A;    History reviewed. No pertinent family history.  Social History:  reports that he quit smoking about 19 months ago. His smoking use included Cigarettes. He has a 30 pack-year smoking history. He quit smokeless tobacco use about 10 months ago. He reports that he does not drink alcohol or use illicit drugs.  Allergies:  Allergies  Allergen Reactions  . Tylenol [Acetaminophen] Other (See Comments)    cirrhosis    Medications: I have reviewed the patient's current medications.  Results for orders placed during the hospital encounter of 03/18/13 (from the past 48 hour(s))  CBC WITH DIFFERENTIAL     Status: Abnormal   Collection Time    03/18/13  8:00 PM      Result Value Range   WBC 7.9  4.0 - 10.5 K/uL   RBC 2.51 (*) 4.22 - 5.81 MIL/uL   Hemoglobin 7.7 (*) 13.0 - 17.0 g/dL   HCT 29.5 (*) 62.1 - 30.8 %   MCV 91.2  78.0 - 100.0 fL   MCH 30.7  26.0 - 34.0 pg   MCHC 33.6  30.0 - 36.0 g/dL   RDW 65.7 (*) 84.6 - 96.2 %   Platelets 153  150 - 400 K/uL   Neutrophils Relative % 74  43 - 77 %   Neutro Abs 5.8  1.7 - 7.7 K/uL   Lymphocytes Relative 14  12 - 46 %   Lymphs Abs 1.1  0.7 - 4.0 K/uL   Monocytes Relative 9  3 - 12 %   Monocytes Absolute 0.7  0.1 - 1.0 K/uL   Eosinophils Relative 3  0 - 5 %   Eosinophils Absolute 0.3  0.0 - 0.7 K/uL   Basophils Relative 1  0 - 1 %   Basophils Absolute 0.1  0.0 - 0.1 K/uL  COMPREHENSIVE METABOLIC PANEL     Status: Abnormal   Collection Time    03/18/13  8:00 PM      Result Value Range   Sodium 132 (*) 135 - 145 mEq/L   Potassium 4.3  3.5 - 5.1 mEq/L   Chloride 97  96 - 112 mEq/L   CO2 27  19 - 32 mEq/L   Glucose, Bld 137 (*) 70 - 99 mg/dL   BUN 45 (*) 6 - 23 mg/dL   Creatinine, Ser 9.52 (*) 0.50 - 1.35 mg/dL   Calcium 9.2  8.4 - 84.1 mg/dL   Total  Protein 5.8 (*) 6.0 - 8.3 g/dL   Albumin 1.8 (*) 3.5 - 5.2 g/dL   AST 38 (*) 0 - 37 U/L   ALT 13  0 - 53 U/L   Alkaline Phosphatase 120 (*) 39 - 117 U/L   Total Bilirubin 1.4 (*) 0.3 - 1.2  mg/dL   GFR calc non Af Amer 11 (*) >90 mL/min   GFR calc Af Amer 13 (*) >90 mL/min   Comment: (NOTE)     The eGFR has been calculated using the CKD EPI equation.     This calculation has not been validated in all clinical situations.     eGFR's persistently <90 mL/min signify possible Chronic Kidney     Disease.  LIPASE, BLOOD     Status: None   Collection Time    03/18/13  8:00 PM      Result Value Range   Lipase 32  11 - 59 U/L  PROTIME-INR     Status: Abnormal   Collection Time    03/18/13  8:00 PM      Result Value Range   Prothrombin Time 15.9 (*) 11.6 - 15.2 seconds   INR 1.30  0.00 - 1.49  APTT     Status: None   Collection Time    03/18/13  8:00 PM      Result Value Range   aPTT 37  24 - 37 seconds   Comment:            IF BASELINE aPTT IS ELEVATED,     SUGGEST PATIENT RISK ASSESSMENT     BE USED TO DETERMINE APPROPRIATE     ANTICOAGULANT THERAPY.  TROPONIN I     Status: None   Collection Time    03/18/13  8:00 PM      Result Value Range   Troponin I <0.30  <0.30 ng/mL   Comment:            Due to the release kinetics of cTnI,     a negative result within the first hours     of the onset of symptoms does not rule out     myocardial infarction with certainty.     If myocardial infarction is still suspected,     repeat the test at appropriate intervals.  TYPE AND SCREEN     Status: None   Collection Time    03/18/13  8:01 PM      Result Value Range   ABO/RH(D) B NEG     Antibody Screen NEG     Sample Expiration 03/21/2013     Unit Number [id number]     Blood Component Type RED CELLS,LR     Unit division 00     Status of Unit ISSUED     Transfusion Status OK TO TRANSFUSE     Crossmatch Result Compatible     Unit Number [id number]     Blood Component Type RED  CELLS,LR     Unit division 00     Status of Unit ISSUED     Transfusion Status OK TO TRANSFUSE     Crossmatch Result Compatible    PREPARE RBC (CROSSMATCH)     Status: None   Collection Time    03/19/13 12:14 AM      Result Value Range   Order Confirmation ORDER PROCESSED BY BLOOD BANK    HEMOGLOBIN AND HEMATOCRIT, BLOOD     Status: Abnormal   Collection Time    03/19/13 12:14 AM      Result Value Range   Hemoglobin 6.7 (*) 13.0 - 17.0 g/dL   Comment: CRITICAL RESULT CALLED TO, READ BACK BY AND VERIFIED WITH:      NEILSON,T @ 0057 ON 03/19/13 BY WOODIE,J   HCT 19.8 (*) 39.0 - 52.0 %  GLUCOSE, CAPILLARY  Status: Abnormal   Collection Time    03/19/13  1:04 AM      Result Value Range   Glucose-Capillary 116 (*) 70 - 99 mg/dL  URINALYSIS, ROUTINE W REFLEX MICROSCOPIC     Status: Abnormal   Collection Time    03/19/13  2:00 AM      Result Value Range   Color, Urine YELLOW  YELLOW   APPearance CLEAR  CLEAR   Specific Gravity, Urine 1.010  1.005 - 1.030   pH 8.0  5.0 - 8.0   Glucose, UA NEGATIVE  NEGATIVE mg/dL   Hgb urine dipstick TRACE (*) NEGATIVE   Bilirubin Urine NEGATIVE  NEGATIVE   Ketones, ur NEGATIVE  NEGATIVE mg/dL   Protein, ur 30 (*) NEGATIVE mg/dL   Urobilinogen, UA 0.2  0.0 - 1.0 mg/dL   Nitrite NEGATIVE  NEGATIVE   Leukocytes, UA NEGATIVE  NEGATIVE  URINE MICROSCOPIC-ADD ON     Status: None   Collection Time    03/19/13  2:00 AM      Result Value Range   Squamous Epithelial / LPF RARE  RARE   WBC, UA 0-2  <3 WBC/hpf   RBC / HPF 0-2  <3 RBC/hpf   Bacteria, UA RARE  RARE  MRSA PCR SCREENING     Status: Abnormal   Collection Time    03/19/13  3:00 AM      Result Value Range   MRSA by PCR POSITIVE (*) NEGATIVE   Comment:            The GeneXpert MRSA Assay (FDA     approved for NASAL specimens     only), is one component of a     comprehensive MRSA colonization     surveillance program. It is not     intended to diagnose MRSA     infection nor to guide  or     monitor treatment for     MRSA infections.     RESULT CALLED TO, READ BACK BY AND VERIFIED WITH:     NEILSON,T @0530  ON 03/19/13 BY WOODIE,J  GLUCOSE, CAPILLARY     Status: Abnormal   Collection Time    03/19/13  3:24 AM      Result Value Range   Glucose-Capillary 121 (*) 70 - 99 mg/dL  GLUCOSE, CAPILLARY     Status: Abnormal   Collection Time    03/19/13  7:33 AM      Result Value Range   Glucose-Capillary 116 (*) 70 - 99 mg/dL   Comment 1 Documented in Chart     Comment 2 Notify RN      Dg Chest Port 1 View  03/18/2013   CLINICAL DATA:  Abdominal pain. Shortness of breath.  EXAM: PORTABLE CHEST - 1 VIEW  COMPARISON:  03/09/2013  FINDINGS: Emphysema noted with stable right pleural thickening. Indistinct opacity medially at the right lung base. Heart size within normal limits for technique. Atherosclerotic aortic arch noted. Mild interstitial accentuation noted bilaterally, likely chronic.  IMPRESSION: 1. Suspected small right pleural effusion. 2. Emphysema. 3. Ill-defined density at the right medial lung base. This has been previously characterized as rounded atelectasis on prior CT scans.   Electronically Signed   By: Herbie Baltimore   On: 03/18/2013 20:13    ROS Blood pressure 107/60, pulse 117, temperature 98 F (36.7 C), temperature source Oral, resp. rate 20, height 6\' 3"  (1.905 m), weight 227 lb 8.2 oz (103.2 kg), SpO2 99.00%. Physical Exam Alert  and oriented. Skin warm and dry. Oral mucosa is moist.   . Sclera anicteric, conjunctivae is pink. Thyroid not enlarged. No cervical lymphadenopathy. Lungs clear. Heart regular rate and rhythm.  Abdomen is distended but soft. Bruising noted to abdomen.  Bowel sounds are positive. No hepatomegaly. No tenderness.  4+  edema to lower extremities edema to scrotum.   Assessment/Plan: End stage liver disease with hx of GAVE. He continues to have melena. Hx of ESRD and receives dialysis on M-W-F.  Hemoglobin  6.7 before transfusions this  visit. He has received 2 units of PRBCs so far.  He received 9 units of PRBCs last admission.  I will discuss with Dr. Karilyn Cota for possible transfer to [facility] for further evaluation.   SETZER,TERRI W 03/19/2013, 8:27 AM     GI attending note; Patient interviewed. Patient is well known to me from recent hospitalizations. He has advanced cirrhosis in addition to other medical problems. 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 Last month alone he underwent three EGDs with APC ablation. Since June 2014 he has undergone 5 therapeutic EGDs and they provided minimal/transient benefit. He received 9 units of PRBCs during his last condition and he has received 2 units of PRBCs this admission. What he really needs is a para renal transplant but at this point he is not a candidate as discussed previously. He may be bleeding not only from gastric antral vascular ectasia he may have more lesions involving the small intestine. Patient's condition discussed with Dr. Harlen Labs of gastroenterology service at [facility] and he has kindly agreed for the patient to be transferred to their facility. I also went over patient's condition with Dr. Wonda Cheng who is the hospitalist on call. Patient will be transferred to [facility] as soon as bed is available to

## 2013-03-19 NOTE — Progress Notes (Signed)
INITIAL NUTRITION ASSESSMENT  DOCUMENTATION CODES Per approved criteria  -Not Applicable   INTERVENTION: Follow for diet advancement Upon diet advancement, add 30 ml Prostat TID (which will provide 300 kcals and 45 grams protein daily) and Nepro Shake po BID, each supplement provides 425 kcal and 19 grams protein.  NUTRITION DIAGNOSIS: Inadequate oral food intake related to increased nutrition needs due to pressure ulcer as evidenced by NPO, hx of poor po intake.   Goal: Pt will meet >90% of estimated calorie and protein needs  Monitor:  Diet advancement, PO intake, skin integrity, labs, weight changes, changes in status  Reason for Assessment: MST=2  62 y.o. male  Admitting Dx: <principal problem not specified>  ASSESSMENT: Pt recently discharged from [facility] on 03/16/13 after treatment for GI bleed. Pt readmitted for lightheadedness and vomiting blood. He has had 3-4 loose stools for the past 6-8 weeks.  Pt is a resident of [place]. He receives HD MWF. Per nursing home records, he received Nepro BID. He has a hx of prolonged poor PO intake.  Pt with stage II pressure ulcer on sacrum.  GI note reveals that pt with recurrent upper GI blleds secondary to GAVE and has had 3 EGD's in the past month; possible transfer to [facility] for further evaluation.  Weight hx reveals 3# (1.3%) wt loss x 3 months, which is not clinically significant.  Cannot diagnose malnutrition at this time, but pt is at high risk for malnutrition given hx of poor PO intake and increased nutritional needs due to pressure ulcer.  Also noted that hospice has been discussed with pt in the past, but pt continues to decline. MD expects poor prognosis.   Height: Ht Readings from Last 1 Encounters:  03/18/13 6\' 3"  (1.905 m)    Weight: Wt Readings from Last 1 Encounters:  03/18/13 227 lb 8.2 oz (103.2 kg)    Ideal Body Weight: 196#  % Ideal Body Weight: 116%  Wt Readings from Last 10 Encounters:   03/18/13 227 lb 8.2 oz (103.2 kg)  03/16/13 222 lb 0.1 oz (100.7 kg)  03/16/13 222 lb 0.1 oz (100.7 kg)  03/16/13 222 lb 0.1 oz (100.7 kg)  03/16/13 222 lb 0.1 oz (100.7 kg)  02/27/13 213 lb 9.6 oz (96.888 kg)  01/18/13 210 lb (95.255 kg)  01/15/13 220 lb (99.791 kg)  01/05/13 228 lb (103.42 kg)  12/19/12 230 lb (104.327 kg)    Usual Body Weight: 230#  % Usual Body Weight: 99%  BMI:  Body mass index is 28.44 kg/(m^2). Meets criteria for overweight.   Estimated Nutritional Needs: Kcal: 1914-7829 daily Protein: 155-206 grams daily Fluid: 3.1-3.6 L daily  Skin: stage II pressure ulcer on sacrum  Diet Order: NPO  EDUCATION NEEDS: -Education not appropriate at this time   Intake/Output Summary (Last 24 hours) at 03/19/13 1057 Last data filed at 03/19/13 0800  Gross per 24 hour  Intake 1302.5 ml  Output    100 ml  Net 1202.5 ml    Last BM: 03/18/13  Labs:   Recent Labs Lab 03/14/13 0550 03/16/13 0448 03/18/13 2000  NA 137 136 132*  K 4.3 4.1 4.3  CL 101 100 97  CO2 31 28 27   BUN 36* 34* 45*  CREATININE 4.47* 4.44* 4.94*  CALCIUM 8.6 8.9 9.2  GLUCOSE 122* 143* 137*    CBG (last 3)   Recent Labs  03/19/13 0104 03/19/13 0324 03/19/13 0733  GLUCAP 116* 121* 116*    Scheduled Meds: .  Chlorhexidine Gluconate Cloth  6 each Topical Q0600  . insulin aspart  0-9 Units Subcutaneous Q4H  . mupirocin ointment  1 application Nasal BID  . sodium chloride  3 mL Intravenous Q12H    Continuous Infusions: . 0.9 % NaCl with KCl 20 mEq / L Stopped (03/19/13 0138)  . pantoprozole (PROTONIX) infusion Stopped (03/19/13 0138)    Past Medical History  Diagnosis Date  . Rectal bleeding   . Dialysis care   . GI bleed   . Hypertension   . Esophageal varices   . Alcoholic liver disease   . Hepatic encephalopathy   . Ascites   . Diabetes mellitus   . Detached retina   . Pneumonia   . 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 ESRD on  hemodialysis     Past Surgical History  Procedure Laterality Date  . Esophagogastroduodenoscopy  12/31/2010    EGD APC THERAPY  . Esophagogastroduodenoscopy  05/31/2012E    GD APC ABLATION  . Small bowel givens  12/01/2010  . Colonoscopy  09/07/2010  . Esophagogastroduodenoscopy  09/05/2010    OUTLAW  . Lung surgery  6/11    [place]  . Esophagogastroduodenoscopy  03/26/2011    Procedure: ESOPHAGOGASTRODUODENOSCOPY (EGD);  Surgeon: Malissa Hippo, MD;  [facility];  Service: Endoscopy;  Laterality: N/A;  8:30 am  . Hot hemostasis  03/26/2011    Procedure: HOT HEMOSTASIS (ARGON PLASMA COAGULATION/BICAP);  Surgeon: Malissa Hippo, MD;  [facility];  Service: Endoscopy;  Laterality: N/A;  . Stent in liver    . Esophagogastroduodenoscopy N/A 10/24/2012    Procedure: ESOPHAGOGASTRODUODENOSCOPY (EGD);  Surgeon: Malissa Hippo, MD;  [facility];  Service: Endoscopy;  Laterality: N/A;  . Esophagogastroduodenoscopy N/A 11/22/2012    Procedure: ESOPHAGOGASTRODUODENOSCOPY (EGD);  Surgeon: Charna Elizabeth, MD;  Location: [facility] ENDOSCOPY;  Service: Endoscopy;  Laterality: N/A;  . Paracentesis    . Esophagogastroduodenoscopy N/A 12/21/2012    Procedure: ESOPHAGOGASTRODUODENOSCOPY (EGD);  Surgeon: Malissa Hippo, MD;  [facility];  Service: Endoscopy;  Laterality: N/A;  250-moved to 155 Ann to notify pt  . Hot hemostasis N/A 12/21/2012    Procedure: HOT HEMOSTASIS (ARGON PLASMA COAGULATION/BICAP);  Surgeon: Malissa Hippo, MD;  [facility];  Service: Endoscopy;  Laterality: N/A;  . Esophagogastroduodenoscopy N/A 01/25/2013    Procedure: ESOPHAGOGASTRODUODENOSCOPY (EGD);  Surgeon: Malissa Hippo, MD;  [facility];  Service: Endoscopy;  Laterality: N/A;  100  . Hot hemostasis N/A 01/25/2013    Procedure: HOT HEMOSTASIS (ARGON PLASMA COAGULATION/BICAP);  Surgeon: Malissa Hippo, MD;  [facility];  Service: Endoscopy;   Laterality: N/A;  . Esophagogastroduodenoscopy N/A 02/16/2013    Procedure: ESOPHAGOGASTRODUODENOSCOPY (EGD);  Surgeon: Malissa Hippo, MD;  [facility];  Service: Endoscopy;  Laterality: N/A;  . Esophagogastroduodenoscopy N/A 03/07/2013    Procedure: ESOPHAGOGASTRODUODENOSCOPY (EGD);  Surgeon: Malissa Hippo, MD;  [facility];  Service: Endoscopy;  Laterality: N/A;  . Esophagogastroduodenoscopy N/A 03/09/2013    Procedure: ESOPHAGOGASTRODUODENOSCOPY (EGD);  Surgeon: Malissa Hippo, MD;  [facility];  Service: Endoscopy;  Laterality: N/A;    Laroy Mustard A. Mayford Knife, RD, LDN Pager: [phone]

## 2013-03-19 NOTE — Progress Notes (Signed)
CRITICAL VALUE ALERT  Critical value received:  Hgb 6.7  Date of notification:  03/19/13  Time of notification:  0004  Critical value read back: yes  Nurse who received alert:  Foye Deer RN  MD notified (1st page):  Dr. Oralia Manis  Time of first page:  0007  Time MD responded:  0008  Patient already ordered two units of PRBC's. Just confirmed they are ready with lab

## 2013-03-19 NOTE — Progress Notes (Signed)
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 GI consulting and pt will be transferred to tertiary center. No further bloody emesis. Continue NPO and IV PPI.   Acute blood loss anemia: related to upper GI bleed in setting of severe chronic normocytic anemia. S/P 2 units RBC's. Monitoring HG closely   Abdominal pain: improved this am. Continue current pain regimen   Alcoholic cirrhosis/remote TIPS : see #1.  Diabetes mellitus type 2, uncontrolled, with complications: fair control continue SSI.    GAVE (gastric antral vascular ectasia) last hospitalization (discharged 2 days ago) he underwent EGD twice and GAVE again was seen and underwent APC therapy to treat multiple bleeding sites.    ESRD on dialysis: on dialysis. Of note, last hospitalization dialysis challenging at times due to hypotension. Appreciate nephrology assistance.    Decubitus ulcer of sacral region, stage 2 Per wound RN.   Code Status: full Family Communication: none available Disposition Plan: being transferred to [facility] per GI   Consultants:  GI  Nephrology  Procedures:  none  Antibiotics:  none  HPI/Subjective: Lying in bed NAD but obviously ill  Objective: Filed Vitals:   03/19/13 0830  BP: 108/62  Pulse: 116  Temp: 98.2 F (36.8 C)  Resp: 20    Intake/Output Summary (Last 24 hours) at 03/19/13 1021 Last data filed at 03/19/13 0551  Gross per 24 hour  Intake 1302.5 ml  Output    100 ml  Net 1202.5 ml   Filed Weights   03/18/13 1922 03/18/13 2222  Weight: 101.606 kg (224 lb) 103.2 kg (227 lb 8.2 oz)    Exam:   General:  Awake frail appearing  Cardiovascular: RRR No MGR 2-3+ LE edema  Respiratory: normal effort BS clear bilaterally to auscultation  Abdomen: round  soft +BS non-tender to palpation no mass  Musculoskeletal: no clubbing no cyanosis   Data Reviewed: Basic Metabolic Panel:  Recent Labs Lab 03/13/13 0622 03/14/13 0550 03/16/13 0448 03/18/13 2000  NA 137 137 136 132*  K 4.2 4.3 4.1 4.3  CL 103 101 100 97  CO2 32 31 28 27   GLUCOSE 100* 122* 143* 137*  BUN 25* 36* 34* 45*  CREATININE 3.56* 4.47* 4.44* 4.94*  CALCIUM 8.3* 8.6 8.9 9.2   Liver Function Tests:  Recent Labs Lab 03/18/13 2000  AST 38*  ALT 13  ALKPHOS 120*  BILITOT 1.4*  PROT 5.8*  ALBUMIN 1.8*    Recent Labs Lab 03/18/13 2000  LIPASE 32   No results found for this basename: AMMONIA,  in the last 168 hours CBC:  Recent Labs Lab 03/13/13 0622 03/14/13 0550 03/15/13 0544 03/16/13 1107 03/18/13 2000 03/19/13 0014  WBC 6.2 6.0 6.1 7.1 7.9  --   NEUTROABS  --   --   --   --  5.8  --   HGB 8.2* 7.8* 7.2* 9.2* 7.7* 6.7*  HCT 24.5* 23.1* 21.7* 27.4* 22.9* 19.8*  MCV 89.7 90.6 91.2 90.1 91.2  --   PLT 86* 104* 102* 129* 153  --    Cardiac Enzymes:  Recent Labs Lab 03/18/13 2000  TROPONINI <0.30   BNP (last 3 results)  Recent Labs  11/21/12 1145  PROBNP 1167.0*   CBG:  Recent Labs Lab 03/16/13 0740 03/16/13 1629 03/19/13 0104 03/19/13 0324 03/19/13 0733  GLUCAP 137* 116* 116* 121*  116*    Recent Results (from the past 240 hour(s))  CULTURE, BODY FLUID-BOTTLE     Status: None   Collection Time    03/09/13  2:07 PM      Result Value Range Status   Specimen Description FLUID ASCITIC COLLECTED BY DOCTOR BOLES   Final   Special Requests BOTTLES DRAWN AEROBIC AND ANAEROBIC 10CC   Final   Culture NO GROWTH 5 DAYS   Final   Report Status 03/14/2013 FINAL   Final  GRAM STAIN     Status: None   Collection Time    03/09/13  2:12 PM      Result Value Range Status   Specimen Description ASCITIC   Final   Special Requests NONE   Final   Gram Stain     Final   Value: NO ORGANISMS SEEN     FEW WBC SEEN   Report Status 03/09/2013  FINAL   Final  MRSA PCR SCREENING     Status: Abnormal   Collection Time    03/19/13  3:00 AM      Result Value Range Status   MRSA by PCR POSITIVE (*) NEGATIVE Final   Comment:            The GeneXpert MRSA Assay (FDA     approved for NASAL specimens     only), is one component of a     comprehensive MRSA colonization     surveillance program. It is not     intended to diagnose MRSA     infection nor to guide or     monitor treatment for     MRSA infections.     RESULT CALLED TO, READ BACK BY AND VERIFIED WITH:     NEILSON,T @0530  ON 03/19/13 BY Anner Crete     Studies: Dg Chest Port 1 View  03/18/2013   CLINICAL DATA:  Abdominal pain. Shortness of breath.  EXAM: PORTABLE CHEST - 1 VIEW  COMPARISON:  03/09/2013  FINDINGS: Emphysema noted with stable right pleural thickening. Indistinct opacity medially at the right lung base. Heart size within normal limits for technique. Atherosclerotic aortic arch noted. Mild interstitial accentuation noted bilaterally, likely chronic.  IMPRESSION: 1. Suspected small right pleural effusion. 2. Emphysema. 3. Ill-defined density at the right medial lung base. This has been previously characterized as rounded atelectasis on prior CT scans.   Electronically Signed   By: Herbie Baltimore   On: 03/18/2013 20:13    Scheduled Meds: . Chlorhexidine Gluconate Cloth  6 each Topical Q0600  . insulin aspart  0-9 Units Subcutaneous Q4H  . mupirocin ointment  1 application Nasal BID  . sodium chloride  3 mL Intravenous Q12H   Continuous Infusions: . 0.9 % NaCl with KCl 20 mEq / L Stopped (03/19/13 0138)  . pantoprozole (PROTONIX) infusion Stopped (03/19/13 0138)    Active Problems:   Alcoholic cirrhosis/remote TIPS   Diabetes mellitus type 2, uncontrolled, with complications   Acute blood loss anemia   Abdominal pain   GAVE (gastric antral vascular ectasia)   Upper GI bleeding   ESRD on dialysis   Decubitus ulcer of sacral region, stage 2    Time spent:  35 minutes    [facility] M  [facility] Pager [phone]. If 7PM-7AM, please contact night-coverage at [email or web address], password [facility] 03/19/2013, 10:21 AM  LOS: 1 day   Attending: Patient seen and examined. His hemodynamic stable. I've spoken with gastroenterology, Dr. Karilyn Cota, who is arranging transfer for this patient  to [facility].

## 2013-03-20 LAB — TYPE AND SCREEN
ABO/RH(D): B NEG
Unit division: 0

## 2013-03-20 LAB — GLUCOSE, CAPILLARY: Glucose-Capillary: 92 mg/dL (ref 70–99)

## 2013-03-26 ENCOUNTER — Emergency Department (HOSPITAL_COMMUNITY): Payer: Medicare Other

## 2013-03-26 ENCOUNTER — Encounter (HOSPITAL_COMMUNITY): Payer: Self-pay | Admitting: Emergency Medicine

## 2013-03-26 ENCOUNTER — Telehealth (INDEPENDENT_AMBULATORY_CARE_PROVIDER_SITE_OTHER): Payer: Self-pay | Admitting: *Deleted

## 2013-03-26 ENCOUNTER — Observation Stay (HOSPITAL_COMMUNITY)
Admission: EM | Admit: 2013-03-26 | Discharge: 2013-03-27 | Disposition: A | Discharge status: Skilled Nursing Facility | Payer: Medicare Other | Attending: Internal Medicine | Admitting: Internal Medicine

## 2013-03-26 DIAGNOSIS — L89152 Pressure ulcer of sacral region, stage 2: Secondary | ICD-10-CM

## 2013-03-26 DIAGNOSIS — K922 Gastrointestinal hemorrhage, unspecified: Secondary | ICD-10-CM | POA: Insufficient documentation

## 2013-03-26 DIAGNOSIS — N186 End stage renal disease: Secondary | ICD-10-CM

## 2013-03-26 DIAGNOSIS — K31819 Angiodysplasia of stomach and duodenum without bleeding: Secondary | ICD-10-CM | POA: Insufficient documentation

## 2013-03-26 DIAGNOSIS — K7290 Hepatic failure, unspecified without coma: Secondary | ICD-10-CM

## 2013-03-26 DIAGNOSIS — D62 Acute posthemorrhagic anemia: Secondary | ICD-10-CM | POA: Insufficient documentation

## 2013-03-26 DIAGNOSIS — D696 Thrombocytopenia, unspecified: Secondary | ICD-10-CM | POA: Insufficient documentation

## 2013-03-26 DIAGNOSIS — R109 Unspecified abdominal pain: Secondary | ICD-10-CM

## 2013-03-26 DIAGNOSIS — F1020 Alcohol dependence, uncomplicated: Secondary | ICD-10-CM | POA: Insufficient documentation

## 2013-03-26 DIAGNOSIS — G629 Polyneuropathy, unspecified: Secondary | ICD-10-CM

## 2013-03-26 DIAGNOSIS — R079 Chest pain, unspecified: Secondary | ICD-10-CM

## 2013-03-26 DIAGNOSIS — R0789 Other chest pain: Principal | ICD-10-CM | POA: Insufficient documentation

## 2013-03-26 DIAGNOSIS — IMO0001 Reserved for inherently not codable concepts without codable children: Secondary | ICD-10-CM | POA: Insufficient documentation

## 2013-03-26 DIAGNOSIS — E7220 Disorder of urea cycle metabolism, unspecified: Secondary | ICD-10-CM

## 2013-03-26 DIAGNOSIS — H3321 Serous retinal detachment, right eye: Secondary | ICD-10-CM

## 2013-03-26 DIAGNOSIS — E871 Hypo-osmolality and hyponatremia: Secondary | ICD-10-CM | POA: Diagnosis not present

## 2013-03-26 DIAGNOSIS — Z992 Dependence on renal dialysis: Secondary | ICD-10-CM | POA: Insufficient documentation

## 2013-03-26 DIAGNOSIS — E440 Moderate protein-calorie malnutrition: Secondary | ICD-10-CM

## 2013-03-26 DIAGNOSIS — R630 Anorexia: Secondary | ICD-10-CM

## 2013-03-26 DIAGNOSIS — R609 Edema, unspecified: Secondary | ICD-10-CM

## 2013-03-26 DIAGNOSIS — E1165 Type 2 diabetes mellitus with hyperglycemia: Secondary | ICD-10-CM | POA: Diagnosis present

## 2013-03-26 DIAGNOSIS — R6510 Systemic inflammatory response syndrome (SIRS) of non-infectious origin without acute organ dysfunction: Secondary | ICD-10-CM

## 2013-03-26 DIAGNOSIS — K7030 Alcoholic cirrhosis of liver without ascites: Secondary | ICD-10-CM

## 2013-03-26 DIAGNOSIS — I120 Hypertensive chronic kidney disease with stage 5 chronic kidney disease or end stage renal disease: Secondary | ICD-10-CM | POA: Insufficient documentation

## 2013-03-26 DIAGNOSIS — R4182 Altered mental status, unspecified: Secondary | ICD-10-CM

## 2013-03-26 DIAGNOSIS — E43 Unspecified severe protein-calorie malnutrition: Secondary | ICD-10-CM

## 2013-03-26 DIAGNOSIS — R000 Tachycardia, unspecified: Secondary | ICD-10-CM

## 2013-03-26 DIAGNOSIS — E876 Hypokalemia: Secondary | ICD-10-CM | POA: Diagnosis present

## 2013-03-26 LAB — CBC WITH DIFFERENTIAL/PLATELET
Basophils Absolute: 0.1 10*3/uL (ref 0.0–0.1)
Basophils Relative: 1 % (ref 0–1)
Eosinophils Absolute: 0.5 10*3/uL (ref 0.0–0.7)
Lymphs Abs: 1.2 10*3/uL (ref 0.7–4.0)
MCH: 30.2 pg (ref 26.0–34.0)
Neutrophils Relative %: 71 % (ref 43–77)
Platelets: 188 10*3/uL (ref 150–400)
RBC: 2.45 MIL/uL — ABNORMAL LOW (ref 4.22–5.81)

## 2013-03-26 LAB — HEPATIC FUNCTION PANEL
ALT: 14 U/L (ref 0–53)
Albumin: 1.6 g/dL — ABNORMAL LOW (ref 3.5–5.2)
Alkaline Phosphatase: 169 U/L — ABNORMAL HIGH (ref 39–117)
Total Bilirubin: 1.2 mg/dL (ref 0.3–1.2)
Total Protein: 6 g/dL (ref 6.0–8.3)

## 2013-03-26 LAB — TROPONIN I
Troponin I: <0.3 ng/mL (ref <0.30)
Troponin I: <0.3 ng/mL (ref <0.30)

## 2013-03-26 LAB — BASIC METABOLIC PANEL
GFR calc Af Amer: 15 mL/min — ABNORMAL LOW (ref >90)
GFR calc non Af Amer: 13 mL/min — ABNORMAL LOW (ref >90)
Glucose, Bld: 160 mg/dL — ABNORMAL HIGH (ref 70–99)
Potassium: 3.4 mEq/L — ABNORMAL LOW (ref 3.5–5.1)
Sodium: 134 mEq/L — ABNORMAL LOW (ref 135–145)

## 2013-03-26 LAB — LIPID PANEL
Cholesterol: 74 mg/dL (ref 0–200)
HDL: 17 mg/dL — ABNORMAL LOW (ref >39)
Total CHOL/HDL Ratio: 4.4 RATIO
Triglycerides: 56 mg/dL (ref <150)

## 2013-03-26 MED ORDER — ACETAMINOPHEN 325 MG PO TABS: 650.0000 mg | ORAL_TABLET | Freq: Four times a day (QID) | ORAL | Status: DC | PRN

## 2013-03-26 MED ORDER — SEVELAMER CARBONATE 800 MG PO TABS
1600.0000 mg | ORAL_TABLET | Freq: Three times a day (TID) | ORAL | Status: DC
  Administered 2013-03-26 – 2013-03-27 (×2): 1600 mg via ORAL
  Filled 2013-03-26 (×3): qty 2

## 2013-03-26 MED ORDER — SUCRALFATE 1 GM/10ML PO SUSP
1.0000 g | Freq: Four times a day (QID) | ORAL | Status: DC
  Administered 2013-03-26 – 2013-03-27 (×3): 1 g via ORAL
  Filled 2013-03-26 (×3): qty 10

## 2013-03-26 MED ORDER — PANTOPRAZOLE SODIUM 40 MG PO TBEC
40.0000 mg | DELAYED_RELEASE_TABLET | Freq: Two times a day (BID) | ORAL | Status: DC
  Administered 2013-03-26 – 2013-03-27 (×2): 40 mg via ORAL
  Filled 2013-03-26 (×2): qty 1

## 2013-03-26 MED ORDER — ALUM & MAG HYDROXIDE-SIMETH 200-200-20 MG/5ML PO SUSP: 30.0000 mL | Freq: Four times a day (QID) | ORAL | Status: DC | PRN

## 2013-03-26 MED ORDER — FERROUS SULFATE 325 (65 FE) MG PO TABS
325.0000 mg | ORAL_TABLET | Freq: Two times a day (BID) | ORAL | Status: DC
  Administered 2013-03-26 – 2013-03-27 (×2): 325 mg via ORAL
  Filled 2013-03-26 (×2): qty 1

## 2013-03-26 MED ORDER — MUPIROCIN 2 % EX OINT
1.0000 "application " | TOPICAL_OINTMENT | Freq: Two times a day (BID) | CUTANEOUS | Status: DC
  Administered 2013-03-26 – 2013-03-27 (×2): 1 via NASAL
  Filled 2013-03-26: qty 22

## 2013-03-26 MED ORDER — ASPIRIN EC 81 MG PO TBEC
81.0000 mg | DELAYED_RELEASE_TABLET | Freq: Every day | ORAL | Status: DC
  Filled 2013-03-26 (×2): qty 1

## 2013-03-26 MED ORDER — MORPHINE SULFATE 2 MG/ML IJ SOLN
1.0000 mg | INTRAMUSCULAR | Status: DC | PRN
  Administered 2013-03-26 – 2013-03-27 (×4): 1 mg via INTRAVENOUS
  Filled 2013-03-26 (×4): qty 1

## 2013-03-26 MED ORDER — SODIUM CHLORIDE 0.9 % IJ SOLN: 3.0000 mL | Freq: Two times a day (BID) | INTRAMUSCULAR | Status: DC

## 2013-03-26 MED ORDER — LACTULOSE 10 GM/15ML PO SOLN
20.0000 g | Freq: Every day | ORAL | Status: DC
  Administered 2013-03-27: 20 g via ORAL
  Filled 2013-03-26 (×2): qty 30

## 2013-03-26 MED ORDER — INSULIN ASPART 100 UNIT/ML ~~LOC~~ SOLN
0.0000 [IU] | Freq: Three times a day (TID) | SUBCUTANEOUS | Status: DC
  Administered 2013-03-26: 3 [IU] via SUBCUTANEOUS
  Administered 2013-03-27: 2 [IU] via SUBCUTANEOUS

## 2013-03-26 MED ORDER — CIPROFLOXACIN HCL 250 MG PO TABS
500.0000 mg | ORAL_TABLET | Freq: Every day | ORAL | Status: DC
  Administered 2013-03-27: 500 mg via ORAL
  Filled 2013-03-26: qty 2

## 2013-03-26 MED ORDER — MORPHINE SULFATE 4 MG/ML IJ SOLN
2.0000 mg | Freq: Once | INTRAMUSCULAR | Status: AC
  Administered 2013-03-26: 2 mg via INTRAVENOUS
  Filled 2013-03-26: qty 1

## 2013-03-26 MED ORDER — ACETAMINOPHEN 650 MG RE SUPP: 650.0000 mg | Freq: Four times a day (QID) | RECTAL | Status: DC | PRN

## 2013-03-26 MED ORDER — SODIUM CHLORIDE 0.9 % IJ SOLN
3.0000 mL | Freq: Two times a day (BID) | INTRAMUSCULAR | Status: DC
  Administered 2013-03-26 – 2013-03-27 (×2): 3 mL via INTRAVENOUS

## 2013-03-26 MED ORDER — FOLIC ACID 400 MCG PO TABS: 400.0000 ug | ORAL_TABLET | Freq: Every day | ORAL | Status: DC

## 2013-03-26 MED ORDER — SODIUM CHLORIDE 0.9 % IJ SOLN: 3.0000 mL | INTRAMUSCULAR | Status: DC | PRN

## 2013-03-26 MED ORDER — ASPIRIN 81 MG PO CHEW: 324.0000 mg | CHEWABLE_TABLET | Freq: Once | ORAL | Status: DC

## 2013-03-26 MED ORDER — METOCLOPRAMIDE HCL 10 MG PO TABS
5.0000 mg | ORAL_TABLET | Freq: Three times a day (TID) | ORAL | Status: DC
  Administered 2013-03-26 – 2013-03-27 (×4): 5 mg via ORAL
  Filled 2013-03-26 (×4): qty 1

## 2013-03-26 MED ORDER — IOHEXOL 350 MG/ML SOLN
100.0000 mL | Freq: Once | INTRAVENOUS | Status: AC | PRN
  Administered 2013-03-26: 100 mL via INTRAVENOUS

## 2013-03-26 MED ORDER — TRAMADOL HCL 50 MG PO TABS: 50.0000 mg | ORAL_TABLET | Freq: Three times a day (TID) | ORAL | Status: DC | PRN

## 2013-03-26 MED ORDER — NITROGLYCERIN 0.4 MG SL SUBL: 0.4000 mg | SUBLINGUAL_TABLET | SUBLINGUAL | Status: DC | PRN

## 2013-03-26 MED ORDER — ONDANSETRON HCL 4 MG PO TABS: 4.0000 mg | ORAL_TABLET | Freq: Four times a day (QID) | ORAL | Status: DC | PRN

## 2013-03-26 MED ORDER — ONDANSETRON HCL 4 MG/2ML IJ SOLN: 4.0000 mg | Freq: Four times a day (QID) | INTRAMUSCULAR | Status: DC | PRN

## 2013-03-26 MED ORDER — CHLORHEXIDINE GLUCONATE CLOTH 2 % EX PADS
6.0000 | MEDICATED_PAD | Freq: Every day | CUTANEOUS | Status: DC
  Administered 2013-03-27: 6 via TOPICAL

## 2013-03-26 MED ORDER — FOLIC ACID 1 MG PO TABS
0.5000 mg | ORAL_TABLET | Freq: Every day | ORAL | Status: DC
  Administered 2013-03-26 – 2013-03-27 (×2): 0.5 mg via ORAL
  Filled 2013-03-26 (×2): qty 1

## 2013-03-26 MED ORDER — SODIUM CHLORIDE 0.9 % IV SOLN: 250.0000 mL | INTRAVENOUS | Status: DC | PRN

## 2013-03-26 NOTE — H&P (Signed)
[facility] History and Physical  Timothy Chan:[id number] DOB: [birth year] DOA: 03/26/2013  Referring physician:  PCP: Ignatius Specking., MD  Specialists:   Chief Complaint: Chest pain  HPI: Timothy Chan is a 62 y.o. male is a resident of [facility] skilled nursing facility with a past medical history of alcoholic liver disease status post TI PS, end-stage renal disease on Monday Wednesday Friday hemodialysis who presents to the emergency department today with the chief complaint of chest pain. Information is obtained from the patient. He states that he was about 40 minutes into his dialysis treatment when he developed sharp steady chest pain. He's date the location of the pain was left anterior and it radiated to his shoulder and left side of his neck as well as to his back. He describes the pain as squeezing-like. He rates the pain an 8/10 at best 5-10 at worst. He states that the dialysis staff "got scared" and sent him to the hospital. He denies any palpitations shortness of breath nausea vomiting headache visual disturbances syncope or near-syncope. He denies any recent fever chills or sick contacts. He does indicate he has a history of tachycardia and lower extremity edema. He states that the pain subsided after he was given a shot of morphine. In the emergency department he was given 324 mg of aspirin and 2 mg morphine. Visual evaluation in the emergency room yields a hemoglobin of 7.4 sodium 134 potassium 3.4 creatinine 4.5. Chest x-ray yields stable trace bilateral pleural effusions or thickening. CT angioedema chest yields no evidence of acute pulmonary thromboembolism. Extensive volume loss in the right lung. Small bilateral loculated pleural effusions. On the right it is not significantly changed. On the left is new. Reticular nodular opacities at the left lung base worrisome for inflammatory process. EKG yields sinus tachycardia. Unchanged from last month EKG. Symptoms came on suddenly  have resolved characterized as moderate. [facility] are asked to admit  Review of Systems: 10 point review of systems complete all systems are negative except as indicated in history of present illness  Past Medical History  Diagnosis Date  . Rectal bleeding   . Dialysis care   . GI bleed   . Hypertension   . Esophageal varices   . Alcoholic liver disease   . Hepatic encephalopathy   . Ascites   . Diabetes mellitus   . Detached retina   . Pneumonia   . 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 ESRD on hemodialysis    Past Surgical History  Procedure Laterality Date  . Esophagogastroduodenoscopy  12/31/2010    EGD APC THERAPY  . Esophagogastroduodenoscopy  05/31/2012E    GD APC ABLATION  . Small bowel givens  12/01/2010  . Colonoscopy  09/07/2010  . Esophagogastroduodenoscopy  09/05/2010    OUTLAW  . Lung surgery  6/11    [place]  . Esophagogastroduodenoscopy  03/26/2011    Procedure: ESOPHAGOGASTRODUODENOSCOPY (EGD);  Surgeon: Malissa Hippo, MD;  [facility];  Service: Endoscopy;  Laterality: N/A;  8:30 am  . Hot hemostasis  03/26/2011    Procedure: HOT HEMOSTASIS (ARGON PLASMA COAGULATION/BICAP);  Surgeon: Malissa Hippo, MD;  [facility];  Service: Endoscopy;  Laterality: N/A;  . Stent in liver    . Esophagogastroduodenoscopy N/A 10/24/2012    Procedure: ESOPHAGOGASTRODUODENOSCOPY (EGD);  Surgeon: Malissa Hippo, MD;  [facility];  Service: Endoscopy;  Laterality: N/A;  . Esophagogastroduodenoscopy N/A 11/22/2012  Procedure: ESOPHAGOGASTRODUODENOSCOPY (EGD);  Surgeon: Charna Elizabeth, MD;  Location: [facility] ENDOSCOPY;  Service: Endoscopy;  Laterality: N/A;  . Paracentesis    . Esophagogastroduodenoscopy N/A 12/21/2012    Procedure: ESOPHAGOGASTRODUODENOSCOPY (EGD);  Surgeon: Malissa Hippo, MD;  [facility];  Service: Endoscopy;  Laterality: N/A;  250-moved to 155 Ann to notify pt  . Hot hemostasis  N/A 12/21/2012    Procedure: HOT HEMOSTASIS (ARGON PLASMA COAGULATION/BICAP);  Surgeon: Malissa Hippo, MD;  [facility];  Service: Endoscopy;  Laterality: N/A;  . Esophagogastroduodenoscopy N/A 01/25/2013    Procedure: ESOPHAGOGASTRODUODENOSCOPY (EGD);  Surgeon: Malissa Hippo, MD;  [facility];  Service: Endoscopy;  Laterality: N/A;  100  . Hot hemostasis N/A 01/25/2013    Procedure: HOT HEMOSTASIS (ARGON PLASMA COAGULATION/BICAP);  Surgeon: Malissa Hippo, MD;  [facility];  Service: Endoscopy;  Laterality: N/A;  . Esophagogastroduodenoscopy N/A 02/16/2013    Procedure: ESOPHAGOGASTRODUODENOSCOPY (EGD);  Surgeon: Malissa Hippo, MD;  [facility];  Service: Endoscopy;  Laterality: N/A;  . Esophagogastroduodenoscopy N/A 03/07/2013    Procedure: ESOPHAGOGASTRODUODENOSCOPY (EGD);  Surgeon: Malissa Hippo, MD;  [facility];  Service: Endoscopy;  Laterality: N/A;  . Esophagogastroduodenoscopy N/A 03/09/2013    Procedure: ESOPHAGOGASTRODUODENOSCOPY (EGD);  Surgeon: Malissa Hippo, MD;  [facility];  Service: Endoscopy;  Laterality: N/A;   Social History:  reports that he quit smoking about 19 months ago. His smoking use included Cigarettes. He has a 30 pack-year smoking history. He quit smokeless tobacco use about 10 months ago. He reports that he does not drink alcohol or use illicit drugs. He is a resident at [facility]. He is nonambulatory. Allergies  Allergen Reactions  . Tylenol [Acetaminophen] Other (See Comments)    cirrhosis    History reviewed. No pertinent family history. family medical history reviewed and not pertinent to the admission of this gentleman  Prior to Admission medications   Medication Sig Start Date End Date Taking? Authorizing Provider  ciprofloxacin (CIPRO) 500 MG tablet Take 500 mg by mouth once. 02/27/13  Yes Richarda Overlie, MD  ferrous sulfate 325 (65 FE) MG tablet Take 1 tablet (325 mg total) by  mouth 2 (two) times daily with a meal. 03/16/13  Yes Standley Brooking, MD  folic acid (FOLVITE) 400 MCG tablet Take 400 mcg by mouth daily.   Yes Historical Provider, MD  hydrocortisone valerate ointment (WESTCORT) 0.2 % Apply 1 application topically every Monday, Wednesday, and Friday. For numbing prior to dialysis   Yes Historical Provider, MD  lactulose (CHRONULAC) 10 GM/15ML solution Take 20 g by mouth daily.  11/25/12  Yes Christiane Ha, MD  metoCLOPramide (REGLAN) 5 MG tablet Take 1 tablet (5 mg total) by mouth 4 (four) times daily -  before meals and at bedtime. 03/16/13  Yes Standley Brooking, MD  pantoprazole (PROTONIX) 40 MG tablet Take 1 tablet (40 mg total) by mouth 2 (two) times daily before a meal. 02/27/13  Yes Richarda Overlie, MD  sevelamer carbonate (RENVELA) 800 MG tablet Take 2 tablets (1,600 mg total) by mouth 3 (three) times daily with meals. 02/27/13  Yes Richarda Overlie, MD  sucralfate (CARAFATE) 1 GM/10ML suspension Take 10 mLs (1 g total) by mouth 4 (four) times daily. 01/15/13  Yes Flint Melter, MD  ondansetron (ZOFRAN) 4 MG tablet Take 4 mg by mouth daily as needed for nausea.     Historical Provider, MD  traMADol Janean Sark)  50 MG tablet Take 1 tablet (50 mg total) by mouth 3 (three) times daily as needed for pain. 03/19/13   Kermit Balo, DO   Physical Exam: Filed Vitals:   03/26/13 1531  BP: 100/53  Pulse: 110  Temp: 98.7 F (37.1 C)  Resp: 20     General:   Developed no acute distress  Eyes: PERRLA, EOMI,  ENT: Ears clear nose without drainage oropharynx without erythema or exudate. Mucous membranes slightly pale and dry  Neck: Supple no JVD full range of motion  Cardiovascular: Tachycardic but regular no murmur no gallop no rub trace to 1+ lower extremity edema  Respiratory: Normal effort breath sounds somewhat distant. Faint crackles in bilateral bases. No wheeze  Abdomen: Soft positive bowel sounds but very sluggish, no distention non-tender to palpate,  tenderness to palpation particularly in the upper quadrants somewhat distended  Skin: Warm and dry no rash no lesions  Musculoskeletal: No clubbing no cyanosis  Psychiatric: Calm cooperative  Neurologic: Cranial nerves II through XII grossly intact speech clear  Labs on Admission:  Basic Metabolic Panel:  Recent Labs Lab 03/26/13 1203  NA 134*  K 3.4*  CL 97  CO2 28  GLUCOSE 160*  BUN 22  CREATININE 4.52*  CALCIUM 8.3*   Liver Function Tests: No results found for this basename: AST, ALT, ALKPHOS, BILITOT, PROT, ALBUMIN,  in the last 168 hours No results found for this basename: LIPASE, AMYLASE,  in the last 168 hours No results found for this basename: AMMONIA,  in the last 168 hours CBC:  Recent Labs Lab 03/26/13 1203  WBC 9.6  NEUTROABS 6.9  HGB 7.4*  HCT 22.5*  MCV 91.8  PLT 188   Cardiac Enzymes:  Recent Labs Lab 03/26/13 1203  TROPONINI <0.30    BNP (last 3 results)  Recent Labs  11/21/12 1145  PROBNP 1167.0*   CBG: No results found for this basename: GLUCAP,  in the last 168 hours  Radiological Exams on Admission: Ct Angio Chest Pe W/cm &/or Wo Cm  03/26/2013   *RADIOLOGY REPORT*  Clinical Data: Short of breath  CT ANGIOGRAPHY CHEST  Technique:  Multidetector CT imaging of the chest using the standard protocol during bolus administration of intravenous contrast. Multiplanar reconstructed images including MIPs were obtained and reviewed to evaluate the vascular anatomy.  Contrast: OMNIPAQUE IOHEXOL 350 MG/ML SOLN  Comparison: Abdomen CT 10/27/2012  Findings: There are no filling defects in the pulmonary arterial tree to suggest acute pulmonary thromboembolism.  Atherosclerotic changes of the aortic arch and descending aorta are noted.  Mild peripheral calcifications of the aortic valve.  Three- vessel advance coronary artery calcifications are present.  There is calcified plaque in the proximal left subclavian artery without obvious significant  narrowing.  Left common carotid artery is patent.  Innominate arteries patent.  Right common carotid artery and subclavian artery grossly patent.  Bilateral vertebral arteries are also grossly patent.  13 mm right paratracheal lymph node on image 30.  Other smaller mediastinal nodes are scattered.  11 mm right paratracheal node on image 14 at the thoracic inlet.  There is a small loculated pleural effusion at the right lung base and is not significantly changed.  An adjacent mass-like area of round consolidation in the right lower lobe measuring 4.6 cm is not significantly changed.  There is extensive volume loss in the right lower lung.  Linear opacities extend from this round area to the hilum and this has been previously characterized  and round atelectasis.  A small left pleural effusion primarily posteriorly is slightly increased in size.  It is somewhat irregular and also appears loculated.  Reticulonodular opacities are present at the left lung base have increased and are most consistent with an inflammatory process. Primarily linear opacities are present at the right lung base compatible with volume loss.  Minimal T4 and T5 superior end plate depressions at a chronic appearance.  No definite acute bony deformity.  There are calcifications at the dome of the right hemidiaphragm adjacent to a partially calcified liver lesion.  This is not significantly changed.  A TIPS device is in place.  Diffuse subcutaneous fat edema. Small amount of fluid density is present in the abdomen anterior to the spleen.  IMPRESSION: No evidence of acute pulmonary thromboembolism.  There is extensive volume loss in the right lung.  Round atelectasis is present at the right lung base.  Small bilateral loculated pleural effusions.  On the right, it is not significantly changed.  On the left it is new.  Reticulonodular opacities at the left lung base worrisome for inflammatory process.  Mild mediastinal adenopathy.  Differential  diagnosis includes both inflammatory and neoplastic etiology.  Correlate clinically as for the need for follow-up chest CT to ensure resolution.  Stable findings in the abdomen.   Original Report Authenticated By: Jolaine Click, M.D.   Dg Chest Port 1 View  03/26/2013   *RADIOLOGY REPORT*  Clinical Data: Chest pain and shortness of breath  PORTABLE CHEST - 1 VIEW  Comparison: 03/18/2013  Findings: Chronically prominent interstitial markings noted without focal opacity or evidence for edema.  Trace pleural effusions or thickening again noted.  No new focal pulmonary opacity.  No pneumothorax.  No acute osseous finding.  IMPRESSION: Stable trace bilateral pleural effusions or thickening.  CT is scheduled for later today and will be dictated under separate report.   Original Report Authenticated By: Christiana Pellant, M.D.    EKG: Independently reviewed sinus tachycardia.  Assessment/Plan Principal Problem:   Chest pain: Atypical. May be related to dialysis treatment. Will admit to telemetry to rule out. Initial EKG reassuring. Initial troponin negative. Will cycle his cardiac enzymes will repeat EKG in the a.m. Patient pain free in the emergency department. Will continue morphine and sublingual mitral oh as needed. We'll provide Zofran for antinausea as needed. Monitor oxygen saturation level and provide oxygen supplementation as indicated. Active Problems:    Alcoholic cirrhosis/remote TIPS: Appears stable at baseline. Will continue his lactulose.    Diabetes mellitus type 2, uncontrolled, hemoglobin A1c 4.6 last month. Dr. Cherlyn Labella and provide sliding scale insulin for optimal glycemic control.    CKD (chronic kidney disease) stage V requiring chronic dialysis: Patient is on Monday Wednesday Friday dialysis. His current creatinine of 4.5 to is close to baseline. Of note he was not able to complete his dialysis treatment today. Patient is not discharged tomorrow will consider consulting nephrology.     Thrombocytopenia, unspecified: Related to her 2. Currently his platelets are 188. Chart review indicates this is better than his baseline. No signs symptoms of active bleeding. Will monitor closely    GAVE (gastric antral vascular ectasia). 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 Last month  he underwent three EGDs with APC ablation. Since June 2014 he has undergone 5 therapeutic EGDs and they provided minimal/transient benefit. He received 9 units of PRBCs during recent hospitalization and he has received 2 units of PRBCs on most recent  hospitalization 10 days ago. He was evaluated again by Dr Karilyn Cota who opined that what he really needs is a para renal transplant but at this point he is not a candidate. He may be bleeding not only from gastric antral vascular ectasia he may have more lesions involving the small intestine. Of note, during last hospitalization, patient's condition discussed with Dr. Harlen Labs of gastroenterology service at [facility] and he was  transferred to [facility]. He was discharged from there 2 days ago.   GI bleed: see above. Hemoglobin appears to be stable at baseline.  Acute blood loss anemia: See of same given the above. Currently hemoglobin appears to be stable at baseline. Will monitor closely.   Code Status: Full. Of note during his last hospitalization provider is met with patient and his family for a discussion regarding palliative care. Patient not interested at that time  Family Communication: None at bedside Disposition Plan: Back to [facility] hopefully tomorrow  Time spent: 60 minutes  Gwenyth Bender [facility] Pager [phone]  If 7PM-7AM, please contact night-coverage [email or web address] Password [facility] 03/26/2013, 3:58 PM

## 2013-03-26 NOTE — Telephone Encounter (Signed)
I talked with patient's son and explained to him via patient was transferred to [facility]. Transfer was primarily due to recurrent GI bleed and not for liver transplant

## 2013-03-26 NOTE — ED Notes (Signed)
Per son patient recently released from [facility] for bleeding ulcers. Patient's son states that while having dialysis here at [facility], before being transported to [facility], patient's blood pressure would drop and they would have to stop intermittently to allow blood pressure to return to it's normal. Son also states that while patient was at [facility] they found a large mass on his liver, unable to do CT or biopsy.

## 2013-03-26 NOTE — H&P (Signed)
patient seen and examined. Chest pain currently resolved. Has multiple underlying GI / liver issues and on HD.  Agree with ruling out ACS with serial cardiac enzymes. No EKG changes noted. Hb of 7.4 which is slightly lower than his baseline. Monitor closely.

## 2013-03-26 NOTE — Telephone Encounter (Signed)
Dr.Rehman was made aware, and is calling Alinda Money.

## 2013-03-26 NOTE — ED Provider Notes (Signed)
CSN: [id number]     Arrival date & time 03/26/13  1103 History  This chart was scribed for Vida Roller, MD by Quintella Reichert, ED scribe.  This patient was seen in room APA08/APA08 and the patient's care was started at 11:43 AM.  Chief Complaint  Patient presents with  . Chest Pain  . Headache  . Neck Pain    The history is provided by the patient. No language interpreter was used.    HPI Comments: Timothy Chan is a 62 y.o. male with h/o cirrhosis, ESRD on hemodialysis, hepatic encephalopathy, ascites who presents to the Emergency Department complaining of an acute-onset episode of stabbing, squeezing left-sided chest pain radiating into the left back.  Episode began while pt was in dialysis, 30-45 minutes into treatment.  CP was not associated with fevers but was associated with tachycardia and leg swelling.  Pt states he has had an elevated HR up to 120 for the past 2 weeks.  He also states that both legs are more swollen than usual.  He denies SOB, dizziness, nausea or vomiting.  Pt states he has been to the ED 1-2 times for the same symptoms.  He denies h/o cardiac problems.  He does admit to h/o PE.  He is not on anticoagulants.    PCP is Dr. Sherril Croon   Past Medical History  Diagnosis Date  . Rectal bleeding   . Dialysis care   . GI bleed   . Hypertension   . Esophageal varices   . Alcoholic liver disease   . Hepatic encephalopathy   . Ascites   . Diabetes mellitus   . Detached retina   . Pneumonia   . 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 ESRD on hemodialysis     Past Surgical History  Procedure Laterality Date  . Esophagogastroduodenoscopy  12/31/2010    EGD [facility] THERAPY  . Esophagogastroduodenoscopy  05/31/2012E    GD [facility] ABLATION  . Small bowel givens  12/01/2010  . Colonoscopy  09/07/2010  . Esophagogastroduodenoscopy  09/05/2010    OUTLAW  . Lung surgery  6/11    [place]  . Esophagogastroduodenoscopy  03/26/2011    Procedure:  ESOPHAGOGASTRODUODENOSCOPY (EGD);  Surgeon: Malissa Hippo, MD;  Location[facility];  Service: Endoscopy;  Laterality: N/A;  8:30 am  . Hot hemostasis  03/26/2011    Procedure: HOT HEMOSTASIS (ARGON PLASMA COAGULATION/BICAP);  Surgeon: Malissa Hippo, MD;  Location: [facility];  Service: Endoscopy;  Laterality: N/A;  . Stent in liver    . Esophagogastroduodenoscopy N/A 10/24/2012    Procedure: ESOPHAGOGASTRODUODENOSCOPY (EGD);  Surgeon: Malissa Hippo, MD;  [facility];  Service: Endoscopy;  Laterality: N/A;  . Esophagogastroduodenoscopy N/A 11/22/2012    Procedure: ESOPHAGOGASTRODUODENOSCOPY (EGD);  Surgeon: Charna Elizabeth, MD;  Location: [facility] ENDOSCOPY;  Service: Endoscopy;  Laterality: N/A;  . Paracentesis    . Esophagogastroduodenoscopy N/A 12/21/2012    Procedure: ESOPHAGOGASTRODUODENOSCOPY (EGD);  Surgeon: Malissa Hippo, MD;  [facility];  Service: Endoscopy;  Laterality: N/A;  250-moved to 155 Ann to notify pt  . Hot hemostasis N/A 12/21/2012    Procedure: HOT HEMOSTASIS (ARGON PLASMA COAGULATION/BICAP);  Surgeon: Malissa Hippo, MD;  [facility];  Service: Endoscopy;  Laterality: N/A;  . Esophagogastroduodenoscopy N/A 01/25/2013    Procedure: ESOPHAGOGASTRODUODENOSCOPY (EGD);  Surgeon: Malissa Hippo, MD;  [facility];  Service: Endoscopy;  Laterality: N/A;  100  .  Hot hemostasis N/A 01/25/2013    Procedure: HOT HEMOSTASIS (ARGON PLASMA COAGULATION/BICAP);  Surgeon: Malissa Hippo, MD;  [facility];  Service: Endoscopy;  Laterality: N/A;  . Esophagogastroduodenoscopy N/A 02/16/2013    Procedure: ESOPHAGOGASTRODUODENOSCOPY (EGD);  Surgeon: Malissa Hippo, MD;  [facility];  Service: Endoscopy;  Laterality: N/A;  . Esophagogastroduodenoscopy N/A 03/07/2013    Procedure: ESOPHAGOGASTRODUODENOSCOPY (EGD);  Surgeon: Malissa Hippo, MD;  [facility];  Service: Endoscopy;  Laterality: N/A;  .  Esophagogastroduodenoscopy N/A 03/09/2013    Procedure: ESOPHAGOGASTRODUODENOSCOPY (EGD);  Surgeon: Malissa Hippo, MD;  [facility];  Service: Endoscopy;  Laterality: N/A;    History reviewed. No pertinent family history.   History  Substance Use Topics  . Smoking status: Former Smoker -- 1.00 packs/day for 30 years    Types: Cigarettes    Quit date: 08/11/2011  . Smokeless tobacco: Former Neurosurgeon    Quit date: 05/12/2012  . Alcohol Use: No     Comment: denies-quit 2000     Review of Systems  All other systems reviewed and are negative.     Allergies  Tylenol  Home Medications   Current Outpatient Rx  Name  Route  Sig  Dispense  Refill  . ciprofloxacin (CIPRO) 500 MG tablet   Oral   Take 500 mg by mouth once.         . ferrous sulfate 325 (65 FE) MG tablet   Oral   Take 1 tablet (325 mg total) by mouth 2 (two) times daily with a meal.         . folic acid (FOLVITE) 400 MCG tablet   Oral   Take 400 mcg by mouth daily.         . hydrocortisone valerate ointment (WESTCORT) 0.2 %   Topical   Apply 1 application topically every Monday, Wednesday, and Friday. For numbing prior to dialysis         . lactulose (CHRONULAC) 10 GM/15ML solution   Oral   Take 20 g by mouth daily.          . metoCLOPramide (REGLAN) 5 MG tablet   Oral   Take 1 tablet (5 mg total) by mouth 4 (four) times daily -  before meals and at bedtime.         . pantoprazole (PROTONIX) 40 MG tablet   Oral   Take 1 tablet (40 mg total) by mouth 2 (two) times daily before a meal.   120 tablet   2   . sevelamer carbonate (RENVELA) 800 MG tablet   Oral   Take 2 tablets (1,600 mg total) by mouth 3 (three) times daily with meals.   180 tablet   2   . sucralfate (CARAFATE) 1 GM/10ML suspension   Oral   Take 10 mLs (1 g total) by mouth 4 (four) times daily.   420 mL   0   . ondansetron (ZOFRAN) 4 MG tablet   Oral   Take 4 mg by mouth daily as needed for nausea.           . traMADol (ULTRAM) 50 MG tablet   Oral   Take 1 tablet (50 mg total) by mouth 3 (three) times daily as needed for pain.   90 tablet   5    BP 111/56  Pulse 117  Temp(Src) 98.2 F (36.8 C) (Oral)  Resp 20  Ht 6\' 3"  (1.905 m)  Wt 220 lb (99.791  kg)  BMI 27.5 kg/m2  SpO2 100%  Physical Exam  Nursing note and vitals reviewed. Constitutional: He appears well-developed and well-nourished. No distress.  HENT:  Head: Normocephalic and atraumatic.  Mouth/Throat: Oropharynx is clear and moist. Mucous membranes are dry. No oropharyngeal exudate.  Eyes: Conjunctivae and EOM are normal. Pupils are equal, round, and reactive to light. Right eye exhibits no discharge. Left eye exhibits no discharge. No scleral icterus.  Neck: Normal range of motion. Neck supple. No JVD present. No thyromegaly present.  Cardiovascular: Normal rate, regular rhythm and normal heart sounds.  Exam reveals no gallop and no friction rub.   No murmur heard. Weak pulses  Pulmonary/Chest: Effort normal. No respiratory distress. He has no wheezes. He has rales.  Scattered rales  Abdominal: Soft. Bowel sounds are normal. He exhibits no distension and no mass. There is no tenderness.  Tympany across upper abdomen Tenderness across upper abdomen Mild anasarca present to the mid abdomen  Musculoskeletal: Normal range of motion. He exhibits no tenderness.  Diffusely swollen lower extremities bilaterally  Lymphadenopathy:    He has no cervical adenopathy.  Neurological: He is alert. Coordination normal.  Skin: Skin is warm and dry. No rash noted. No erythema.  Psychiatric: He has a normal mood and affect. His behavior is normal.    ED Course  Procedures (including critical care time)  DIAGNOSTIC STUDIES: Oxygen Saturation is 100% on room air, normal by my interpretation.    COORDINATION OF CARE: 11:49 AM-Discussed treatment plan which includes imaging and labs with pt at bedside and pt agreed to plan.     Labs Review Labs Reviewed  CBC WITH DIFFERENTIAL - Abnormal; Notable for the following:    RBC 2.45 (*)    Hemoglobin 7.4 (*)    HCT 22.5 (*)    RDW 17.3 (*)    All other components within normal limits  BASIC METABOLIC PANEL - Abnormal; Notable for the following:    Sodium 134 (*)    Potassium 3.4 (*)    Glucose, Bld 160 (*)    Creatinine, Ser 4.52 (*)    Calcium 8.3 (*)    GFR calc non Af Amer 13 (*)    GFR calc Af Amer 15 (*)    All other components within normal limits  TROPONIN I    Imaging Review Ct Angio Chest Pe W/cm &/or Wo Cm  03/26/2013   *RADIOLOGY REPORT*  Clinical Data: Short of breath  CT ANGIOGRAPHY CHEST  Technique:  Multidetector CT imaging of the chest using the standard protocol during bolus administration of intravenous contrast. Multiplanar reconstructed images including MIPs were obtained and reviewed to evaluate the vascular anatomy.  Contrast: OMNIPAQUE IOHEXOL 350 MG/ML SOLN  Comparison: Abdomen CT 10/27/2012  Findings: There are no filling defects in the pulmonary arterial tree to suggest acute pulmonary thromboembolism.  Atherosclerotic changes of the aortic arch and descending aorta are noted.  Mild peripheral calcifications of the aortic valve.  Three- vessel advance coronary artery calcifications are present.  There is calcified plaque in the proximal left subclavian artery without obvious significant narrowing.  Left common carotid artery is patent.  Innominate arteries patent.  Right common carotid artery and subclavian artery grossly patent.  Bilateral vertebral arteries are also grossly patent.  13 mm right paratracheal lymph node on image 30.  Other smaller mediastinal nodes are scattered.  11 mm right paratracheal node on image 14 at the thoracic inlet.  There is a small loculated pleural effusion at  the right lung base and is not significantly changed.  An adjacent mass-like area of round consolidation in the right lower lobe measuring 4.6 cm is  not significantly changed.  There is extensive volume loss in the right lower lung.  Linear opacities extend from this round area to the hilum and this has been previously characterized and round atelectasis.  A small left pleural effusion primarily posteriorly is slightly increased in size.  It is somewhat irregular and also appears loculated.  Reticulonodular opacities are present at the left lung base have increased and are most consistent with an inflammatory process. Primarily linear opacities are present at the right lung base compatible with volume loss.  Minimal T4 and T5 superior end plate depressions at a chronic appearance.  No definite acute bony deformity.  There are calcifications at the dome of the right hemidiaphragm adjacent to a partially calcified liver lesion.  This is not significantly changed.  A TIPS device is in place.  Diffuse subcutaneous fat edema. Small amount of fluid density is present in the abdomen anterior to the spleen.  IMPRESSION: No evidence of acute pulmonary thromboembolism.  There is extensive volume loss in the right lung.  Round atelectasis is present at the right lung base.  Small bilateral loculated pleural effusions.  On the right, it is not significantly changed.  On the left it is new.  Reticulonodular opacities at the left lung base worrisome for inflammatory process.  Mild mediastinal adenopathy.  Differential diagnosis includes both inflammatory and neoplastic etiology.  Correlate clinically as for the need for follow-up chest CT to ensure resolution.  Stable findings in the abdomen.   Original Report Authenticated By: Jolaine Click, M.D.   Dg Chest Port 1 View  03/26/2013   *RADIOLOGY REPORT*  Clinical Data: Chest pain and shortness of breath  PORTABLE CHEST - 1 VIEW  Comparison: 03/18/2013  Findings: Chronically prominent interstitial markings noted without focal opacity or evidence for edema.  Trace pleural effusions or thickening again noted.  No new focal  pulmonary opacity.  No pneumothorax.  No acute osseous finding.  IMPRESSION: Stable trace bilateral pleural effusions or thickening.  CT is scheduled for later today and will be dictated under separate report.   Original Report Authenticated By: Christiana Pellant, M.D.    MDM   1. Chest pain   2. Edema   3. Tachycardia    The patient has chronic alcoholic liver disease as well as diffuse bilateral lower extremities edema and a history of a prior pulmonary embolism. I discussed with the patient at length the indications for further workup including a CT scan to look at pulmonary embolism. Thankfully he does not have a pulmonary embolism based on the CT scan he does have a mild sinus tachycardia, his EKG shows a nonspecific T waves and a mild tachycardia, no other findings.  His labs otherwise show that he has a stable chronic anemia with a hemoglobin of 7-1/2, renal failure which we know the patient has as he came from dialysis, normal electrolytes. Troponin was negative, discussed with the hospitalist to admit the patient to hospital for further evaluation of his chest pain. The patient has had a GI bleed in the past and thus aspirin is relatively contraindicated at this time.  ED ECG REPORT  I personally interpreted this EKG   Date: 03/26/2013   Rate: 118  Rhythm: sinus tachycardia  QRS Axis: normal  Intervals: normal  ST/T Wave abnormalities: nonspecific T wave changes  Conduction Disutrbances:none  Narrative Interpretation:   Old EKG Reviewed: Compared with 03/18/2013, no significant changes  Discussed care with hospitalist who will admit. CT scan negative for pulmonary embolism  I personally performed the services described in this documentation, which was scribed in my presence. The recorded information has been reviewed and is accurate.    Vida Roller, MD 03/26/13 [phone]

## 2013-03-26 NOTE — Telephone Encounter (Signed)
Alinda Money would like to speak with Dr. Karilyn Cota about his dad, Timothy Chan. Would like to know why he was transferred to [facility] and not [facility]. Dillian is presently back at the [facility] ED and would like to see if Dr. Karilyn Cota would transfer him to [facility] where his liver doctor is. Tony's return phone number is [phone].

## 2013-03-26 NOTE — ED Notes (Signed)
Patient brought in via EMS from dialysis center. Patient originally from [facility]. Patient c/o headache, neck pain, and left chest pain during dialysis. Denies any pain at this time. Per patient has had this intermittently for past 4-6 months. Denies any shortness of breath, dizzines, nausea, or vomiting.

## 2013-03-27 DIAGNOSIS — E876 Hypokalemia: Secondary | ICD-10-CM | POA: Diagnosis present

## 2013-03-27 DIAGNOSIS — E871 Hypo-osmolality and hyponatremia: Secondary | ICD-10-CM | POA: Diagnosis not present

## 2013-03-27 DIAGNOSIS — K922 Gastrointestinal hemorrhage, unspecified: Secondary | ICD-10-CM

## 2013-03-27 LAB — CBC
Hemoglobin: 5.7 g/dL — CL (ref 13.0–17.0)
MCH: 30.2 pg (ref 26.0–34.0)
MCHC: 33.1 g/dL (ref 30.0–36.0)
Platelets: 163 10*3/uL (ref 150–400)
Platelets: 159 10*3/uL (ref 150–400)
RBC: 2.61 MIL/uL — ABNORMAL LOW (ref 4.22–5.81)
RDW: 17.4 % — ABNORMAL HIGH (ref 11.5–15.5)
RDW: 16.3 % — ABNORMAL HIGH (ref 11.5–15.5)
WBC: 7 10*3/uL (ref 4.0–10.5)

## 2013-03-27 LAB — PREPARE RBC (CROSSMATCH)

## 2013-03-27 LAB — BASIC METABOLIC PANEL
BUN: 26 mg/dL — ABNORMAL HIGH (ref 6–23)
Calcium: 8.1 mg/dL — ABNORMAL LOW (ref 8.4–10.5)
Creatinine, Ser: 5.08 mg/dL — ABNORMAL HIGH (ref 0.50–1.35)
GFR calc non Af Amer: 11 mL/min — ABNORMAL LOW (ref >90)
Glucose, Bld: 105 mg/dL — ABNORMAL HIGH (ref 70–99)

## 2013-03-27 LAB — GLUCOSE, CAPILLARY: Glucose-Capillary: 141 mg/dL — ABNORMAL HIGH (ref 70–99)

## 2013-03-27 LAB — TROPONIN I: Troponin I: <0.3 ng/mL (ref <0.30)

## 2013-03-27 MED ORDER — POTASSIUM CHLORIDE CRYS ER 20 MEQ PO TBCR
40.0000 meq | EXTENDED_RELEASE_TABLET | Freq: Once | ORAL | Status: AC
  Administered 2013-03-27: 40 meq via ORAL
  Filled 2013-03-27: qty 2

## 2013-03-27 NOTE — Progress Notes (Signed)
Called report to [place], nurse at [facility] in [place], [place].  Verbalized understanding.  Pt to be d.c to facility via EMS when EMS arrives. [place], Candelaria Stagers 03/27/2013

## 2013-03-27 NOTE — Discharge Summary (Signed)
Physician Discharge Summary  Timothy Chan WUJ:[id number] DOB: 05/27/1951 DOA: 03/26/2013  PCP: Ignatius Specking., MD  Admit date: 03/26/2013 Discharge date: 03/27/2013  Time spent: 40 minutes  Recommendations for Outpatient Follow-up:  1. PCP in 1 week for evaluation of symptoms. Recommend cbc to track Hg. Of note pt reports having appointment with GI at [facility] in 10 days for follow up 2. Dialysis to continue MWF  Discharge Diagnoses:  Principal Problem:   Chest pain Active Problems:   Alcoholic cirrhosis/remote TIPS   Diabetes mellitus type 2, uncontrolled, with complications   CKD (chronic kidney disease) stage V requiring chronic dialysis   Acute blood loss anemia   GI bleed   Thrombocytopenia, unspecified   GAVE (gastric antral vascular ectasia)   Hypokalemia   Hyponatremia   Discharge Condition: stable  Diet recommendation: renal  Filed Weights   03/26/13 1108 03/27/13 0421  Weight: 99.791 kg (220 lb) 113.671 kg (250 lb 9.6 oz)    History of present illness:  Timothy Chan is a 62 y.o. male is a resident of [facility] with a past medical history of alcoholic liver disease status post TI PS, end-stage renal disease on Monday Wednesday Friday hemodialysis who presented to the emergency department on 03/26/13 with the chief complaint of chest pain. He stated that he was about 40 minutes into his dialysis treatment when he developed sharp steady chest pain. He states the location of the pain was left anterior and it radiated to his shoulder and left side of his neck as well as to his back. He described the pain as squeezing-like. He rated the pain an 8/10 at best 5-10 at worst. He stated that the dialysis staff "got scared" and sent him to the hospital. He denied any palpitations shortness of breath nausea vomiting headache visual disturbances syncope or near-syncope. He denied any recent fever chills or sick contacts. He did indicate he has a history of  tachycardia and lower extremity edema. He stated that the pain subsided after he was given a shot of morphine. In the emergency department he was given 324 mg of aspirin and 2 mg morphine. Initial evaluation in the emergency room yielded a hemoglobin of 7.4 sodium 134 potassium 3.4 creatinine 4.5. Chest x-ray yielded stable trace bilateral pleural effusions or thickening. CT angioedema chest yielded no evidence of acute pulmonary thromboembolism. Extensive volume loss in the right lung. Small bilateral loculated pleural effusions. On the right it is not significantly changed. On the left is new. Reticular nodular opacities at the left lung base worrisome for inflammatory process. EKG yielded sinus tachycardia. Unchanged from last month EKG.     Hospital Course:  Chest pain: Atypical. May be related to dialysis treatment.Admitted to telemetry to rule out. Initial EKG reassuring and repeat EKG with no changes. Both non-acute. CE negative x3. Patient pain free in the emergency department. He had no events on telemetry. He had no further pain. He states on day of discharge that he thinks pain may have been related to indigestion as he was "rushed to eat his oatmeal" as he was late for dialysis.    Active Problems:  Alcoholic cirrhosis/remote TIPS: Remained stable at baseline. Continue his lactulose.   Diabetes mellitus type 2, uncontrolled, hemoglobin A1c 4.6 last month. SSI provided during hospitalization. At discharge will resume home regimen   CKD (chronic kidney disease) stage V requiring chronic dialysis: Patient is on Monday Wednesday Friday dialysis. His admission creatinine of 4.5 to is close  to baseline. At discharge creatinine 5.4.  Of note he was not able to complete his dialysis treatment 0n 03/26/13. Pt will resume dialysis schedule tomorrow   Thrombocytopenia, unspecified: Related to #2. Stable   GAVE (gastric antral vascular ectasia). He has a recurrent upper GI bleed secondary to gastric  antral vascular ectasia. Last month he underwent three EGDs with APC ablation. Since June 2014 he has undergone 5 therapeutic EGDs and they provided minimal/transient benefit. He received 9 units of PRBCs during recent hospitalization and he has received 2 units of PRBCs on most recent hospitalization 10 days prior to this admission. He was evaluated again by Dr Karilyn Cota who opined that what he really needs is a para renal transplant but at this point he is not a candidate. He may be bleeding not only from gastric antral vascular ectasia he may have more lesions involving the small intestine. Of note, during last hospitalization, patient's condition discussed with Dr. Harlen Labs of gastroenterology service at [facility] and he was transferred to [facility]. He is to follow up with Dr. Margaretha Glassing in 1 week. He received 2 units of PRBC's this hospitalization.    GI bleed: see above. Hg 7.4 on admission. On day of discharge am labs indicate Hg 5.7. He received 2 units PRBC's. At discharge Hg 7.9. Recommend follow up cbc in 1 wek   Acute blood loss anemia: see above  Procedures:none Consultations:  none  Discharge Exam: Filed Vitals:   03/27/13 1441  BP: 121/50  Pulse: 97  Temp: 98.5 F (36.9 C)  Resp: 18    General: chronically ill appearing NAD Cardiovascular: tachycardic regular no MGR trace LE edema Respiratory: normal effort BS slightly diminished no wheeze. Faint bilateral basilar crackles Abdomen: soft +BS mild tenderness to palpation  Discharge Instructions      Discharge Orders   Future Orders Complete By Expires   Diet - low sodium heart healthy  As directed    Increase activity slowly  As directed        Medication List         ciprofloxacin 500 MG tablet  Commonly known as:  CIPRO  Take 500 mg by mouth once.     ferrous sulfate 325 (65 FE) MG tablet  Take 1 tablet (325 mg total) by mouth 2 (two) times daily with a meal.     folic acid 400 MCG tablet  Commonly known as:   FOLVITE  Take 400 mcg by mouth daily.     lactulose 10 GM/15ML solution  Commonly known as:  CHRONULAC  Take 20 g by mouth daily.     metoCLOPramide 5 MG tablet  Commonly known as:  REGLAN  Take 1 tablet (5 mg total) by mouth 4 (four) times daily -  before meals and at bedtime.     ondansetron 4 MG tablet  Commonly known as:  ZOFRAN  Take 4 mg by mouth daily as needed for nausea.     pantoprazole 40 MG tablet  Commonly known as:  PROTONIX  Take 1 tablet (40 mg total) by mouth 2 (two) times daily before a meal.     sevelamer carbonate 800 MG tablet  Commonly known as:  RENVELA  Take 2 tablets (1,600 mg total) by mouth 3 (three) times daily with meals.     sucralfate 1 GM/10ML suspension  Commonly known as:  CARAFATE  Take 10 mLs (1 g total) by mouth 4 (four) times daily.     traMADol 50 MG tablet  Commonly known as:  ULTRAM  Take 1 tablet (50 mg total) by mouth 3 (three) times daily as needed for pain.     WESTCORT 0.2 %  Generic drug:  hydrocortisone valerate ointment  Apply 1 application topically every Monday, Wednesday, and Friday. For numbing prior to dialysis       Allergies  Allergen Reactions  . Tylenol [Acetaminophen] Other (See Comments)    cirrhosis   Follow-up Information   Follow up with VYAS,DHRUV B., MD. Schedule an appointment as soon as possible for a visit in 1 week. (recommend cbc to track Hg. )    Specialty:  Internal Medicine   Contact information:   [address] [place] [address] [phone]        The results of significant diagnostics from this hospitalization (including imaging, microbiology, ancillary and laboratory) are listed below for reference.    Significant Diagnostic Studies: Dg Chest 1 View  03/09/2013   *RADIOLOGY REPORT*  Clinical Data: Central line placement  CHEST - 1 VIEW  Comparison: 02/26/2013  Findings: Left subclavian central venous catheter tip is within the right jugular vein directed cranially, tip above extent  of chest radiograph. Enlargement of cardiac silhouette with pulmonary vascular congestion. Bronchitic changes with small right pleural effusion and mild basilar atelectasis. Lungs otherwise clear. No pneumothorax. Bones demineralized.  IMPRESSION: No pneumothorax following central line placement. Left subclavian catheter however enters the right jugular vein with the tip directed cranially above the extent of chest radiograph exam; recommend repositioning. COPD changes with right basilar atelectasis and small right pleural effusion.   Original Report Authenticated By: Ulyses Southward, M.D.   Dg Chest 1 View  02/26/2013   *RADIOLOGY REPORT*  Clinical Data: Tachycardia.  CHEST - 1 VIEW  Comparison: Chest 02/23/2013 and 02/16/2013.  Findings: Very small right pleural effusion and mild right basilar atelectasis are seen.  Left lung appears clear.  Heart size is upper normal.  No pneumothorax.  IMPRESSION: Very small right pleural effusion and mild basilar atelectasis.   Original Report Authenticated By: Holley Dexter, M.D.   Dg Knee 1-2 Views Left  03/16/2013   *RADIOLOGY REPORT*  Clinical Data: Left knee pain and swelling.  LEFT KNEE - 1-2 VIEW  Comparison: None.  Findings: There is evidence of moderate osteoarthritis involving medial and lateral joint spaces.  Vascular calcifications are present.  No fracture, dislocation or bony lesion is identified. There may be a small amount of joint fluid in the suprapatellar region.  Soft tissues appear diffusely edematous.  IMPRESSION: Osteoarthritis.  No acute bony findings.  Potential small amount of joint fluid in the suprapatellar region.   Original Report Authenticated By: Irish Lack, M.D.   Ct Angio Chest Pe W/cm &/or Wo Cm  03/26/2013   *RADIOLOGY REPORT*  Clinical Data: Short of breath  CT ANGIOGRAPHY CHEST  Technique:  Multidetector CT imaging of the chest using the standard protocol during bolus administration of intravenous contrast. Multiplanar  reconstructed images including MIPs were obtained and reviewed to evaluate the vascular anatomy.  Contrast: OMNIPAQUE IOHEXOL 350 MG/ML SOLN  Comparison: Abdomen CT 10/27/2012  Findings: There are no filling defects in the pulmonary arterial tree to suggest acute pulmonary thromboembolism.  Atherosclerotic changes of the aortic arch and descending aorta are noted.  Mild peripheral calcifications of the aortic valve.  Three- vessel advance coronary artery calcifications are present.  There is calcified plaque in the proximal left subclavian artery without obvious significant narrowing.  Left common carotid artery is patent.  Innominate arteries patent.  Right common carotid artery and subclavian artery grossly patent.  Bilateral vertebral arteries are also grossly patent.  13 mm right paratracheal lymph node on image 30.  Other smaller mediastinal nodes are scattered.  11 mm right paratracheal node on image 14 at the thoracic inlet.  There is a small loculated pleural effusion at the right lung base and is not significantly changed.  An adjacent mass-like area of round consolidation in the right lower lobe measuring 4.6 cm is not significantly changed.  There is extensive volume loss in the right lower lung.  Linear opacities extend from this round area to the hilum and this has been previously characterized and round atelectasis.  A small left pleural effusion primarily posteriorly is slightly increased in size.  It is somewhat irregular and also appears loculated.  Reticulonodular opacities are present at the left lung base have increased and are most consistent with an inflammatory process. Primarily linear opacities are present at the right lung base compatible with volume loss.  Minimal T4 and T5 superior end plate depressions at a chronic appearance.  No definite acute bony deformity.  There are calcifications at the dome of the right hemidiaphragm adjacent to a partially calcified liver lesion.  This is  not significantly changed.  A TIPS device is in place.  Diffuse subcutaneous fat edema. Small amount of fluid density is present in the abdomen anterior to the spleen.  IMPRESSION: No evidence of acute pulmonary thromboembolism.  There is extensive volume loss in the right lung.  Round atelectasis is present at the right lung base.  Small bilateral loculated pleural effusions.  On the right, it is not significantly changed.  On the left it is new.  Reticulonodular opacities at the left lung base worrisome for inflammatory process.  Mild mediastinal adenopathy.  Differential diagnosis includes both inflammatory and neoplastic etiology.  Correlate clinically as for the need for follow-up chest CT to ensure resolution.  Stable findings in the abdomen.   Original Report Authenticated By: Jolaine Click, M.D.   Nm Pulmonary Perf And Vent  02/26/2013   *RADIOLOGY REPORT*  Clinical Data:  62 year old male with shortness of breath and coughing  NUCLEAR MEDICINE VENTILATION - PERFUSION LUNG SCAN  Technique:  Ventilation images were obtained in multiple projections using inhaled aerosol technetium 99 M DTPA.  Perfusion images were obtained in multiple projections after intravenous injection of Tc-48m MAA.  Radiopharmaceuticals:  Tc-88m DTPA aerosol and 6.0 mCi Tc-23m MAA.  Comparison: 02/26/2013 chest radiograph  Findings:  Ventilation:  Heterogeneous ventilation is identified.  Perfusion:  Scattered nonsegmental heterogeneous areas of decreased perfusion are identified, but match ventilation defects.  IMPRESSION: Low probability for pulmonary embolus (less than 20%).   Original Report Authenticated By: Harmon Pier, M.D.   US Paracentesis  03/09/2013   *RADIOLOGY REPORT*  ULTRASOUND GUIDED PARACENTESIS:  Clinical Data:  Cirrhosis, ascites  Technique: After explanation of procedure, benefits, and risks, written informed consent was obtained. Time-out protocol was followed. Collection of ascites in left lower quadrant  localized by ultrasound. Skin prepped and draped in usual sterile fashion. Skin and soft tissues anesthestized with 9 ml of 1% lidocaine. 5-French Yueh catheter placed into peritoneal cavity. 1080 ml of yellow fluid aspirated by vacuum bottle suction. Procedure tolerated well by patient without immediate complication.  IMPRESSION: Ultrasound-guided paracentesis of 1080 ml of ascitic fluid. Fluid sent to laboratory for requested analysis.   Original Report Authenticated By: Ulyses Southward, M.D.   US Paracentesis  03/07/2013   *  RADIOLOGY REPORT*  Clinical Data: Alcoholic cirrhosis, ascites  LIMITED ABDOMEN ULTRASOUND FOR ASCITES  Technique:  Limited ultrasound survey for ascites was performed in all four abdominal quadrants.  Comparison:  02/26/2013  Findings: Survey of the abdomen for ascites demonstrates no significant ascites in the right upper quadrant Minimal ascites in the right lower quadrant. Larger pockets of ascites are seen in the left lower quadrant and to a lesser degree in left upper quadrant, though substantially less than was present at the time of the prior exam.  IMPRESSION: Less ascites is present on the current study than was seen previously. At the time of previous imaging, only 2 liters of ascites could be aspirated and the current ascites volume appears to be much less. Discussed with Dr. Kerry Hough; will hold off on paracentesis today and reassess in several days in anticipation of performing paracentesis prior to discharge.   Original Report Authenticated By: Ulyses Southward, M.D.   US Paracentesis  02/26/2013   *RADIOLOGY REPORT*  ULTRASOUND GUIDED PARACENTESIS:  Clinical Data:  Cirrhosis, tense ascites  Technique: After explanation of procedure, benefits, and risks, written informed consent was obtained. Time-out protocol was followed. Collection of ascites in left lower quadrant localized by ultrasound. Skin prepped and draped in usual sterile fashion. Skin and soft tissues anesthestized with 7 ml  of 1% lidocaine. 5-French Yueh catheter placed into peritoneal cavity. 2000 ml of yellow colored fluid aspirated by vacuum bottle suction. Procedure tolerated well by patient without immediate complication.  IMPRESSION: Ultrasound-guided paracentesis of 2000 ml of ascitic fluid. Fluid sent to laboratory for requested analysis.   Original Report Authenticated By: Ulyses Southward, M.D.   Dg Chest Port 1 View  03/26/2013   *RADIOLOGY REPORT*  Clinical Data: Chest pain and shortness of breath  PORTABLE CHEST - 1 VIEW  Comparison: 03/18/2013  Findings: Chronically prominent interstitial markings noted without focal opacity or evidence for edema.  Trace pleural effusions or thickening again noted.  No new focal pulmonary opacity.  No pneumothorax.  No acute osseous finding.  IMPRESSION: Stable trace bilateral pleural effusions or thickening.  CT is scheduled for later today and will be dictated under separate report.   Original Report Authenticated By: Christiana Pellant, M.D.   Dg Chest Port 1 View  03/18/2013   CLINICAL DATA:  Abdominal pain. Shortness of breath.  EXAM: PORTABLE CHEST - 1 VIEW  COMPARISON:  03/09/2013  FINDINGS: Emphysema noted with stable right pleural thickening. Indistinct opacity medially at the right lung base. Heart size within normal limits for technique. Atherosclerotic aortic arch noted. Mild interstitial accentuation noted bilaterally, likely chronic.  IMPRESSION: 1. Suspected small right pleural effusion. 2. Emphysema. 3. Ill-defined density at the right medial lung base. This has been previously characterized as rounded atelectasis on prior CT scans.   Electronically Signed   By: Herbie Baltimore   On: 03/18/2013 20:13    Microbiology: Recent Results (from the past 240 hour(s))  MRSA PCR SCREENING     Status: Abnormal   Collection Time    03/19/13  3:00 AM      Result Value Range Status   MRSA by PCR POSITIVE (*) NEGATIVE Final   Comment:            The GeneXpert MRSA Assay (FDA      approved for NASAL specimens     only), is one component of a     comprehensive MRSA colonization     surveillance program. It is not     intended  to diagnose MRSA     infection nor to guide or     monitor treatment for     MRSA infections.     RESULT CALLED TO, READ BACK BY AND VERIFIED WITH:     NEILSON,T @0530  ON 03/19/13 BY WOODIE,J     Labs: Basic Metabolic Panel:  Recent Labs Lab 03/26/13 1203 03/27/13 0607  NA 134* 131*  K 3.4* 3.3*  CL 97 99  CO2 28 28  GLUCOSE 160* 105*  BUN 22 26*  CREATININE 4.52* 5.08*  CALCIUM 8.3* 8.1*   Liver Function Tests:  Recent Labs Lab 03/26/13 1203  AST 43*  ALT 14  ALKPHOS 169*  BILITOT 1.2  PROT 6.0  ALBUMIN 1.6*   No results found for this basename: LIPASE, AMYLASE,  in the last 168 hours No results found for this basename: AMMONIA,  in the last 168 hours CBC:  Recent Labs Lab 03/26/13 1203 03/27/13 0607 03/27/13 1526  WBC 9.6 6.2 7.0  NEUTROABS 6.9  --   --   HGB 7.4* 5.7* 7.9*  HCT 22.5* 17.2* 23.2*  MCV 91.8 91.0 88.9  PLT 188 163 159   Cardiac Enzymes:  Recent Labs Lab 03/26/13 1203 03/26/13 2027 03/27/13 0607  TROPONINI <0.30 <0.30 <0.30   BNP: BNP (last 3 results)  Recent Labs  11/21/12 1145  PROBNP 1167.0*   CBG:  Recent Labs Lab 03/26/13 1800 03/26/13 2016 03/27/13 0720 03/27/13 1120  GLUCAP 151* 143* 91 141*       Signed:  Emmilynn Marut M  [facility] 03/27/2013, 3:53 PM

## 2013-03-27 NOTE — Clinical Social Work Psychosocial (Signed)
Clinical Social Work Department BRIEF PSYCHOSOCIAL ASSESSMENT 03/27/2013  Patient:  Timothy Chan, Timothy Chan     Account Number:  [id number]     Admit date:  03/26/2013  Clinical Social Worker:  Nancie Neas  Date/Time:  03/27/2013 10:10 AM  Referred by:  CSW  Date Referred:  03/27/2013 Referred for  SNF Placement   Other Referral:   Interview type:  Patient Other interview type:    PSYCHOSOCIAL DATA Living Status:  FACILITY Admitted from facility:  [facility] Level of care:  Skilled Nursing Facility Primary support name:  Alinda Money Primary support relationship to patient:  CHILD, ADULT Degree of support available:   supportive per pt    CURRENT CONCERNS Current Concerns  Post-Acute Placement   Other Concerns:    SOCIAL WORK ASSESSMENT / PLAN CSW met with pt at bedside. Pt very well known to CSW from previous admissions. He has been a resident at [facility] for about a month. Pt has been hospitalized several times during this period. He was admitted yesterday for chest pain. Pt is getting two units of blood today and is hoping to d/c after that. Per Tresa Endo at facility, pt is okay to return and agreeable to no FL2 if <24 hour observation. Pt is on dialysis. Pt states his brother should be able to take him back to [facility] today.   Assessment/plan status:  Psychosocial Support/Ongoing Assessment of Needs Other assessment/ plan:   Information/referral to community resources:    PATIENT'S/FAMILY'S RESPONSE TO PLAN OF CARE: Pt agreeable to return to [facility]. CSW will send d/c summary when complete.       Derenda Fennel, [place] [phone]

## 2013-03-27 NOTE — Discharge Summary (Signed)
Seen and examined. Serial troponin and EKG have been negative. Chest pain likely atypical , possibly musculoskeletal vs related to SOB and does not have further symptoms Has underlying severe alcoholic cirrhotic disease and recurrent GI bleed. Hemoglobin of 5.7 this morning and given 2 units PRBC with improvement in his hemoglobin. Patient is clinically stable to be discharged with outpatient followup and dialysis.

## 2013-03-27 NOTE — Progress Notes (Cosign Needed)
[facility] PROGRESS NOTE  Timothy Chan WNU:[id number] DOB: 30-Aug-1950 DOA: 03/26/2013 PCP: Ignatius Specking., MD  Assessment/Plan: Chest pain: Atypical. May be related to dialysis treatment. No further episodes of chest pain. Repeat EKG non-acute.Troponin negative x3.   Active Problems:  Alcoholic cirrhosis/remote TIPS: Appears stable at baseline. Will continue his lactulose.   Diabetes mellitus type 2, uncontrolled, hemoglobin A1c 4.6 last month. CBG range 91-143. Will continue SSI   CKD (chronic kidney disease) stage V requiring chronic dialysis: Patient is on Monday Wednesday Friday dialysis. Creatinine tending up. Of note he was not able to complete his dialysis treatment 03/26/13. Patient will likely be here another day due to anemia. Will request renal consult for dialysis tomorrow.    Thrombocytopenia, unspecified: Related to #2. Currently his platelets are 163.  Will monitor closely   GAVE (gastric antral vascular ectasia). 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 Last month he underwent three EGDs with APC ablation. Since June 2014 he has undergone 5 therapeutic EGDs and they provided minimal/transient benefit. He received 9 units of PRBCs during recent hospitalization and he has received 2 units of PRBCs on most recent hospitalization 10 days ago. He was evaluated again by Dr Karilyn Cota who opined that what he really needs is a para renal transplant but at this point he is not a candidate. He may be bleeding not only from gastric antral vascular ectasia he may have more lesions involving the small intestine. Of note, during last hospitalization, patient's condition discussed with Dr. Harlen Labs of gastroenterology service at [facility] and he was transferred to [facility]. He was discharged from there 2 days ago.   GI bleed: see above. Hemoglobin trending down. Will transfuse 2 untis PRBC's. Monitor.    Acute blood loss anemia: History of same given the  above. Transfuse today   Hypokalemia. Will replete and recheck.   Hyponatremia: mild. Will recheck in am.   Code Status: full Family Communication: none available Disposition Plan: [facility] hopefully today after transfusion   Consultants:  renal  Procedures:  none  Antibiotics:  none  HPI/Subjective: Sitting up eating breakfast. Denies any pain or any further episodes of chest pain.   Objective: Filed Vitals:   03/27/13 0421  BP: 93/49  Pulse: 102  Temp: 98.4 F (36.9 C)  Resp: 20    Intake/Output Summary (Last 24 hours) at 03/27/13 0851 Last data filed at 03/27/13 0553  Gross per 24 hour  Intake    600 ml  Output      0 ml  Net    600 ml   Filed Weights   03/26/13 1108 03/27/13 0421  Weight: 99.791 kg (220 lb) 113.671 kg (250 lb 9.6 oz)    Exam:   General:  Somewhat pale  chronically ill appearing NAD  Cardiovascular: Tachycardic but regular no MGR 1+LE edema  Respiratory: normal effort BS slightly diminished with faint crackles bases. No wheeze  Abdomen: only slightly distended, soft +BS mild tenderness to palpation particularly upper quadrants  Musculoskeletal: no clubbing no cyanosis   Data Reviewed: Basic Metabolic Panel:  Recent Labs Lab 03/26/13 1203 03/27/13 0607  NA 134* 131*  K 3.4* 3.3*  CL 97 99  CO2 28 28  GLUCOSE 160* 105*  BUN 22 26*  CREATININE 4.52* 5.08*  CALCIUM 8.3* 8.1*   Liver Function Tests:  Recent Labs Lab 03/26/13 1203  AST 43*  ALT 14  ALKPHOS 169*  BILITOT 1.2  PROT 6.0  ALBUMIN 1.6*   No results found for this basename: LIPASE, AMYLASE,  in the last 168 hours No results found for this basename: AMMONIA,  in the last 168 hours CBC:  Recent Labs Lab 03/26/13 1203 03/27/13 0607  WBC 9.6 6.2  NEUTROABS 6.9  --   HGB 7.4* 5.7*  HCT 22.5* 17.2*  MCV 91.8 91.0  PLT 188 163   Cardiac Enzymes:  Recent Labs Lab 03/26/13 1203 03/26/13 2027 03/27/13 0607  TROPONINI <0.30 <0.30 <0.30    BNP (last 3 results)  Recent Labs  11/21/12 1145  PROBNP 1167.0*   CBG:  Recent Labs Lab 03/26/13 1800 03/26/13 2016 03/27/13 0720  GLUCAP 151* 143* 91    Recent Results (from the past 240 hour(s))  MRSA PCR SCREENING     Status: Abnormal   Collection Time    03/19/13  3:00 AM      Result Value Range Status   MRSA by PCR POSITIVE (*) NEGATIVE Final   Comment:            The GeneXpert MRSA Assay (FDA     approved for NASAL specimens     only), is one component of a     comprehensive MRSA colonization     surveillance program. It is not     intended to diagnose MRSA     infection nor to guide or     monitor treatment for     MRSA infections.     RESULT CALLED TO, READ BACK BY AND VERIFIED WITH:     NEILSON,T @0530  ON 03/19/13 BY WOODIE,J     Studies: Ct Angio Chest Pe W/cm &/or Wo Cm  03/26/2013   *RADIOLOGY REPORT*  Clinical Data: Short of breath  CT ANGIOGRAPHY CHEST  Technique:  Multidetector CT imaging of the chest using the standard protocol during bolus administration of intravenous contrast. Multiplanar reconstructed images including MIPs were obtained and reviewed to evaluate the vascular anatomy.  Contrast: OMNIPAQUE IOHEXOL 350 MG/ML SOLN  Comparison: Abdomen CT 10/27/2012  Findings: There are no filling defects in the pulmonary arterial tree to suggest acute pulmonary thromboembolism.  Atherosclerotic changes of the aortic arch and descending aorta are noted.  Mild peripheral calcifications of the aortic valve.  Three- vessel advance coronary artery calcifications are present.  There is calcified plaque in the proximal left subclavian artery without obvious significant narrowing.  Left common carotid artery is patent.  Innominate arteries patent.  Right common carotid artery and subclavian artery grossly patent.  Bilateral vertebral arteries are also grossly patent.  13 mm right paratracheal lymph node on image 30.  Other smaller mediastinal nodes are  scattered.  11 mm right paratracheal node on image 14 at the thoracic inlet.  There is a small loculated pleural effusion at the right lung base and is not significantly changed.  An adjacent mass-like area of round consolidation in the right lower lobe measuring 4.6 cm is not significantly changed.  There is extensive volume loss in the right lower lung.  Linear opacities extend from this round area to the hilum and this has been previously characterized and round atelectasis.  A small left pleural effusion primarily posteriorly is slightly increased in size.  It is somewhat irregular and also appears loculated.  Reticulonodular opacities are present at the left lung base have increased and are most consistent with an inflammatory process. Primarily linear opacities are present at the right lung base compatible with volume loss.  Minimal T4 and T5  superior end plate depressions at a chronic appearance.  No definite acute bony deformity.  There are calcifications at the dome of the right hemidiaphragm adjacent to a partially calcified liver lesion.  This is not significantly changed.  A TIPS device is in place.  Diffuse subcutaneous fat edema. Small amount of fluid density is present in the abdomen anterior to the spleen.  IMPRESSION: No evidence of acute pulmonary thromboembolism.  There is extensive volume loss in the right lung.  Round atelectasis is present at the right lung base.  Small bilateral loculated pleural effusions.  On the right, it is not significantly changed.  On the left it is new.  Reticulonodular opacities at the left lung base worrisome for inflammatory process.  Mild mediastinal adenopathy.  Differential diagnosis includes both inflammatory and neoplastic etiology.  Correlate clinically as for the need for follow-up chest CT to ensure resolution.  Stable findings in the abdomen.   Original Report Authenticated By: Jolaine Click, M.D.   Dg Chest Port 1 View  03/26/2013   *RADIOLOGY REPORT*   Clinical Data: Chest pain and shortness of breath  PORTABLE CHEST - 1 VIEW  Comparison: 03/18/2013  Findings: Chronically prominent interstitial markings noted without focal opacity or evidence for edema.  Trace pleural effusions or thickening again noted.  No new focal pulmonary opacity.  No pneumothorax.  No acute osseous finding.  IMPRESSION: Stable trace bilateral pleural effusions or thickening.  CT is scheduled for later today and will be dictated under separate report.   Original Report Authenticated By: Christiana Pellant, M.D.    Scheduled Meds: . aspirin EC  81 mg Oral Daily  . Chlorhexidine Gluconate Cloth  6 each Topical Q0600  . ciprofloxacin  500 mg Oral Q breakfast  . ferrous sulfate  325 mg Oral BID WC  . folic acid  0.5 mg Oral Daily  . insulin aspart  0-15 Units Subcutaneous TID WC  . lactulose  20 g Oral Daily  . metoCLOPramide  5 mg Oral TID AC & HS  . mupirocin ointment  1 application Nasal BID  . pantoprazole  40 mg Oral BID AC  . sevelamer carbonate  1,600 mg Oral TID WC  . sodium chloride  3 mL Intravenous Q12H  . sodium chloride  3 mL Intravenous Q12H  . sucralfate  1 g Oral QID   Continuous Infusions:   Principal Problem:   Chest pain Active Problems:   Alcoholic cirrhosis/remote TIPS   Diabetes mellitus type 2, uncontrolled, with complications   CKD (chronic kidney disease) stage V requiring chronic dialysis   Acute blood loss anemia   GI bleed   Thrombocytopenia, unspecified   GAVE (gastric antral vascular ectasia)    Time spent: 30 minutes    [facility] M  [facility] Pager [phone]. If 7PM-7AM, please contact night-coverage at [email or web address], password [facility] 03/27/2013, 8:51 AM  LOS: 1 day

## 2013-03-27 NOTE — Progress Notes (Signed)
Utilization Review Complete  

## 2013-03-27 NOTE — Clinical Social Work Note (Signed)
Pt d/c back to [facility] today. CSW will fax d/c summary and arrange transport via [facility].  Derenda Fennel, [place] [phone]

## 2013-03-28 ENCOUNTER — Other Ambulatory Visit: Payer: Self-pay | Admitting: *Deleted

## 2013-03-28 LAB — TYPE AND SCREEN
Antibody Screen: NEGATIVE
Unit division: 0

## 2013-03-28 MED ORDER — TRAMADOL HCL 50 MG PO TABS: 50.0000 mg | ORAL_TABLET | Freq: Three times a day (TID) | ORAL | Status: DC | PRN

## 2013-03-30 NOTE — Progress Notes (Signed)
UR Chart Review Completed  

## 2013-04-03 NOTE — Discharge Summary (Signed)
Physician Discharge Summary  Timothy Chan JYN:[id number] DOB: December 16, 1950 DOA: 03/18/2013  PCP: Ignatius Specking., MD  Admit date: 03/18/2013. Discharge date: 03/19/2013.  to a cause Time spent: 30 minutes. minutes   1. Patient is being transferred to [facility] for further management.   Discharge Diagnoses:  1. Upper GI bleed, persistent and recurring related to gastric antral vascular ectasia. 2. Acute blood loss anemia status post 2 units blood transfusion. 3. Alcoholic cirrhosis with a history of remote TIPS procedure. 4. Type 2 diabetes mellitus. 5. End stage renal disease on hemodialysis.   Discharge Condition: Stable.  Diet recommendation: N.p.o. when patient was transferred to [facility].  [facility] Weights   03/18/13 1922 03/18/13 2222  Weight: 101.606 kg (224 lb) 103.2 kg (227 lb 8.2 oz)    History of present illness:  This 62 year old man, who is well-known to the hospital, presented once again with GI bleed in the way of melena. Please see initial history as outlined below: HPI: Timothy Chan is a 62 y.o. male who is well-known to the hospital, having had 2 admissions so far this month alone, this being his third admission. He was previously admitted 10 days ago for black tarry stools and anemia requiring blood transfusion. He comes again now with melena, he was found to have a hemoglobin of 6.9. He also has had abdominal pain and discomfort and missed dialysis yesterday. He has end-stage renal disease and is on hemodialysis 3 times a week. On his previous admission, no EGD was done as he had recently had one. The patient has a history of alcoholic liver disease with esophageal varices and recurrent ascites. On the last admission the patient had paracentesis of 2 L or so. The patient has had a TIPS procedure previously and the plan was to follow with Dr. Karilyn Cota, gastroenterology to see if he would benefit from another one. He lives at a skilled nursing facility and he was evaluated by  Dr. Leanord Hawking, physician at the nursing home today. He has become weaker again. He has had frequent falls and is severely deconditioned. He is now being admitted for further management.  Hospital Course:  The patient was hospitalized and given 2 units of blood. He was supported hemodynamically. He was seen by Dr. Karilyn Cota gastroenterology who felt that further endoscopies would be of not much value at this point in that transfer to a tertiary center would be recommended. Fortunately, he was able to get transfer very quickly and he was transferred the following day after admission, remained hemodynamically stable. He will have further treatment there.  Procedures:  None.   Consultations:  Gastroenterology, Dr Karilyn Cota.  Discharge Exam: Filed Vitals:   03/19/13 1156  BP: 96/56  Pulse: 110  Temp: 98.5 F (36.9 C)  Resp: 20    General: He looks chronically sick but not acutely unwell at the present time. His hemodynamics are stable. He looks somewhat pale. Cardiovascular: Heart sounds are present without murmurs or added sounds. Respiratory: Lung fields are clear. He is alert and orientated. There is no evidence of hepatic encephalopathy at this time.  Discharge Instructions     Medication List    STOP taking these medications       traMADol 50 MG tablet  Commonly known as:  ULTRAM      ASK your doctor about these medications       ciprofloxacin 500 MG tablet  Commonly known as:  CIPRO  Take 1 tablet (500 mg total) by mouth daily.  ferrous sulfate 325 (65 FE) MG tablet  Take 1 tablet (325 mg total) by mouth 2 (two) times daily with a meal.     lactulose 10 GM/15ML solution  Commonly known as:  CHRONULAC  Take 20 g by mouth daily.     metoCLOPramide 5 MG tablet  Commonly known as:  REGLAN  Take 1 tablet (5 mg total) by mouth 4 (four) times daily -  before meals and at bedtime.     ondansetron 4 MG tablet  Commonly known as:  ZOFRAN  Take 4 mg by mouth daily as needed  for nausea.     pantoprazole 40 MG tablet  Commonly known as:  PROTONIX  Take 1 tablet (40 mg total) by mouth 2 (two) times daily before a meal.     sevelamer carbonate 800 MG tablet  Commonly known as:  RENVELA  Take 2 tablets (1,600 mg total) by mouth 3 (three) times daily with meals.     sucralfate 1 GM/10ML suspension  Commonly known as:  CARAFATE  Take 10 mLs (1 g total) by mouth 4 (four) times daily.     WESTCORT 0.2 %  Generic drug:  hydrocortisone valerate ointment  Apply 1 application topically every Monday, Wednesday, and Friday. For numbing prior to dialysis       Allergies  Allergen Reactions  . Tylenol [Acetaminophen] Other (See Comments)    cirrhosis      The results of significant diagnostics from this hospitalization (including imaging, microbiology, ancillary and laboratory) are listed below for reference.    Significant Diagnostic Studies: Dg Chest 1 View  03/09/2013   *RADIOLOGY REPORT*  Clinical Data: Central line placement  CHEST - 1 VIEW  Comparison: 02/26/2013  Findings: Left subclavian central venous catheter tip is within the right jugular vein directed cranially, tip above extent of chest radiograph. Enlargement of cardiac silhouette with pulmonary vascular congestion. Bronchitic changes with small right pleural effusion and mild basilar atelectasis. Lungs otherwise clear. No pneumothorax. Bones demineralized.  IMPRESSION: No pneumothorax following central line placement. Left subclavian catheter however enters the right jugular vein with the tip directed cranially above the extent of chest radiograph exam; recommend repositioning. COPD changes with right basilar atelectasis and small right pleural effusion.   Original Report Authenticated By: Ulyses Southward, M.D.   Dg Knee 1-2 Views Left  03/16/2013   *RADIOLOGY REPORT*  Clinical Data: Left knee pain and swelling.  LEFT KNEE - 1-2 VIEW  Comparison: None.  Findings: There is evidence of moderate osteoarthritis  involving medial and lateral joint spaces.  Vascular calcifications are present.  No fracture, dislocation or bony lesion is identified. There may be a small amount of joint fluid in the suprapatellar region.  Soft tissues appear diffusely edematous.  IMPRESSION: Osteoarthritis.  No acute bony findings.  Potential small amount of joint fluid in the suprapatellar region.   Original Report Authenticated By: Irish Lack, M.D.   Ct Angio Chest Pe W/cm &/or Wo Cm  03/26/2013   *RADIOLOGY REPORT*  Clinical Data: Short of breath  CT ANGIOGRAPHY CHEST  Technique:  Multidetector CT imaging of the chest using the standard protocol during bolus administration of intravenous contrast. Multiplanar reconstructed images including MIPs were obtained and reviewed to evaluate the vascular anatomy.  Contrast: OMNIPAQUE IOHEXOL 350 MG/ML SOLN  Comparison: Abdomen CT 10/27/2012  Findings: There are no filling defects in the pulmonary arterial tree to suggest acute pulmonary thromboembolism.  Atherosclerotic changes of the aortic arch and descending aorta are  noted.  Mild peripheral calcifications of the aortic valve.  Three- vessel advance coronary artery calcifications are present.  There is calcified plaque in the proximal left subclavian artery without obvious significant narrowing.  Left common carotid artery is patent.  Innominate arteries patent.  Right common carotid artery and subclavian artery grossly patent.  Bilateral vertebral arteries are also grossly patent.  13 mm right paratracheal lymph node on image 30.  Other smaller mediastinal nodes are scattered.  11 mm right paratracheal node on image 14 at the thoracic inlet.  There is a small loculated pleural effusion at the right lung base and is not significantly changed.  An adjacent mass-like area of round consolidation in the right lower lobe measuring 4.6 cm is not significantly changed.  There is extensive volume loss in the right lower lung.  Linear  opacities extend from this round area to the hilum and this has been previously characterized and round atelectasis.  A small left pleural effusion primarily posteriorly is slightly increased in size.  It is somewhat irregular and also appears loculated.  Reticulonodular opacities are present at the left lung base have increased and are most consistent with an inflammatory process. Primarily linear opacities are present at the right lung base compatible with volume loss.  Minimal T4 and T5 superior end plate depressions at a chronic appearance.  No definite acute bony deformity.  There are calcifications at the dome of the right hemidiaphragm adjacent to a partially calcified liver lesion.  This is not significantly changed.  A TIPS device is in place.  Diffuse subcutaneous fat edema. Small amount of fluid density is present in the abdomen anterior to the spleen.  IMPRESSION: No evidence of acute pulmonary thromboembolism.  There is extensive volume loss in the right lung.  Round atelectasis is present at the right lung base.  Small bilateral loculated pleural effusions.  On the right, it is not significantly changed.  On the left it is new.  Reticulonodular opacities at the left lung base worrisome for inflammatory process.  Mild mediastinal adenopathy.  Differential diagnosis includes both inflammatory and neoplastic etiology.  Correlate clinically as for the need for follow-up chest CT to ensure resolution.  Stable findings in the abdomen.   Original Report Authenticated By: Jolaine Click, M.D.   US Paracentesis  03/09/2013   *RADIOLOGY REPORT*  ULTRASOUND GUIDED PARACENTESIS:  Clinical Data:  Cirrhosis, ascites  Technique: After explanation of procedure, benefits, and risks, written informed consent was obtained. Time-out protocol was followed. Collection of ascites in left lower quadrant localized by ultrasound. Skin prepped and draped in usual sterile fashion. Skin and soft tissues anesthestized with 9 ml of  1% lidocaine. 5-French Yueh catheter placed into peritoneal cavity. 1080 ml of yellow fluid aspirated by vacuum bottle suction. Procedure tolerated well by patient without immediate complication.  IMPRESSION: Ultrasound-guided paracentesis of 1080 ml of ascitic fluid. Fluid sent to laboratory for requested analysis.   Original Report Authenticated By: Ulyses Southward, M.D.   US Paracentesis  03/07/2013   *RADIOLOGY REPORT*  Clinical Data: Alcoholic cirrhosis, ascites  LIMITED ABDOMEN ULTRASOUND FOR ASCITES  Technique:  Limited ultrasound survey for ascites was performed in all four abdominal quadrants.  Comparison:  02/26/2013  Findings: Survey of the abdomen for ascites demonstrates no significant ascites in the right upper quadrant Minimal ascites in the right lower quadrant. Larger pockets of ascites are seen in the left lower quadrant and to a lesser degree in left upper quadrant, though substantially less than was  present at the time of the prior exam.  IMPRESSION: Less ascites is present on the current study than was seen previously. At the time of previous imaging, only 2 liters of ascites could be aspirated and the current ascites volume appears to be much less. Discussed with Dr. Kerry Hough; will hold off on paracentesis today and reassess in several days in anticipation of performing paracentesis prior to discharge.   Original Report Authenticated By: Ulyses Southward, M.D.   Dg Chest Port 1 View  03/26/2013   *RADIOLOGY REPORT*  Clinical Data: Chest pain and shortness of breath  PORTABLE CHEST - 1 VIEW  Comparison: 03/18/2013  Findings: Chronically prominent interstitial markings noted without focal opacity or evidence for edema.  Trace pleural effusions or thickening again noted.  No new focal pulmonary opacity.  No pneumothorax.  No acute osseous finding.  IMPRESSION: Stable trace bilateral pleural effusions or thickening.  CT is scheduled for later today and will be dictated under separate report.   Original  Report Authenticated By: Christiana Pellant, M.D.   Dg Chest Port 1 View  03/18/2013   CLINICAL DATA:  Abdominal pain. Shortness of breath.  EXAM: PORTABLE CHEST - 1 VIEW  COMPARISON:  03/09/2013  FINDINGS: Emphysema noted with stable right pleural thickening. Indistinct opacity medially at the right lung base. Heart size within normal limits for technique. Atherosclerotic aortic arch noted. Mild interstitial accentuation noted bilaterally, likely chronic.  IMPRESSION: 1. Suspected small right pleural effusion. 2. Emphysema. 3. Ill-defined density at the right medial lung base. This has been previously characterized as rounded atelectasis on prior CT scans.   Electronically Signed   By: Herbie Baltimore   On: 03/18/2013 20:13        BNP: BNP (last 3 results)  Recent Labs  11/21/12 1145  PROBNP 1167.0*   CBG:      Signed:  Mykel Sponaugle C  [facility] 04/03/2013, 8:05 PM

## 2013-04-04 ENCOUNTER — Emergency Department (HOSPITAL_COMMUNITY): Payer: Medicare Other

## 2013-04-04 ENCOUNTER — Inpatient Hospital Stay (HOSPITAL_COMMUNITY)
Admission: EM | Admit: 2013-04-04 | Discharge: 2013-04-16 | DRG: 871 | Disposition: A | Discharge status: Skilled Nursing Facility | Payer: Medicare Other | Attending: Internal Medicine | Admitting: Internal Medicine

## 2013-04-04 ENCOUNTER — Inpatient Hospital Stay (HOSPITAL_COMMUNITY): Payer: Medicare Other

## 2013-04-04 ENCOUNTER — Encounter (HOSPITAL_COMMUNITY): Payer: Self-pay

## 2013-04-04 DIAGNOSIS — Z8349 Family history of other endocrine, nutritional and metabolic diseases: Secondary | ICD-10-CM

## 2013-04-04 DIAGNOSIS — L89109 Pressure ulcer of unspecified part of back, unspecified stage: Secondary | ICD-10-CM | POA: Diagnosis present

## 2013-04-04 DIAGNOSIS — K31819 Angiodysplasia of stomach and duodenum without bleeding: Secondary | ICD-10-CM

## 2013-04-04 DIAGNOSIS — E1165 Type 2 diabetes mellitus with hyperglycemia: Secondary | ICD-10-CM

## 2013-04-04 DIAGNOSIS — R4182 Altered mental status, unspecified: Secondary | ICD-10-CM

## 2013-04-04 DIAGNOSIS — E440 Moderate protein-calorie malnutrition: Secondary | ICD-10-CM

## 2013-04-04 DIAGNOSIS — K7290 Hepatic failure, unspecified without coma: Secondary | ICD-10-CM

## 2013-04-04 DIAGNOSIS — K922 Gastrointestinal hemorrhage, unspecified: Secondary | ICD-10-CM

## 2013-04-04 DIAGNOSIS — R079 Chest pain, unspecified: Secondary | ICD-10-CM

## 2013-04-04 DIAGNOSIS — L89152 Pressure ulcer of sacral region, stage 2: Secondary | ICD-10-CM

## 2013-04-04 DIAGNOSIS — K5900 Constipation, unspecified: Secondary | ICD-10-CM | POA: Diagnosis present

## 2013-04-04 DIAGNOSIS — K7460 Unspecified cirrhosis of liver: Secondary | ICD-10-CM

## 2013-04-04 DIAGNOSIS — K7682 Hepatic encephalopathy: Secondary | ICD-10-CM | POA: Diagnosis present

## 2013-04-04 DIAGNOSIS — D696 Thrombocytopenia, unspecified: Secondary | ICD-10-CM | POA: Diagnosis present

## 2013-04-04 DIAGNOSIS — R630 Anorexia: Secondary | ICD-10-CM

## 2013-04-04 DIAGNOSIS — Z6830 Body mass index (BMI) 30.0-30.9, adult: Secondary | ICD-10-CM

## 2013-04-04 DIAGNOSIS — E871 Hypo-osmolality and hyponatremia: Secondary | ICD-10-CM

## 2013-04-04 DIAGNOSIS — A4159 Other Gram-negative sepsis: Principal | ICD-10-CM | POA: Diagnosis present

## 2013-04-04 DIAGNOSIS — N186 End stage renal disease: Secondary | ICD-10-CM

## 2013-04-04 DIAGNOSIS — H3321 Serous retinal detachment, right eye: Secondary | ICD-10-CM

## 2013-04-04 DIAGNOSIS — Z993 Dependence on wheelchair: Secondary | ICD-10-CM

## 2013-04-04 DIAGNOSIS — K766 Portal hypertension: Secondary | ICD-10-CM | POA: Diagnosis present

## 2013-04-04 DIAGNOSIS — G629 Polyneuropathy, unspecified: Secondary | ICD-10-CM

## 2013-04-04 DIAGNOSIS — E039 Hypothyroidism, unspecified: Secondary | ICD-10-CM | POA: Diagnosis present

## 2013-04-04 DIAGNOSIS — D649 Anemia, unspecified: Secondary | ICD-10-CM

## 2013-04-04 DIAGNOSIS — E7220 Disorder of urea cycle metabolism, unspecified: Secondary | ICD-10-CM

## 2013-04-04 DIAGNOSIS — E876 Hypokalemia: Secondary | ICD-10-CM

## 2013-04-04 DIAGNOSIS — R109 Unspecified abdominal pain: Secondary | ICD-10-CM

## 2013-04-04 DIAGNOSIS — L8992 Pressure ulcer of unspecified site, stage 2: Secondary | ICD-10-CM | POA: Diagnosis present

## 2013-04-04 DIAGNOSIS — R6510 Systemic inflammatory response syndrome (SIRS) of non-infectious origin without acute organ dysfunction: Secondary | ICD-10-CM

## 2013-04-04 DIAGNOSIS — Z992 Dependence on renal dialysis: Secondary | ICD-10-CM

## 2013-04-04 DIAGNOSIS — Z23 Encounter for immunization: Secondary | ICD-10-CM

## 2013-04-04 DIAGNOSIS — I120 Hypertensive chronic kidney disease with stage 5 chronic kidney disease or end stage renal disease: Secondary | ICD-10-CM | POA: Diagnosis present

## 2013-04-04 DIAGNOSIS — Z66 Do not resuscitate: Secondary | ICD-10-CM | POA: Diagnosis present

## 2013-04-04 DIAGNOSIS — Z87891 Personal history of nicotine dependence: Secondary | ICD-10-CM

## 2013-04-04 DIAGNOSIS — D62 Acute posthemorrhagic anemia: Secondary | ICD-10-CM

## 2013-04-04 DIAGNOSIS — E43 Unspecified severe protein-calorie malnutrition: Secondary | ICD-10-CM | POA: Diagnosis present

## 2013-04-04 DIAGNOSIS — A419 Sepsis, unspecified organism: Secondary | ICD-10-CM | POA: Diagnosis present

## 2013-04-04 DIAGNOSIS — F1020 Alcohol dependence, uncomplicated: Secondary | ICD-10-CM | POA: Diagnosis present

## 2013-04-04 DIAGNOSIS — K7030 Alcoholic cirrhosis of liver without ascites: Secondary | ICD-10-CM | POA: Diagnosis present

## 2013-04-04 DIAGNOSIS — R7881 Bacteremia: Secondary | ICD-10-CM

## 2013-04-04 DIAGNOSIS — Z515 Encounter for palliative care: Secondary | ICD-10-CM

## 2013-04-04 DIAGNOSIS — K31811 Angiodysplasia of stomach and duodenum with bleeding: Secondary | ICD-10-CM | POA: Diagnosis present

## 2013-04-04 DIAGNOSIS — Z8249 Family history of ischemic heart disease and other diseases of the circulatory system: Secondary | ICD-10-CM

## 2013-04-04 DIAGNOSIS — IMO0002 Reserved for concepts with insufficient information to code with codable children: Secondary | ICD-10-CM | POA: Diagnosis present

## 2013-04-04 LAB — CBC WITH DIFFERENTIAL/PLATELET
Basophils Absolute: 0.1 10*3/uL (ref 0.0–0.1)
Eosinophils Absolute: 0.7 10*3/uL (ref 0.0–0.7)
Eosinophils Relative: 9 % — ABNORMAL HIGH (ref 0–5)
Lymphs Abs: 1.4 10*3/uL (ref 0.7–4.0)
MCH: 30.3 pg (ref 26.0–34.0)
MCHC: 32 g/dL (ref 30.0–36.0)
MCV: 94.8 fL (ref 78.0–100.0)
Platelets: 139 10*3/uL — ABNORMAL LOW (ref 150–400)
RDW: 18.5 % — ABNORMAL HIGH (ref 11.5–15.5)

## 2013-04-04 LAB — OCCULT BLOOD, POC DEVICE: Fecal Occult Bld: POSITIVE — AB

## 2013-04-04 LAB — COMPREHENSIVE METABOLIC PANEL
ALT: 12 U/L (ref 0–53)
AST: 31 U/L (ref 0–37)
Albumin: 1.2 g/dL — ABNORMAL LOW (ref 3.5–5.2)
Alkaline Phosphatase: 194 U/L — ABNORMAL HIGH (ref 39–117)
Chloride: 98 mEq/L (ref 96–112)
Potassium: 3.8 mEq/L (ref 3.5–5.1)
Total Bilirubin: 0.7 mg/dL (ref 0.3–1.2)

## 2013-04-04 LAB — GLUCOSE, CAPILLARY

## 2013-04-04 LAB — LIPASE, BLOOD: Lipase: 66 U/L — ABNORMAL HIGH (ref 11–59)

## 2013-04-04 MED ORDER — INSULIN ASPART 100 UNIT/ML ~~LOC~~ SOLN: 0.0000 [IU] | Freq: Three times a day (TID) | SUBCUTANEOUS | Status: DC

## 2013-04-04 MED ORDER — POLYETHYLENE GLYCOL 3350 17 G PO PACK: 17.0000 g | PACK | Freq: Every day | ORAL | Status: DC | PRN

## 2013-04-04 MED ORDER — INFLUENZA VAC SPLIT QUAD 0.5 ML IM SUSP
0.5000 mL | INTRAMUSCULAR | Status: AC
  Filled 2013-04-04: qty 0.5

## 2013-04-04 MED ORDER — SODIUM CHLORIDE 0.9 % IJ SOLN
3.0000 mL | Freq: Two times a day (BID) | INTRAMUSCULAR | Status: DC
  Administered 2013-04-05 – 2013-04-16 (×17): 3 mL via INTRAVENOUS

## 2013-04-04 MED ORDER — BISACODYL 5 MG PO TBEC: 5.0000 mg | DELAYED_RELEASE_TABLET | Freq: Every day | ORAL | Status: DC | PRN

## 2013-04-04 MED ORDER — SEVELAMER CARBONATE 800 MG PO TABS
1600.0000 mg | ORAL_TABLET | Freq: Three times a day (TID) | ORAL | Status: DC
  Administered 2013-04-05 (×2): 1600 mg via ORAL
  Filled 2013-04-04 (×2): qty 2

## 2013-04-04 MED ORDER — INSULIN ASPART 100 UNIT/ML ~~LOC~~ SOLN
0.0000 [IU] | Freq: Three times a day (TID) | SUBCUTANEOUS | Status: DC
  Administered 2013-04-04: 2 [IU] via SUBCUTANEOUS
  Administered 2013-04-06 – 2013-04-07 (×3): 1 [IU] via SUBCUTANEOUS
  Administered 2013-04-07: 2 [IU] via SUBCUTANEOUS
  Administered 2013-04-07 – 2013-04-08 (×2): 1 [IU] via SUBCUTANEOUS
  Administered 2013-04-10: 3 [IU] via SUBCUTANEOUS
  Administered 2013-04-10 – 2013-04-11 (×3): 2 [IU] via SUBCUTANEOUS
  Administered 2013-04-11: 1 [IU] via SUBCUTANEOUS
  Administered 2013-04-11: 2 [IU] via SUBCUTANEOUS
  Administered 2013-04-12: 3 [IU] via SUBCUTANEOUS
  Administered 2013-04-12: 1 [IU] via SUBCUTANEOUS
  Administered 2013-04-13 – 2013-04-14 (×4): 5 [IU] via SUBCUTANEOUS

## 2013-04-04 MED ORDER — TRAMADOL HCL 50 MG PO TABS
50.0000 mg | ORAL_TABLET | Freq: Two times a day (BID) | ORAL | Status: DC | PRN
  Administered 2013-04-05 – 2013-04-14 (×6): 50 mg via ORAL
  Filled 2013-04-04 (×8): qty 1

## 2013-04-04 MED ORDER — FOLIC ACID 1 MG PO TABS
1.0000 mg | ORAL_TABLET | Freq: Every day | ORAL | Status: DC
  Administered 2013-04-05 – 2013-04-14 (×9): 1 mg via ORAL
  Filled 2013-04-04 (×10): qty 1

## 2013-04-04 MED ORDER — PANTOPRAZOLE SODIUM 40 MG PO TBEC
40.0000 mg | DELAYED_RELEASE_TABLET | Freq: Two times a day (BID) | ORAL | Status: DC
  Administered 2013-04-05 – 2013-04-11 (×13): 40 mg via ORAL
  Filled 2013-04-04 (×14): qty 1

## 2013-04-04 MED ORDER — ENSURE COMPLETE PO LIQD
237.0000 mL | Freq: Two times a day (BID) | ORAL | Status: DC
  Administered 2013-04-10 – 2013-04-14 (×5): 237 mL via ORAL
  Filled 2013-04-04: qty 237

## 2013-04-04 MED ORDER — FERROUS SULFATE 325 (65 FE) MG PO TABS
325.0000 mg | ORAL_TABLET | Freq: Two times a day (BID) | ORAL | Status: DC
  Administered 2013-04-05 (×2): 325 mg via ORAL
  Filled 2013-04-04 (×2): qty 1

## 2013-04-04 MED ORDER — ONDANSETRON HCL 4 MG PO TABS
4.0000 mg | ORAL_TABLET | Freq: Every day | ORAL | Status: DC | PRN
  Administered 2013-04-06 – 2013-04-16 (×2): 4 mg via ORAL
  Filled 2013-04-04 (×2): qty 1

## 2013-04-04 MED ORDER — ONDANSETRON HCL 4 MG/2ML IJ SOLN
4.0000 mg | Freq: Once | INTRAMUSCULAR | Status: AC
  Administered 2013-04-04: 4 mg via INTRAVENOUS
  Filled 2013-04-04: qty 2

## 2013-04-04 MED ORDER — MORPHINE SULFATE 4 MG/ML IJ SOLN
4.0000 mg | Freq: Once | INTRAMUSCULAR | Status: AC
  Administered 2013-04-04: 4 mg via INTRAVENOUS
  Filled 2013-04-04: qty 1

## 2013-04-04 MED ORDER — GERHARDT'S BUTT CREAM
TOPICAL_CREAM | Freq: Two times a day (BID) | CUTANEOUS | Status: DC
  Administered 2013-04-05 – 2013-04-14 (×20): via TOPICAL
  Administered 2013-04-15: 1 via TOPICAL
  Administered 2013-04-15: 21:00:00 via TOPICAL
  Filled 2013-04-04 (×2): qty 1

## 2013-04-04 MED ORDER — LACTULOSE 10 GM/15ML PO SOLN
20.0000 g | Freq: Two times a day (BID) | ORAL | Status: DC
  Administered 2013-04-04 – 2013-04-06 (×3): 20 g via ORAL
  Filled 2013-04-04 (×3): qty 30

## 2013-04-04 MED ORDER — METOCLOPRAMIDE HCL 10 MG PO TABS
5.0000 mg | ORAL_TABLET | Freq: Three times a day (TID) | ORAL | Status: DC
  Administered 2013-04-04 – 2013-04-15 (×37): 5 mg via ORAL
  Filled 2013-04-04 (×41): qty 1

## 2013-04-04 MED ORDER — FOLIC ACID 400 MCG PO TABS: 400.0000 ug | ORAL_TABLET | Freq: Every day | ORAL | Status: DC

## 2013-04-04 NOTE — H&P (Addendum)
[facility] History and Physical  Timothy Chan EAV:[id number] DOB: 06/26/1951 DOA: 04/04/2013  Referring physician:  Donnetta Hutching PCP:  Ignatius Specking., MD   Chief Complaint:  Fall with right upper quandrant pain  HPI:  The patient is a 62 y.o. 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 Karilyn Cota, HTN, and T2DM who presents with fall with right upper quadrant pain.  The patient was last at their baseline health many weeks ago.  The patient states that he was not feeling well after his discharge from [facility], he was then subsequently readmitted a few weeks ago [facility] with atypical chest pain. He was discharged [facility] Cliche and he has had ongoing hematemesis and bloody stools. He states that his hematemesis has mostly resolved and he has not had vomiting in the last couple of days. He previously had dark red stools but they have become melanotic.  His last stool was yesterday.  He has felt cold and fatigued.  At the nursing home, he is normally bed bound or use of the wheelchair. This morning, he was undergoing care and he was lying on his side on into the bed. His top legs slid off the bed and caused his whole body to fall onto the floor causing him to limp primarily on his right abdomen.  Since that time he has had 8/10 pain while resting which worsens to 12 out of 10 with movement. He denies radiation of the pain.  He presented to the emergency department because he was concerned that the injury to his right abdomen may have dislodged his hepatic stent.  He normally undergoes hemodialysis on Monday, Wednesday, and Friday. Because of his visit to the emergency department today, he has missed his normal dialysis. Of note, he states that over the last few weeks,  he has not been able to complete his hemodialysis sessions because of hypotension and chest pain.  In the emergency department, the patient was tachycardic to the low 100s, blood pressures were stable. He labs were notable for hemoglobin of 6.4, platelets 139, sodium 131, creatinine 5.98. Abdominal x-ray demonstrated copious stool without evidence of bowel obstruction.  I personally performed the rectal exam which demonstrated very dark brown stool that was sticky, no gross blood.  Hemoccult card was sent to the lab.  Review of Systems:  General:  Denies fevers, chills.  Recent weight gain.  HEENT:  Denies changes to hearing and vision, rhinorrhea, sinus congestion, sore throat CV:  Chest pains which occur with hemodialysis primarily PULM:  Mild SOB and some cough.   GI:  Nausea and vomiting have been improving, but still having bloody stools.     GU:  Denies dysuria, frequency, urgency.   ENDO:  Denies polyuria, polydipsia.   HEME:  Denies abnormal bruising or bleeding.  LYMPH:  Denies lymphadenopathy.   MSK:  Denies arthralgias, myalgias.   DERM:  Skin rash on buttocks NEURO:  Denies focal numbness, weakness, slurred speech, confusion, facial droop.  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 Rectal bleeding   . Dialysis care   . GI bleed   . Hypertension   . Esophageal varices   . Alcoholic liver disease   . Hepatic encephalopathy   . Ascites   . Diabetes  mellitus   . Detached retina   . Pneumonia   . 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 ESRD on hemodialysis    Past Surgical History  Procedure Laterality Date  . Esophagogastroduodenoscopy  12/31/2010    EGD [facility] THERAPY  . Esophagogastroduodenoscopy  05/31/2012E    GD [facility] ABLATION  . Small bowel givens  12/01/2010  . Colonoscopy  09/07/2010  . Esophagogastroduodenoscopy  09/05/2010    OUTLAW  . Lung surgery  6/11    [place]  . Esophagogastroduodenoscopy   03/26/2011    Procedure: ESOPHAGOGASTRODUODENOSCOPY (EGD);  Surgeon: Malissa Hippo, MD;  [facility];  Service: Endoscopy;  Laterality: N/A;  8:30 am  . Hot hemostasis  03/26/2011    Procedure: HOT HEMOSTASIS (ARGON PLASMA COAGULATION/BICAP);  Surgeon: Malissa Hippo, MD;  [facility];  Service: Endoscopy;  Laterality: N/A;  . Stent in liver    . Esophagogastroduodenoscopy N/A 10/24/2012    Procedure: ESOPHAGOGASTRODUODENOSCOPY (EGD);  Surgeon: Malissa Hippo, MD;  [facility];  Service: Endoscopy;  Laterality: N/A;  . Esophagogastroduodenoscopy N/A 11/22/2012    Procedure: ESOPHAGOGASTRODUODENOSCOPY (EGD);  Surgeon: Charna Elizabeth, MD;  Location: [facility] ENDOSCOPY;  Service: Endoscopy;  Laterality: N/A;  . Paracentesis    . Esophagogastroduodenoscopy N/A 12/21/2012    Procedure: ESOPHAGOGASTRODUODENOSCOPY (EGD);  Surgeon: Malissa Hippo, MD;  [facility];  Service: Endoscopy;  Laterality: N/A;  250-moved to 155 Ann to notify pt  . Hot hemostasis N/A 12/21/2012    Procedure: HOT HEMOSTASIS (ARGON PLASMA COAGULATION/BICAP);  Surgeon: Malissa Hippo, MD;  [facility];  Service: Endoscopy;  Laterality: N/A;  . Esophagogastroduodenoscopy N/A 01/25/2013    Procedure: ESOPHAGOGASTRODUODENOSCOPY (EGD);  Surgeon: Malissa Hippo, MD;  [facility];  Service: Endoscopy;  Laterality: N/A;  100  . Hot hemostasis N/A 01/25/2013    Procedure: HOT HEMOSTASIS (ARGON PLASMA COAGULATION/BICAP);  Surgeon: Malissa Hippo, MD;  [facility];  Service: Endoscopy;  Laterality: N/A;  . Esophagogastroduodenoscopy N/A 02/16/2013    Procedure: ESOPHAGOGASTRODUODENOSCOPY (EGD);  Surgeon: Malissa Hippo, MD;  [facility];  Service: Endoscopy;  Laterality: N/A;  . Esophagogastroduodenoscopy N/A 03/07/2013    Procedure: ESOPHAGOGASTRODUODENOSCOPY (EGD);  Surgeon: Malissa Hippo, MD;  [facility];  Service: Endoscopy;  Laterality:  N/A;  . Esophagogastroduodenoscopy N/A 03/09/2013    Procedure: ESOPHAGOGASTRODUODENOSCOPY (EGD);  Surgeon: Malissa Hippo, MD;  [facility];  Service: Endoscopy;  Laterality: N/A;   Social History:  reports that he quit smoking about 19 months ago. His smoking use included Cigarettes. He has a 30 pack-year smoking history. He quit smokeless tobacco use about 10 months ago. He reports that he does not drink alcohol or use illicit drugs. Living at [facility]. Wheelchair bound.    Allergies  Allergen Reactions  . Tylenol [Acetaminophen] Other (See Comments)    cirrhosis    Family History  Problem Relation Age of Onset  . Kidney failure Neg Hx   . CAD Father   . High blood pressure Brother   . Gout Brother      Prior to Admission medications   Medication Sig Start Date End Date Taking? Authorizing Provider  ferrous sulfate 325 (65 FE) MG tablet Take 1 tablet (325 mg total) by mouth 2 (two) times daily with a meal. 03/16/13  Yes Standley Brooking, MD  folic acid (FOLVITE) 400 MCG tablet  Take 400 mcg by mouth daily.   Yes Historical Provider, MD  hydrocortisone valerate ointment (WESTCORT) 0.2 % Apply 1 application topically every Monday, Wednesday, and Friday. For numbing prior to dialysis   Yes Historical Provider, MD  lactulose (CHRONULAC) 10 GM/15ML solution Take 20 g by mouth daily.  11/25/12  Yes Christiane Ha, MD  metoCLOPramide (REGLAN) 5 MG tablet Take 1 tablet (5 mg total) by mouth 4 (four) times daily -  before meals and at bedtime. 03/16/13  Yes Standley Brooking, MD  ondansetron (ZOFRAN) 4 MG tablet Take 4 mg by mouth daily as needed for nausea.    Yes Historical Provider, MD  pantoprazole (PROTONIX) 40 MG tablet Take 1 tablet (40 mg total) by mouth 2 (two) times daily before a meal. 02/27/13  Yes Richarda Overlie, MD  sevelamer carbonate (RENVELA) 800 MG tablet Take 2 tablets (1,600 mg total) by mouth 3 (three) times daily with meals. 02/27/13  Yes Richarda Overlie, MD   sucralfate (CARAFATE) 1 GM/10ML suspension Take 10 mLs (1 g total) by mouth 4 (four) times daily. 01/15/13  Yes Flint Melter, MD  traMADol (ULTRAM) 50 MG tablet Take 1 tablet (50 mg total) by mouth 3 (three) times daily as needed for pain. 03/28/13  Yes Claudie Revering, NP   Physical Exam: Filed Vitals:   04/04/13 1211 04/04/13 1425  BP: 122/63 103/57  Pulse: 109 101  Temp: 98.6 F (37 C)   TempSrc: Oral   Resp: 18 16  SpO2: 100% 98%     General:  Cachectic Caucasian male, no acute distress  Eyes:  PERRL, anicteric, non-injected.  ENT:  Nares clear.  OP clear, non-erythematous without plaques or exudates.  MMM.  Neck:  Supple without TM or JVD.    Lymph:  No cervical, supraclavicular, or submandibular LAD.  Cardiovascular:  RRR, normal S1, S2, without m/r/g.  2+ pulses, warm extremities  Respiratory:  CTA bilaterally without increased WOB.  Abdomen:  NABS.  Soft,  moderately distended, tender to palpation in the right upper quadrant without rebound or guarding    Skin:  Erythematous skin rash mostly confluent on the buttocks with diffuse stage of 1-2 sacral decubitus ulcers. Borders are somewhat raised.    Musculoskeletal:   decreased bulk and tone.   3+ LE edema with compression dressings in place   Psychiatric:  A & O x 4.  Appropriate affect.  Neurologic:  CN 3-12 intact.  4/5 strength.  Sensation intact.  Labs on Admission:  Basic Metabolic Panel:  Recent Labs Lab 04/04/13 1407  NA 131*  K 3.8  CL 98  CO2 26  GLUCOSE 201*  BUN 39*  CREATININE 5.98*  CALCIUM 8.4   Liver Function Tests:  Recent Labs Lab 04/04/13 1407  AST 31  ALT 12  ALKPHOS 194*  BILITOT 0.7  PROT 5.4*  ALBUMIN 1.2*    Recent Labs Lab 04/04/13 1407  LIPASE 66*   No results found for this basename: AMMONIA,  in the last 168 hours CBC:  Recent Labs Lab 04/04/13 1407  WBC 8.0  NEUTROABS 4.6  HGB 6.4*  HCT 20.0*  MCV 94.8  PLT 139*   Cardiac Enzymes: No results  found for this basename: CKTOTAL, CKMB, CKMBINDEX, TROPONINI,  in the last 168 hours  BNP (last 3 results)  Recent Labs  11/21/12 1145  PROBNP 1167.0*   CBG:  Recent Labs Lab 04/04/13 1211  GLUCAP 199*    Radiological Exams on Admission: Dg Abd  Acute W/chest  04/04/2013   CLINICAL DATA:  Right upper quadrant pain, history dialysis, hypertension, alcoholic liver disease, diabetes mellitus, hepatic encephalopathy  EXAM: ACUTE ABDOMEN SERIES (ABDOMEN 2 VIEW & CHEST 1 VIEW)  COMPARISON:  Chest radiograph 03/26/2013, abdominal radiographs 02/18/2013  FINDINGS: Mild enlargement of cardiac silhouette.  Mediastinal contours and pulmonary vascularity normal.  Bibasilar atelectasis with bronchitic and probable underlying emphysematous changes.  Remaining lungs clear.  Small right pleural effusion noted.  TIPS shunt in right upper quadrant.  Examination limited by body habitus.  Probable ascites.  Nonobstructive bowel gas pattern with scattered mildly increased stool throughout colon.  No bowel dilatation, bowel wall thickening or obvious free intraperitoneal air.  Bones demineralized.  IMPRESSION: Probable COPD changes with minimal bibasilar atelectasis and small right pleural effusion.  Probable ascites.  Nonobstructive bowel gas pattern with increased stool throughout colon.   Electronically Signed   By: Ulyses Southward M.D.   On: 04/04/2013 15:14    EKG: pending  Assessment/Plan Principal Problem:   Acute blood loss anemia Active Problems:   Alcoholic cirrhosis/remote TIPS   Diabetes mellitus type 2, uncontrolled, with complications   CKD (chronic kidney disease) stage V requiring chronic dialysis   GI bleed   Abdominal pain   Thrombocytopenia, unspecified   GAVE (gastric antral vascular ectasia)   Protein-calorie malnutrition, severe   Chest pain  ---   Right upper quadrant abdominal pain:  DDx includes bruising, muscle strain, hematoma formation, constipation -  CT scan of the right  upper quadrant to rule out laceration or perihepatic hematoma or dislodged liver stent -  Repeat CBC post transfusion  Acute blood loss anemia, differential diagnoses includes acute on chronic GI bleeding from GAVE, portal hypertensive gastropathy.   -  Transfuse 1 units of packed red blood cells -  Repeat CBC posttransfusion -  May give additional blood tomorrow morning with dialysis -  Iron studies, B12, folate, TSH  GAVE with recurrent UGIB  -  Continue PPI   -  Please minimize the use of carafate which contains aluminum in this ESRD patient -  Spoke with Dr. Si Gaul who will officially consult in AM -  Occult stool pending  End-stage renal disease -  Will need hemodialysis, likely tomorrow -  Nephrology consult -  Continue home phosphorus binders, multivitamin -  Please verify that patient is receiving additional epo and iron with HD.    Alcoholic cirrhosis -  Continue low salt diet -  Increase lactulose in the setting of GIB to prevent  Hepatic encephalopathy  Constipation, may be contributing to his abdominal pain also -   Increase to TID lactulose  Severe protein calorie malnutrition -  Low salt diet with supplements  HTN, blood pressures low normal and hypoTN with HD recently -  Consider addition of midodrine prior to HD -  Avoid medications to lower BP  Hx of DM, last A1c in 4 range a few months ago.  Atypical chest pain, may have anginal pain.  Patient has risk factors, including HTN, HLD, hx of tobacco use, hx of DM, and ESRD.   -  If recurrent, consider cardiology consult for cath  Diet:  Low salt per patient request Access:  Fistula, PIV IVF:  OFF Proph:  SCDs  Code Status: full code Family Communication: spoke with patient alone Disposition Plan: Admit to telemetry  Time spent: 60 min Renae Fickle [facility] Pager [phone]  If 7PM-7AM, please contact night-coverage [email or web address] Password [facility] 04/04/2013, 5:28 PM

## 2013-04-04 NOTE — ED Notes (Signed)
Pt from [facility] nursing facility.  Per ems, pt has recently had stents placed in his liver and is having pain to his rt flank area d/t his rt leg dropping from the bed this morning.  Pt reports pain 8/10.

## 2013-04-04 NOTE — ED Notes (Signed)
Lab called. Critical Hgb 6.4.  Notified edp

## 2013-04-04 NOTE — ED Provider Notes (Signed)
CSN: [id number]     Arrival date & time 04/04/13  1212 History   First MD Initiated Contact with Patient 04/04/13 1218     Chief Complaint  Patient presents with  . Flank Pain   (Consider location/radiation/quality/duration/timing/severity/associated sxs/prior Treatment) Patient is a 62 y.o. male presenting with flank pain.  Flank Pain  .[name]... status post fall out of bed earlier today landing on his right abdomen. No chest pain, shortness breath, neuro deficits.   Patient allegedly has had stents placed in his biliary tree at [facility] secondary to cirrhosis and liver failure. Severity is mild. Nothing makes symptoms better or worse. No head or neck trauma  Past Medical History  Diagnosis Date  . Rectal bleeding   . Dialysis care   . GI bleed   . Hypertension   . Esophageal varices   . Alcoholic liver disease   . Hepatic encephalopathy   . Ascites   . Diabetes mellitus   . Detached retina   . Pneumonia   . 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 ESRD on hemodialysis    Past Surgical History  Procedure Laterality Date  . Esophagogastroduodenoscopy  12/31/2010    EGD [facility] THERAPY  . Esophagogastroduodenoscopy  05/31/2012E    GD [facility] ABLATION  . Small bowel givens  12/01/2010  . Colonoscopy  09/07/2010  . Esophagogastroduodenoscopy  09/05/2010    OUTLAW  . Lung surgery  6/11    [place]  . Esophagogastroduodenoscopy  03/26/2011    Procedure: ESOPHAGOGASTRODUODENOSCOPY (EGD);  Surgeon: Malissa Hippo, MD;  [facility];  Service: Endoscopy;  Laterality: N/A;  8:30 am  . Hot hemostasis  03/26/2011    Procedure: HOT HEMOSTASIS (ARGON PLASMA COAGULATION/BICAP);  Surgeon: Malissa Hippo, MD;  [facility];  Service: Endoscopy;  Laterality: N/A;  . Stent in liver    . Esophagogastroduodenoscopy N/A 10/24/2012    Procedure: ESOPHAGOGASTRODUODENOSCOPY (EGD);  Surgeon: Malissa Hippo, MD;  [facility];  Service:  Endoscopy;  Laterality: N/A;  . Esophagogastroduodenoscopy N/A 11/22/2012    Procedure: ESOPHAGOGASTRODUODENOSCOPY (EGD);  Surgeon: Charna Elizabeth, MD;  Location[facility] ENDOSCOPY;  Service: Endoscopy;  Laterality: N/A;  . Paracentesis    . Esophagogastroduodenoscopy N/A 12/21/2012    Procedure: ESOPHAGOGASTRODUODENOSCOPY (EGD);  Surgeon: Malissa Hippo, MD;  [facility];  Service: Endoscopy;  Laterality: N/A;  250-moved to 155 Ann to notify pt  . Hot hemostasis N/A 12/21/2012    Procedure: HOT HEMOSTASIS (ARGON PLASMA COAGULATION/BICAP);  Surgeon: Malissa Hippo, MD;  [facility];  Service: Endoscopy;  Laterality: N/A;  . Esophagogastroduodenoscopy N/A 01/25/2013    Procedure: ESOPHAGOGASTRODUODENOSCOPY (EGD);  Surgeon: Malissa Hippo, MD;  [facility];  Service: Endoscopy;  Laterality: N/A;  100  . Hot hemostasis N/A 01/25/2013    Procedure: HOT HEMOSTASIS (ARGON PLASMA COAGULATION/BICAP);  Surgeon: Malissa Hippo, MD;  [facility];  Service: Endoscopy;  Laterality: N/A;  . Esophagogastroduodenoscopy N/A 02/16/2013    Procedure: ESOPHAGOGASTRODUODENOSCOPY (EGD);  Surgeon: Malissa Hippo, MD;  [facility];  Service: Endoscopy;  Laterality: N/A;  . Esophagogastroduodenoscopy N/A 03/07/2013    Procedure: ESOPHAGOGASTRODUODENOSCOPY (EGD);  Surgeon: Malissa Hippo, MD;  [facility];  Service: Endoscopy;  Laterality: N/A;  . Esophagogastroduodenoscopy N/A 03/09/2013    Procedure: ESOPHAGOGASTRODUODENOSCOPY (EGD);  Surgeon: Malissa Hippo, MD;  [facility];  Service: Endoscopy;  Laterality: N/A;  No family history on file. History  Substance Use Topics  . Smoking status: Former Smoker -- 1.00 packs/day for 30 years    Types: Cigarettes    Quit date: 08/11/2011  . Smokeless tobacco: Former Neurosurgeon    Quit date: 05/12/2012  . Alcohol Use: No     Comment: denies-quit 2000    Review of Systems  Genitourinary: Positive for  flank pain.  All other systems reviewed and are negative.    Allergies  Tylenol  Home Medications   Current Outpatient Rx  Name  Route  Sig  Dispense  Refill  . ferrous sulfate 325 (65 FE) MG tablet   Oral   Take 1 tablet (325 mg total) by mouth 2 (two) times daily with a meal.         . folic acid (FOLVITE) 400 MCG tablet   Oral   Take 400 mcg by mouth daily.         . hydrocortisone valerate ointment (WESTCORT) 0.2 %   Topical   Apply 1 application topically every Monday, Wednesday, and Friday. For numbing prior to dialysis         . lactulose (CHRONULAC) 10 GM/15ML solution   Oral   Take 20 g by mouth daily.          . metoCLOPramide (REGLAN) 5 MG tablet   Oral   Take 1 tablet (5 mg total) by mouth 4 (four) times daily -  before meals and at bedtime.         . ondansetron (ZOFRAN) 4 MG tablet   Oral   Take 4 mg by mouth daily as needed for nausea.          . pantoprazole (PROTONIX) 40 MG tablet   Oral   Take 1 tablet (40 mg total) by mouth 2 (two) times daily before a meal.   120 tablet   2   . sevelamer carbonate (RENVELA) 800 MG tablet   Oral   Take 2 tablets (1,600 mg total) by mouth 3 (three) times daily with meals.   180 tablet   2   . sucralfate (CARAFATE) 1 GM/10ML suspension   Oral   Take 10 mLs (1 g total) by mouth 4 (four) times daily.   420 mL   0   . traMADol (ULTRAM) 50 MG tablet   Oral   Take 1 tablet (50 mg total) by mouth 3 (three) times daily as needed for pain.   90 tablet   5    BP 103/57  Pulse 101  Temp(Src) 98.6 F (37 C) (Oral)  Resp 16  SpO2 98% Physical Exam  Nursing note and vitals reviewed. Constitutional: He is oriented to person, place, and time.  Looks ill, pale, slightly jaundiced  HENT:  Head: Normocephalic and atraumatic.  Eyes: Conjunctivae and EOM are normal. Pupils are equal, round, and reactive to light.  Neck: Normal range of motion. Neck supple.  Cardiovascular: Normal rate, regular rhythm  and normal heart sounds.   Pulmonary/Chest: Effort normal and breath sounds normal.  Abdominal:  Abdomen is ascitic  Musculoskeletal: Normal range of motion.  Neurological: He is alert and oriented to person, place, and time.  Skin: Skin is warm and dry.  Psychiatric: He has a normal mood and affect.    ED Course  Procedures (including critical care time) Labs Review Labs Reviewed  COMPREHENSIVE METABOLIC PANEL - Abnormal; Notable for the following:    Sodium 131 (*)    Glucose, Bld 201 (*)  BUN 39 (*)    Creatinine, Ser 5.98 (*)    Total Protein 5.4 (*)    Albumin 1.2 (*)    Alkaline Phosphatase 194 (*)    GFR calc non Af Amer 9 (*)    GFR calc Af Amer 11 (*)    All other components within normal limits  CBC WITH DIFFERENTIAL - Abnormal; Notable for the following:    RBC 2.11 (*)    Hemoglobin 6.4 (*)    HCT 20.0 (*)    RDW 18.5 (*)    Platelets 139 (*)    Monocytes Relative 15 (*)    Monocytes Absolute 1.2 (*)    Eosinophils Relative 9 (*)    All other components within normal limits  LIPASE, BLOOD - Abnormal; Notable for the following:    Lipase 66 (*)    All other components within normal limits  GLUCOSE, CAPILLARY - Abnormal; Notable for the following:    Glucose-Capillary 199 (*)    All other components within normal limits  OCCULT BLOOD X 1 CARD TO LAB, STOOL  PREPARE RBC (CROSSMATCH)   Imaging Review Dg Abd Acute W/chest  04/04/2013   CLINICAL DATA:  Right upper quadrant pain, history dialysis, hypertension, alcoholic liver disease, diabetes mellitus, hepatic encephalopathy  EXAM: ACUTE ABDOMEN SERIES (ABDOMEN 2 VIEW & CHEST 1 VIEW)  COMPARISON:  Chest radiograph 03/26/2013, abdominal radiographs 02/18/2013  FINDINGS: Mild enlargement of cardiac silhouette.  Mediastinal contours and pulmonary vascularity normal.  Bibasilar atelectasis with bronchitic and probable underlying emphysematous changes.  Remaining lungs clear.  Small right pleural effusion noted.   TIPS shunt in right upper quadrant.  Examination limited by body habitus.  Probable ascites.  Nonobstructive bowel gas pattern with scattered mildly increased stool throughout colon.  No bowel dilatation, bowel wall thickening or obvious free intraperitoneal air.  Bones demineralized.  IMPRESSION: Probable COPD changes with minimal bibasilar atelectasis and small right pleural effusion.  Probable ascites.  Nonobstructive bowel gas pattern with increased stool throughout colon.   Electronically Signed   By: Ulyses Southward M.D.   On: 04/04/2013 15:14    MDM   1. Cirrhosis   2. End stage renal disease   3. Anemia     Patient has known kidney and liver failure. Will transfuse secondary to symptomatic anemia. Gastroenterologist consulted.   No aggressive intervention at this time.    Donnetta Hutching, MD 04/04/13 [phone]

## 2013-04-05 DIAGNOSIS — K7030 Alcoholic cirrhosis of liver without ascites: Secondary | ICD-10-CM

## 2013-04-05 LAB — COMPREHENSIVE METABOLIC PANEL
AST: 30 U/L (ref 0–37)
Albumin: 1.1 g/dL — ABNORMAL LOW (ref 3.5–5.2)
Alkaline Phosphatase: 165 U/L — ABNORMAL HIGH (ref 39–117)
BUN: 43 mg/dL — ABNORMAL HIGH (ref 6–23)
Chloride: 99 mEq/L (ref 96–112)
Creatinine, Ser: 6.46 mg/dL — ABNORMAL HIGH (ref 0.50–1.35)
Potassium: 4 mEq/L (ref 3.5–5.1)
Sodium: 133 mEq/L — ABNORMAL LOW (ref 135–145)
Total Bilirubin: 1 mg/dL (ref 0.3–1.2)
Total Protein: 5.3 g/dL — ABNORMAL LOW (ref 6.0–8.3)

## 2013-04-05 LAB — IRON AND TIBC
Iron: 55 ug/dL (ref 42–135)
TIBC: 124 ug/dL — ABNORMAL LOW (ref 215–435)
UIBC: 69 ug/dL — ABNORMAL LOW (ref 125–400)

## 2013-04-05 LAB — GLUCOSE, CAPILLARY
Glucose-Capillary: 114 mg/dL — ABNORMAL HIGH (ref 70–99)
Glucose-Capillary: 113 mg/dL — ABNORMAL HIGH (ref 70–99)

## 2013-04-05 LAB — VITAMIN B12: Vitamin B-12: 1107 pg/mL — ABNORMAL HIGH (ref 211–911)

## 2013-04-05 LAB — CBC
HCT: 20.8 % — ABNORMAL LOW (ref 39.0–52.0)
MCH: 30.1 pg (ref 26.0–34.0)
MCHC: 32.7 g/dL (ref 30.0–36.0)
MCV: 92 fL (ref 78.0–100.0)
RBC: 2.26 MIL/uL — ABNORMAL LOW (ref 4.22–5.81)
RDW: 18.6 % — ABNORMAL HIGH (ref 11.5–15.5)
WBC: 7.5 10*3/uL (ref 4.0–10.5)

## 2013-04-05 LAB — TSH: TSH: 19.286 u[IU]/mL — ABNORMAL HIGH (ref 0.350–4.500)

## 2013-04-05 LAB — PROTIME-INR: Prothrombin Time: 15.9 seconds — ABNORMAL HIGH (ref 11.6–15.2)

## 2013-04-05 LAB — PREPARE RBC (CROSSMATCH)

## 2013-04-05 LAB — TRANSFERRIN: Transferrin: <100 mg/dL — ABNORMAL LOW (ref 200–360)

## 2013-04-05 MED ORDER — LIDOCAINE-PRILOCAINE 2.5-2.5 % EX CREA
TOPICAL_CREAM | Freq: Once | CUTANEOUS | Status: AC
  Administered 2013-04-05: 11:00:00 via TOPICAL

## 2013-04-05 MED ORDER — LIDOCAINE HCL (PF) 1 % IJ SOLN: 5.0000 mL | INTRAMUSCULAR | Status: DC | PRN

## 2013-04-05 MED ORDER — NEPRO/CARBSTEADY PO LIQD
237.0000 mL | ORAL | Status: DC | PRN
  Filled 2013-04-05: qty 237

## 2013-04-05 MED ORDER — SODIUM CHLORIDE 0.9 % IV SOLN: 100.0000 mL | INTRAVENOUS | Status: DC | PRN

## 2013-04-05 MED ORDER — HEPARIN SODIUM (PORCINE) 1000 UNIT/ML DIALYSIS
1000.0000 [IU] | INTRAMUSCULAR | Status: DC | PRN
  Filled 2013-04-05: qty 1

## 2013-04-05 MED ORDER — ALTEPLASE 2 MG IJ SOLR
2.0000 mg | Freq: Once | INTRAMUSCULAR | Status: AC | PRN
  Filled 2013-04-05: qty 2

## 2013-04-05 MED ORDER — MORPHINE SULFATE 2 MG/ML IJ SOLN
1.0000 mg | INTRAMUSCULAR | Status: DC | PRN
  Administered 2013-04-05 – 2013-04-09 (×6): 1 mg via INTRAVENOUS
  Filled 2013-04-05 (×6): qty 1

## 2013-04-05 MED ORDER — LIDOCAINE-PRILOCAINE 2.5-2.5 % EX CREA
1.0000 "application " | TOPICAL_CREAM | CUTANEOUS | Status: DC | PRN
  Administered 2013-04-07 – 2013-04-09 (×2): 1 via TOPICAL
  Filled 2013-04-05 (×2): qty 5

## 2013-04-05 MED ORDER — PENTAFLUOROPROP-TETRAFLUOROETH EX AERO
1.0000 "application " | INHALATION_SPRAY | CUTANEOUS | Status: DC | PRN
  Filled 2013-04-05: qty 103.5

## 2013-04-05 NOTE — Consult Note (Signed)
Reason for Consult: End-stage renal disease failure   Timothy Chan is an 62 y.o. male.  HPI:  patient history of cirrhosis, end-stage renal disease on maintenance hemodialysis presently came with complaints of weakness and feeling tired. Patient was found to very low hemoglobin and hematocrit. He has received 1 unit of blood transfusion. Hemoglobin and hematocrit is still remains low. Patient also complains of some nausea and poor appetite. Patient has been at [facility] recently for similar problem and discharged to nursing home about a week ago. He denies any difficulty in breathing.  Past Medical History  Diagnosis Date  . Rectal bleeding   . Dialysis care   . GI bleed   . Hypertension   . Esophageal varices   . Alcoholic liver disease   . Hepatic encephalopathy   . Ascites   . Diabetes mellitus   . Detached retina   . Pneumonia   . 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 ESRD on hemodialysis     Past Surgical History  Procedure Laterality Date  . Esophagogastroduodenoscopy  12/31/2010    EGD [facility] THERAPY  . Esophagogastroduodenoscopy  05/31/2012E    GD [facility] ABLATION  . Small bowel givens  12/01/2010  . Colonoscopy  09/07/2010  . Esophagogastroduodenoscopy  09/05/2010    OUTLAW  . Lung surgery  6/11    [place]  . Esophagogastroduodenoscopy  03/26/2011    Procedure: ESOPHAGOGASTRODUODENOSCOPY (EGD);  Surgeon: Malissa Hippo, MD;  [facility];  Service: Endoscopy;  Laterality: N/A;  8:30 am  . Hot hemostasis  03/26/2011    Procedure: HOT HEMOSTASIS (ARGON PLASMA COAGULATION/BICAP);  Surgeon: Malissa Hippo, MD;  [facility];  Service: Endoscopy;  Laterality: N/A;  . Stent in liver    . Esophagogastroduodenoscopy N/A 10/24/2012    Procedure: ESOPHAGOGASTRODUODENOSCOPY (EGD);  Surgeon: Malissa Hippo, MD;  [facility];  Service: Endoscopy;  Laterality: N/A;  . Esophagogastroduodenoscopy N/A 11/22/2012     Procedure: ESOPHAGOGASTRODUODENOSCOPY (EGD);  Surgeon: Charna Elizabeth, MD;  Location: [facility] ENDOSCOPY;  Service: Endoscopy;  Laterality: N/A;  . Paracentesis    . Esophagogastroduodenoscopy N/A 12/21/2012    Procedure: ESOPHAGOGASTRODUODENOSCOPY (EGD);  Surgeon: Malissa Hippo, MD;  [facility];  Service: Endoscopy;  Laterality: N/A;  250-moved to 155 Ann to notify pt  . Hot hemostasis N/A 12/21/2012    Procedure: HOT HEMOSTASIS (ARGON PLASMA COAGULATION/BICAP);  Surgeon: Malissa Hippo, MD;  [facility];  Service: Endoscopy;  Laterality: N/A;  . Esophagogastroduodenoscopy N/A 01/25/2013    Procedure: ESOPHAGOGASTRODUODENOSCOPY (EGD);  Surgeon: Malissa Hippo, MD;  [facility];  Service: Endoscopy;  Laterality: N/A;  100  . Hot hemostasis N/A 01/25/2013    Procedure: HOT HEMOSTASIS (ARGON PLASMA COAGULATION/BICAP);  Surgeon: Malissa Hippo, MD;  [facility];  Service: Endoscopy;  Laterality: N/A;  . Esophagogastroduodenoscopy N/A 02/16/2013    Procedure: ESOPHAGOGASTRODUODENOSCOPY (EGD);  Surgeon: Malissa Hippo, MD;  [facility];  Service: Endoscopy;  Laterality: N/A;  . Esophagogastroduodenoscopy N/A 03/07/2013    Procedure: ESOPHAGOGASTRODUODENOSCOPY (EGD);  Surgeon: Malissa Hippo, MD;  [facility];  Service: Endoscopy;  Laterality: N/A;  . Esophagogastroduodenoscopy N/A 03/09/2013    Procedure: ESOPHAGOGASTRODUODENOSCOPY (EGD);  Surgeon: Malissa Hippo, MD;  [facility];  Service: Endoscopy;  Laterality: N/A;    Family History  Problem Relation Age of Onset  . Kidney failure Neg Hx   .  CAD Father   . High blood pressure Brother   . Gout Brother     Social History:  reports that he quit smoking about 19 months ago. His smoking use included Cigarettes. He has a 30 pack-year smoking history. He quit smokeless tobacco use about 10 months ago. He reports that he does not drink alcohol or use illicit  drugs.  Allergies:  Allergies  Allergen Reactions  . Tylenol [Acetaminophen] Other (See Comments)    cirrhosis    Medications: I have reviewed the patient'Chan current medications.  Results for orders placed during the hospital encounter of 04/04/13 (from the past 48 hour(Chan))  GLUCOSE, CAPILLARY     Status: Abnormal   Collection Time    04/04/13 12:11 PM      Result Value Range   Glucose-Capillary 199 (*) 70 - 99 mg/dL  COMPREHENSIVE METABOLIC PANEL     Status: Abnormal   Collection Time    04/04/13  2:07 PM      Result Value Range   Sodium 131 (*) 135 - 145 mEq/L   Potassium 3.8  3.5 - 5.1 mEq/L   Chloride 98  96 - 112 mEq/L   CO2 26  19 - 32 mEq/L   Glucose, Bld 201 (*) 70 - 99 mg/dL   BUN 39 (*) 6 - 23 mg/dL   Creatinine, Ser 1.61 (*) 0.50 - 1.35 mg/dL   Calcium 8.4  8.4 - 09.6 mg/dL   Total Protein 5.4 (*) 6.0 - 8.3 g/dL   Albumin 1.2 (*) 3.5 - 5.2 g/dL   AST 31  0 - 37 U/L   ALT 12  0 - 53 U/L   Alkaline Phosphatase 194 (*) 39 - 117 U/L   Total Bilirubin 0.7  0.3 - 1.2 mg/dL   GFR calc non Af Amer 9 (*) >90 mL/min   GFR calc Af Amer 11 (*) >90 mL/min   Comment: (NOTE)     The eGFR has been calculated using the CKD EPI equation.     This calculation has not been validated in all clinical situations.     eGFR'Chan persistently <90 mL/min signify possible Chronic Kidney     Disease.  CBC WITH DIFFERENTIAL     Status: Abnormal   Collection Time    04/04/13  2:07 PM      Result Value Range   WBC 8.0  4.0 - 10.5 K/uL   RBC 2.11 (*) 4.22 - 5.81 MIL/uL   Hemoglobin 6.4 (*) 13.0 - 17.0 g/dL   Comment: RESULT REPEATED AND VERIFIED     CRITICAL RESULT CALLED TO, READ BACK BY AND VERIFIED WITH:     AMBER WILSON RN ON [id number] AT 1425 BY RESSEGGER R   HCT 20.0 (*) 39.0 - 52.0 %   MCV 94.8  78.0 - 100.0 fL   MCH 30.3  26.0 - 34.0 pg   MCHC 32.0  30.0 - 36.0 g/dL   RDW 81.1 (*) 91.4 - 78.2 %   Platelets 139 (*) 150 - 400 K/uL   Neutrophils Relative % 58  43 - 77 %   Neutro  Abs 4.6  1.7 - 7.7 K/uL   Lymphocytes Relative 18  12 - 46 %   Lymphs Abs 1.4  0.7 - 4.0 K/uL   Monocytes Relative 15 (*) 3 - 12 %   Monocytes Absolute 1.2 (*) 0.1 - 1.0 K/uL   Eosinophils Relative 9 (*) 0 - 5 %   Eosinophils Absolute 0.7  0.0 -  0.7 K/uL   Basophils Relative 1  0 - 1 %   Basophils Absolute 0.1  0.0 - 0.1 K/uL  LIPASE, BLOOD     Status: Abnormal   Collection Time    04/04/13  2:07 PM      Result Value Range   Lipase 66 (*) 11 - 59 U/L  PREPARE RBC (CROSSMATCH)     Status: None   Collection Time    04/04/13  4:45 PM      Result Value Range   Order Confirmation ORDER PROCESSED BY BLOOD BANK    TYPE AND SCREEN     Status: None   Collection Time    04/04/13  4:45 PM      Result Value Range   ABO/RH(D) B NEG     Antibody Screen NEG     Sample Expiration 04/07/2013     Unit Number [id number]     Blood Component Type RBC LR PHER1     Unit division 00     Status of Unit ISSUED     Transfusion Status OK TO TRANSFUSE     Crossmatch Result Compatible     Unit Number [id number]     Blood Component Type RED CELLS,LR     Unit division 00     Status of Unit ALLOCATED     Transfusion Status OK TO TRANSFUSE     Crossmatch Result Compatible    OCCULT BLOOD, POC DEVICE     Status: Abnormal   Collection Time    04/04/13  4:46 PM      Result Value Range   Fecal Occult Bld POSITIVE (*) NEGATIVE  GLUCOSE, CAPILLARY     Status: Abnormal   Collection Time    04/04/13  5:28 PM      Result Value Range   Glucose-Capillary 168 (*) 70 - 99 mg/dL   Comment 1 Notify RN    GLUCOSE, CAPILLARY     Status: Abnormal   Collection Time    04/04/13  9:11 PM      Result Value Range   Glucose-Capillary 155 (*) 70 - 99 mg/dL   Comment 1 Notify RN    COMPREHENSIVE METABOLIC PANEL     Status: Abnormal   Collection Time    04/05/13  5:03 AM      Result Value Range   Sodium 133 (*) 135 - 145 mEq/L   Potassium 4.0  3.5 - 5.1 mEq/L   Chloride 99  96 - 112 mEq/L   CO2 26  19 - 32  mEq/L   Glucose, Bld 133 (*) 70 - 99 mg/dL   BUN 43 (*) 6 - 23 mg/dL   Creatinine, Ser 3.08 (*) 0.50 - 1.35 mg/dL   Calcium 8.3 (*) 8.4 - 10.5 mg/dL   Total Protein 5.3 (*) 6.0 - 8.3 g/dL   Albumin 1.1 (*) 3.5 - 5.2 g/dL   AST 30  0 - 37 U/L   ALT 12  0 - 53 U/L   Alkaline Phosphatase 165 (*) 39 - 117 U/L   Total Bilirubin 1.0  0.3 - 1.2 mg/dL   GFR calc non Af Amer 8 (*) >90 mL/min   GFR calc Af Amer 10 (*) >90 mL/min   Comment: (NOTE)     The eGFR has been calculated using the CKD EPI equation.     This calculation has not been validated in all clinical situations.     eGFR'Chan persistently <90 mL/min signify possible Chronic Kidney  Disease.  CBC     Status: Abnormal   Collection Time    04/05/13  5:03 AM      Result Value Range   WBC 7.5  4.0 - 10.5 K/uL   RBC 2.26 (*) 4.22 - 5.81 MIL/uL   Hemoglobin 6.8 (*) 13.0 - 17.0 g/dL   Comment: RESULT REPEATED AND VERIFIED     CRITICAL VALUE NOTED.  VALUE IS CONSISTENT WITH PREVIOUSLY REPORTED AND CALLED VALUE.   HCT 20.8 (*) 39.0 - 52.0 %   MCV 92.0  78.0 - 100.0 fL   MCH 30.1  26.0 - 34.0 pg   MCHC 32.7  30.0 - 36.0 g/dL   RDW 40.9 (*) 81.1 - 91.4 %   Platelets 133 (*) 150 - 400 K/uL  PROTIME-INR     Status: Abnormal   Collection Time    04/05/13  5:03 AM      Result Value Range   Prothrombin Time 15.9 (*) 11.6 - 15.2 seconds   INR 1.30  0.00 - 1.49    Ct Abdomen Wo Contrast  04/04/2013   CLINICAL DATA:  Right upper quadrant abdominal pain and anemia status post fall today. Question hematoma.  EXAM: CT ABDOMEN WITHOUT CONTRAST  TECHNIQUE: Multidetector CT imaging of the abdomen was performed following the standard protocol without IV contrast.  COMPARISON:  Abdominal pelvic CT 10/27/2012.  FINDINGS: The small chronic right pleural effusion and associated pleural thickening are stable. Adjacent rounded atelectasis in the right lower lobe appears stable. There is a new small left pleural effusion with mild left lower lobe  atelectasis. The heart size is stable. Coronary artery calcifications are noted.  There has been interval development of anasarca with extensive soft tissue edema and ill-defined fluid throughout the subcutaneous fat, greater on the left. This does not demonstrate increased density, although could reflect subcutaneous subacute hemorrhage, or if the patient has been anemic for some time, more recent hemorrhage. There is relatively mild mesenteric edema. Ascites is stable in volume. There is no retroperitoneal hematoma.  The peripherally calcified lesion posteriorly in the right hepatic lobe appears unchanged. The TIPS appears unchanged. The spleen and adrenal glands appear normal. The pancreas is atrophied with multiple parenchymal calcifications. Both kidneys are chronically atrophied. There is no hydronephrosis.  There is diffuse aortoiliac atherosclerosis. The stomach and intra-abdominal portions of the small bowel and colon demonstrate no significant findings. Prominent lymph nodes in the upper abdomen appear unchanged.  There is no evidence of acute fracture. Anterior wedging at T12 appears unchanged. There is chronic disc space loss at L2-3 which appears stable.  The pelvis was not imaged.  IMPRESSION: 1. Anasarca with asymmetric left flank edema/ill-defined fluid. This could reflect subacute hemorrhage or more recent hemorrhage if the patient is anemic. There is no well-defined hematoma. 2. Stable small volume ascites and relatively mild mesenteric edema. 3. Stable chronic right pleural effusion. New small left pleural effusion. 4. Stable incidental abdominal findings including findings of chronic calcific pancreatitis and bilateral renal atrophy. Prior TIPS appears unchanged.   Electronically Signed   By: Roxy Horseman   On: 04/04/2013 20:23   Dg Abd Acute W/chest  04/04/2013   CLINICAL DATA:  Right upper quadrant pain, history dialysis, hypertension, alcoholic liver disease, diabetes mellitus, hepatic  encephalopathy  EXAM: ACUTE ABDOMEN SERIES (ABDOMEN 2 VIEW & CHEST 1 VIEW)  COMPARISON:  Chest radiograph 03/26/2013, abdominal radiographs 02/18/2013  FINDINGS: Mild enlargement of cardiac silhouette.  Mediastinal contours and pulmonary vascularity normal.  Bibasilar  atelectasis with bronchitic and probable underlying emphysematous changes.  Remaining lungs clear.  Small right pleural effusion noted.  TIPS shunt in right upper quadrant.  Examination limited by body habitus.  Probable ascites.  Nonobstructive bowel gas pattern with scattered mildly increased stool throughout colon.  No bowel dilatation, bowel wall thickening or obvious free intraperitoneal air.  Bones demineralized.  IMPRESSION: Probable COPD changes with minimal bibasilar atelectasis and small right pleural effusion.  Probable ascites.  Nonobstructive bowel gas pattern with increased stool throughout colon.   Electronically Signed   By: Ulyses Southward M.D.   On: 04/04/2013 15:14    Review of Systems  Constitutional: Negative for weight loss.  Respiratory: Negative for shortness of breath.   Cardiovascular: Negative for chest pain.  Gastrointestinal: Positive for nausea and abdominal pain. Negative for vomiting and diarrhea.  Neurological: Positive for weakness.   Blood pressure 111/67, pulse 98, temperature 98.4 F (36.9 C), temperature source Oral, resp. rate 18, height 6\' 3"  (1.905 m), weight 113.2 kg (249 lb 9 oz), SpO2 98.00%. Physical Exam  Constitutional: He is oriented to person, place, and time.  Eyes: No scleral icterus.  Neck: No JVD present.  Cardiovascular: Normal rate and regular rhythm.   No murmur heard. Respiratory: He has no wheezes.  GI: He exhibits no distension. There is no tenderness. There is no rebound.  Neurological: He is alert and oriented to person, place, and time.    Assessment/Plan:  problem #1 end-stage renal disease is status post hemodialysis on Tuesday. His BUN is 43 creatinine 6.46 and his  potassium is 4. Patient is due for dialysis today. Problem #2 anemia be secondary to GI bleeding his hemoglobin is 6.8 hematocrit 20.8 remains low. Problem #3 history of diabetes Problem #4 history of chronic liver cirrhosis Problem #5 history of her hepatic encephalopathy. Presently patient is alert and oriented. Problem #6 metabolic bone disease his calcium is was in acceptable range and phosphorus presently is not available. Problem #7 severe malnutrition. Plan: We'll make arrangements for patient to get dialysis today Possibly transfer is patient during dialysis.  We'll check his basic metabolic panel, phosphorus and CBC in the morning.   Timothy Chan 04/05/2013, 8:00 AM

## 2013-04-05 NOTE — Clinical Social Work Psychosocial (Signed)
Clinical Social Work Department BRIEF PSYCHOSOCIAL ASSESSMENT 04/05/2013  Patient:  Timothy Chan, Timothy Chan     Account Number:  [id number]     Admit date:  04/04/2013  Clinical Social Worker:  Nancie Neas  Date/Time:  04/05/2013 08:20 AM  Referred by:  CSW  Date Referred:  04/05/2013 Referred for  SNF Placement   Other Referral:   Interview type:  Patient Other interview type:    PSYCHOSOCIAL DATA Living Status:  FACILITY Admitted from facility:  [facility] Level of care:  Skilled Nursing Facility Primary support name:  Tony/Ray Primary support relationship to patient:  CHILD, ADULT Degree of support available:   supportive per pt    CURRENT CONCERNS Current Concerns  Post-Acute Placement   Other Concerns:    SOCIAL WORK ASSESSMENT / PLAN CSW met with pt at bedside. Pt alert and oriented and very well known to CSW. He has been a resident at [facility] for over a month. He has had frequent admissions to hospital. Pt reports he was rolling over in bed and about fell off, but began having pain. He states he will have dialysis today and receive two units of blood. His usual dialysis days are Monday, Wednesday, and Friday at [facility] in [place]. Pt has an appointment tomorrow morning at [facility]. His son was planning on taking him there and pt would like to keep the appointment. Kelly at [facility] confirms information above and okay for pt to return.   Assessment/plan status:  Psychosocial Support/Ongoing Assessment of Needs Other assessment/ plan:   Information/referral to community resources:   [employer]    PATIENT'S/FAMILY'S RESPONSE TO PLAN OF CARE: Pt hopeful to d/c today after dialysis and receiving blood. CSW will update facility and continue to follow.       Derenda Fennel, [place] [phone]

## 2013-04-05 NOTE — Progress Notes (Signed)
[facility] PROGRESS NOTE  NUNO BRUBACHER XBJ:[id number] DOB: 1950/12/24 DOA: 04/04/2013 PCP: Ignatius Specking., MD  Assessment/Plan  Right upper quadrant abdominal pain:  CT scan demonstrated anasarca with asymmetric left flank edema which may represent hematoma, small volume ascites, stable chronic right pleural effusion, indicentally noticed chronic calcific pancreatitis and bilateral renal atrophy.  TIPS appeared stable.  Patient had trauma to the right abdomen and not to the left flank.  Most likely, this finding represents dependent edema.    Acute blood loss anemia, differential diagnoses includes acute on chronic GI bleeding from GAVE, portal hypertensive gastropathy.  hgb did not increase appropriately with transfusion of 1 unit PRBC.   - Transfuse 2 units of packed red blood cells during HD - Iron studies, B12, folate, TSH pending -  Telemetry:  Sinus tachycardia in the 90s. -  Normally in the setting of GIB due to cirrhosis would recommend antibiotics, however, this patient has had recurrent GIB and generally has ongoing bleeding -  Should this patient be on chronic fluoroquinolone for SBP prophylaxis and due to ongoing GIB?  GAVE with recurrent UGIB  - Continue PPI  - Please minimize the use of carafate which contains aluminum in this ESRD patient  - Spoke with Dr. Si Gaul who will officially consult in AM  - Occult stool positive  End-stage renal disease.  HD today. Patient would like to go home today post HD.   - Appreciate Nephrology recommendations - Continue home phosphorus binders, multivitamin  - Please verify that patient is receiving additional epo and iron with HD.   Alcoholic cirrhosis.  Low salt diet.  No BM so far - Continue increased dose lactulose in the setting of GIB to prevent Hepatic encephalopathy   Constipation, may be contributing to his abdominal pain also.   - Continue increased dose lactulose  With miralax and bisacodyl prn  Severe protein calorie  malnutrition.   -  Low salt diet with supplements   HTN, blood pressures low normal and hypoTN with HD recently  - Consider addition of midodrine prior to HD  - Avoid medications to lower BP   Atypical chest pain, may have anginal pain. Patient has risk factors, including HTN, HLD, hx of tobacco use, hx of DM, and ESRD.  - If recurrent, consider cardiology consult for cath   Hx of DM, last A1c in 4 range a few months ago.   Diet: Low salt per patient request  Access: Fistula, PIV  IVF: OFF  Proph: SCDs   Code Status: full code  Family Communication: spoke with patient alone  Disposition Plan:  Home possibly after HD today or early tomorrow morning if hgb improved and bleeding slowing   Consultants:  Nephrology  Wound care  GI  Procedures:  PRBC x 1 on 9/24  PRBC x 2 on 9/25  CT abd/pelvis 9/24  Antibiotics:  None  HPI/Subjective:  Patient states that he feels somewhat better, although he is tired because he did not sleep well overnight.  Would like to go back to his facility this afternoon post-HD if possible.  No BM, N/V.  Denies chest pain.    Objective: Filed Vitals:   04/04/13 2330 04/05/13 0036 04/05/13 0045 04/05/13 0606  BP: 102/46 114/60 110/62 111/67  Pulse: 100 101 100 98  Temp: 97.4 F (36.3 C) 97.8 F (36.6 C) 97.9 F (36.6 C) 98.4 F (36.9 C)  TempSrc: Oral Oral Oral Oral  Resp: 16 18 18 18   Height:  Weight:    113.2 kg (249 lb 9 oz)  SpO2: 100% 100% 99% 98%    Intake/Output Summary (Last 24 hours) at 04/05/13 1115 Last data filed at 04/05/13 8119  Gross per 24 hour  Intake  457.5 ml  Output      0 ml  Net  457.5 ml   Filed Weights   04/20/13 1730 04/05/13 0606  Weight: 111.4 kg (245 lb 9.5 oz) 113.2 kg (249 lb 9 oz)    Exam:   General:  Cachectic CM, No acute distress  HEENT:  NCAT, MMM  Cardiovascular:  RRR, nl S1, S2 no mrg, 2+ pulses, warm extremities  Respiratory:  CTAB, no increased WOB  Abdomen:   NABS,  soft, TTP in the right upper quadrant without rebound or guarding, mildly distended  MSK:   Normal tone and bulk, Diffuse anasarca with dependent edema  Neuro:  Grossly intact, but sleepier than yesterday  Data Reviewed: Basic Metabolic Panel:  Recent Labs Lab April 20, 2013 1407 04/05/13 0503  NA 131* 133*  K 3.8 4.0  CL 98 99  CO2 26 26  GLUCOSE 201* 133*  BUN 39* 43*  CREATININE 5.98* 6.46*  CALCIUM 8.4 8.3*   Liver Function Tests:  Recent Labs Lab April 20, 2013 1407 04/05/13 0503  AST 31 30  ALT 12 12  ALKPHOS 194* 165*  BILITOT 0.7 1.0  PROT 5.4* 5.3*  ALBUMIN 1.2* 1.1*    Recent Labs Lab 2013/04/20 1407  LIPASE 66*   No results found for this basename: AMMONIA,  in the last 168 hours CBC:  Recent Labs Lab 2013-04-20 1407 04/05/13 0503  WBC 8.0 7.5  NEUTROABS 4.6  --   HGB 6.4* 6.8*  HCT 20.0* 20.8*  MCV 94.8 92.0  PLT 139* 133*   Cardiac Enzymes: No results found for this basename: CKTOTAL, CKMB, CKMBINDEX, TROPONINI,  in the last 168 hours BNP (last 3 results)  Recent Labs  11/21/12 1145  PROBNP 1167.0*   CBG:  Recent Labs Lab 2013-04-20 1211 04-20-2013 1728 2013-04-20 2111 04/05/13 0803  GLUCAP 199* 168* 155* 114*    No results found for this or any previous visit (from the past 240 hour(s)).   Studies: Ct Abdomen Wo Contrast  04-20-2013   CLINICAL DATA:  Right upper quadrant abdominal pain and anemia status post fall today. Question hematoma.  EXAM: CT ABDOMEN WITHOUT CONTRAST  TECHNIQUE: Multidetector CT imaging of the abdomen was performed following the standard protocol without IV contrast.  COMPARISON:  Abdominal pelvic CT 10/27/2012.  FINDINGS: The small chronic right pleural effusion and associated pleural thickening are stable. Adjacent rounded atelectasis in the right lower lobe appears stable. There is a new small left pleural effusion with mild left lower lobe atelectasis. The heart size is stable. Coronary artery calcifications are noted.   There has been interval development of anasarca with extensive soft tissue edema and ill-defined fluid throughout the subcutaneous fat, greater on the left. This does not demonstrate increased density, although could reflect subcutaneous subacute hemorrhage, or if the patient has been anemic for some time, more recent hemorrhage. There is relatively mild mesenteric edema. Ascites is stable in volume. There is no retroperitoneal hematoma.  The peripherally calcified lesion posteriorly in the right hepatic lobe appears unchanged. The TIPS appears unchanged. The spleen and adrenal glands appear normal. The pancreas is atrophied with multiple parenchymal calcifications. Both kidneys are chronically atrophied. There is no hydronephrosis.  There is diffuse aortoiliac atherosclerosis. The stomach and intra-abdominal portions of the  small bowel and colon demonstrate no significant findings. Prominent lymph nodes in the upper abdomen appear unchanged.  There is no evidence of acute fracture. Anterior wedging at T12 appears unchanged. There is chronic disc space loss at L2-3 which appears stable.  The pelvis was not imaged.  IMPRESSION: 1. Anasarca with asymmetric left flank edema/ill-defined fluid. This could reflect subacute hemorrhage or more recent hemorrhage if the patient is anemic. There is no well-defined hematoma. 2. Stable small volume ascites and relatively mild mesenteric edema. 3. Stable chronic right pleural effusion. New small left pleural effusion. 4. Stable incidental abdominal findings including findings of chronic calcific pancreatitis and bilateral renal atrophy. Prior TIPS appears unchanged.   Electronically Signed   By: Roxy Horseman   On: 04/04/2013 20:23   Dg Abd Acute W/chest  04/04/2013   CLINICAL DATA:  Right upper quadrant pain, history dialysis, hypertension, alcoholic liver disease, diabetes mellitus, hepatic encephalopathy  EXAM: ACUTE ABDOMEN SERIES (ABDOMEN 2 VIEW & CHEST 1 VIEW)   COMPARISON:  Chest radiograph 03/26/2013, abdominal radiographs 02/18/2013  FINDINGS: Mild enlargement of cardiac silhouette.  Mediastinal contours and pulmonary vascularity normal.  Bibasilar atelectasis with bronchitic and probable underlying emphysematous changes.  Remaining lungs clear.  Small right pleural effusion noted.  TIPS shunt in right upper quadrant.  Examination limited by body habitus.  Probable ascites.  Nonobstructive bowel gas pattern with scattered mildly increased stool throughout colon.  No bowel dilatation, bowel wall thickening or obvious free intraperitoneal air.  Bones demineralized.  IMPRESSION: Probable COPD changes with minimal bibasilar atelectasis and small right pleural effusion.  Probable ascites.  Nonobstructive bowel gas pattern with increased stool throughout colon.   Electronically Signed   By: Ulyses Southward M.D.   On: 04/04/2013 15:14    Scheduled Meds: . feeding supplement  237 mL Oral BID BM  . ferrous sulfate  325 mg Oral BID WC  . folic acid  1 mg Oral Daily  . Gerhardt's butt cream   Topical BID  . influenza vac split quadrivalent PF  0.5 mL Intramuscular Tomorrow-1000  . insulin aspart  0-9 Units Subcutaneous TID WC  . lactulose  20 g Oral BID  . metoCLOPramide  5 mg Oral TID AC & HS  . pantoprazole  40 mg Oral BID AC  . sevelamer carbonate  1,600 mg Oral TID WC  . sodium chloride  3 mL Intravenous Q12H   Continuous Infusions:   Principal Problem:   Acute blood loss anemia Active Problems:   Alcoholic cirrhosis/remote TIPS   Diabetes mellitus type 2, uncontrolled, with complications   CKD (chronic kidney disease) stage V requiring chronic dialysis   GI bleed   Abdominal pain   Thrombocytopenia, unspecified   GAVE (gastric antral vascular ectasia)   Protein-calorie malnutrition, severe   Chest pain    Time spent: 30 min    Soumya Colson, [facility]  [facility] Pager [phone]. If 7PM-7AM, please contact night-coverage at [email or web address],  password [email or web address] 04/05/2013, 11:15 AM  LOS: 1 day

## 2013-04-05 NOTE — Consult Note (Signed)
WOC consult, contacted nursing unit to perform eLink consultation however pt was leaving to go down for HD and will not be available till after 4pm for evaluation. I have contacted Dr. Malachi Bonds to discuss consult, as it appears patient came in with compression wraps and I wanted to see if the consult was for compression or she needed [place] to evaluate wounds. Dr. Malachi Bonds needs orders placed in EMR for current care being provided.  I have contacted treatment nurse at [facility] and they were using xeroform gauze for weeping and dry boots for compression (kerlix and coban).  I have ordered same.   Discussed POC with patient and bedside nurse.  Re consult if needed, will not follow at this time. Thanks  Maleki Hippe [employer], CWOCN [phone])

## 2013-04-05 NOTE — Progress Notes (Signed)
INITIAL NUTRITION ASSESSMENT  INTERVENTION: Nepro Shake po BID, each supplement provides 425 kcal and 19 grams protein. RD will continue to follow nutrition care  NUTRITION DIAGNOSIS: Malnutrition (severe); ongoing related to end-stage disease and poor nutritional intake.  Goal: Pt to meet >/= 90% of their estimated nutrition needs   Monitor:  Po intake, labs and wt trends  Reason for Assessment: evaluate nutrition status/ requirements  62 y.o. male  Admitting Dx: Acute blood loss anemia  ASSESSMENT:Hx of ETOH abuse, liver disease s/p TIPS, esophogeal varices, ESRD with hemodialysis and poor nutritional status since first assessed in April 2014 progressing to severe malnutrition.  Resident of [facility]. Receives HD MWF @ [facility]. Contacted Renal RD there for additional hx regarding recent nutrition, waiting for her to return my call.  He has severe wt gain currently compared to RD assessment earlier this month. Increase of 23#, 10% < 30 days likely related to fluid status. He is being dialyzed today.  Patient Active Problem List   Diagnosis Date Noted  . Hypokalemia 03/27/2013  . Hyponatremia 03/27/2013  . Chest pain 03/26/2013  . Decubitus ulcer of sacral region, stage 2 03/18/2013  . Protein-calorie malnutrition, severe 03/08/2013  . Malnutrition of moderate degree 02/16/2013  . Upper GI bleeding 02/14/2013  . ESRD on dialysis 02/14/2013  . Anorexia 01/18/2013  . 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 Thrombocytopenia, unspecified 10/31/2012  . SIRS (systemic inflammatory response syndrome) 10/26/2012  . Abdominal pain 10/26/2012  . Peritonitis associated with peritoneal dialysis 10/26/2012  . GI bleed 10/24/2012  . ESRD on peritoneal dialysis 10/20/2012  . Acute blood loss anemia 10/20/2012  . Hyperammonemia 10/20/2012  . Altered mental status 10/20/2012  . Alcoholic cirrhosis/remote TIPS 04/10/2012  . Hepatic encephalopathy 04/10/2012   . Diabetes mellitus type 2, uncontrolled, with complications 04/10/2012  . 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 CKD (chronic kidney disease) stage V requiring chronic dialysis 04/10/2012  . Peripheral neuropathy 04/10/2012    Height: Ht Readings from Last 1 Encounters:  04/04/13 6\' 3"  (1.905 m)    Weight: Wt Readings from Last 1 Encounters:  04/05/13 249 lb 9 oz (113.2 kg)    Ideal Body Weight: 196# (89 kg)  % Ideal Body Weight: 127%  Wt Readings from Last 10 Encounters:  04/05/13 249 lb 9 oz (113.2 kg)  03/27/13 250 lb 9.6 oz (113.671 kg)  03/18/13 227 lb 8.2 oz (103.2 kg)  03/16/13 222 lb 0.1 oz (100.7 kg)  03/16/13 222 lb 0.1 oz (100.7 kg)  03/16/13 222 lb 0.1 oz (100.7 kg)  03/16/13 222 lb 0.1 oz (100.7 kg)  02/27/13 213 lb 9.6 oz (96.888 kg)  01/18/13 210 lb (95.255 kg)  01/15/13 220 lb (99.791 kg)    Usual Body Weight: 220-225#  % Usual Body Weight: 110%  BMI:  Body mass index is 31.19 kg/(m^2). likely skewed due to fluid status  Estimated Nutritional Needs: Kcal: 2600-2800 Protein: 110-120 gr Fluid: 1000 ml plus urine output  Skin: hx of Stage II to sacral area (02/23/13) and currently wound consult pending for bilateral lower extremity venous stasis ulcers.    Diet Order: Sodium Restricted po 25%  EDUCATION NEEDS: -Education needs addressed   Intake/Output Summary (Last 24 hours) at 04/05/13 1046 Last data filed at 04/05/13 0909  Gross per 24 hour  Intake  457.5 ml  Output      0 ml  Net  457.5 ml  Oliguria  Last BM: 04/02/13  Labs:  Recent Labs Lab 04/04/13 1407 04/05/13 0503  NA 131* 133*  K 3.8 4.0  CL 98 99  CO2 26 26  BUN 39* 43*  CREATININE 5.98* 6.46*  CALCIUM 8.4 8.3*  GLUCOSE 201* 133*    CBG (last 3)   Recent Labs  04/04/13 1728 04/04/13 2111 04/05/13 0803  GLUCAP 168* 155* 114*    Scheduled Meds: . feeding supplement  237 mL Oral BID BM  . ferrous sulfate  325 mg Oral BID WC  . folic acid  1 mg Oral  Daily  . Gerhardt's butt cream   Topical BID  . influenza vac split quadrivalent PF  0.5 mL Intramuscular Tomorrow-1000  . insulin aspart  0-9 Units Subcutaneous TID WC  . lactulose  20 g Oral BID  . metoCLOPramide  5 mg Oral TID AC & HS  . pantoprazole  40 mg Oral BID AC  . sevelamer carbonate  1,600 mg Oral TID WC  . sodium chloride  3 mL Intravenous Q12H    Continuous Infusions:   Past Medical History  Diagnosis Date  . Rectal bleeding   . Dialysis care   . GI bleed   . Hypertension   . Esophageal varices   . Alcoholic liver disease   . Hepatic encephalopathy   . Ascites   . Diabetes mellitus   . Detached retina   . Pneumonia   . 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 ESRD on hemodialysis     Past Surgical History  Procedure Laterality Date  . Esophagogastroduodenoscopy  12/31/2010    EGD [facility] THERAPY  . Esophagogastroduodenoscopy  05/31/2012E    GD [facility] ABLATION  . Small bowel givens  12/01/2010  . Colonoscopy  09/07/2010  . Esophagogastroduodenoscopy  09/05/2010    OUTLAW  . Lung surgery  6/11    [place]  . Esophagogastroduodenoscopy  03/26/2011    Procedure: ESOPHAGOGASTRODUODENOSCOPY (EGD);  Surgeon: Malissa Hippo, MD;  [facility];  Service: Endoscopy;  Laterality: N/A;  8:30 am  . Hot hemostasis  03/26/2011    Procedure: HOT HEMOSTASIS (ARGON PLASMA COAGULATION/BICAP);  Surgeon: Malissa Hippo, MD;  Location: [facility];  Service: Endoscopy;  Laterality: N/A;  . Stent in liver    . Esophagogastroduodenoscopy N/A 10/24/2012    Procedure: ESOPHAGOGASTRODUODENOSCOPY (EGD);  Surgeon: Malissa Hippo, MD;  [facility];  Service: Endoscopy;  Laterality: N/A;  . Esophagogastroduodenoscopy N/A 11/22/2012    Procedure: ESOPHAGOGASTRODUODENOSCOPY (EGD);  Surgeon: Charna Elizabeth, MD;  Location: [facility] ENDOSCOPY;  Service: Endoscopy;  Laterality: N/A;  . Paracentesis    . Esophagogastroduodenoscopy N/A 12/21/2012     Procedure: ESOPHAGOGASTRODUODENOSCOPY (EGD);  Surgeon: Malissa Hippo, MD;  [facility];  Service: Endoscopy;  Laterality: N/A;  250-moved to 155 Ann to notify pt  . Hot hemostasis N/A 12/21/2012    Procedure: HOT HEMOSTASIS (ARGON PLASMA COAGULATION/BICAP);  Surgeon: Malissa Hippo, MD;  [facility];  Service: Endoscopy;  Laterality: N/A;  . Esophagogastroduodenoscopy N/A 01/25/2013    Procedure: ESOPHAGOGASTRODUODENOSCOPY (EGD);  Surgeon: Malissa Hippo, MD;  [facility];  Service: Endoscopy;  Laterality: N/A;  100  . Hot hemostasis N/A 01/25/2013    Procedure: HOT HEMOSTASIS (ARGON PLASMA COAGULATION/BICAP);  Surgeon: Malissa Hippo, MD;  [facility];  Service: Endoscopy;  Laterality: N/A;  . Esophagogastroduodenoscopy N/A 02/16/2013    Procedure: ESOPHAGOGASTRODUODENOSCOPY (EGD);  Surgeon: Malissa Hippo, MD;  [facility]  [facility];  Service: Endoscopy;  Laterality: N/A;  . Esophagogastroduodenoscopy N/A 03/07/2013    Procedure: ESOPHAGOGASTRODUODENOSCOPY (EGD);  Surgeon: Malissa Hippo, MD;  [facility];  Service: Endoscopy;  Laterality: N/A;  . Esophagogastroduodenoscopy N/A 03/09/2013    Procedure: ESOPHAGOGASTRODUODENOSCOPY (EGD);  Surgeon: Malissa Hippo, MD;  [facility];  Service: Endoscopy;  Laterality: N/A;    Royann Shivers MS,RD,LDN,CSG Office: [phone] Pager: [phone]

## 2013-04-05 NOTE — Care Management Note (Addendum)
    Page 1 of 1   04/16/2013     2:57:08 PM   CARE MANAGEMENT NOTE 04/16/2013  Patient:  Timothy Chan, Timothy Chan   Account Number:  [id number]  Date Initiated:  04/05/2013  Documentation initiated by:  Sharrie Rothman  Subjective/Objective Assessment:   Pt admitted from [facility] with anemia and flank pain. Pt will return to facility at discharge.     Action/Plan:   CSW to arrange discharge to facility.   Anticipated DC Date:  04/05/2013   Anticipated DC Plan:  SKILLED NURSING FACILITY  In-house referral  Clinical Social Worker      DC Planning Services  CM consult      Choice offered to / List presented to:             Status of service:  Completed, signed off Medicare Important Message given?  YES (If response is "NO", the following Medicare IM given date fields will be blank) Date Medicare IM given:  04/16/2013 Date Additional Medicare IM given:    Discharge Disposition:  SKILLED NURSING FACILITY  Per UR Regulation:    If discussed at Long Length of Stay Meetings, dates discussed:   04/12/2013    Comments:  04/16/13 Rosemary Holms RN BSN CM Pt alert and  Pt aware of his visiter Eulah Citizen. Presented with the IM. Did not want to sign it but stated he knew he had rights to appeal. When asked if he wanted to appeal, he stated no. CSW present.  04/13/13 Rosemary Holms RN BSN CM Having dialysis today. DNR again addressed with pt by MD. Anticipate DC back to SNF this weekend.  04/05/13 1145 Arlyss Queen, RN BSN CM

## 2013-04-05 NOTE — Progress Notes (Signed)
[facility] chart review completed.  

## 2013-04-05 NOTE — Procedures (Signed)
   HEMODIALYSIS TREATMENT NOTE:  3.5 hour heparin-free dialysis completed via left forearm AVF (15g antegrade).  Goal met:  Tolerated removal of 1.4 liters with no interruption in ultrafiltration.  Received 2 units of PRBCs during dialysis.  All blood was reinfused and hemostasis was achieved within 15 minutes.  Report given to Charna Busman, RN.  Iyonna Rish L. Caylan Chenard, RN, CDN

## 2013-04-06 ENCOUNTER — Inpatient Hospital Stay (HOSPITAL_COMMUNITY): Payer: Medicare Other

## 2013-04-06 DIAGNOSIS — K7290 Hepatic failure, unspecified without coma: Secondary | ICD-10-CM

## 2013-04-06 DIAGNOSIS — K7682 Hepatic encephalopathy: Secondary | ICD-10-CM

## 2013-04-06 DIAGNOSIS — R4182 Altered mental status, unspecified: Secondary | ICD-10-CM

## 2013-04-06 LAB — CBC
MCH: 30.4 pg (ref 26.0–34.0)
MCHC: 33.3 g/dL (ref 30.0–36.0)
MCV: 91.3 fL (ref 78.0–100.0)
Platelets: 114 10*3/uL — ABNORMAL LOW (ref 150–400)

## 2013-04-06 LAB — GLUCOSE, CAPILLARY
Glucose-Capillary: 106 mg/dL — ABNORMAL HIGH (ref 70–99)
Glucose-Capillary: 126 mg/dL — ABNORMAL HIGH (ref 70–99)
Glucose-Capillary: 132 mg/dL — ABNORMAL HIGH (ref 70–99)

## 2013-04-06 LAB — BASIC METABOLIC PANEL
BUN: 30 mg/dL — ABNORMAL HIGH (ref 6–23)
CO2: 28 mEq/L (ref 19–32)
Calcium: 8 mg/dL — ABNORMAL LOW (ref 8.4–10.5)
GFR calc Af Amer: 14 mL/min — ABNORMAL LOW (ref >90)
GFR calc non Af Amer: 12 mL/min — ABNORMAL LOW (ref >90)
Glucose, Bld: 116 mg/dL — ABNORMAL HIGH (ref 70–99)
Sodium: 135 mEq/L (ref 135–145)

## 2013-04-06 LAB — FOLATE RBC: RBC Folate: 1533 ng/mL — ABNORMAL HIGH (ref >=366)

## 2013-04-06 LAB — T4, FREE: Free T4: 0.93 ng/dL (ref 0.80–1.80)

## 2013-04-06 LAB — PHOSPHORUS: Phosphorus: 2.5 mg/dL (ref 2.3–4.6)

## 2013-04-06 LAB — AMMONIA: Ammonia: 132 umol/L — ABNORMAL HIGH (ref 11–60)

## 2013-04-06 MED ORDER — DOCUSATE SODIUM 100 MG PO CAPS: 100.0000 mg | ORAL_CAPSULE | Freq: Two times a day (BID) | ORAL | Status: DC

## 2013-04-06 MED ORDER — LACTULOSE ENEMA
300.0000 mL | Freq: Once | ORAL | Status: AC
  Administered 2013-04-06: 300 mL via RECTAL
  Filled 2013-04-06: qty 300

## 2013-04-06 MED ORDER — LEVOTHYROXINE SODIUM 100 MCG PO TABS
100.0000 ug | ORAL_TABLET | Freq: Every day | ORAL | Status: DC
  Administered 2013-04-06 – 2013-04-16 (×10): 100 ug via ORAL
  Filled 2013-04-06 (×11): qty 1

## 2013-04-06 MED ORDER — LACTULOSE 10 GM/15ML PO SOLN
30.0000 g | Freq: Three times a day (TID) | ORAL | Status: DC
  Administered 2013-04-06 – 2013-04-08 (×5): 30 g via ORAL
  Filled 2013-04-06 (×8): qty 60

## 2013-04-06 NOTE — Progress Notes (Signed)
[facility] PROGRESS NOTE  Timothy Chan ZOX:[id number] DOB: 09/28/1950 DOA: 04/04/2013 PCP: Ignatius Specking., MD  Assessment/Plan  Right upper quadrant abdominal pain:  CT scan demonstrated anasarca with asymmetric left flank edema which may represent hematoma, small volume ascites, stable chronic right pleural effusion, indicentally noticed chronic calcific pancreatitis and bilateral renal atrophy.  TIPS appeared stable.  Patient had trauma to the right abdomen and not to the left flank.  Most likely, this finding represents dependent edema.  Attempted paracentesis, however, there was not enough fluid to complete the procedure. He has chronic abdominal pain. Will monitor for now and if pain worsens, recommend reevaluation by Dr. Karilyn Cota or by ER. Consider chronic antibiotic prophylaxis for SBP.  Acute blood loss anemia, differential diagnoses includes acute on chronic GI bleeding from GAVE, portal hypertensive gastropathy. Occult stool positive. Transfused a total of 3 units of PRBC and hgb increased from 6.4 to 8.8mg /dl.  Iron levels have improved, B12 was wnl 03/15/13, folate level pending. TSH was very elevated. Telemetry demonstrated sinus rhythm rate in 90s.  -  F/u folate level.  GAVE with recurrent UGIB  - Continue PPI  - Please minimize the use of carafate which contains aluminum in this ESRD patient  - Spoke with Dr. Si Gaul who will officially consult in AM  - Occult stool positive  Hepatic encephalopathy -  Increase lactulose to 3 times daily and give lactulose enema  End-stage renal disease.  HD today. Patient would like to go home today post HD.   - Appreciate Nephrology recommendations - Continue home phosphorus binders, multivitamin  - Please verify that patient is receiving additional epo and iron with HD.   Alcoholic cirrhosis.  Low salt diet.  No BM so far - Continue increased dose lactulose in the setting of GIB to prevent Hepatic encephalopathy   Constipation, may be  contributing to his abdominal pain also.   - Increase lactulose  Severe protein calorie malnutrition.   -  Low salt diet with supplements   HTN, blood pressures low normal and hypoTN with HD recently  - Consider addition of midodrine prior to HD  - Avoid medications to lower BP   Atypical chest pain, may have anginal pain. Patient has risk factors, including HTN, HLD, hx of tobacco use, hx of DM, and ESRD.  - If recurrent, consider cardiology consult for cath   Hx of DM, last A1c in 4 range a few months ago.   Diet: Low salt per patient request  Access: Fistula, PIV  IVF: OFF  Proph: SCDs   Code Status: full code  Family Communication: spoke with patient alone  Disposition Plan:  I spoke at length with the patient and his son. The patient is too encephalopathic to make any firm medical decisions about his care. I spoke again with the son regarding the possibility of hospice care the patient was amenable to discontinuing his hemodialysis. The son says that he feels that his father has not been living a good quality of life and feels that maybe now is the time to pursue inpatient hospice. The son was planning to speak with his father when his father was more oriented about making this decision. They would like me to meet with the family and with the patient tomorrow to discuss goals of care and to make a determination about where to discharge to.  If he would like to continue hemodialysis, he will return to [facility]. If he wants to pursue comfort measures, we will discontinue  hemodialysis and he will transfer to inpatient hospice.   Consultants:  Nephrology  Wound care  GI  Procedures:  PRBC x 1 on 9/24  PRBC x 2 on 9/25  CT abd/pelvis 9/24  Antibiotics:  None  HPI/Subjective:  Patient is sleepy and confused today. He continues to have severe abdominal pain.  Objective: Filed Vitals:   04/05/13 1520 04/06/13 0200 04/06/13 0553 04/06/13 1400  BP: 104/53 110/61  111/51 120/54  Pulse: 106 102 103 100  Temp: 98.5 F (36.9 C) 98.1 F (36.7 C) 97.9 F (36.6 C) 97.6 F (36.4 C)  TempSrc: Oral Oral Oral Oral  Resp: 20 18 18 18   Height:      Weight:   114.1 kg (251 lb 8.7 oz)   SpO2: 96% 98% 99% 98%    Intake/Output Summary (Last 24 hours) at 04/06/13 1829 Last data filed at 04/05/13 1833  Gross per 24 hour  Intake    200 ml  Output      0 ml  Net    200 ml   Filed Weights   04/05/13 0606 04/05/13 1115 04/06/13 0553  Weight: 113.2 kg (249 lb 9 oz) 113.2 kg (249 lb 9 oz) 114.1 kg (251 lb 8.7 oz)    Exam:   General:  Cachectic CM, No acute distress, sleeping and easily arousable. He falls back asleep quickly. He was confused during my interview and conversation today.  Slow to answer questions.  HEENT:  NCAT, MMM  Cardiovascular:  RRR, nl S1, S2 no mrg, 2+ pulses, warm extremities  Respiratory:  CTAB, no increased WOB  Abdomen:   NABS, soft, TTP diffusely without rebound or guarding, mildly distended  MSK:   Normal tone and bulk, Diffuse anasarca with dependent edema  Neuro:  Very sleepy today.  Data Reviewed: Basic Metabolic Panel:  Recent Labs Lab 04/04/13 1407 04/05/13 0503 04/06/13 0427  NA 131* 133* 135  K 3.8 4.0 4.3  CL 98 99 101  CO2 26 26 28   GLUCOSE 201* 133* 116*  BUN 39* 43* 30*  CREATININE 5.98* 6.46* 4.75*  CALCIUM 8.4 8.3* 8.0*  PHOS  --   --  2.5   Liver Function Tests:  Recent Labs Lab 04/04/13 1407 04/05/13 0503  AST 31 30  ALT 12 12  ALKPHOS 194* 165*  BILITOT 0.7 1.0  PROT 5.4* 5.3*  ALBUMIN 1.2* 1.1*    Recent Labs Lab 04/04/13 1407  LIPASE 66*    Recent Labs Lab 04/06/13 0954  AMMONIA 132*   CBC:  Recent Labs Lab 04/04/13 1407 04/05/13 0503 04/06/13 0427  WBC 8.0 7.5 7.4  NEUTROABS 4.6  --   --   HGB 6.4* 6.8* 8.8*  HCT 20.0* 20.8* 26.4*  MCV 94.8 92.0 91.3  PLT 139* 133* 114*   Cardiac Enzymes: No results found for this basename: CKTOTAL, CKMB, CKMBINDEX,  TROPONINI,  in the last 168 hours BNP (last 3 results)  Recent Labs  11/21/12 1145  PROBNP 1167.0*   CBG:  Recent Labs Lab 04/05/13 1607 04/05/13 2037 04/06/13 0716 04/06/13 1159 04/06/13 1624  GLUCAP 113* 160* 106* 126* 127*    No results found for this or any previous visit (from the past 240 hour(s)).   Studies: US Abdomen Limited  04/06/2013   CLINICAL DATA:  Abdominal pain and distention. Evaluate for ascites.  EXAM: LIMITED ABDOMEN ULTRASOUND FOR ASCITES  TECHNIQUE: Limited ultrasound survey for ascites was performed in all four abdominal quadrants.  COMPARISON:  None.  FINDINGS:  The limited survey evaluation of all 4 abdominal quadrants shows a small amount of ascites in the left upper and lower quadrants.  IMPRESSION: Small amount of ascites in left upper and lower quadrants.   Electronically Signed   By: Myles Rosenthal   On: 04/06/2013 11:18    Scheduled Meds: . feeding supplement  237 mL Oral BID BM  . folic acid  1 mg Oral Daily  . Gerhardt's butt cream   Topical BID  . insulin aspart  0-9 Units Subcutaneous TID WC  . lactulose  30 g Oral TID  . levothyroxine  100 mcg Oral QAC breakfast  . metoCLOPramide  5 mg Oral TID AC & HS  . pantoprazole  40 mg Oral BID AC  . sodium chloride  3 mL Intravenous Q12H   Continuous Infusions:   Principal Problem:   Encephalopathy, hepatic Active Problems:   Alcoholic cirrhosis/remote TIPS   Diabetes mellitus type 2, uncontrolled, with complications   CKD (chronic kidney disease) stage V requiring chronic dialysis   Acute blood loss anemia   GI bleed   Abdominal pain   Thrombocytopenia, unspecified   GAVE (gastric antral vascular ectasia)   Protein-calorie malnutrition, severe   Chest pain    Time spent: 30 min    Jaevion Goto, [facility]  [facility] Pager [phone]. If 7PM-7AM, please contact night-coverage at [email or web address], password [facility] 04/06/2013, 6:29 PM  LOS: 2 days

## 2013-04-06 NOTE — Clinical Social Work Note (Signed)
Pt not ready for d/c today per MD. CSW spoke with Tresa Endo at [facility] who reports facility can accept pt over weekend.  Derenda Fennel, [place] [phone]

## 2013-04-06 NOTE — Progress Notes (Signed)
PT Cancellation Note  Patient Details Name: Timothy Chan MRN: [id number] DOB: 06-Dec-1950   Cancelled Treatment:      Discussed with Nurse Wynona Canes application of Erick Blinks.  She reports she will be comfortable to dress legs with [employer]. If pt requires mobility will see for mobility.    [name] 04/06/2013, 9:06 AM

## 2013-04-06 NOTE — Progress Notes (Signed)
Subjective: Interval History: No nausea or vomiting. His much better today. Objective: Vital signs in last 24 hours: Temp:  [97.9 F (36.6 C)-98.7 F (37.1 C)] 97.9 F (36.6 C) (09/26 0553) Pulse Rate:  [94-115] 103 (09/26 0553) Resp:  [18-20] 18 (09/26 0553) BP: (93-122)/(51-66) 111/51 mmHg (09/26 0553) SpO2:  [96 %-99 %] 99 % (09/26 0553) Weight:  [113.2 kg (249 lb 9 oz)-114.1 kg (251 lb 8.7 oz)] 114.1 kg (251 lb 8.7 oz) (09/26 0553) Weight change: 1.8 kg (3 lb 15.5 oz)  Intake/Output from previous day: 09/25 0701 - 09/26 0700 In: 1170 [P.O.:320; I.V.:150; Blood:700] Out: 1426  Intake/Output this shift:    General appearance: alert, cooperative and no distress Resp: clear to auscultation bilaterally Cardio: regular rate and rhythm, S1, S2 normal, no murmur, click, rub or gallop GI: Distended, nontender Extremities: extremities normal, atraumatic, no cyanosis or edema  Lab Results:  Recent Labs  04/05/13 0503 04/06/13 0427  WBC 7.5 7.4  HGB 6.8* 8.8*  HCT 20.8* 26.4*  PLT 133* 114*   BMET:  Recent Labs  04/05/13 0503 04/06/13 0427  NA 133* 135  K 4.0 4.3  CL 99 101  CO2 26 28  GLUCOSE 133* 116*  BUN 43* 30*  CREATININE 6.46* 4.75*  CALCIUM 8.3* 8.0*   No results found for this basename: PTH,  in the last 72 hours Iron Studies:  Recent Labs  04/05/13 0503  IRON 55  TIBC 124*  TRANSFERRIN <100*  FERRITIN 200    Studies/Results: Ct Abdomen Wo Contrast  04/04/2013   CLINICAL DATA:  Right upper quadrant abdominal pain and anemia status post fall today. Question hematoma.  EXAM: CT ABDOMEN WITHOUT CONTRAST  TECHNIQUE: Multidetector CT imaging of the abdomen was performed following the standard protocol without IV contrast.  COMPARISON:  Abdominal pelvic CT 10/27/2012.  FINDINGS: The small chronic right pleural effusion and associated pleural thickening are stable. Adjacent rounded atelectasis in the right lower lobe appears stable. There is a new small  left pleural effusion with mild left lower lobe atelectasis. The heart size is stable. Coronary artery calcifications are noted.  There has been interval development of anasarca with extensive soft tissue edema and ill-defined fluid throughout the subcutaneous fat, greater on the left. This does not demonstrate increased density, although could reflect subcutaneous subacute hemorrhage, or if the patient has been anemic for some time, more recent hemorrhage. There is relatively mild mesenteric edema. Ascites is stable in volume. There is no retroperitoneal hematoma.  The peripherally calcified lesion posteriorly in the right hepatic lobe appears unchanged. The TIPS appears unchanged. The spleen and adrenal glands appear normal. The pancreas is atrophied with multiple parenchymal calcifications. Both kidneys are chronically atrophied. There is no hydronephrosis.  There is diffuse aortoiliac atherosclerosis. The stomach and intra-abdominal portions of the small bowel and colon demonstrate no significant findings. Prominent lymph nodes in the upper abdomen appear unchanged.  There is no evidence of acute fracture. Anterior wedging at T12 appears unchanged. There is chronic disc space loss at L2-3 which appears stable.  The pelvis was not imaged.  IMPRESSION: 1. Anasarca with asymmetric left flank edema/ill-defined fluid. This could reflect subacute hemorrhage or more recent hemorrhage if the patient is anemic. There is no well-defined hematoma. 2. Stable small volume ascites and relatively mild mesenteric edema. 3. Stable chronic right pleural effusion. New small left pleural effusion. 4. Stable incidental abdominal findings including findings of chronic calcific pancreatitis and bilateral renal atrophy. Prior TIPS appears unchanged.  Electronically Signed   By: Roxy Horseman   On: 04/04/2013 20:23   Dg Abd Acute W/chest  04/04/2013   CLINICAL DATA:  Right upper quadrant pain, history dialysis, hypertension,  alcoholic liver disease, diabetes mellitus, hepatic encephalopathy  EXAM: ACUTE ABDOMEN SERIES (ABDOMEN 2 VIEW & CHEST 1 VIEW)  COMPARISON:  Chest radiograph 03/26/2013, abdominal radiographs 02/18/2013  FINDINGS: Mild enlargement of cardiac silhouette.  Mediastinal contours and pulmonary vascularity normal.  Bibasilar atelectasis with bronchitic and probable underlying emphysematous changes.  Remaining lungs clear.  Small right pleural effusion noted.  TIPS shunt in right upper quadrant.  Examination limited by body habitus.  Probable ascites.  Nonobstructive bowel gas pattern with scattered mildly increased stool throughout colon.  No bowel dilatation, bowel wall thickening or obvious free intraperitoneal air.  Bones demineralized.  IMPRESSION: Probable COPD changes with minimal bibasilar atelectasis and small right pleural effusion.  Probable ascites.  Nonobstructive bowel gas pattern with increased stool throughout colon.   Electronically Signed   By: Ulyses Southward M.D.   On: 04/04/2013 15:14    I have reviewed the patient's current medications.  Assessment/Plan: Problem #1 end-stage as this is status post hemodialysis yesterday his pending creatinine was in acceptable range and normal potassium. Problem #2 anemia presently patient is status post blood transfusion his hemoglobin and hematocrit has improved. Problem #3 history of diabetes Problem #4 history of liver cirrhosis Problem #5 history of hepatic encephalopathy. Patient presently is alert. Problem #6 metabolic bone disease his calcium and phosphorus is was in acceptable range. Problem #7 history of GI bleeding. Plan: We'll make arrangements for patient to get dialysis tomorrow. If is going to be discharged we'll go to his regular units. We'll check his basic metabolic panel and CBC in the morning.     LOS: 2 days   Abner Ardis S 04/06/2013,7:57 AM

## 2013-04-07 ENCOUNTER — Inpatient Hospital Stay (HOSPITAL_COMMUNITY): Payer: Medicare Other

## 2013-04-07 LAB — URINALYSIS, ROUTINE W REFLEX MICROSCOPIC
Bilirubin Urine: NEGATIVE
Glucose, UA: NEGATIVE mg/dL
Nitrite: NEGATIVE
Protein, ur: 100 mg/dL — AB
Specific Gravity, Urine: 1.015 (ref 1.005–1.030)
pH: 7.5 (ref 5.0–8.0)

## 2013-04-07 LAB — GLUCOSE, CAPILLARY: Glucose-Capillary: 129 mg/dL — ABNORMAL HIGH (ref 70–99)

## 2013-04-07 LAB — HEPATIC FUNCTION PANEL
AST: 29 U/L (ref 0–37)
Albumin: 1.2 g/dL — ABNORMAL LOW (ref 3.5–5.2)
Total Bilirubin: 2 mg/dL — ABNORMAL HIGH (ref 0.3–1.2)

## 2013-04-07 LAB — CBC
HCT: 27.4 % — ABNORMAL LOW (ref 39.0–52.0)
MCH: 30.3 pg (ref 26.0–34.0)
MCV: 92.3 fL (ref 78.0–100.0)
RBC: 2.97 MIL/uL — ABNORMAL LOW (ref 4.22–5.81)
WBC: 14.4 10*3/uL — ABNORMAL HIGH (ref 4.0–10.5)

## 2013-04-07 LAB — URINE MICROSCOPIC-ADD ON

## 2013-04-07 LAB — AMMONIA: Ammonia: 52 umol/L (ref 11–60)

## 2013-04-07 LAB — BASIC METABOLIC PANEL
BUN: 39 mg/dL — ABNORMAL HIGH (ref 6–23)
CO2: 25 mEq/L (ref 19–32)
Calcium: 8.2 mg/dL — ABNORMAL LOW (ref 8.4–10.5)
Chloride: 99 mEq/L (ref 96–112)
Creatinine, Ser: 5.39 mg/dL — ABNORMAL HIGH (ref 0.50–1.35)
Glucose, Bld: 144 mg/dL — ABNORMAL HIGH (ref 70–99)

## 2013-04-07 LAB — LIPASE, BLOOD: Lipase: 34 U/L (ref 11–59)

## 2013-04-07 MED ORDER — LACTULOSE ENEMA
300.0000 mL | Freq: Once | ORAL | Status: AC
  Filled 2013-04-07: qty 300

## 2013-04-07 MED ORDER — SODIUM CHLORIDE 0.9 % IV SOLN: 100.0000 mL | INTRAVENOUS | Status: DC | PRN

## 2013-04-07 MED ORDER — ALTEPLASE 2 MG IJ SOLR
2.0000 mg | Freq: Once | INTRAMUSCULAR | Status: AC | PRN
  Filled 2013-04-07: qty 2

## 2013-04-07 MED ORDER — LACTULOSE ENEMA
300.0000 mL | Freq: Two times a day (BID) | ORAL | Status: DC
  Administered 2013-04-07: 300 mL via RECTAL
  Filled 2013-04-07 (×5): qty 300

## 2013-04-07 MED ORDER — DEXTROSE 5 % IV SOLN
2.0000 g | Freq: Three times a day (TID) | INTRAVENOUS | Status: DC
  Administered 2013-04-07 – 2013-04-08 (×3): 2 g via INTRAVENOUS
  Filled 2013-04-07 (×7): qty 2

## 2013-04-07 NOTE — Progress Notes (Addendum)
[facility] PROGRESS NOTE  LIO WEHRLY ZOX:[id number] DOB: 19-Aug-1950 DOA: 04/04/2013 PCP: Ignatius Specking., MD  Assessment/Plan  Abdominal pain:  CT scan demonstrated anasarca with asymmetric left flank edema which may represent hematoma, small volume ascites, stable chronic right pleural effusion, indicentally noticed chronic calcific pancreatitis and bilateral renal atrophy.  TIPS appeared stable.  On 9/26, abdominal pain increased.  Attempted paracentesis, however, there was not enough fluid to complete the procedure.  Monitored clinically overnight and now pain worse today plus he has a new leukocytosis which suggests intraabdominal process, most likely SBP.   -  Repeat LFTs:  Bilirubin up to 2 today.  Repeat in AM and if still rising, repeat abd [place].   -  Lipase:  wnl -  UA -  KUB  -  Start empiric cefotaxime to treat for 5 days and consider prophylaxis after this course complete.    Hepatic encephalopathy, ammonia level 132 down to 50s.   - Continue lactulose TID and repeat lactulose enema x 1  Acute blood loss anemia, differential diagnoses includes acute on chronic GI bleeding from GAVE, portal hypertensive gastropathy. Occult stool positive. Transfused a total of 3 units of PRBC and hgb increased from 6.4 to 8.8mg /dl.   -  Iron levels have improved, B12 was wnl 03/15/13 -  TSH was very elevated.  -  F/u folate level.  GAVE with recurrent UGIB  - Continue PPI  - Please minimize the use of carafate which contains aluminum in this ESRD patient  - Spoke with Dr. Si Gaul who will officially consult in AM  - Occult stool positive  End-stage renal disease.  HD today. Patient would like to go home today post HD.   - Appreciate Nephrology recommendations - Continue home phosphorus binders, multivitamin  - Please verify that patient is receiving additional epo and iron with HD.   Alcoholic cirrhosis.  Low salt diet.   - Continue increased dose lactulose in the setting of GIB to  prevent Hepatic encephalopathy   Constipation, may be contributing to his abdominal pain also, but starting to have BMs  Severe protein calorie malnutrition.   -  Low salt diet with supplements   HTN, blood pressures low normal and hypoTN with HD recently  - Consider addition of midodrine prior to HD  - Avoid medications to lower BP   Atypical chest pain, may have anginal pain. Patient has risk factors, including HTN, HLD, hx of tobacco use, hx of DM, and ESRD.  - If recurrent, consider cardiology consult for cath   Hx of DM, last A1c in 4 range a few months ago.   Diet: Low salt per patient request  Access: Fistula, PIV  IVF: OFF  Proph: SCDs   Code Status: full code  Family Communication: spoke with patient alone  Disposition Plan:  The patient is still too encephalopathic to make any firm medical decisions about his care. I spoke again with the son to update him regarding plan of care.  Once patient more alert, plan to discuss possibility of hospice.    Consultants:  Nephrology  Wound care  GI  Procedures:  PRBC x 1 on 9/24  PRBC x 2 on 9/25  CT abd/pelvis 9/24  Antibiotics:  None  HPI/Subjective:  Patient is sleepy and confused today. He continues to have severe abdominal pain even worse than yesterday.  Objective: Filed Vitals:   04/07/13 0953 04/07/13 1015 04/07/13 1030 04/07/13 1045  BP: 114/59 129/66 126/64 120/63  Pulse: 113 113  114 111  Temp:    98.1 F (36.7 C)  TempSrc:    Oral  Resp:    18  Height:      Weight:      SpO2:    99%    Intake/Output Summary (Last 24 hours) at 04/07/13 1222 Last data filed at 04/07/13 1030  Gross per 24 hour  Intake    300 ml  Output    217 ml  Net     83 ml   Filed Weights   04/06/13 0553 04/07/13 0429 04/07/13 0915  Weight: 114.1 kg (251 lb 8.7 oz) 109.4 kg (241 lb 2.9 oz) 109.4 kg (241 lb 2.9 oz)    Exam:   General:  Cachectic CM, No acute distress, sleeping and difficult to arouse.  Slow to  answer questions.  HEENT:  NCAT, MMM  Cardiovascular:  RRR, nl S1, S2 no mrg, 2+ pulses, warm extremities  Respiratory:  CTAB, no increased WOB  Abdomen:   NABS, soft, TTP diffusely without rebound but possibly with some guarding, moderately distended  MSK:   Normal tone and bulk, Diffuse anasarca with dependent edema  Neuro:  Very sleepy today.  Data Reviewed: Basic Metabolic Panel:  Recent Labs Lab 04/04/13 1407 04/05/13 0503 04/06/13 0427 04/07/13 0550  NA 131* 133* 135 134*  K 3.8 4.0 4.3 4.6  CL 98 99 101 99  CO2 26 26 28 25   GLUCOSE 201* 133* 116* 144*  BUN 39* 43* 30* 39*  CREATININE 5.98* 6.46* 4.75* 5.39*  CALCIUM 8.4 8.3* 8.0* 8.2*  PHOS  --   --  2.5  --    Liver Function Tests:  Recent Labs Lab 04/04/13 1407 04/05/13 0503 04/07/13 0704  AST 31 30 29   ALT 12 12 11   ALKPHOS 194* 165* 144*  BILITOT 0.7 1.0 2.0*  PROT 5.4* 5.3* 5.5*  ALBUMIN 1.2* 1.1* 1.2*    Recent Labs Lab 04/04/13 1407 04/07/13 0704  LIPASE 66* 34    Recent Labs Lab 04/06/13 0954  AMMONIA 132*   CBC:  Recent Labs Lab 04/04/13 1407 04/05/13 0503 04/06/13 0427 04/07/13 0550  WBC 8.0 7.5 7.4 14.4*  NEUTROABS 4.6  --   --   --   HGB 6.4* 6.8* 8.8* 9.0*  HCT 20.0* 20.8* 26.4* 27.4*  MCV 94.8 92.0 91.3 92.3  PLT 139* 133* 114* 130*   Cardiac Enzymes: No results found for this basename: CKTOTAL, CKMB, CKMBINDEX, TROPONINI,  in the last 168 hours BNP (last 3 results)  Recent Labs  11/21/12 1145  PROBNP 1167.0*   CBG:  Recent Labs Lab 04/06/13 1159 04/06/13 1624 04/06/13 2027 04/07/13 0730 04/07/13 1147  GLUCAP 126* 127* 132* 129* 123*    No results found for this or any previous visit (from the past 240 hour(s)).   Studies: US Abdomen Limited  04/06/2013   CLINICAL DATA:  Abdominal pain and distention. Evaluate for ascites.  EXAM: LIMITED ABDOMEN ULTRASOUND FOR ASCITES  TECHNIQUE: Limited ultrasound survey for ascites was performed in all four  abdominal quadrants.  COMPARISON:  None.  FINDINGS: The limited survey evaluation of all 4 abdominal quadrants shows a small amount of ascites in the left upper and lower quadrants.  IMPRESSION: Small amount of ascites in left upper and lower quadrants.   Electronically Signed   By: Myles Rosenthal   On: 04/06/2013 11:18   Dg Chest Port 1 View  04/07/2013   CLINICAL DATA:  Leukocytosis.  EXAM: PORTABLE CHEST - 1 VIEW  COMPARISON:  04/04/2013  FINDINGS: Small left pleural effusion is stable. Cardiomegaly is stable as well as chronic interstitial prominence. No acute infiltrate seen. Pulmonary hyperinflation again noted.  IMPRESSION: Stable cardiomegaly, COPD, and small right pleural effusion.   Electronically Signed   By: Myles Rosenthal   On: 04/07/2013 08:27    Scheduled Meds: . cefoTAXime (CLAFORAN) IV  2 g Intravenous Q8H  . feeding supplement  237 mL Oral BID BM  . folic acid  1 mg Oral Daily  . Gerhardt's butt cream   Topical BID  . insulin aspart  0-9 Units Subcutaneous TID WC  . lactulose  30 g Oral TID  . lactulose  300 mL Rectal BID  . levothyroxine  100 mcg Oral QAC breakfast  . metoCLOPramide  5 mg Oral TID AC & HS  . pantoprazole  40 mg Oral BID AC  . sodium chloride  3 mL Intravenous Q12H   Continuous Infusions:   Principal Problem:   Encephalopathy, hepatic Active Problems:   Alcoholic cirrhosis/remote TIPS   Diabetes mellitus type 2, uncontrolled, with complications   CKD (chronic kidney disease) stage V requiring chronic dialysis   Acute blood loss anemia   GI bleed   Abdominal pain   Thrombocytopenia, unspecified   GAVE (gastric antral vascular ectasia)   Protein-calorie malnutrition, severe   Chest pain    Time spent: 30 min    Stran Raper, [facility]  [facility] Pager [phone]. If 7PM-7AM, please contact night-coverage at [email or web address], password [facility] 04/07/2013, 12:22 PM  LOS: 3 days

## 2013-04-07 NOTE — Progress Notes (Signed)
Subjective: Interval History: No nausea or vomiting. Complains of abdominal pain and poor appetite Objective: Vital signs in last 24 hours: Temp:  [97.6 F (36.4 C)-98.4 F (36.9 C)] 98.2 F (36.8 C) (09/27 0429) Pulse Rate:  [99-113] 113 (09/27 0429) Resp:  [18] 18 (09/27 0429) BP: (113-135)/(54-76) 135/65 mmHg (09/27 0429) SpO2:  [98 %-99 %] 99 % (09/27 0429) Weight:  [109.4 kg (241 lb 2.9 oz)] 109.4 kg (241 lb 2.9 oz) (09/27 0429) Weight change: -3.8 kg (-8 lb 6 oz)  Intake/Output from previous day: 09/26 0701 - 09/27 0700 In: 300 [P.O.:300] Out: -  Intake/Output this shift:    General appearance: alert, cooperative and no distress Resp: clear to auscultation bilaterally Cardio: regular rate and rhythm, S1, S2 normal, no murmur, click, rub or gallop GI: Distended, nontender Extremities: extremities normal, atraumatic, no cyanosis or edema  Lab Results:  Recent Labs  04/06/13 0427 04/07/13 0550  WBC 7.4 14.4*  HGB 8.8* 9.0*  HCT 26.4* 27.4*  PLT 114* 130*   BMET:   Recent Labs  04/06/13 0427 04/07/13 0550  NA 135 134*  K 4.3 4.6  CL 101 99  CO2 28 25  GLUCOSE 116* 144*  BUN 30* 39*  CREATININE 4.75* 5.39*  CALCIUM 8.0* 8.2*   No results found for this basename: PTH,  in the last 72 hours Iron Studies:   Recent Labs  04/05/13 0503  IRON 55  TIBC 124*  TRANSFERRIN <100*  FERRITIN 200    Studies/Results: US Abdomen Limited  04/06/2013   CLINICAL DATA:  Abdominal pain and distention. Evaluate for ascites.  EXAM: LIMITED ABDOMEN ULTRASOUND FOR ASCITES  TECHNIQUE: Limited ultrasound survey for ascites was performed in all four abdominal quadrants.  COMPARISON:  None.  FINDINGS: The limited survey evaluation of all 4 abdominal quadrants shows a small amount of ascites in the left upper and lower quadrants.  IMPRESSION: Small amount of ascites in left upper and lower quadrants.   Electronically Signed   By: Myles Rosenthal   On: 04/06/2013 11:18    I have  reviewed the patient's current medications.  Assessment/Plan: Problem #1 end-stage as this is status post hemodialysis the day before  yesterday his BUN is 39 and  creatinine 5.39  With  normal potassium. Problem #2 anemia presently patient is status post blood transfusion his hemoglobin and hematocrit has improved. Problem #3 history of diabetes Problem #4 history of liver cirrhosis Problem #5 history of hepatic encephalopathy. Patient presently is alert. Problem #6 metabolic bone disease his calcium and phosphorus is was in acceptable range. Problem #7 history of GI bleeding. Plan: We'll make arrangements for patient to get dialysis to[place].  We'll check his basic metabolic panel, phospherus and CBC in the morning.     LOS: 3 days   Hilary Pundt S 04/07/2013,8:19 AM

## 2013-04-07 NOTE — Procedures (Signed)
   HEMODIALYSIS TREATMENT NOTE:  House supervisor reported source water appeared cloudy/discolored after maintenance was done on incoming water pipes 04/06/13.  Water from faucet in dialysis unit appears high in alum, cloudy initially then clear within 1 minute.  Permeate water samples collected after 15 minutes of RO operation were clear.  Results as follows:      Permeate conductivity: 4 us/cm  Feed water conductivity: 235 us/cm  Permeate %rejection: 99  Total chlorine: 0.02 ppm  Voicemail left for Dr. Kristian Covey.  Above WNL readings were later discussed with Dr. Wolfgang Phoenix, and dialysis was initiated.  HD was discontinued per telephone order of Dr. Kristian Covey, pending further testing of source water.    Total HD time:  38 minutes Net UF:  217cc  All blood was reinfused and hemostasis was achieved within 15 minutes.  Report given to Quita Skye, RN.   Laila Myhre L. Patsie Mccardle, RN, CDN

## 2013-04-07 NOTE — Progress Notes (Signed)
In and out cath performed using sterile technique, minimal amt of urine drained.  Specimen sent to lab

## 2013-04-07 NOTE — Progress Notes (Signed)
Spoke with Dr Malachi Bonds regarding placement on [employer].  Order placed yesterday.  MD stated pt would benefit from Unna boots.  Placed unna boots at this time.  Patient has no complaints of discomfort or being too tight

## 2013-04-08 DIAGNOSIS — B9689 Other specified bacterial agents as the cause of diseases classified elsewhere: Secondary | ICD-10-CM

## 2013-04-08 DIAGNOSIS — A4159 Other Gram-negative sepsis: Secondary | ICD-10-CM | POA: Insufficient documentation

## 2013-04-08 DIAGNOSIS — R7881 Bacteremia: Secondary | ICD-10-CM

## 2013-04-08 LAB — TYPE AND SCREEN
ABO/RH(D): B NEG
Antibody Screen: NEGATIVE
Unit division: 0
Unit division: 0
Unit division: 0

## 2013-04-08 LAB — BASIC METABOLIC PANEL
BUN: 42 mg/dL — ABNORMAL HIGH (ref 6–23)
CO2: 28 mEq/L (ref 19–32)
Chloride: 100 mEq/L (ref 96–112)
Creatinine, Ser: 5.47 mg/dL — ABNORMAL HIGH (ref 0.50–1.35)
Glucose, Bld: 120 mg/dL — ABNORMAL HIGH (ref 70–99)
Potassium: 4.5 mEq/L (ref 3.5–5.1)

## 2013-04-08 LAB — CBC
HCT: 25.8 % — ABNORMAL LOW (ref 39.0–52.0)
Hemoglobin: 8.5 g/dL — ABNORMAL LOW (ref 13.0–17.0)
MCH: 30.6 pg (ref 26.0–34.0)
MCHC: 32.9 g/dL (ref 30.0–36.0)
MCV: 92.8 fL (ref 78.0–100.0)
RBC: 2.78 MIL/uL — ABNORMAL LOW (ref 4.22–5.81)
RDW: 18.6 % — ABNORMAL HIGH (ref 11.5–15.5)

## 2013-04-08 LAB — GLUCOSE, CAPILLARY
Glucose-Capillary: 102 mg/dL — ABNORMAL HIGH (ref 70–99)
Glucose-Capillary: 144 mg/dL — ABNORMAL HIGH (ref 70–99)
Glucose-Capillary: 142 mg/dL — ABNORMAL HIGH (ref 70–99)

## 2013-04-08 LAB — HEPATIC FUNCTION PANEL
ALT: 10 U/L (ref 0–53)
Albumin: 1.2 g/dL — ABNORMAL LOW (ref 3.5–5.2)
Alkaline Phosphatase: 150 U/L — ABNORMAL HIGH (ref 39–117)
Bilirubin, Direct: 0.8 mg/dL — ABNORMAL HIGH (ref 0.0–0.3)
Indirect Bilirubin: 0.6 mg/dL (ref 0.3–0.9)
Total Protein: 5.9 g/dL — ABNORMAL LOW (ref 6.0–8.3)

## 2013-04-08 LAB — AMMONIA: Ammonia: 56 umol/L (ref 11–60)

## 2013-04-08 MED ORDER — LACTULOSE ENEMA
300.0000 mL | Freq: Two times a day (BID) | ORAL | Status: DC
  Administered 2013-04-08 – 2013-04-09 (×3): 300 mL via RECTAL
  Filled 2013-04-08 (×3): qty 300

## 2013-04-08 MED ORDER — DEXTROSE 5 % IV SOLN
2.0000 g | INTRAVENOUS | Status: DC
  Administered 2013-04-08 – 2013-04-10 (×3): 2 g via INTRAVENOUS
  Filled 2013-04-08 (×4): qty 2

## 2013-04-08 MED ORDER — VANCOMYCIN HCL IN DEXTROSE 1-5 GM/200ML-% IV SOLN: 1000.0000 mg | INTRAVENOUS | Status: DC

## 2013-04-08 MED ORDER — VANCOMYCIN HCL 10 G IV SOLR
2000.0000 mg | Freq: Once | INTRAVENOUS | Status: AC
  Administered 2013-04-08: 2000 mg via INTRAVENOUS
  Filled 2013-04-08: qty 2000

## 2013-04-08 NOTE — Progress Notes (Signed)
CRITICAL VALUE ALERT  Critical value received:  Blood culture, gram + cocci  Date of notification:  04/08/13  Time of notification:  0320  Critical value read back:yes  Nurse who received alert:  Richardean Chimera  MD notified (1st page):  Dr Sharl Ma  Time of first page:  0337  MD notified (2nd page):  Time of second page:  Responding MD:   Time MD responded:

## 2013-04-08 NOTE — Progress Notes (Addendum)
Attempted on two sperate occassions to have patient take morning medications and eat breakfast.  Pt became agitated and adamantly refused to open his mouth.  States he just wants to be left alone.  Pt's brother in room, aware of pt's refusal.  Pt also refused blood draw this am for blood cultures.

## 2013-04-08 NOTE — Progress Notes (Signed)
Subjective: Interval History: No nausea or vomiting. Complains of abdominal pain and poor appetite. No difficulty in breathing Objective: Vital signs in last 24 hours: Temp:  [98 F (36.7 C)-98.3 F (36.8 C)] 98.3 F (36.8 C) (09/28 0537) Pulse Rate:  [109-114] 109 (09/28 0537) Resp:  [18-20] 18 (09/28 0537) BP: (114-138)/(50-66) 138/57 mmHg (09/28 0537) SpO2:  [97 %-100 %] 100 % (09/28 0537) Weight:  [109.1 kg (240 lb 8.4 oz)-109.4 kg (241 lb 2.9 oz)] 109.1 kg (240 lb 8.4 oz) (09/28 0537) Weight change: 0 kg (0 lb)  Intake/Output from previous day: 09/27 0701 - 09/28 0700 In: 240 [P.O.:240] Out: 248 [Urine:30; Stool:1] Intake/Output this shift:    General appearance: alert, cooperative and no distress Resp: clear to auscultation bilaterally Cardio: regular rate and rhythm, S1, S2 normal, no murmur, click, rub or gallop GI: Distended, nontender Extremities: extremities normal, atraumatic, no cyanosis or edema  Lab Results:  Recent Labs  04/07/13 0550 04/08/13 0536  WBC 14.4* 12.7*  HGB 9.0* 8.5*  HCT 27.4* 25.8*  PLT 130* 125*   BMET:   Recent Labs  04/07/13 0550 04/08/13 0536  NA 134* 134*  K 4.6 4.5  CL 99 100  CO2 25 28  GLUCOSE 144* 120*  BUN 39* 42*  CREATININE 5.39* 5.47*  CALCIUM 8.2* 8.6   No results found for this basename: PTH,  in the last 72 hours Iron Studies:  No results found for this basename: IRON, TIBC, TRANSFERRIN, FERRITIN,  in the last 72 hours  Studies/Results: Dg Abd 1 View  04/07/2013   CLINICAL DATA:  Abdominal pain and distention trauma data is patient, leukocytosis  ABDOMEN - 1 VIEW  COMPARISON:  Portable exam 1251 hr compared to 04/04/2013  FINDINGS: Normal bowel gas pattern.  Tips shunt identified.  No bowel dilatation or bowel wall thickening.  Bones demineralized.  Small right pleural effusion and mild bibasilar opacities.  IMPRESSION: Normal bowel gas pattern.   Electronically Signed   By: Ulyses Southward M.D.   On: 04/07/2013  13:36   US Abdomen Limited  04/06/2013   CLINICAL DATA:  Abdominal pain and distention. Evaluate for ascites.  EXAM: LIMITED ABDOMEN ULTRASOUND FOR ASCITES  TECHNIQUE: Limited ultrasound survey for ascites was performed in all four abdominal quadrants.  COMPARISON:  None.  FINDINGS: The limited survey evaluation of all 4 abdominal quadrants shows a small amount of ascites in the left upper and lower quadrants.  IMPRESSION: Small amount of ascites in left upper and lower quadrants.   Electronically Signed   By: Myles Rosenthal   On: 04/06/2013 11:18   Dg Chest Port 1 View  04/07/2013   CLINICAL DATA:  Leukocytosis.  EXAM: PORTABLE CHEST - 1 VIEW  COMPARISON:  04/04/2013  FINDINGS: Small left pleural effusion is stable. Cardiomegaly is stable as well as chronic interstitial prominence. No acute infiltrate seen. Pulmonary hyperinflation again noted.  IMPRESSION: Stable cardiomegaly, COPD, and small right pleural effusion.   Electronically Signed   By: Myles Rosenthal   On: 04/07/2013 08:27    I have reviewed the patient's current medications.  Assessment/Plan: Problem #1 end-stage as this is status post hemodialysis the day short dialysis yesterday and BUN and creatinine with in acceptable reange  . His potassium is normal Problem #2 anemia presently patient is status post blood transfusion his hemoglobin and hematocrit has improved. Problem #3 history of diabetes. Reasonably controlled Problem #4 history of liver cirrhosis/alcholic Problem #5 history of hepatic encephalopathy. Patient presently is alert.  Problem #6 metabolic bone disease his calcium and phosphorus is was in acceptable range. Presently he of the binder Problem #7 history of GI bleeding.Reqiuring repeated blood transfussion Plan: We'll make arrangements for patient to get dialysis tomorrow  We'll check his basic metabolic panel, phospherus and CBC in the morning.     LOS: 4 days   Timothy Chan S 04/08/2013,8:45 AM

## 2013-04-08 NOTE — Progress Notes (Addendum)
ANTIBIOTIC CONSULT NOTE  Pharmacy Consult for Vancomycin Indication: Gram + Bacteremia  Allergies  Allergen Reactions  . Tylenol [Acetaminophen] Other (See Comments)    cirrhosis    Patient Measurements: Height: 6\' 3"  (190.5 cm) Weight: 240 lb 8.4 oz (109.1 kg) IBW/kg (Calculated) : 84.5  Vital Signs: Temp: 98.3 F (36.8 C) (09/28 0537) Temp src: Oral (09/28 0537) BP: 138/57 mmHg (09/28 0537) Pulse Rate: 109 (09/28 0537) Intake/Output from previous day: 09/27 0701 - 09/28 0700 In: 240 [P.O.:240] Out: 248 [Urine:30; Stool:1] Intake/Output from this shift:    Labs:  Recent Labs  04/06/13 0427 04/07/13 0550 04/08/13 0536  WBC 7.4 14.4* 12.7*  HGB 8.8* 9.0* 8.5*  PLT 114* 130* 125*  CREATININE 4.75* 5.39* 5.47*   Estimated Creatinine Clearance: 18.9 ml/min (by C-G formula based on Cr of 5.47). No results found for this basename: VANCOTROUGH, Leodis Binet, VANCORANDOM, GENTTROUGH, GENTPEAK, GENTRANDOM, TOBRATROUGH, TOBRAPEAK, TOBRARND, AMIKACINPEAK, AMIKACINTROU, AMIKACIN,  in the last 72 hours   Microbiology: Recent Results (from the past 720 hour(s))  CULTURE, BODY FLUID-BOTTLE     Status: None   Collection Time    03/09/13  2:07 PM      Result Value Range Status   Specimen Description FLUID ASCITIC COLLECTED BY DOCTOR BOLES   Final   Special Requests BOTTLES DRAWN AEROBIC AND ANAEROBIC 10CC   Final   Culture NO GROWTH 5 DAYS   Final   Report Status 03/14/2013 FINAL   Final  GRAM STAIN     Status: None   Collection Time    03/09/13  2:12 PM      Result Value Range Status   Specimen Description ASCITIC   Final   Special Requests NONE   Final   Gram Stain     Final   Value: NO ORGANISMS SEEN     FEW WBC SEEN   Report Status 03/09/2013 FINAL   Final  MRSA PCR SCREENING     Status: Abnormal   Collection Time    03/19/13  3:00 AM      Result Value Range Status   MRSA by PCR POSITIVE (*) NEGATIVE Final   Comment:            The GeneXpert MRSA Assay (FDA   approved for NASAL specimens     only), is one component of a     comprehensive MRSA colonization     surveillance program. It is not     intended to diagnose MRSA     infection nor to guide or     monitor treatment for     MRSA infections.     RESULT CALLED TO, READ BACK BY AND VERIFIED WITH:     NEILSON,T @0530  ON 03/19/13 BY WOODIE,J  CULTURE, BLOOD (ROUTINE X 2)     Status: None   Collection Time    04/07/13 12:47 PM      Result Value Range Status   Specimen Description BLOOD BLOOD RIGHT HAND BOTTLES DRAWN AEROBIC ONLY   Final   Special Requests 10CC   Final   Culture     Final   Value: GRAM POSITIVE COCCI     Gram Stain Report Called to,Read Back By and Verified With: MARTIN,M @ 0320 ON 04/08/13 BY WOODIE,J     GS DONE @ [facility]   Report Status PENDING   Incomplete  CULTURE, BLOOD (ROUTINE X 2)     Status: None   Collection Time    04/07/13  1:02 PM  Result Value Range Status   Specimen Description BLOOD BLOOD RIGHT HAND BOTTLES DRAWN AEROBIC ONLY   Final   Special Requests 10CC   Final   Culture     Final   Value: GRAM POSITIVE COCCI     Gram Stain Report Called to,Read Back By and Verified With: MARTIN,M @ 0320 ON 04/08/13 BY WOODIE,J     GS DONE @ [facility]   Report Status PENDING   Incomplete    Medical History: Past Medical History  Diagnosis Date  . Rectal bleeding   . Dialysis care   . GI bleed   . Hypertension   . Esophageal varices   . Alcoholic liver disease   . Hepatic encephalopathy   . Ascites   . Diabetes mellitus   . Detached retina   . Pneumonia   . 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 ESRD on hemodialysis    Medications:  Scheduled:  . cefoTAXime (CLAFORAN) IV  2 g Intravenous Q8H  . feeding supplement  237 mL Oral BID BM  . folic acid  1 mg Oral Daily  . Gerhardt's butt cream   Topical BID  . insulin aspart  0-9 Units Subcutaneous TID WC  . lactulose  30 g Oral TID  . levothyroxine  100 mcg Oral QAC breakfast  .  metoCLOPramide  5 mg Oral TID AC & HS  . pantoprazole  40 mg Oral BID AC  . sodium chloride  3 mL Intravenous Q12H   Assessment: Okay for Protocol ESRD currently scheduled every T-TH-Sat  Cefotaxime 9/27 >> Vancomycin 9/28 >>  Goal of Therapy:  Pre-Hemodialysis Vancomycin level goal range =15-25 mcg/ml  Plan:  Vancomycin 2000mg  IV x 1, then 1000mg  every HD. Adjust Cefotaxime to 2000mg  IV every 24 hours due to ESRD. Measure antibiotic drug levels at steady state Follow up culture results  Timothy Chan 04/08/2013,7:26 AM

## 2013-04-08 NOTE — Progress Notes (Signed)
[facility] PROGRESS NOTE  Timothy Chan EAV:[id number] DOB: 1951/06/05 DOA: 04/04/2013 PCP: Ignatius Specking., MD  Assessment/Plan   Gram positive cocci bacteremia:  This likely explains patient's leukocytosis.  Unclear source. -  Repeat blood cultures ASAP  -  Vancomycin  -  F/u sensitivities and speciation -  ECHO  Abdominal pain:  CT scan demonstrated anasarca with asymmetric left flank edema which may represent hematoma, small volume ascites, stable chronic right pleural effusion, indicentally noticed chronic calcific pancreatitis and bilateral renal atrophy.  TIPS appeared stable.  On 9/26, abdominal pain increased.  Attempted paracentesis, however, there was not enough fluid to complete the procedure. Tx empirically for SBP -  Add on LFTs -  UA neg -  KUB unremarkable -  cefotaxime day 2/5  Hepatic encephalopathy, ammonia level 132 down to 50s but still somnolent and apathetic.  (may be confounded by sepsis) - Continue lactulose TID - patient refusing  - Start lactulose enemas BID and continue to encourage oral lactulose  Acute blood loss anemia, differential diagnoses includes acute on chronic GI bleeding from GAVE, portal hypertensive gastropathy. Occult stool positive. Transfused a total of 3 units of PRBC and hgb increased from 6.4 to 8.8mg /dl.   -  Iron levels have improved, B12 was wnl 03/15/13 -  TSH was very elevated.  -  F/u folate level.  GAVE with recurrent UGIB  - Continue PPI  - Please minimize the use of carafate which contains aluminum in this ESRD patient  - Occult stool positive  End-stage renal disease.  HD today. Patient would like to go home today post HD.   - Appreciate Nephrology recommendations - Continue home phosphorus binders, multivitamin  - Continue epo and iron with HD.   Alcoholic cirrhosis.  Low salt diet.  Fluid balance managed with HD.  Constipation, may be contributing to his abdominal pain also, but starting to have BMs  Severe  protein calorie malnutrition.   -  Low salt diet with supplements   HTN, blood pressures low normal and hypoTN with HD recently  - Consider addition of midodrine prior to HD  - Avoid medications to lower BP   Atypical chest pain, may have anginal pain. Patient has risk factors, including HTN, HLD, hx of tobacco use, hx of DM, and ESRD.  - If recurrent, consider cardiology consult for cath   Hx of DM, last A1c in 4 range a few months ago.   Leukocytosis:  CXR neg.  UA neg.  May be secondary to SBP or bacteremia.  Trending down with antibiotics  Diet: Low salt per patient request  Access: Fistula, PIV  IVF: OFF  Proph: SCDs   Code Status: full code  Family Communication: spoke with patient alone  Disposition Plan:  The patient is still too encephalopathic to make any firm medical decisions about his care. I spoke again with the son to update him regarding plan of care.  Once patient more alert, plan to discuss possibility of hospice.    Consultants:  Nephrology  Wound care  GI  Procedures:  PRBC x 1 on 9/24  PRBC x 2 on 9/25  CT abd/pelvis 9/24  Antibiotics:  None  HPI/Subjective:  Patient is still sleepy and refusing to answer my questions today. States abdominal pain is about the same today.    Objective: Filed Vitals:   04/07/13 1045 04/07/13 1351 04/07/13 2122 04/08/13 0537  BP: 120/63 125/62 118/50 138/57  Pulse: 111 114 111 109  Temp: 98.1 F (36.7  C) 98.2 F (36.8 C) 98 F (36.7 C) 98.3 F (36.8 C)  TempSrc: Oral  Oral Oral  Resp: 18 18 18 18   Height:      Weight:    109.1 kg (240 lb 8.4 oz)  SpO2: 99% 99% 97% 100%    Intake/Output Summary (Last 24 hours) at 04/08/13 1114 Last data filed at 04/08/13 1056  Gross per 24 hour  Intake    620 ml  Output     31 ml  Net    589 ml   Filed Weights   04/07/13 0429 04/07/13 0915 04/08/13 0537  Weight: 109.4 kg (241 lb 2.9 oz) 109.4 kg (241 lb 2.9 oz) 109.1 kg (240 lb 8.4 oz)    Exam:   General:   Cachectic CM, No acute distress, sleeping and difficult to arouse.  Refuses to answer questions  HEENT:  NCAT, MMM  Cardiovascular:  RRR, nl S1, S2 no mrg, 2+ pulses, warm extremities  Respiratory:  CTAB, no increased WOB  Abdomen:   NABS, soft, TTP diffusely without rebound but possibly with some guarding, moderately distended  MSK:   Normal tone and bulk, Diffuse anasarca with dependent edema  Neuro:  Very sleepy today.  Data Reviewed: Basic Metabolic Panel:  Recent Labs Lab 04/04/13 1407 04/05/13 0503 04/06/13 0427 04/07/13 0550 04/08/13 0536  NA 131* 133* 135 134* 134*  K 3.8 4.0 4.3 4.6 4.5  CL 98 99 101 99 100  CO2 26 26 28 25 28   GLUCOSE 201* 133* 116* 144* 120*  BUN 39* 43* 30* 39* 42*  CREATININE 5.98* 6.46* 4.75* 5.39* 5.47*  CALCIUM 8.4 8.3* 8.0* 8.2* 8.6  PHOS  --   --  2.5  --   --    Liver Function Tests:  Recent Labs Lab 04/04/13 1407 04/05/13 0503 04/07/13 0704  AST 31 30 29   ALT 12 12 11   ALKPHOS 194* 165* 144*  BILITOT 0.7 1.0 2.0*  PROT 5.4* 5.3* 5.5*  ALBUMIN 1.2* 1.1* 1.2*    Recent Labs Lab 04/04/13 1407 04/07/13 0704  LIPASE 66* 34    Recent Labs Lab 04/06/13 0954 04/07/13 1247  AMMONIA 132* 52   CBC:  Recent Labs Lab 04/04/13 1407 04/05/13 0503 04/06/13 0427 04/07/13 0550 04/08/13 0536  WBC 8.0 7.5 7.4 14.4* 12.7*  NEUTROABS 4.6  --   --   --   --   HGB 6.4* 6.8* 8.8* 9.0* 8.5*  HCT 20.0* 20.8* 26.4* 27.4* 25.8*  MCV 94.8 92.0 91.3 92.3 92.8  PLT 139* 133* 114* 130* 125*   Cardiac Enzymes: No results found for this basename: CKTOTAL, CKMB, CKMBINDEX, TROPONINI,  in the last 168 hours BNP (last 3 results)  Recent Labs  11/21/12 1145  PROBNP 1167.0*   CBG:  Recent Labs Lab 04/07/13 0730 04/07/13 1147 04/07/13 1650 04/07/13 2121 04/08/13 0735  GLUCAP 129* 123* 151* 128* 102*    Recent Results (from the past 240 hour(s))  CULTURE, BLOOD (ROUTINE X 2)     Status: None   Collection Time    04/07/13  12:47 PM      Result Value Range Status   Specimen Description BLOOD BLOOD RIGHT HAND BOTTLES DRAWN AEROBIC ONLY   Final   Special Requests 10CC   Final   Culture     Final   Value: GRAM POSITIVE COCCI     Gram Stain Report Called to,Read Back By and Verified With: MARTIN,M @ 0320 ON 04/08/13 BY Anner Crete  GS DONE @ [facility]   Report Status PENDING   Incomplete  CULTURE, BLOOD (ROUTINE X 2)     Status: None   Collection Time    04/07/13  1:02 PM      Result Value Range Status   Specimen Description BLOOD BLOOD RIGHT HAND BOTTLES DRAWN AEROBIC ONLY   Final   Special Requests 10CC   Final   Culture     Final   Value: GRAM POSITIVE COCCI     Gram Stain Report Called to,Read Back By and Verified With: MARTIN,M @ 0320 ON 04/08/13 BY WOODIE,J     GS DONE @ [facility]   Report Status PENDING   Incomplete     Studies: Dg Abd 1 View  04/07/2013   CLINICAL DATA:  Abdominal pain and distention trauma data is patient, leukocytosis  ABDOMEN - 1 VIEW  COMPARISON:  Portable exam 1251 hr compared to 04/04/2013  FINDINGS: Normal bowel gas pattern.  Tips shunt identified.  No bowel dilatation or bowel wall thickening.  Bones demineralized.  Small right pleural effusion and mild bibasilar opacities.  IMPRESSION: Normal bowel gas pattern.   Electronically Signed   By: Ulyses Southward M.D.   On: 04/07/2013 13:36   Dg Chest Port 1 View  04/07/2013   CLINICAL DATA:  Leukocytosis.  EXAM: PORTABLE CHEST - 1 VIEW  COMPARISON:  04/04/2013  FINDINGS: Small left pleural effusion is stable. Cardiomegaly is stable as well as chronic interstitial prominence. No acute infiltrate seen. Pulmonary hyperinflation again noted.  IMPRESSION: Stable cardiomegaly, COPD, and small right pleural effusion.   Electronically Signed   By: Myles Rosenthal   On: 04/07/2013 08:27    Scheduled Meds: . cefoTAXime (CLAFORAN) IV  2 g Intravenous Q24H  . feeding supplement  237 mL Oral BID BM  . folic acid  1 mg Oral Daily  . Gerhardt's butt cream   Topical  BID  . insulin aspart  0-9 Units Subcutaneous TID WC  . lactulose  30 g Oral TID  . levothyroxine  100 mcg Oral QAC breakfast  . metoCLOPramide  5 mg Oral TID AC & HS  . pantoprazole  40 mg Oral BID AC  . sodium chloride  3 mL Intravenous Q12H  . [START ON 04/10/2013] vancomycin  1,000 mg Intravenous Q T,Th,Sa-HD   Continuous Infusions:   Principal Problem:   Encephalopathy, hepatic Active Problems:   Alcoholic cirrhosis/remote TIPS   Diabetes mellitus type 2, uncontrolled, with complications   CKD (chronic kidney disease) stage V requiring chronic dialysis   Acute blood loss anemia   GI bleed   Abdominal pain   Thrombocytopenia, unspecified   GAVE (gastric antral vascular ectasia)   Protein-calorie malnutrition, severe   Chest pain    Time spent: 30 min    Navdeep Halt  [facility] Pager [phone]. If 7PM-7AM, please contact night-coverage at [email or web address], password [facility] 04/08/2013, 11:14 AM  LOS: 4 days

## 2013-04-09 LAB — BASIC METABOLIC PANEL
CO2: 27 mEq/L (ref 19–32)
Calcium: 8.7 mg/dL (ref 8.4–10.5)
Chloride: 104 mEq/L (ref 96–112)
GFR calc Af Amer: 10 mL/min — ABNORMAL LOW (ref >90)
Potassium: 4.4 mEq/L (ref 3.5–5.1)
Sodium: 139 mEq/L (ref 135–145)

## 2013-04-09 LAB — CBC
HCT: 25.5 % — ABNORMAL LOW (ref 39.0–52.0)
MCH: 30.3 pg (ref 26.0–34.0)
MCHC: 32.5 g/dL (ref 30.0–36.0)
Platelets: 124 10*3/uL — ABNORMAL LOW (ref 150–400)
RBC: 2.74 MIL/uL — ABNORMAL LOW (ref 4.22–5.81)
RDW: 18.9 % — ABNORMAL HIGH (ref 11.5–15.5)
WBC: 8.6 10*3/uL (ref 4.0–10.5)

## 2013-04-09 LAB — GLUCOSE, CAPILLARY
Glucose-Capillary: 99 mg/dL (ref 70–99)
Glucose-Capillary: 104 mg/dL — ABNORMAL HIGH (ref 70–99)
Glucose-Capillary: 93 mg/dL (ref 70–99)
Glucose-Capillary: 160 mg/dL — ABNORMAL HIGH (ref 70–99)

## 2013-04-09 MED ORDER — ALTEPLASE 2 MG IJ SOLR
2.0000 mg | Freq: Once | INTRAMUSCULAR | Status: DC | PRN
  Filled 2013-04-09: qty 2

## 2013-04-09 MED ORDER — SODIUM CHLORIDE 0.9 % IV SOLN: 100.0000 mL | INTRAVENOUS | Status: DC | PRN

## 2013-04-09 MED ORDER — VANCOMYCIN HCL IN DEXTROSE 1-5 GM/200ML-% IV SOLN
1000.0000 mg | INTRAVENOUS | Status: DC
  Administered 2013-04-09 (×2): 1000 mg via INTRAVENOUS
  Filled 2013-04-09: qty 200

## 2013-04-09 MED ORDER — LACTULOSE 10 GM/15ML PO SOLN
30.0000 g | ORAL | Status: DC
  Administered 2013-04-09 – 2013-04-10 (×5): 30 g via ORAL
  Filled 2013-04-09 (×6): qty 60

## 2013-04-09 MED ORDER — OXYCODONE HCL 5 MG PO TABS
5.0000 mg | ORAL_TABLET | Freq: Four times a day (QID) | ORAL | Status: DC | PRN
  Administered 2013-04-09 – 2013-04-16 (×11): 5 mg via ORAL
  Filled 2013-04-09 (×13): qty 1

## 2013-04-09 NOTE — Procedures (Signed)
   HEMODIALYSIS TREATMENT NOTE:  3.5 hour heparin-free dialysis completed via left forearm AVF (15g/antegrade).  Goal met:  Tolerated removal of 1.1 liters with no interruption in ultrafiltration.  All blood was reinfused and hemostasis was achieved within 15 minutes.  Report given to Ian Malkin, RN.  Dayvian Blixt L. Adrine Hayworth, RN, CDN

## 2013-04-09 NOTE — Progress Notes (Signed)
ANTIBIOTIC CONSULT NOTE  Pharmacy Consult for Vancomycin Indication: Gram + Bacteremia  Allergies  Allergen Reactions  . Tylenol [Acetaminophen] Other (See Comments)    cirrhosis   Patient Measurements: Height: 6\' 3"  (190.5 cm) Weight: 240 lb 8.4 oz (109.1 kg) IBW/kg (Calculated) : 84.5  Vital Signs: Temp: 98.1 F (36.7 C) (09/29 0700) Temp src: Oral (09/29 0700) BP: 119/65 mmHg (09/29 0700) Pulse Rate: 98 (09/29 0700) Intake/Output from previous day: 09/28 0701 - 09/29 0700 In: 650 [P.O.:100; IV Piggyback:550] Out: -  Intake/Output from this shift:    Labs:  Recent Labs  04/07/13 0550 04/08/13 0536 04/09/13 0506  WBC 14.4* 12.7* 8.6  HGB 9.0* 8.5* 8.3*  PLT 130* 125* 124*  CREATININE 5.39* 5.47* 6.08*   Estimated Creatinine Clearance: 17 ml/min (by C-G formula based on Cr of 6.08). No results found for this basename: VANCOTROUGH, Leodis Binet, VANCORANDOM, GENTTROUGH, GENTPEAK, GENTRANDOM, TOBRATROUGH, TOBRAPEAK, TOBRARND, AMIKACINPEAK, AMIKACINTROU, AMIKACIN,  in the last 72 hours   Microbiology: Recent Results (from the past 720 hour(s))  MRSA PCR SCREENING     Status: Abnormal   Collection Time    03/19/13  3:00 AM      Result Value Range Status   MRSA by PCR POSITIVE (*) NEGATIVE Final   Comment:            The GeneXpert MRSA Assay (FDA     approved for NASAL specimens     only), is one component of a     comprehensive MRSA colonization     surveillance program. It is not     intended to diagnose MRSA     infection nor to guide or     monitor treatment for     MRSA infections.     RESULT CALLED TO, READ BACK BY AND VERIFIED WITH:     NEILSON,T @0530  ON 03/19/13 BY WOODIE,J  CULTURE, BLOOD (ROUTINE X 2)     Status: None   Collection Time    04/07/13 12:47 PM      Result Value Range Status   Specimen Description BLOOD BLOOD RIGHT HAND BOTTLES DRAWN AEROBIC ONLY   Final   Special Requests 10CC   Final   Culture     Final   Value: GRAM POSITIVE COCCI      Gram Stain Report Called to,Read Back By and Verified With: MARTIN,M @ 0320 ON 04/08/13 BY WOODIE,J     GS DONE @ [facility]   Report Status PENDING   Incomplete  CULTURE, BLOOD (ROUTINE X 2)     Status: None   Collection Time    04/07/13  1:02 PM      Result Value Range Status   Specimen Description BLOOD BLOOD RIGHT HAND BOTTLES DRAWN AEROBIC ONLY   Final   Special Requests 10CC   Final   Culture     Final   Value: GRAM POSITIVE COCCI     Gram Stain Report Called to,Read Back By and Verified With: MARTIN,M @ 0320 ON 04/08/13 BY WOODIE,J     GS DONE @ [facility]   Report Status PENDING   Incomplete  CULTURE, BLOOD (ROUTINE X 2)     Status: None   Collection Time    04/08/13 12:04 PM      Result Value Range Status   Specimen Description     Final   Value: BLOOD BLOOD RIGHT HAND BOTTLES DRAWN AEROBIC AND ANAEROBIC   Special Requests [facility]   Final   Culture PENDING  Incomplete   Report Status PENDING   Incomplete  CULTURE, BLOOD (ROUTINE X 2)     Status: None   Collection Time    04/08/13  4:27 PM      Result Value Range Status   Specimen Description BLOOD RIGHT HAND   Final   Special Requests BOTTLES DRAWN AEROBIC ONLY 6CC BOTTLE   Final   Culture PENDING   Incomplete   Report Status PENDING   Incomplete   Medical History: Past Medical History  Diagnosis Date  . Rectal bleeding   . Dialysis care   . GI bleed   . Hypertension   . Esophageal varices   . Alcoholic liver disease   . Hepatic encephalopathy   . Ascites   . Diabetes mellitus   . Detached retina   . Pneumonia   . 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 ESRD on hemodialysis    Medications:  Scheduled:  . cefoTAXime (CLAFORAN) IV  2 g Intravenous Q24H  . feeding supplement  237 mL Oral BID BM  . folic acid  1 mg Oral Daily  . Gerhardt's butt cream   Topical BID  . insulin aspart  0-9 Units Subcutaneous TID WC  . lactulose  30 g Oral Q3H  . levothyroxine  100 mcg Oral QAC breakfast  .  metoCLOPramide  5 mg Oral TID AC & HS  . pantoprazole  40 mg Oral BID AC  . sodium chloride  3 mL Intravenous Q12H  . vancomycin  1,000 mg Intravenous Q M,W,F-HD   Assessment: Okay for Protocol ESRD: Dialysis previously scheduled every T-TH-Sat Per Dr Kristian Covey pt will get dialysis today (Monday) so will change Vancomycin schedule to MWF with dialysis.  Cefotaxime 9/27 >> Vancomycin 9/28 >>  Goal of Therapy:  Pre-Hemodialysis Vancomycin level goal range =15-25 mcg/ml  Plan:  Vancomycin 1000mg  every HD (currently MWF) Continue Cefotaxime 2000mg  IV every 24 hours (renally adjusted) Measure antibiotic drug levels at steady state Follow up culture results  Valrie Hart A 04/09/2013,9:31 AM

## 2013-04-09 NOTE — Progress Notes (Signed)
[facility] PROGRESS NOTE  EDGERRIN CORREIA UJW:[id number] DOB: [birth year] DOA: 04/04/2013 PCP: Ignatius Specking., MD  Assessment/Plan  Gram positive cocci bacteremia:  This likely explains patient's leukocytosis.  Unclear source. -  F/u blood cultures from 9/28 -  F/u sensitivities and speciation -  Continue empiric Vancomycin  -  ECHO pending  Abdominal pain:  CT scan demonstrated anasarca with asymmetric left flank edema which may represent hematoma, small volume ascites, stable chronic right pleural effusion, indicentally noticed chronic calcific pancreatitis and bilateral renal atrophy.  TIPS appeared stable.  On 9/26, abdominal pain increased.  Attempted paracentesis, however, there was not enough fluid to complete the procedure. Tx empirically for SBP.  Pain improving.   -  LFTs stable to improved. -  UA neg -  KUB unremarkable -  cefotaxime day 3/5  Hepatic encephalopathy, ammonia level 132 down to 50s and starting to wake up a little.   - Increase lactulose today to try to maximize effect and really improve mentation - d/c lactulose enemas as patient more alert today  Acute blood loss anemia, differential diagnoses includes acute on chronic GI bleeding from GAVE, portal hypertensive gastropathy. Occult stool positive. Transfused a total of 3 units of PRBC and hgb increased from 6.4 to 8.8mg /dl.   -  Iron levels have improved, B12 was wnl 03/15/13 -  TSH was very elevated.  -  Folate high.  GAVE with recurrent UGIB  - Continue PPI  - Please minimize the use of carafate which contains aluminum in this ESRD patient  - Occult stool positive  End-stage renal disease.  HD today. Patient would like to go home today post HD.   - Appreciate Nephrology recommendations - Continue home phosphorus binders, multivitamin  - Continue epo and iron with HD.   Alcoholic cirrhosis.  Low salt diet.  Fluid balance managed with HD.  Constipation, may be contributing to his abdominal pain also,  but starting to have BMs  Severe protein calorie malnutrition.   -  Low salt diet with supplements   HTN, blood pressures low normal and hypoTN with HD recently  - Consider addition of midodrine prior to HD  - Avoid medications to lower BP   Atypical chest pain, may have anginal pain. Patient has risk factors, including HTN, HLD, hx of tobacco use, hx of DM, and ESRD.  - If recurrent, consider cardiology consult for cath    Hx of DM, last A1c in 4 range a few months ago.   Leukocytosis:  CXR neg.  UA neg.  May be secondary to SBP or bacteremia. Resolved.    Thrombocytopenia, plt stable.  Secondary to cirrhosis.  Diet: Low salt per patient request  Access: Fistula, PIV  IVF: OFF  Proph: SCDs   Code Status: full code  Family Communication: spoke with patient alone  Disposition Plan:  Once patient more alert, plan to discuss possibility of hospice.   Still awaiting work up for bacteremia.    Consultants:  Nephrology  Wound care  GI  Procedures:  PRBC x 1 on 9/24  PRBC x 2 on 9/25  CT abd/pelvis 9/24  Antibiotics:  None  HPI/Subjective:  Patient is more awake today, sitting up to eat breakfast, still slow to answer questions but at least verbal today.  Abdominal pain is improving.      Objective: Filed Vitals:   04/08/13 0537 04/08/13 1518 04/08/13 2302 04/09/13 0700  BP: 138/57 116/70 118/67 119/65  Pulse: 109 108 107 98  Temp: 98.3  F (36.8 C) 98.1 F (36.7 C) 98.2 F (36.8 C) 98.1 F (36.7 C)  TempSrc: Oral  Oral Oral  Resp: 18 20 20 20   Height:      Weight: 109.1 kg (240 lb 8.4 oz)     SpO2: 100% 99% 100% 97%    Intake/Output Summary (Last 24 hours) at 04/09/13 0959 Last data filed at 04/08/13 1610  Gross per 24 hour  Intake    150 ml  Output      0 ml  Net    150 ml   Filed Weights   04/07/13 0429 04/07/13 0915 04/08/13 0537  Weight: 109.4 kg (241 lb 2.9 oz) 109.4 kg (241 lb 2.9 oz) 109.1 kg (240 lb 8.4 oz)    Exam:   General:   Cachectic CM, No acute distress, more alert today, assisted him to sit up to start eating breakfast. HEENT:  NCAT, MMM  Cardiovascular:  RRR, nl S1, S2 no mrg, 2+ pulses, warm extremities  Respiratory:  CTAB, no increased WOB  Abdomen:   NABS, soft, TTP diffusely without rebound or guarding, moderately distended  MSK:   Normal tone and bulk, Diffuse anasarca with dependent edema  Neuro:  Awake and able to stay awake today for conversation  Data Reviewed: Basic Metabolic Panel:  Recent Labs Lab 04/05/13 0503 04/06/13 0427 04/07/13 0550 04/08/13 0536 04/09/13 0506  NA 133* 135 134* 134* 139  K 4.0 4.3 4.6 4.5 4.4  CL 99 101 99 100 104  CO2 26 28 25 28 27   GLUCOSE 133* 116* 144* 120* 134*  BUN 43* 30* 39* 42* 48*  CREATININE 6.46* 4.75* 5.39* 5.47* 6.08*  CALCIUM 8.3* 8.0* 8.2* 8.6 8.7  PHOS  --  2.5  --   --   --    Liver Function Tests:  Recent Labs Lab 04/04/13 1407 04/05/13 0503 04/07/13 0704 04/08/13 1203  AST 31 30 29 29   ALT 12 12 11 10   ALKPHOS 194* 165* 144* 150*  BILITOT 0.7 1.0 2.0* 1.4*  PROT 5.4* 5.3* 5.5* 5.9*  ALBUMIN 1.2* 1.1* 1.2* 1.2*    Recent Labs Lab 04/04/13 1407 04/07/13 0704  LIPASE 66* 34    Recent Labs Lab 04/06/13 0954 04/07/13 1247 04/08/13 1204  AMMONIA 132* 52 56   CBC:  Recent Labs Lab 04/04/13 1407 04/05/13 0503 04/06/13 0427 04/07/13 0550 04/08/13 0536 04/09/13 0506  WBC 8.0 7.5 7.4 14.4* 12.7* 8.6  NEUTROABS 4.6  --   --   --   --   --   HGB 6.4* 6.8* 8.8* 9.0* 8.5* 8.3*  HCT 20.0* 20.8* 26.4* 27.4* 25.8* 25.5*  MCV 94.8 92.0 91.3 92.3 92.8 93.1  PLT 139* 133* 114* 130* 125* 124*   Cardiac Enzymes: No results found for this basename: CKTOTAL, CKMB, CKMBINDEX, TROPONINI,  in the last 168 hours BNP (last 3 results)  Recent Labs  11/21/12 1145  PROBNP 1167.0*   CBG:  Recent Labs Lab 04/08/13 0735 04/08/13 1136 04/08/13 1634 04/08/13 2301 04/09/13 0807  GLUCAP 102* 99 144* 142* 104*     Recent Results (from the past 240 hour(s))  CULTURE, BLOOD (ROUTINE X 2)     Status: None   Collection Time    04/07/13 12:47 PM      Result Value Range Status   Specimen Description BLOOD BLOOD RIGHT HAND BOTTLES DRAWN AEROBIC ONLY   Final   Special Requests 10CC   Final   Culture     Final  Value: GRAM POSITIVE COCCI     Gram Stain Report Called to,Read Back By and Verified With: MARTIN,M @ 0320 ON 04/08/13 BY WOODIE,J     GS DONE @ [facility]   Report Status PENDING   Incomplete  CULTURE, BLOOD (ROUTINE X 2)     Status: None   Collection Time    04/07/13  1:02 PM      Result Value Range Status   Specimen Description BLOOD BLOOD RIGHT HAND BOTTLES DRAWN AEROBIC ONLY   Final   Special Requests 10CC   Final   Culture     Final   Value: GRAM POSITIVE COCCI     Gram Stain Report Called to,Read Back By and Verified With: MARTIN,M @ 0320 ON 04/08/13 BY WOODIE,J     GS DONE @ [facility]   Report Status PENDING   Incomplete  CULTURE, BLOOD (ROUTINE X 2)     Status: None   Collection Time    04/08/13 12:04 PM      Result Value Range Status   Specimen Description     Final   Value: BLOOD BLOOD RIGHT HAND BOTTLES DRAWN AEROBIC AND ANAEROBIC   Special Requests 8CC   Final   Culture PENDING   Incomplete   Report Status PENDING   Incomplete  CULTURE, BLOOD (ROUTINE X 2)     Status: None   Collection Time    04/08/13  4:27 PM      Result Value Range Status   Specimen Description BLOOD RIGHT HAND   Final   Special Requests BOTTLES DRAWN AEROBIC ONLY 6CC BOTTLE   Final   Culture PENDING   Incomplete   Report Status PENDING   Incomplete     Studies: Dg Abd 1 View  04/07/2013   CLINICAL DATA:  Abdominal pain and distention trauma data is patient, leukocytosis  ABDOMEN - 1 VIEW  COMPARISON:  Portable exam 1251 hr compared to 04/04/2013  FINDINGS: Normal bowel gas pattern.  Tips shunt identified.  No bowel dilatation or bowel wall thickening.  Bones demineralized.  Small right pleural effusion and  mild bibasilar opacities.  IMPRESSION: Normal bowel gas pattern.   Electronically Signed   By: Ulyses Southward M.D.   On: 04/07/2013 13:36    Scheduled Meds: . cefoTAXime (CLAFORAN) IV  2 g Intravenous Q24H  . feeding supplement  237 mL Oral BID BM  . folic acid  1 mg Oral Daily  . Gerhardt's butt cream   Topical BID  . insulin aspart  0-9 Units Subcutaneous TID WC  . lactulose  30 g Oral Q3H  . levothyroxine  100 mcg Oral QAC breakfast  . metoCLOPramide  5 mg Oral TID AC & HS  . pantoprazole  40 mg Oral BID AC  . sodium chloride  3 mL Intravenous Q12H  . vancomycin  1,000 mg Intravenous Q M,W,F-HD   Continuous Infusions:   Principal Problem:   Encephalopathy, hepatic Active Problems:   Alcoholic cirrhosis/remote TIPS   Diabetes mellitus type 2, uncontrolled, with complications   CKD (chronic kidney disease) stage V requiring chronic dialysis   Acute blood loss anemia   GI bleed   Abdominal pain   Thrombocytopenia, unspecified   GAVE (gastric antral vascular ectasia)   Protein-calorie malnutrition, severe   Chest pain   Gram-positive cocci bacteremia    Time spent: 30 min    Abdel Effinger  [facility] Pager [phone]. If 7PM-7AM, please contact night-coverage at [email or web address], password [facility] 04/09/2013, 9:59 AM  LOS: 5 days

## 2013-04-09 NOTE — Progress Notes (Signed)
Subjective: Interval History: No nausea or vomiting. Patient states that his abdominal pain history better. His appetite is still poor. Objective: Vital signs in last 24 hours: Temp:  [98.1 F (36.7 C)-98.2 F (36.8 C)] 98.1 F (36.7 C) (09/29 0700) Pulse Rate:  [98-108] 98 (09/29 0700) Resp:  [20] 20 (09/29 0700) BP: (116-119)/(65-70) 119/65 mmHg (09/29 0700) SpO2:  [97 %-100 %] 97 % (09/29 0700) Weight change:   Intake/Output from previous day: 09/28 0701 - 09/29 0700 In: 650 [P.O.:100; IV Piggyback:550] Out: -  Intake/Output this shift:    General appearance: alert, cooperative and no distress Resp: clear to auscultation bilaterally Cardio: regular rate and rhythm, S1, S2 normal, no murmur, click, rub or gallop GI: Distended, nontender Extremities: extremities normal, atraumatic, no cyanosis or edema  Lab Results:  Recent Labs  04/08/13 0536 04/09/13 0506  WBC 12.7* 8.6  HGB 8.5* 8.3*  HCT 25.8* 25.5*  PLT 125* 124*   BMET:   Recent Labs  04/08/13 0536 04/09/13 0506  NA 134* 139  K 4.5 4.4  CL 100 104  CO2 28 27  GLUCOSE 120* 134*  BUN 42* 48*  CREATININE 5.47* 6.08*  CALCIUM 8.6 8.7   No results found for this basename: PTH,  in the last 72 hours Iron Studies:  No results found for this basename: IRON, TIBC, TRANSFERRIN, FERRITIN,  in the last 72 hours  Studies/Results: Dg Abd 1 View  04/07/2013   CLINICAL DATA:  Abdominal pain and distention trauma data is patient, leukocytosis  ABDOMEN - 1 VIEW  COMPARISON:  Portable exam 1251 hr compared to 04/04/2013  FINDINGS: Normal bowel gas pattern.  Tips shunt identified.  No bowel dilatation or bowel wall thickening.  Bones demineralized.  Small right pleural effusion and mild bibasilar opacities.  IMPRESSION: Normal bowel gas pattern.   Electronically Signed   By: Ulyses Southward M.D.   On: 04/07/2013 13:36    I have reviewed the patient's current medications.  Assessment/Plan: Problem #1 end-stage as this  is status post hemodialysis the day short dialysis the day before yesterday and BUN and creatinine with in acceptable reange  . His potassium is normal Problem #2 anemia presently patient is status post blood transfusion his hemoglobin and hematocrit slowly declining. Problem #3 history of diabetes. Reasonably controlled Problem #4 history of liver cirrhosis/alcholic Problem #5 history of hepatic encephalopathy. Patient presently is alert. Problem #6 metabolic bone disease his calcium and phosphorus is was in acceptable range. Presently he is  off his  binder Problem #7 history of GI bleeding.Reqiuring repeated blood transfussion Plan: We'll make arrangements for patient to get dialysis today We'll check his basic metabolic panel,  CBC in the morning.     LOS: 5 days   Lian Tanori S 04/09/2013,8:30 AM

## 2013-04-10 LAB — GLUCOSE, CAPILLARY: Glucose-Capillary: 158 mg/dL — ABNORMAL HIGH (ref 70–99)

## 2013-04-10 MED ORDER — LACTULOSE 10 GM/15ML PO SOLN
30.0000 g | Freq: Three times a day (TID) | ORAL | Status: DC
  Administered 2013-04-10 – 2013-04-11 (×3): 30 g via ORAL
  Filled 2013-04-10 (×3): qty 60

## 2013-04-10 NOTE — Progress Notes (Signed)
Telepsych unable to be done to evaluate for capacity - MD aware.

## 2013-04-10 NOTE — Progress Notes (Signed)
[facility] PROGRESS NOTE  MARQUIST BINSTOCK ZOX:[id number] DOB: 01/14/1951 DOA: 04/04/2013 PCP: Ignatius Specking., MD  Assessment/Plan  Acinetobacter bacteremia:  This likely explains patient's leukocytosis.  Acinetobacter is frequently MDR and easily spread.  Patient already on isolation for MRSA but will need to make sure this will not affect his usual HD center.  Unclear source, but likely GI.   -  blood cultures from 9/28 NGTD -  F/u sensitivities -  Plan to transition to antibiotic that can be given during HD to complete a 14-day course  -  Will touch base with ID at [facility] to make sure there is no further work up to complete and to arrange follow up if needed.   -  D/c vancomycin  Abdominal pain:  CT scan demonstrated anasarca with asymmetric left flank edema which may represent hematoma, small volume ascites, stable chronic right pleural effusion, indicentally noticed chronic calcific pancreatitis and bilateral renal atrophy.  TIPS appeared stable.  On 9/26, abdominal pain increased.  Attempted paracentesis, however, there was not enough fluid to complete the procedure. Tx empirically for SBP.  Pain improving.   -  LFTs stable to improved. -  UA neg -  KUB unremarkable -  cefotaxime day 4/5  Hepatic encephalopathy, ammonia level 132 down to 50s and more alert today - Resume lactulose TID  Acute blood loss anemia, differential diagnoses includes acute on chronic GI bleeding from GAVE, portal hypertensive gastropathy. Occult stool positive. Transfused a total of 3 units of PRBC and hgb increased from 6.4 to 8.8mg /dl.   -  Iron levels have improved, B12 was wnl 03/15/13 -  TSH was very elevated.  -  Folate high.  Hypothyroidism.  Started synthroid.  F/u TSH level in about 4 weeks.  GAVE with recurrent UGIB  - Continue PPI  - Please minimize the use of carafate which contains aluminum in this ESRD patient  - Occult stool positive  End-stage renal disease.  HD today. Patient would like  to go home today post HD.   - Appreciate Nephrology recommendations - Continue home phosphorus binders, multivitamin  - Continue epo and iron with HD.   Alcoholic cirrhosis.  Low salt diet.  Fluid balance managed with HD.  Constipation, may be contributing to his abdominal pain also, but starting to have BMs  Severe protein calorie malnutrition.   -  Low salt diet with supplements   HTN, blood pressures low normal and hypoTN with HD recently  - Consider addition of midodrine prior to HD  - Avoid medications to lower BP   Atypical chest pain, may have anginal pain. Patient has risk factors, including HTN, HLD, hx of tobacco use, hx of DM, and ESRD.  - If recurrent, consider cardiology consult for cath    Hx of DM, last A1c in 4 range a few months ago.   Leukocytosis:  CXR neg.  UA neg.  May be secondary to SBP or bacteremia. Resolved.    Thrombocytopenia, plt stable.  Secondary to cirrhosis.  Diet: Low salt per patient request  Access: Fistula, PIV  IVF: OFF  Proph: SCDs   Code Status: full code  Family Communication: spoke with patient alone  Disposition Plan:  Patient alert, but not fully oriented.  He frequently changes the subject when talking about code status, hospice vs. SNF with ongoing HD.  I can't tell if he truly fully understands all of his medical problems because of his refusal to answer questions and his diversion techniques.  Will  get telepsych consult to make sure patient has capacity to make decisions regarding disposition and code status.  If not, will discuss these options with the son.  Anticipate discharge in the next 1-2 days pending speciation of Acinetobacter.  Patient has very poor functional status which is not likely to improve.  He is likely to return quickly to the hospital with recurrent GIB, hepatic encephalopathy, new infection, ulcers.  He seems to want better comfort, such as decreased abdominal pain and decreased pain in his legs, however, pain  medications contribute to his encephalopathy which leads to hospitalizations which he wants to avoid.    Consultants:  Nephrology  Wound care  GI  Procedures:  PRBC x 1 on 9/24  PRBC x 2 on 9/25  CT abd/pelvis 9/24  Antibiotics:  None  HPI/Subjective:  Patient is more awake today, sitting up to eat breakfast, still slow to answer questions and occasionally drifts off.        Objective: Filed Vitals:   04/09/13 1600 04/09/13 1615 04/09/13 2118 04/10/13 0541  BP: 107/61 117/61 119/55 125/62  Pulse: 100 102 105 104  Temp:  98 F (36.7 C) 97.4 F (36.3 C) 98.3 F (36.8 C)  TempSrc:  Oral Oral Oral  Resp:  18 19 19   Height:      Weight:    109.2 kg (240 lb 11.9 oz)  SpO2:  97% 99% 100%    Intake/Output Summary (Last 24 hours) at 04/10/13 1041 Last data filed at 04/09/13 2108  Gross per 24 hour  Intake     50 ml  Output   1100 ml  Net  -1050 ml   Filed Weights   04/08/13 0537 04/09/13 1215 04/10/13 0541  Weight: 109.1 kg (240 lb 8.4 oz) 109.5 kg (241 lb 6.5 oz) 109.2 kg (240 lb 11.9 oz)    Exam:   General:  Cachectic CM, No acute distress, more alert today   HEENT:  NCAT, MMM  Cardiovascular:  RRR, nl S1, S2 no mrg, 2+ pulses, warm extremities  Respiratory:  CTAB, no increased WOB  Abdomen:   NABS, soft, TTP diffusely without rebound or guarding, moderately distended  MSK:   Normal tone and bulk, Diffuse anasarca with dependent edema  Neuro:  Awake and able to stay awake today for conversation  Data Reviewed: Basic Metabolic Panel:  Recent Labs Lab 04/05/13 0503 04/06/13 0427 04/07/13 0550 04/08/13 0536 04/09/13 0506  NA 133* 135 134* 134* 139  K 4.0 4.3 4.6 4.5 4.4  CL 99 101 99 100 104  CO2 26 28 25 28 27   GLUCOSE 133* 116* 144* 120* 134*  BUN 43* 30* 39* 42* 48*  CREATININE 6.46* 4.75* 5.39* 5.47* 6.08*  CALCIUM 8.3* 8.0* 8.2* 8.6 8.7  PHOS  --  2.5  --   --   --    Liver Function Tests:  Recent Labs Lab 04/04/13 1407  04/05/13 0503 04/07/13 0704 04/08/13 1203  AST 31 30 29 29   ALT 12 12 11 10   ALKPHOS 194* 165* 144* 150*  BILITOT 0.7 1.0 2.0* 1.4*  PROT 5.4* 5.3* 5.5* 5.9*  ALBUMIN 1.2* 1.1* 1.2* 1.2*    Recent Labs Lab 04/04/13 1407 04/07/13 0704  LIPASE 66* 34    Recent Labs Lab 04/06/13 0954 04/07/13 1247 04/08/13 1204  AMMONIA 132* 52 56   CBC:  Recent Labs Lab 04/04/13 1407 04/05/13 0503 04/06/13 0427 04/07/13 0550 04/08/13 0536 04/09/13 0506  WBC 8.0 7.5 7.4 14.4* 12.7* 8.6  NEUTROABS 4.6  --   --   --   --   --   HGB 6.4* 6.8* 8.8* 9.0* 8.5* 8.3*  HCT 20.0* 20.8* 26.4* 27.4* 25.8* 25.5*  MCV 94.8 92.0 91.3 92.3 92.8 93.1  PLT 139* 133* 114* 130* 125* 124*   Cardiac Enzymes: No results found for this basename: CKTOTAL, CKMB, CKMBINDEX, TROPONINI,  in the last 168 hours BNP (last 3 results)  Recent Labs  11/21/12 1145  PROBNP 1167.0*   CBG:  Recent Labs Lab 04/09/13 0807 04/09/13 1123 04/09/13 1641 04/09/13 2050 04/10/13 0738  GLUCAP 104* 101* 93 160* 153*    Recent Results (from the past 240 hour(s))  CULTURE, BLOOD (ROUTINE X 2)     Status: None   Collection Time    04/07/13 12:47 PM      Result Value Range Status   Specimen Description BLOOD RIGHT HAND   Final   Special Requests BOTTLES DRAWN AEROBIC ONLY 10CC   Final   Culture  Setup Time     Final   Value: 04/08/2013 20:10     Performed at [facility]   Culture     Final   Value: ACINETOBACTER CALCOACETICUS/BAUMANNII COMPLEX     Note: CORRECTED REPORT CALLED TO MARGARET BAUGHAM 04/09/13 1045 BY SMITHERSJ     Performed at [facility]   Report Status PENDING   Incomplete  CULTURE, BLOOD (ROUTINE X 2)     Status: None   Collection Time    04/07/13  1:02 PM      Result Value Range Status   Specimen Description BLOOD RIGHT HAND   Final   Special Requests BOTTLES DRAWN AEROBIC ONLY 10CC   Final   Culture  Setup Time     Final   Value: 04/08/2013 20:10     Performed at  [facility]   Culture     Final   Value: ACINETOBACTER CALCOACETICUS/BAUMANNII COMPLEX     Note: CORRECTED REPORT CALLED TO MARGARET BAUGHAM AT [facility] 04/09/13 1045     Performed at [facility]   Report Status PENDING   Incomplete  CULTURE, BLOOD (ROUTINE X 2)     Status: None   Collection Time    04/08/13 12:04 PM      Result Value Range Status   Specimen Description BLOOD RIGHT HAND   Final   Special Requests BOTTLES DRAWN AEROBIC AND ANAEROBIC 8CC   Final   Culture NO GROWTH 2 DAYS   Final   Report Status PENDING   Incomplete  CULTURE, BLOOD (ROUTINE X 2)     Status: None   Collection Time    04/08/13  4:27 PM      Result Value Range Status   Specimen Description BLOOD RIGHT HAND   Final   Special Requests BOTTLES DRAWN AEROBIC ONLY 6CC BOTTLE   Final   Culture NO GROWTH 2 DAYS   Final   Report Status PENDING   Incomplete     Studies: No results found.  Scheduled Meds: . cefoTAXime (CLAFORAN) IV  2 g Intravenous Q24H  . feeding supplement  237 mL Oral BID BM  . folic acid  1 mg Oral Daily  . Gerhardt's butt cream   Topical BID  . insulin aspart  0-9 Units Subcutaneous TID WC  . lactulose  30 g Oral Q3H  . levothyroxine  100 mcg Oral QAC breakfast  . metoCLOPramide  5 mg Oral TID AC & HS  . pantoprazole  40 mg Oral BID AC  . sodium chloride  3 mL Intravenous Q12H   Continuous Infusions:   Principal Problem:   Sepsis due to Acinetobacter species Active Problems:   Alcoholic cirrhosis/remote TIPS   Diabetes mellitus type 2, uncontrolled, with complications   CKD (chronic kidney disease) stage V requiring chronic dialysis   Acute blood loss anemia   GI bleed   Abdominal pain   Thrombocytopenia, unspecified   GAVE (gastric antral vascular ectasia)   Protein-calorie malnutrition, severe   Chest pain   Encephalopathy, hepatic    Time spent: 30 min    Timoty Bourke, [facility]  [facility] Pager [phone]. If 7PM-7AM, please contact night-coverage  at [email or web address], password [facility] 04/10/2013, 10:41 AM  LOS: 6 days

## 2013-04-10 NOTE — Progress Notes (Signed)
NUTRITION FOLLOW UP  Intervention:   Nepro Shake po BID, each supplement provides 425 kcal and 19 grams protein.  RD will continue to follow nutrition care and follow for decisions based on goals of care.   Nutrition Dx:   Malnutrition (severe); ongoing related to end-stage disease and poor nutritional intake.  Goal:   Pt to meet >/= 90% of their estimated nutrition needs   Monitor:   Po intake, labs and wt trends, changes in status  Assessment:   Intake continues to be poor (PO: 25-50%). Nursing noted pt is refusing foods and liquids at times. Continues to receive Nepro BID.  Wt loss of 9# (3.6%) x 5 days is significant, however, likely due to fluid loss. Pt has HD yesterday. Noted that MD attempted to discuss goals of care (including hospice) with pt, however, pt still with AMS. Telesysch consult has been ordered to determine if pt is able to make his own medical decisions.   Height: Ht Readings from Last 1 Encounters:  04/04/13 6\' 3"  (1.905 m)    Weight Status:   Wt Readings from Last 1 Encounters:  04/10/13 240 lb 11.9 oz (109.2 kg)    Re-estimated needs:  Kcal: 2600-2700 daily Protein: 110-120 grams daily Fluid: 1000 ml plus urine output daily  Skin: shallow areas of broken skin on left leg   Diet Order: Sodium Restricted   Intake/Output Summary (Last 24 hours) at 04/10/13 1114 Last data filed at 04/09/13 2108  Gross per 24 hour  Intake     50 ml  Output   1100 ml  Net  -1050 ml    Last BM: 04/10/13   Labs:   Recent Labs Lab 04/06/13 0427 04/07/13 0550 04/08/13 0536 04/09/13 0506  NA 135 134* 134* 139  K 4.3 4.6 4.5 4.4  CL 101 99 100 104  CO2 28 25 28 27   BUN 30* 39* 42* 48*  CREATININE 4.75* 5.39* 5.47* 6.08*  CALCIUM 8.0* 8.2* 8.6 8.7  PHOS 2.5  --   --   --   GLUCOSE 116* 144* 120* 134*    CBG (last 3)   Recent Labs  04/09/13 1641 04/09/13 2050 04/10/13 0738  GLUCAP 93 160* 153*    Scheduled Meds: . cefoTAXime (CLAFORAN) IV  2  g Intravenous Q24H  . feeding supplement  237 mL Oral BID BM  . folic acid  1 mg Oral Daily  . Gerhardt's butt cream   Topical BID  . insulin aspart  0-9 Units Subcutaneous TID WC  . lactulose  30 g Oral TID  . levothyroxine  100 mcg Oral QAC breakfast  . metoCLOPramide  5 mg Oral TID AC & HS  . pantoprazole  40 mg Oral BID AC  . sodium chloride  3 mL Intravenous Q12H    Continuous Infusions:   Latesia Norrington A. Mayford Knife, RD, LDN Pager: [phone]

## 2013-04-10 NOTE — BHH Counselor (Signed)
Writer has spoken with Verne Spurr, PA and was advised that the pt need are not clear. Writer spoke with Aundra Millet, RN and Dr. Caesar Bookman and it was determined that the TP is to determine the pt's capacity, not competency. Denice Bors, [facility] 04/10/2013 3:59 PM

## 2013-04-10 NOTE — Progress Notes (Signed)
Requested a psychiatry consultation today to assist with assessing the patient's capacity to make decisions, however, due to his medical conditions, they declines.  Clearly the patient is recovering from sepsis and hepatic encephalopathy, however, he is near his baseline mental status according to his Nephrologist.  His family feels that recently he has been increasingly confused and not answering questions appropriately or reliably which is in keeping with my experiences talking with him.  They have been worrying that they need to establish a living will.  The sons have already assumed HPOA.  The question is raised whether the patient is having progression of his chronic disease to the point that he is not longer competent to make decisions.  I think over the next few days his baseline may become more clear, but currently I do not feel he has capacity to make decisions.  He is unable to tell me what the expectations are should he go to skilled nursing vs. To hospice.  Again, as per my previous notes, he states he wants to have his legs get better (fluid removed?) and he wants to walk out of his SNF to be cared for by his wife.  These are not reasonable expectations given his worsening functional status, recurrent illnesses, and difficulties with hypotension during HD.  He has not tolerated PT/OT well recently.  He is not eating well.  He is not oriented to where he is and why he is here.  Again, this may improve over the next few days.  If he regains ability to make his own medical decisions, then it is likely he will decide to remain full code.  His nephrologist is okay with continuing dialysis at this time if that is the patient's wish.  However, if it is felt that he is developing baseline confusion and inability to make decisions even on his best days, his family will be responsible for disposition planning.  Per my conversations with them, they are leaning towards cessation of HD and hospice, if not now, in  the near future.  This needs to be an ongoing conversation with the patient and his family.

## 2013-04-10 NOTE — Clinical Social Work Note (Signed)
CSW updated facility on pt who continue to be willing to accept pt at d/c. Awaiting telepsych for capacity determination. CSW will continue to follow.  Derenda Fennel, [place] [phone]

## 2013-04-10 NOTE — Progress Notes (Signed)
Subjective: Interval History: No nausea or vomiting but poor appetite. Patient states that his abdominal pain history better. Complains of feeling weak Objective: Vital signs in last 24 hours: Temp:  [97.4 F (36.3 C)-98.3 F (36.8 C)] 98.3 F (36.8 C) (09/30 0541) Pulse Rate:  [98-110] 104 (09/30 0541) Resp:  [18-19] 19 (09/30 0541) BP: (97-133)/(51-69) 125/62 mmHg (09/30 0541) SpO2:  [97 %-100 %] 100 % (09/30 0541) Weight:  [109.2 kg (240 lb 11.9 oz)-109.5 kg (241 lb 6.5 oz)] 109.2 kg (240 lb 11.9 oz) (09/30 0541) Weight change:   Intake/Output from previous day: 09/29 0701 - 09/30 0700 In: 50 [P.O.:50] Out: 1100  Intake/Output this shift:    General appearance: alert, cooperative and no distress Resp: clear to auscultation bilaterally Cardio: regular rate and rhythm, S1, S2 normal, no murmur, click, rub or gallop GI: Distended, nontender Extremities: extremities normal, atraumatic, no cyanosis or edema  Lab Results:  Recent Labs  04/08/13 0536 04/09/13 0506  WBC 12.7* 8.6  HGB 8.5* 8.3*  HCT 25.8* 25.5*  PLT 125* 124*   BMET:   Recent Labs  04/08/13 0536 04/09/13 0506  NA 134* 139  K 4.5 4.4  CL 100 104  CO2 28 27  GLUCOSE 120* 134*  BUN 42* 48*  CREATININE 5.47* 6.08*  CALCIUM 8.6 8.7   No results found for this basename: PTH,  in the last 72 hours Iron Studies:  No results found for this basename: IRON, TIBC, TRANSFERRIN, FERRITIN,  in the last 72 hours  Studies/Results: No results found.  I have reviewed the patient'Chan current medications.  Assessment/Plan: Problem #1 end-stage as this is status post hemodialysis dialysis yesterday Problem #2 anemia presently patient is status post blood transfusion his hemoglobin and hematocrit slowly declining. Problem #3 history of diabetes. Reasonably controlled Problem #4 history of liver cirrhosis/alcholic Problem #5 history of hepatic encephalopathy. Patient presently somelent but arousable. Problem #6  metabolic bone disease his calcium and phosphorus is with  in acceptable range. Presently he is  off his  binder Problem #7 history of GI bleeding.Reqiuring repeated blood transfusion Problem #8 gram + bactermia  Presently afebrile and wbc has normalized Plan: We'll make arrangements for patient to get dialysis tomorrow We'll check his basic metabolic panel,  CBC in the morning.     LOS: 6 days   Timothy Chan 04/10/2013,7:19 AM

## 2013-04-11 DIAGNOSIS — E1165 Type 2 diabetes mellitus with hyperglycemia: Secondary | ICD-10-CM

## 2013-04-11 DIAGNOSIS — IMO0002 Reserved for concepts with insufficient information to code with codable children: Secondary | ICD-10-CM

## 2013-04-11 LAB — GLUCOSE, CAPILLARY
Glucose-Capillary: 196 mg/dL — ABNORMAL HIGH (ref 70–99)
Glucose-Capillary: 124 mg/dL — ABNORMAL HIGH (ref 70–99)

## 2013-04-11 LAB — PREPARE RBC (CROSSMATCH)

## 2013-04-11 LAB — CULTURE, BLOOD (ROUTINE X 2)

## 2013-04-11 LAB — HEMOGLOBIN AND HEMATOCRIT, BLOOD: HCT: 26.9 % — ABNORMAL LOW (ref 39.0–52.0)

## 2013-04-11 LAB — CBC
HCT: 22.2 % — ABNORMAL LOW (ref 39.0–52.0)
Hemoglobin: 7.2 g/dL — ABNORMAL LOW (ref 13.0–17.0)
MCH: 30.1 pg (ref 26.0–34.0)
MCHC: 32.4 g/dL (ref 30.0–36.0)
MCV: 92.9 fL (ref 78.0–100.0)
Platelets: 120 10*3/uL — ABNORMAL LOW (ref 150–400)
RBC: 2.39 MIL/uL — ABNORMAL LOW (ref 4.22–5.81)
RDW: 18.1 % — ABNORMAL HIGH (ref 11.5–15.5)
WBC: 6.7 10*3/uL (ref 4.0–10.5)

## 2013-04-11 LAB — PHOSPHORUS: Phosphorus: 3 mg/dL (ref 2.3–4.6)

## 2013-04-11 LAB — BASIC METABOLIC PANEL
BUN: 33 mg/dL — ABNORMAL HIGH (ref 6–23)
CO2: 28 mEq/L (ref 19–32)
Calcium: 8.2 mg/dL — ABNORMAL LOW (ref 8.4–10.5)
Chloride: 101 mEq/L (ref 96–112)
Creatinine, Ser: 4.62 mg/dL — ABNORMAL HIGH (ref 0.50–1.35)
Glucose, Bld: 171 mg/dL — ABNORMAL HIGH (ref 70–99)

## 2013-04-11 LAB — HEPATITIS B SURFACE ANTIGEN: Hepatitis B Surface Ag: NEGATIVE

## 2013-04-11 MED ORDER — PENTAFLUOROPROP-TETRAFLUOROETH EX AERO
1.0000 "application " | INHALATION_SPRAY | CUTANEOUS | Status: DC | PRN
  Filled 2013-04-11: qty 103.5

## 2013-04-11 MED ORDER — PANTOPRAZOLE SODIUM 40 MG IV SOLR
40.0000 mg | Freq: Two times a day (BID) | INTRAVENOUS | Status: DC
  Administered 2013-04-11 – 2013-04-14 (×6): 40 mg via INTRAVENOUS
  Filled 2013-04-11 (×6): qty 40

## 2013-04-11 MED ORDER — SODIUM CHLORIDE 0.9 % IV SOLN
250.0000 mg | Freq: Two times a day (BID) | INTRAVENOUS | Status: DC
  Administered 2013-04-11 – 2013-04-14 (×6): 250 mg via INTRAVENOUS
  Filled 2013-04-11 (×10): qty 250

## 2013-04-11 MED ORDER — NEPRO/CARBSTEADY PO LIQD: 237.0000 mL | ORAL | Status: DC | PRN

## 2013-04-11 MED ORDER — LIDOCAINE-PRILOCAINE 2.5-2.5 % EX CREA
1.0000 "application " | TOPICAL_CREAM | CUTANEOUS | Status: DC | PRN
  Administered 2013-04-11: 1 via TOPICAL
  Filled 2013-04-11: qty 5

## 2013-04-11 MED ORDER — SODIUM CHLORIDE 0.9 % IV SOLN: 100.0000 mL | INTRAVENOUS | Status: DC | PRN

## 2013-04-11 MED ORDER — LIDOCAINE HCL (PF) 1 % IJ SOLN: 5.0000 mL | INTRAMUSCULAR | Status: DC | PRN

## 2013-04-11 MED ORDER — HEPARIN SODIUM (PORCINE) 1000 UNIT/ML DIALYSIS
1000.0000 [IU] | INTRAMUSCULAR | Status: DC | PRN
  Filled 2013-04-11: qty 1

## 2013-04-11 MED ORDER — LACTULOSE 10 GM/15ML PO SOLN
30.0000 g | Freq: Three times a day (TID) | ORAL | Status: DC
  Administered 2013-04-11 – 2013-04-14 (×5): 30 g via ORAL
  Filled 2013-04-11 (×10): qty 60

## 2013-04-11 MED ORDER — ALTEPLASE 2 MG IJ SOLR
2.0000 mg | Freq: Once | INTRAMUSCULAR | Status: AC | PRN
  Filled 2013-04-11: qty 2

## 2013-04-11 MED ORDER — SODIUM CHLORIDE 0.9 % IV SOLN
100.0000 mL | INTRAVENOUS | Status: DC | PRN
  Administered 2013-04-11: 100 mL via INTRAVENOUS

## 2013-04-11 NOTE — Procedures (Signed)
   HEMODIALYSIS TREATMENT NOTE:  3.5 hour heparin free dialysis session completed via left forearm AVF (15g/antegrade).  Goal NOT met:  BP unable to tolerate prescribed goal of 1.5 liters.  UF was interrupted for 30 minutes for SBP<90 (asymptomatic).  Net UF removed was 1.2 liters.  Pt received one unit of PRBCs with HD.  All blood was reinfused and hemostasis was achieved within 15 minutes. Report given to Deberah Castle, RN.  Cynthis Purington L. Justene Jensen, RN, CDN

## 2013-04-11 NOTE — Progress Notes (Addendum)
[facility] PROGRESS NOTE  Timothy Chan WUJ:[id number] DOB: 05/05/1951 DOA: 04/04/2013 PCP: Timothy Specking., MD   62 y.o. year-old male with history of alcoholic Timothy Chan status post TIPS procedure approximately 10 years ago, end-stage renal disease on hemodialysis, recurrent upper GI bleeding secondary to gastric antral vascular ectasia (GAVE) who is followed at [facility], [place] and by Timothy Chan, HTN, and T2DM who presents with fall with right upper quadrant pain found to have bacteremia   Assessment/Plan  1. Acinetobacter bacteremia:  Afebrile; -  blood cultures from 9/28 NGTD -  sens to imipenem; d/c Timothy Chan ID; who reccommended to complete antibiotic that can be given during HD to complete a 14-day course   2. Abdominal pain:  CT scan demonstrated anasarca with asymmetric left flank edema which may represent hematoma, small volume ascites, stable chronic right pleural effusion, indicentally noticed chronic calcific pancreatitis and bilateral renal atrophy.  TIPS appeared stable.  On 9/26, abdominal pain increased.  Attempted paracentesis, however, there was not enough fluid to complete the procedure. Tx empirically for SBP.  Pain improving.   -  LFTs stable to improved.KUB unremarkable IV atx   3. Alcoholic cirrhosis.  Low salt diet.  Fluid balance managed with HD. -s/p EGD (11/2012): portal hypertensive gastropathy, vascular ectasia; esophagitis  -Hepatic encephalopathy, ammonia level 132 down to 50s and more alert today; Resume lactulose TID  4. Acute blood loss anemia, differential diagnoses includes acute on chronic GI bleeding from GAVE, portal hypertensive gastropathy. Occult stool positive. Transfused a total of 3 units of PRBC and hgb increased from 6.4 to 8.8mg /dl.   - TF one unit 95/6; Continue PPI   Addendum:  - had hematochezia post HD; hypotensive tachycardia  -will TF another unit; TF PRN ; Hg Q 6 hours; d/w GI Timothy Chan who kindly agreed to f/u on the patient for  possible endoscopy  -will TF patient to ICU   5. Hypothyroidism.  Started synthroid.  F/u TSH level in about 4 weeks.  6. End-stage renal disease.  Continue epo and iron with HD.   7. Severe protein calorie malnutrition.   Low salt diet with supplements   8. HTN, blood pressures low normal and hypoTN with HD recently;  Avoid medications to lower BP   9. Atypical chest pain, may have anginal pain. Patient has risk factors, including HTN, HLD, hx of tobacco use, hx of DM, and ESRD.  - If recurrent, consider cardiology consult for cath    11. Hx of DM, last A1c in 4 range a few months ago.   12. Thrombocytopenia, plt stable.  Secondary to cirrhosis.   D/w patient, and called d/w Timothy Chan,Timothy Chan [phone];  -informed about ongoing acute medical issues; patient, family still wants full code but may decide to withdrawal of care in future  -prognosis is poor;   Diet: Low salt per patient request  Access: Fistula, PIV  IVF: OFF  Proph: SCDs   Code Status: full code  Family Communication: spoke with patient alone  Disposition Plan:  SNF   Consultants:  Nephrology  Wound care  GI  Procedures:  PRBC x 1 on 9/24  PRBC x 2 on 9/25  CT abd/pelvis 9/24  Antibiotics:  None  HPI/Subjective:  Patient is more awake today, sitting up to eat breakfast, still slow to answer questions and occasionally drifts off.        Objective: Filed Vitals:   04/10/13 1420 04/10/13 2207 04/11/13 0419 04/11/13 0523  BP: 122/67 102/63  102/64  Pulse: 99 107  105  Temp: 97.9 F (36.6 C) 98.2 F (36.8 C)  98.4 F (36.9 C)  TempSrc: Oral Oral  Oral  Resp: 18 18  18   Height:      Weight:   111.6 kg (246 lb 0.5 oz)   SpO2: 99% 97%  97%    Intake/Output Summary (Last 24 hours) at 04/11/13 0928 Last data filed at 04/11/13 0523  Gross per 24 hour  Intake    520 ml  Output      0 ml  Net    520 ml   Filed Weights   04/09/13 1215 04/10/13 0541 04/11/13 0419  Weight: 109.5 kg (241  lb 6.5 oz) 109.2 kg (240 lb 11.9 oz) 111.6 kg (246 lb 0.5 oz)    Exam:   General:  Cachectic CM, No acute distress, more alert today   HEENT:  NCAT, MMM  Cardiovascular:  RRR, nl S1, S2 no mrg, 2+ pulses, warm extremities  Respiratory:  CTAB, no increased WOB  Abdomen:   NABS, soft, TTP diffusely without rebound or guarding, moderately distended  MSK:   Normal tone and bulk, Diffuse anasarca with dependent edema  Neuro:  Awake and able to stay awake today for conversation  Data Reviewed: Basic Metabolic Panel:  Recent Labs Lab 04/06/13 0427 04/07/13 0550 04/08/13 0536 04/09/13 0506 04/11/13 0520  NA 135 134* 134* 139 135  K 4.3 4.6 4.5 4.4 4.6  CL 101 99 100 104 101  CO2 28 25 28 27 28   GLUCOSE 116* 144* 120* 134* 171*  BUN 30* 39* 42* 48* 33*  CREATININE 4.75* 5.39* 5.47* 6.08* 4.62*  CALCIUM 8.0* 8.2* 8.6 8.7 8.2*  PHOS 2.5  --   --   --  3.0   Liver Function Tests:  Recent Labs Lab 04/04/13 1407 04/05/13 0503 04/07/13 0704 04/08/13 1203  AST 31 30 29 29   ALT 12 12 11 10   ALKPHOS 194* 165* 144* 150*  BILITOT 0.7 1.0 2.0* 1.4*  PROT 5.4* 5.3* 5.5* 5.9*  ALBUMIN 1.2* 1.1* 1.2* 1.2*    Recent Labs Lab 04/04/13 1407 04/07/13 0704  LIPASE 66* 34    Recent Labs Lab 04/06/13 0954 04/07/13 1247 04/08/13 1204  AMMONIA 132* 52 56   CBC:  Recent Labs Lab 04/04/13 1407  04/06/13 0427 04/07/13 0550 04/08/13 0536 04/09/13 0506 04/11/13 0520  WBC 8.0  < > 7.4 14.4* 12.7* 8.6 6.7  NEUTROABS 4.6  --   --   --   --   --   --   HGB 6.4*  < > 8.8* 9.0* 8.5* 8.3* 7.2*  HCT 20.0*  < > 26.4* 27.4* 25.8* 25.5* 22.2*  MCV 94.8  < > 91.3 92.3 92.8 93.1 92.9  PLT 139*  < > 114* 130* 125* 124* 120*  < > = values in this interval not displayed. Cardiac Enzymes: No results found for this basename: CKTOTAL, CKMB, CKMBINDEX, TROPONINI,  in the last 168 hours BNP (last 3 results)  Recent Labs  11/21/12 1145  PROBNP 1167.0*   CBG:  Recent Labs Lab  04/10/13 0738 04/10/13 1132 04/10/13 1627 04/10/13 2200 04/11/13 0737  GLUCAP 153* 158* 230* 178* 196*    Recent Results (from the past 240 hour(s))  CULTURE, BLOOD (ROUTINE X 2)     Status: None   Collection Time    04/07/13 12:47 PM      Result Value Range Status   Specimen Description BLOOD RIGHT HAND   Final  Special Requests BOTTLES DRAWN AEROBIC ONLY 10CC   Final   Culture  Setup Time     Final   Value: 04/08/2013 20:10     Performed at [facility]   Culture     Final   Value: ACINETOBACTER CALCOACETICUS/BAUMANNII COMPLEX     Note: CORRECTED REPORT CALLED TO MARGARET BAUGHAM 04/09/13 1045 BY SMITHERSJ     Performed at [facility]   Report Status PENDING   Incomplete  CULTURE, BLOOD (ROUTINE X 2)     Status: None   Collection Time    04/07/13  1:02 PM      Result Value Range Status   Specimen Description BLOOD RIGHT HAND   Final   Special Requests BOTTLES DRAWN AEROBIC ONLY 10CC   Final   Culture  Setup Time     Final   Value: 04/08/2013 20:10     Performed at [facility]   Culture     Final   Value: ACINETOBACTER CALCOACETICUS/BAUMANNII COMPLEX     Note: CORRECTED REPORT CALLED TO MARGARET BAUGHAM AT [facility] 04/09/13 1045     Performed at [facility]   Report Status PENDING   Incomplete  CULTURE, BLOOD (ROUTINE X 2)     Status: None   Collection Time    04/08/13 12:04 PM      Result Value Range Status   Specimen Description BLOOD RIGHT HAND   Final   Special Requests BOTTLES DRAWN AEROBIC AND ANAEROBIC 8CC   Final   Culture NO GROWTH 2 DAYS   Final   Report Status PENDING   Incomplete  CULTURE, BLOOD (ROUTINE X 2)     Status: None   Collection Time    04/08/13  4:27 PM      Result Value Range Status   Specimen Description BLOOD RIGHT HAND   Final   Special Requests BOTTLES DRAWN AEROBIC ONLY 6CC BOTTLE   Final   Culture NO GROWTH 2 DAYS   Final   Report Status PENDING   Incomplete     Studies: No results found.  Scheduled  Meds: . cefoTAXime (CLAFORAN) IV  2 g Intravenous Q24H  . feeding supplement  237 mL Oral BID BM  . folic acid  1 mg Oral Daily  . Gerhardt's butt cream   Topical BID  . insulin aspart  0-9 Units Subcutaneous TID WC  . lactulose  30 g Oral TID  . levothyroxine  100 mcg Oral QAC breakfast  . metoCLOPramide  5 mg Oral TID AC & HS  . pantoprazole  40 mg Oral BID AC  . sodium chloride  3 mL Intravenous Q12H   Continuous Infusions:   Principal Problem:   Sepsis due to Acinetobacter species Active Problems:   Alcoholic cirrhosis/remote TIPS   Diabetes mellitus type 2, uncontrolled, with complications   CKD (chronic kidney disease) stage V requiring chronic dialysis   Acute blood loss anemia   GI bleed   Abdominal pain   Thrombocytopenia, unspecified   GAVE (gastric antral vascular ectasia)   Protein-calorie malnutrition, severe   Chest pain   Encephalopathy, hepatic    Time spent: 30 min    Esperanza Sheets  [facility] Pager [phone]. If 7PM-7AM, please contact night-coverage at [email or web address], password [facility] 04/11/2013, 9:28 AM  LOS: 7 days

## 2013-04-11 NOTE — Progress Notes (Signed)
Inpatient Diabetes Program Recommendations  AACE/ADA: New Consensus Statement on Inpatient Glycemic Control (2013)  Target Ranges:  Prepandial:   less than 140 mg/dL      Peak postprandial:   less than 180 mg/dL (1-2 hours)      Critically ill patients:  140 - 180 mg/dL   Results for KAWIKA, BISCHOFF (MRN [id number]) as of 04/11/2013 10:26  Ref. Range 04/10/2013 07:38 04/10/2013 11:32 04/10/2013 16:27 04/10/2013 22:00 04/11/2013 07:37  Glucose-Capillary Latest Range: 70-99 mg/dL 782 (H) 956 (H) 213 (H) 178 (H) 196 (H)    Inpatient Diabetes Program Recommendations Correction (SSI): Please consider increasing Novolog correction to moderate scale.    Thanks, Orlando Penner, RN, MSN, CCRN Diabetes Coordinator Inpatient Diabetes Program [phone] (Team Pager) [phone] ([facility] office) [phone] [facility] office)

## 2013-04-11 NOTE — Progress Notes (Signed)
Timothy Chan  MRN: [id number]  DOB/AGE: 1951/07/01 62 y.o.  Primary Care Physician:VYAS,DHRUV B., MD  Admit date: 04/04/2013  Chief Complaint:  Chief Complaint  Patient presents with  . Flank Pain    S-Pt presented on  04/04/2013 with  Chief Complaint  Patient presents with  . Flank Pain  .  Pt offers no new complaints. Pt says he is feeling better. Pt sen on Dialysis. Pt tolerating tx well.   meds . cefoTAXime (CLAFORAN) IV  2 g Intravenous Q24H  . feeding supplement  237 mL Oral BID BM  . folic acid  1 mg Oral Daily  . Gerhardt's butt cream   Topical BID  . insulin aspart  0-9 Units Subcutaneous TID WC  . lactulose  30 g Oral TID  . levothyroxine  100 mcg Oral QAC breakfast  . metoCLOPramide  5 mg Oral TID AC & HS  . pantoprazole  40 mg Oral BID AC  . sodium chloride  3 mL Intravenous Q12H       Physical Exam: Vital signs in last 24 hours: Temp:  [97.9 F (36.6 C)-98.4 F (36.9 C)] 98.2 F (36.8 C) (10/01 0915) Pulse Rate:  [99-111] 107 (10/01 1030) Resp:  [16-18] 16 (10/01 0915) BP: (95-128)/(47-67) 95/47 mmHg (10/01 1030) SpO2:  [97 %-99 %] 97 % (10/01 0915) Weight:  [246 lb 0.5 oz (111.6 kg)] 246 lb 0.5 oz (111.6 kg) (10/01 0915) Weight change: 4 lb 10.1 oz (2.1 kg) Last BM Date: 04/10/13  Intake/Output from previous day: 09/30 0701 - 10/01 0700 In: 640 [P.O.:640] Out: -      Physical Exam: General- pt is awake, follows commands. Resp- No acute REsp distress, Decreased BS at bases. CVS- S1S2 regular in rhythm GIT- BS+, soft, NT, ND EXT- 2+ LE Edema,  Access- left AVF + two needles in situ   Lab Results: CBC  Recent Labs  04/09/13 0506 04/11/13 0520  WBC 8.6 6.7  HGB 8.3* 7.2*  HCT 25.5* 22.2*  PLT 124* 120*    BMET  Recent Labs  04/09/13 0506 04/11/13 0520  NA 139 135  K 4.4 4.6  CL 104 101  CO2 27 28  GLUCOSE 134* 171*  BUN 48* 33*  CREATININE 6.08* 4.62*  CALCIUM 8.7 8.2*    MICRO Recent Results (from the past  240 hour(s))  CULTURE, BLOOD (ROUTINE X 2)     Status: None   Collection Time    04/07/13 12:47 PM      Result Value Range Status   Specimen Description BLOOD RIGHT HAND   Final   Special Requests BOTTLES DRAWN AEROBIC ONLY 10CC   Final   Culture  Setup Time     Final   Value: 04/08/2013 20:10     Performed at [facility]   Culture     Final   Value: ACINETOBACTER CALCOACETICUS/BAUMANNII COMPLEX     Note: CORRECTED REPORT CALLED TO MARGARET BAUGHAM 04/09/13 1045 BY SMITHERSJ     Performed at [facility]   Report Status PENDING   Incomplete  CULTURE, BLOOD (ROUTINE X 2)     Status: None   Collection Time    04/07/13  1:02 PM      Result Value Range Status   Specimen Description BLOOD RIGHT HAND   Final   Special Requests BOTTLES DRAWN AEROBIC ONLY 10CC   Final   Culture  Setup Time     Final   Value: 04/08/2013 20:10  Performed at [facility]     Final   Value: ACINETOBACTER CALCOACETICUS/BAUMANNII COMPLEX     Note: CORRECTED REPORT CALLED TO MARGARET BAUGHAM AT [facility] 04/09/13 1045     Performed at [facility]   Report Status PENDING   Incomplete  CULTURE, BLOOD (ROUTINE X 2)     Status: None   Collection Time    04/08/13 12:04 PM      Result Value Range Status   Specimen Description BLOOD RIGHT HAND   Final   Special Requests BOTTLES DRAWN AEROBIC AND ANAEROBIC 8CC   Final   Culture NO GROWTH 3 DAYS   Final   Report Status PENDING   Incomplete  CULTURE, BLOOD (ROUTINE X 2)     Status: None   Collection Time    04/08/13  4:27 PM      Result Value Range Status   Specimen Description BLOOD RIGHT HAND   Final   Special Requests BOTTLES DRAWN AEROBIC ONLY 6CC BOTTLE   Final   Culture NO GROWTH 3 DAYS   Final   Report Status PENDING   Incomplete      Lab Results  Component Value Date   CALCIUM 8.2* 04/11/2013   PHOS 3.0 04/11/2013       Impression: 1)Renal  ESRD on HD On MWF schedule Hx of NON compliance with HD.  Pt  being Dialyized today   2)CVS-   BP stable.   3)Anemia HGb not at goal (9--11) Hx  Of GI bleed ( hx of GAVE) Multiple transfusions  To recive Transfusion on HD  4)ID- Sepsis On ABX  5)Liver Hx of Alc Cirrhosis. Hepatic Encephalopathy  6)Electrolytes Normokalemic NOrmonatremic   7)Acid base Co2 at goal     Plan:  Will Continue current tx.        Roshelle Traub S 04/11/2013, 10:49 AM

## 2013-04-11 NOTE — Progress Notes (Signed)
1 UNIT PRBC STARTED. PT VERY DROWSY. VSS.

## 2013-04-11 NOTE — Progress Notes (Signed)
Notified Dr. York Spaniel at 1700 that pt had large BM with large amount of blood in stool. VS are stable at this time, but BP is running low, as it has been throughout the day. Order to transfuse 1 unit of PRBCs, GI consult, and transport pt to ICU. Report was called to Damon, RN in ICU. Pt transported by myself, and another Charity fundraiser. Sheryn Bison

## 2013-04-11 NOTE — Progress Notes (Addendum)
ANTIBIOTIC CONSULT NOTE - INITIAL  Pharmacy Consult for Primaxin Indication: bacteremia  Allergies  Allergen Reactions  . Tylenol [Acetaminophen] Other (See Comments)    cirrhosis   Patient Measurements: Height: 6\' 3"  (190.5 cm) Weight: 246 lb 0.5 oz (111.6 kg) IBW/kg (Calculated) : 84.5  Vital Signs: Temp: 98.3 F (36.8 C) (10/01 1330) Temp src: Oral (10/01 1330) BP: 100/48 mmHg (10/01 1330) Pulse Rate: 105 (10/01 1330) Intake/Output from previous day: 09/30 0701 - 10/01 0700 In: 640 [P.O.:640] Out: -  Intake/Output from this shift: Total I/O In: 325 [Blood:325] Out: 1284 [Other:1284]  Labs:  Recent Labs  04/09/13 0506 04/11/13 0520  WBC 8.6 6.7  HGB 8.3* 7.2*  PLT 124* 120*  CREATININE 6.08* 4.62*   Estimated Creatinine Clearance: 22.6 ml/min (by C-G formula based on Cr of 4.62). No results found for this basename: VANCOTROUGH, Leodis Binet, VANCORANDOM, GENTTROUGH, GENTPEAK, GENTRANDOM, TOBRATROUGH, TOBRAPEAK, TOBRARND, AMIKACINPEAK, AMIKACINTROU, AMIKACIN,  in the last 72 hours   Microbiology: Recent Results (from the past 720 hour(s))  MRSA PCR SCREENING     Status: Abnormal   Collection Time    03/19/13  3:00 AM      Result Value Range Status   MRSA by PCR POSITIVE (*) NEGATIVE Final   Comment:            The GeneXpert MRSA Assay (FDA     approved for NASAL specimens     only), is one component of a     comprehensive MRSA colonization     surveillance program. It is not     intended to diagnose MRSA     infection nor to guide or     monitor treatment for     MRSA infections.     RESULT CALLED TO, READ BACK BY AND VERIFIED WITH:     NEILSON,T @0530  ON 03/19/13 BY WOODIE,J  CULTURE, BLOOD (ROUTINE X 2)     Status: None   Collection Time    04/07/13 12:47 PM      Result Value Range Status   Specimen Description BLOOD RIGHT HAND   Final   Special Requests BOTTLES DRAWN AEROBIC ONLY 10CC   Final   Culture  Setup Time     Final   Value: 04/08/2013  20:10     Performed at [facility]   Culture     Final   Value: ACINETOBACTER CALCOACETICUS/BAUMANNII COMPLEX     Note: SUSCEPTIBILITIES PERFORMED ON PREVIOUS CULTURE WITHIN THE LAST 5 DAYS.     Note: CORRECTED REPORT CALLED TO MARGARET BAUGHAM 04/09/13 1045 BY SMITHERSJ     Performed at [facility]   Report Status 04/11/2013 FINAL   Final  CULTURE, BLOOD (ROUTINE X 2)     Status: None   Collection Time    04/07/13  1:02 PM      Result Value Range Status   Specimen Description BLOOD RIGHT HAND   Final   Special Requests BOTTLES DRAWN AEROBIC ONLY 10CC   Final   Culture  Setup Time     Final   Value: 04/08/2013 20:10     Performed at [facility]   Culture     Final   Value: ACINETOBACTER CALCOACETICUS/BAUMANNII COMPLEX     Note: COLISTIN 0.50ug/ml ETEST results for this drug are "FOR INVESTIGATIONAL USE ONLY" and should NOT be used for clinical purposes.     Note: CORRECTED REPORT CALLED TO MARGARET BAUGHAM AT [facility] 04/09/13 1045     Performed at  [facility]   Report Status 04/11/2013 FINAL   Final   Organism ID, Bacteria ACINETOBACTER CALCOACETICUS/BAUMANNII COMPLEX   Final  CULTURE, BLOOD (ROUTINE X 2)     Status: None   Collection Time    04/08/13 12:04 PM      Result Value Range Status   Specimen Description BLOOD RIGHT HAND   Final   Special Requests BOTTLES DRAWN AEROBIC AND ANAEROBIC 8CC   Final   Culture NO GROWTH 3 DAYS   Final   Report Status PENDING   Incomplete  CULTURE, BLOOD (ROUTINE X 2)     Status: None   Collection Time    04/08/13  4:27 PM      Result Value Range Status   Specimen Description BLOOD RIGHT HAND   Final   Special Requests BOTTLES DRAWN AEROBIC ONLY 6CC BOTTLE   Final   Culture NO GROWTH 3 DAYS   Final   Report Status PENDING   Incomplete    Medical History: Past Medical History  Diagnosis Date  . Rectal bleeding   . Dialysis care   . GI bleed   . Hypertension   . Esophageal varices   . Alcoholic liver  disease   . Hepatic encephalopathy   . Ascites   . Diabetes mellitus   . Detached retina   . Pneumonia   . 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 ESRD on hemodialysis     Medications:  Scheduled:  . feeding supplement  237 mL Oral BID BM  . folic acid  1 mg Oral Daily  . Gerhardt's butt cream   Topical BID  . imipenem-cilastatin  250 mg Intravenous Q12H  . insulin aspart  0-9 Units Subcutaneous TID WC  . lactulose  30 g Oral TID  . levothyroxine  100 mcg Oral QAC breakfast  . metoCLOPramide  5 mg Oral TID AC & HS  . pantoprazole  40 mg Oral BID AC  . sodium chloride  3 mL Intravenous Q12H   Assessment: 62yo male with positive blood cultures.  Acinetobacter bacteremia.  Pt with ESRD requiring dialysis.    ANTIBIOTICS: Primaxin 10/1 >> Cefotaxime 9/28 >> 10/1 Vancomycin 9/28 >> 9/30  Report Status 04/11/2013 FINAL    Organism ID, Bacteria ACINETOBACTER CALCOACETICUS/BAUMANNII COMPLEX   Resulting Agency [facility]   Culture & Susceptibility    Antibiotic  Organism Organism Organism     ACINETOBACTER CALCOACETICUS/BAUMANNII COMPLEX     AMIKACIN  <=16 SENSITIVE S Final       AMPICILLIN/SULBACTAM  >=32 RESISTANT R Final       CEFEPIME  16 INTERMEDIATE I Final       CEFTAZIDIME  >=64 RESISTANT R Final       CEFTRIAXONE  >=64 RESISTANT R Final       CIPROFLOXACIN  >=4 RESISTANT R Final       GENTAMICIN  >=16 RESISTANT R Final       IMIPENEM  1 SENSITIVE S Final       TOBRAMYCIN  8 INTERMEDIATE I Final       TRIMETH/SULFA  >=320 RESISTANT R Final       Goal of Therapy:  Eradicate infection.  Plan:  Primaxin 250mg  IV q12hrs (dialysis dosing) Monitor labs, cultures, and progress  Margo Aye, Gerline Ratto A 04/11/2013,1:50 PM

## 2013-04-12 ENCOUNTER — Encounter (HOSPITAL_COMMUNITY)
Admission: EM | Disposition: A | Discharge status: Skilled Nursing Facility | Payer: Medicare Other | Source: Home / Self Care | Attending: Internal Medicine

## 2013-04-12 ENCOUNTER — Inpatient Hospital Stay (HOSPITAL_COMMUNITY): Payer: Medicare Other

## 2013-04-12 ENCOUNTER — Encounter (HOSPITAL_COMMUNITY): Payer: Self-pay

## 2013-04-12 DIAGNOSIS — K31819 Angiodysplasia of stomach and duodenum without bleeding: Secondary | ICD-10-CM

## 2013-04-12 DIAGNOSIS — D649 Anemia, unspecified: Secondary | ICD-10-CM

## 2013-04-12 DIAGNOSIS — K922 Gastrointestinal hemorrhage, unspecified: Secondary | ICD-10-CM

## 2013-04-12 HISTORY — PX: ESOPHAGOGASTRODUODENOSCOPY: SHX5428

## 2013-04-12 LAB — CBC
HCT: 27.6 % — ABNORMAL LOW (ref 39.0–52.0)
MCHC: 33 g/dL (ref 30.0–36.0)
MCV: 92.6 fL (ref 78.0–100.0)
Platelets: 100 10*3/uL — ABNORMAL LOW (ref 150–400)
RDW: 17.7 % — ABNORMAL HIGH (ref 11.5–15.5)
WBC: 8.5 10*3/uL (ref 4.0–10.5)

## 2013-04-12 LAB — BODY FLUID CELL COUNT WITH DIFFERENTIAL

## 2013-04-12 LAB — HEMOGLOBIN AND HEMATOCRIT, BLOOD
HCT: 27.4 % — ABNORMAL LOW (ref 39.0–52.0)
Hemoglobin: 9.1 g/dL — ABNORMAL LOW (ref 13.0–17.0)
Hemoglobin: 8.6 g/dL — ABNORMAL LOW (ref 13.0–17.0)

## 2013-04-12 LAB — GLUCOSE, CAPILLARY
Glucose-Capillary: 215 mg/dL — ABNORMAL HIGH (ref 70–99)
Glucose-Capillary: 252 mg/dL — ABNORMAL HIGH (ref 70–99)

## 2013-04-12 SURGERY — EGD (ESOPHAGOGASTRODUODENOSCOPY)
Anesthesia: Moderate Sedation

## 2013-04-12 MED ORDER — STERILE WATER FOR IRRIGATION IR SOLN
Status: DC | PRN
  Administered 2013-04-12: 16:00:00

## 2013-04-12 MED ORDER — SODIUM CHLORIDE 0.9 % IV SOLN: 100.0000 mL | INTRAVENOUS | Status: DC | PRN

## 2013-04-12 MED ORDER — SUCRALFATE 1 GM/10ML PO SUSP
1.0000 g | Freq: Three times a day (TID) | ORAL | Status: DC
  Administered 2013-04-12 – 2013-04-14 (×6): 1 g via ORAL
  Filled 2013-04-12 (×9): qty 10

## 2013-04-12 MED ORDER — BUTAMBEN-TETRACAINE-BENZOCAINE 2-2-14 % EX AERO
INHALATION_SPRAY | CUTANEOUS | Status: DC | PRN
  Administered 2013-04-12: 2 via TOPICAL

## 2013-04-12 MED ORDER — HEPARIN SODIUM (PORCINE) 1000 UNIT/ML DIALYSIS
1000.0000 [IU] | INTRAMUSCULAR | Status: DC | PRN
  Filled 2013-04-12: qty 1

## 2013-04-12 MED ORDER — MIDAZOLAM HCL 5 MG/5ML IJ SOLN
INTRAMUSCULAR | Status: AC
  Filled 2013-04-12: qty 10

## 2013-04-12 MED ORDER — MEPERIDINE HCL 50 MG/ML IJ SOLN
INTRAMUSCULAR | Status: AC
  Filled 2013-04-12: qty 1

## 2013-04-12 MED ORDER — MEPERIDINE HCL 50 MG/ML IJ SOLN
INTRAMUSCULAR | Status: DC | PRN
  Administered 2013-04-12: 20 mg via INTRAVENOUS

## 2013-04-12 MED ORDER — MIDAZOLAM HCL 5 MG/5ML IJ SOLN
INTRAMUSCULAR | Status: DC | PRN
  Administered 2013-04-12: 1 mg via INTRAVENOUS
  Administered 2013-04-12 (×2): 2 mg via INTRAVENOUS

## 2013-04-12 MED ORDER — SODIUM CHLORIDE 0.9 % IV SOLN
INTRAVENOUS | Status: DC
  Administered 2013-04-12: 15:00:00 via INTRAVENOUS

## 2013-04-12 NOTE — Op Note (Signed)
EGD PROCEDURE REPORT  PATIENT:  Timothy Chan  MR#:  [id number] Birthdate:  1950-09-18, 62 y.o., male Endoscopist:  Dr. Malissa Hippo, MD  Procedure Date: 04/12/2013  Procedure:   EGD  Indications:  Patient is 62 year old Caucasian male with multiple medical problems including advanced cirrhosis with recurrent upper GI bleed. He's undergone multiple session of APC with limited success. He is back again with hematemesis and melena. He's also been treated for Acinetobacter calcoaceticus sepsis. His hypotension has resolved. He is undergoing EGD with therapeutic intention.            Informed Consent:  The risks, benefits, alternatives & imponderables which include, but are not limited to, bleeding, infection, perforation, drug reaction and potential missed lesion have been reviewed.  The potential for biopsy, lesion removal, esophageal dilation, etc. have also been discussed.  Questions have been answered.  All parties agreeable.  Please see history & physical in medical record for more information.  Medications:  Demerol 20 mg IV Versed 5mg  IV Cetacaine spray topically for oropharyngeal anesthesia  Description of procedure:  The endoscope was introduced through the mouth and advanced to the second portion of the duodenum without difficulty or limitations. The mucosal surfaces were surveyed very carefully during advancement of the scope and upon withdrawal.  Findings:  Esophagus:  Mucosa of the esophagus was normal. No varices or other abnormalities noted. GEJ:  43 cm Stomach:  There was fresh blood in his clotted gastric body. Fresh bleeding was traced into place is in the distal stomach. One was on the distal aspect of angularis and the other one in the prepyloric region. He had multiple telangiectasia at antrum. Mucosa gastric body and fundus revealed snakeskinning or mosaic pattern. No fundal varices are present. Pyloric channel was patent. Duodenum:  Bulbar and post bulbar mucosa was  normal.  Therapeutic/Diagnostic Maneuvers Performed:  Antral and prepyloric telangiectasia with active bleeding with treated with argon plasma coagulator with hemostasis.  Complications:  None  Impression: No evidence of esophageal or gastric varices. Portal gastropathy. Multiple telangiectasia at gastric antrum with two with active bleeding were treated with APC and bleeding controlled. Given his clinical course of the last few months the risk of rebleed is very high.  Recommendations:  Sucralfate 1 g a.c. and each bedtime. Lab studies in a.m.  Anaika Santillano U  04/12/2013  4:44 PM  CC: Dr. Ignatius Specking., MD & Dr. Bonnetta Barry ref. provider found

## 2013-04-12 NOTE — Clinical Social Work Note (Signed)
Reviewed chart and updated [facility] on pt. Willing to accept pt when stable, including weekend. CSW will continue to follow. Facility aware of need for IV antibiotics at dialysis for 2 weeks.   Derenda Fennel, [place] [phone]

## 2013-04-12 NOTE — Progress Notes (Signed)
Report given to Scherrie Merritts, RN. Patient being transferred to dept 300. Patient will be transported to dept 300 after going to [place]. Patient alert, oriented and in stable condition at the time of transport to [place].

## 2013-04-12 NOTE — Progress Notes (Signed)
Subjective: Interval History: Patient is somnolent but arousable. Presently denies any difficulty breathing. He said he's feeling okay. Objective: Vital signs in last 24 hours: Temp:  [97.7 F (36.5 C)-98.9 F (37.2 C)] 98 F (36.7 C) (10/02 0730) Pulse Rate:  [91-112] 103 (10/02 0600) Resp:  [8-19] 12 (10/02 0700) BP: (86-123)/(42-62) 109/54 mmHg (10/02 0700) SpO2:  [93 %-100 %] 100 % (10/02 0600) Weight:  [112.8 kg (248 lb 10.9 oz)] 112.8 kg (248 lb 10.9 oz) (10/02 0500) Weight change: 0 kg (0 lb)  Intake/Output from previous day: 10/01 0701 - 10/02 0700 In: 1348 [I.V.:423; Blood:725; IV Piggyback:200] Out: 1286 [Stool:2] Intake/Output this shift:    Patient generally he is somnolent. He was arousable. He doesn't sustained continuous conversation. In between the discussion patient sleeps. Presently still he wants to continue with dialysis. Chest is clear to auscultation His heart exam regular rate and rhythm Abdomen is full with mild tenderness. Extremities he has 1+ edema  Lab Results:  Recent Labs  04/11/13 0520 04/11/13 2230 04/12/13 0535  WBC 6.7  --  8.5  HGB 7.2* 9.1* 9.1*  HCT 22.2* 26.9* 27.6*  PLT 120*  --  100*   BMET:   Recent Labs  04/11/13 0520  NA 135  K 4.6  CL 101  CO2 28  GLUCOSE 171*  BUN 33*  CREATININE 4.62*  CALCIUM 8.2*   No results found for this basename: PTH,  in the last 72 hours Iron Studies:  No results found for this basename: IRON, TIBC, TRANSFERRIN, FERRITIN,  in the last 72 hours  Studies/Results: No results found.  I have reviewed the patient's current medications.  Assessment/Plan: Problem #1 end-stage as this is status post hemodialysis dialysis yesterday. His BUN is 33 creatinine is 4.62 with potassium of 4.6. Problem #2 anemia presently patient is status post blood transfusion his hemoglobin and hematocrit has improved. Problem #3 history of diabetes. Reasonably controlled Problem #4 history of liver  cirrhosis/alcholic Problem #5 history of hepatic encephalopathy. Patient presently somelent but arousable. This could be secondary to sepsis. Problem #6 metabolic bone disease his calcium and phosphorus is with  in acceptable range. Presently he is  off his  binder Problem #7 history of GI bleeding.Reqiuring repeated blood transfusion Problem #8 Actinobacter infection. Patient on imipenem and  Presently afebrile with normal white blood cell count. Plan: We'll make arrangements for patient to get dialysis tomorrow We'll check his basic metabolic panel,  CBC in the morning. Patient overall was poor prognosis.     LOS: 8 days   Chaska Hagger S 04/12/2013,10:31 AM

## 2013-04-12 NOTE — Consult Note (Signed)
Reason for Consult: Melena Referring Physician: Hospi8talist services  Timothy Chan is an 62 y.o. male.  HPI: Admitted thru the ED 03/04/2013 after apparently falling out of bed at [facility]. Hx of alcoholic cirrhosis, ESRD and receives hemodialysis on M-W-F.  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 Two EGD at [facility] in August. He was transferred [facility]  In September to Dr.Jason Conway's GI.   Yesterday he apparently had a large stools that was noted to be blood. Transferred to [facility] yesterday and received 1 unit of PRBCs. This am his stool was black.  Positive blood cultures:  ACINETOBACTER CALCOACETICUS/BAUMANNII COMPLEX and covered with Primaxin 250mg  BID.     Patient is very drowsy this am.   03/09/2013 EGD:  mpression:  No evidence of dislocation or gastric varices.  Portal gastropathy.  Multiple gastric antral vascular ectasia. Several of these lesions were treated.   CBC    Component Value Date/Time   WBC 8.5 04/12/2013 0535   RBC 2.98* 04/12/2013 0535   HGB 9.1* 04/12/2013 0535   HCT 27.6* 04/12/2013 0535   PLT 100* 04/12/2013 0535   MCV 92.6 04/12/2013 0535   MCH 30.5 04/12/2013 0535   MCHC 33.0 04/12/2013 0535   RDW 17.7* 04/12/2013 0535   LYMPHSABS 1.4 04/04/2013 1407   MONOABS 1.2* 04/04/2013 1407   EOSABS 0.7 04/04/2013 1407   BASOSABS 0.1 04/04/2013 1407        Past Medical History  Diagnosis Date  . Rectal bleeding   . Dialysis care   . GI bleed   . Hypertension   . Esophageal varices   . Alcoholic liver disease   . Hepatic encephalopathy   . Ascites   . Diabetes mellitus   . Detached retina   . Pneumonia   . 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 ESRD on hemodialysis     Past Surgical History  Procedure Laterality Date  . Esophagogastroduodenoscopy  12/31/2010    EGD [facility] THERAPY  . Esophagogastroduodenoscopy  05/31/2012E    GD [facility] ABLATION   . Small bowel givens  12/01/2010  . Colonoscopy  09/07/2010  . Esophagogastroduodenoscopy  09/05/2010    OUTLAW  . Lung surgery  6/11    [place]  . Esophagogastroduodenoscopy  03/26/2011    Procedure: ESOPHAGOGASTRODUODENOSCOPY (EGD);  Surgeon: Malissa Hippo, MD;  [facility];  Service: Endoscopy;  Laterality: N/A;  8:30 am  . Hot hemostasis  03/26/2011    Procedure: HOT HEMOSTASIS (ARGON PLASMA COAGULATION/BICAP);  Surgeon: Malissa Hippo, MD;  Location[facility];  Service: Endoscopy;  Laterality: N/A;  . Stent in liver    . Esophagogastroduodenoscopy N/A 10/24/2012    Procedure: ESOPHAGOGASTRODUODENOSCOPY (EGD);  Surgeon: Malissa Hippo, MD;  Location[facility];  Service: Endoscopy;  Laterality: N/A;  . Esophagogastroduodenoscopy N/A 11/22/2012    Procedure: ESOPHAGOGASTRODUODENOSCOPY (EGD);  Surgeon: Charna Elizabeth, MD;  Location: [facility] ENDOSCOPY;  Service: Endoscopy;  Laterality: N/A;  . Paracentesis    . Esophagogastroduodenoscopy N/A 12/21/2012    Procedure: ESOPHAGOGASTRODUODENOSCOPY (EGD);  Surgeon: Malissa Hippo, MD;  [facility];  Service: Endoscopy;  Laterality: N/A;  250-moved to 155 Ann to notify pt  . Hot hemostasis N/A 12/21/2012    Procedure: HOT HEMOSTASIS (ARGON PLASMA COAGULATION/BICAP);  Surgeon: Malissa Hippo, MD;  [facility];  Service: Endoscopy;  Laterality:  N/A;  . Esophagogastroduodenoscopy N/A 01/25/2013    Procedure: ESOPHAGOGASTRODUODENOSCOPY (EGD);  Surgeon: Malissa Hippo, MD;  [facility];  Service: Endoscopy;  Laterality: N/A;  100  . Hot hemostasis N/A 01/25/2013    Procedure: HOT HEMOSTASIS (ARGON PLASMA COAGULATION/BICAP);  Surgeon: Malissa Hippo, MD;  [facility];  Service: Endoscopy;  Laterality: N/A;  . Esophagogastroduodenoscopy N/A 02/16/2013    Procedure: ESOPHAGOGASTRODUODENOSCOPY (EGD);  Surgeon: Malissa Hippo, MD;  [facility];  Service: Endoscopy;   Laterality: N/A;  . Esophagogastroduodenoscopy N/A 03/07/2013    Procedure: ESOPHAGOGASTRODUODENOSCOPY (EGD);  Surgeon: Malissa Hippo, MD;  [facility];  Service: Endoscopy;  Laterality: N/A;  . Esophagogastroduodenoscopy N/A 03/09/2013    Procedure: ESOPHAGOGASTRODUODENOSCOPY (EGD);  Surgeon: Malissa Hippo, MD;  [facility];  Service: Endoscopy;  Laterality: N/A;    Family History  Problem Relation Age of Onset  . Kidney failure Neg Hx   . CAD Father   . High blood pressure Brother   . Gout Brother     Social History:  reports that he quit smoking about 20 months ago. His smoking use included Cigarettes. He has a 30 pack-year smoking history. He quit smokeless tobacco use about 11 months ago. He reports that he does not drink alcohol or use illicit drugs.  Allergies:  Allergies  Allergen Reactions  . Tylenol [Acetaminophen] Other (See Comments)    cirrhosis    Medications: I have reviewed the patient's current medications.  Results for orders placed during the hospital encounter of 04/04/13 (from the past 48 hour(s))  GLUCOSE, CAPILLARY     Status: Abnormal   Collection Time    04/10/13 11:32 AM      Result Value Range   Glucose-Capillary 158 (*) 70 - 99 mg/dL   Comment 1 Notify RN     Comment 2 Documented in Chart    GLUCOSE, CAPILLARY     Status: Abnormal   Collection Time    04/10/13  4:27 PM      Result Value Range   Glucose-Capillary 230 (*) 70 - 99 mg/dL   Comment 1 Notify RN     Comment 2 Documented in Chart    GLUCOSE, CAPILLARY     Status: Abnormal   Collection Time    04/10/13 10:00 PM      Result Value Range   Glucose-Capillary 178 (*) 70 - 99 mg/dL   Comment 1 Notify RN    CBC     Status: Abnormal   Collection Time    04/11/13  5:20 AM      Result Value Range   WBC 6.7  4.0 - 10.5 K/uL   RBC 2.39 (*) 4.22 - 5.81 MIL/uL   Hemoglobin 7.2 (*) 13.0 - 17.0 g/dL   HCT 16.1 (*) 09.6 - 04.5 %   MCV 92.9  78.0 - 100.0 fL   MCH 30.1   26.0 - 34.0 pg   MCHC 32.4  30.0 - 36.0 g/dL   RDW 40.9 (*) 81.1 - 91.4 %   Platelets 120 (*) 150 - 400 K/uL  BASIC METABOLIC PANEL     Status: Abnormal   Collection Time    04/11/13  5:20 AM      Result Value Range   Sodium 135  135 - 145 mEq/L   Potassium 4.6  3.5 - 5.1 mEq/L   Chloride 101  96 - 112 mEq/L   CO2 28  19 - 32  mEq/L   Glucose, Bld 171 (*) 70 - 99 mg/dL   BUN 33 (*) 6 - 23 mg/dL   Creatinine, Ser 6.96 (*) 0.50 - 1.35 mg/dL   Calcium 8.2 (*) 8.4 - 10.5 mg/dL   GFR calc non Af Amer 12 (*) >90 mL/min   GFR calc Af Amer 14 (*) >90 mL/min   Comment: (NOTE)     The eGFR has been calculated using the CKD EPI equation.     This calculation has not been validated in all clinical situations.     eGFR's persistently <90 mL/min signify possible Chronic Kidney     Disease.  PHOSPHORUS     Status: None   Collection Time    04/11/13  5:20 AM      Result Value Range   Phosphorus 3.0  2.3 - 4.6 mg/dL  GLUCOSE, CAPILLARY     Status: Abnormal   Collection Time    04/11/13  7:37 AM      Result Value Range   Glucose-Capillary 196 (*) 70 - 99 mg/dL   Comment 1 Notify RN    HEPATITIS B SURFACE ANTIGEN     Status: None   Collection Time    04/11/13  8:13 AM      Result Value Range   Hepatitis B Surface Ag NEGATIVE  NEGATIVE   Comment: Performed at [facility]  PREPARE RBC (CROSSMATCH)     Status: None   Collection Time    04/11/13 10:10 AM      Result Value Range   Order Confirmation ORDER PROCESSED BY BLOOD BANK    TYPE AND SCREEN     Status: None   Collection Time    04/11/13 10:12 AM      Result Value Range   ABO/RH(D) B NEG     Antibody Screen NEG     Sample Expiration 04/14/2013     Unit Number [id number]     Blood Component Type RED CELLS,LR     Unit division 00     Status of Unit ISSUED,FINAL     Transfusion Status OK TO TRANSFUSE     Crossmatch Result Compatible     Unit Number [id number]     Blood Component Type RBC LR PHER1     Unit  division 00     Status of Unit ISSUED,FINAL     Transfusion Status OK TO TRANSFUSE     Crossmatch Result Compatible     Unit Number [id number]     Blood Component Type RED CELLS,LR     Unit division 00     Status of Unit ALLOCATED     Transfusion Status OK TO TRANSFUSE     Crossmatch Result Compatible    GLUCOSE, CAPILLARY     Status: Abnormal   Collection Time    04/11/13 12:12 PM      Result Value Range   Glucose-Capillary 163 (*) 70 - 99 mg/dL   Comment 1 Notify RN    GLUCOSE, CAPILLARY     Status: Abnormal   Collection Time    04/11/13  5:14 PM      Result Value Range   Glucose-Capillary 150 (*) 70 - 99 mg/dL   Comment 1 Notify RN    PREPARE RBC (CROSSMATCH)     Status: None   Collection Time    04/11/13  5:30 PM      Result Value Range   Order Confirmation ORDER PROCESSED BY BLOOD BANK    GLUCOSE, CAPILLARY  Status: Abnormal   Collection Time    04/11/13  9:06 PM      Result Value Range   Glucose-Capillary 124 (*) 70 - 99 mg/dL   Comment 1 Documented in Chart     Comment 2 Notify RN    HEMOGLOBIN AND HEMATOCRIT, BLOOD     Status: Abnormal   Collection Time    04/11/13 10:30 PM      Result Value Range   Hemoglobin 9.1 (*) 13.0 - 17.0 g/dL   Comment: DELTA CHECK NOTED     POST TRANSFUSION SPECIMEN   HCT 26.9 (*) 39.0 - 52.0 %  CBC     Status: Abnormal   Collection Time    04/12/13  5:35 AM      Result Value Range   WBC 8.5  4.0 - 10.5 K/uL   RBC 2.98 (*) 4.22 - 5.81 MIL/uL   Hemoglobin 9.1 (*) 13.0 - 17.0 g/dL   HCT 57.8 (*) 46.9 - 62.9 %   MCV 92.6  78.0 - 100.0 fL   MCH 30.5  26.0 - 34.0 pg   MCHC 33.0  30.0 - 36.0 g/dL   RDW 52.8 (*) 41.3 - 24.4 %   Platelets 100 (*) 150 - 400 K/uL   Comment: SPECIMEN CHECKED FOR CLOTS     PLATELET COUNT CONFIRMED BY SMEAR  GLUCOSE, CAPILLARY     Status: Abnormal   Collection Time    04/12/13  7:23 AM      Result Value Range   Glucose-Capillary 142 (*) 70 - 99 mg/dL   Comment 1 Documented in Chart     Comment  2 Notify RN      No results found.  ROS Blood pressure 109/54, pulse 103, temperature 98 F (36.7 C), temperature source Axillary, resp. rate 12, height 6\' 3"  (1.905 m), weight 248 lb 10.9 oz (112.8 kg), SpO2 100.00%. Physical Exam  Very drowsy. Sinus tach on monitor. Color pale. Lung clear but shallow. Abdomen distended but not tense. BS+. Edema to scrotum and upper thighs..  Assessment/Plan: Upper GI bleed, probably from GAVE. Hemoglobin this am 9.1. He has been transfused x 1 yesterday. 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  ESRD on hemodialysis. Plan: I will discuss with Dr. Karilyn Cota.  Change diet to clear liquids   SETZER,TERRI W 04/12/2013, 8:48 AM    GI attending note; Patient interviewed and examined. He is complaining of abdominal pain and has more or less generalized tenderness. Therefore, the source of his sepsis maybe SBP. Patient hasn't vomited any more blood request of his upper GI tract the reevaluated and therapy rendered as appropriate. Recommendations; Diagnostic abdominal paracentesis. EGD later today.

## 2013-04-12 NOTE — Progress Notes (Signed)
Returned 5mg  Versed to [facility], [facility], locked in night pyxis. Scanned to pyxis, but drawer did not open for me to return. Gave 5mg  Versed. Pulled at total of 10mg  Versed.

## 2013-04-12 NOTE — Progress Notes (Signed)
[facility] chart review completed.  

## 2013-04-12 NOTE — Procedures (Signed)
PreOperative Dx: Sepsis, ascites, ? SBP Postoperative Dx: Sepsis, ascites, ? SBP Procedure:   US guided diagnostic  paracentesis Radiologist:  Tyron Russell Anesthesia:  10 ml of 1% lidocaine Specimen:  120 ml of yellow ascitic fluid EBL:   < 1 ml Complications: None

## 2013-04-12 NOTE — Progress Notes (Signed)
Paracentesis complete no signs of distress. clear yellow abdominal fluid removed.

## 2013-04-12 NOTE — Progress Notes (Signed)
[facility] PROGRESS NOTE  TRYSTEN BERNARD ZOX:[id number] DOB: 1950/08/14 DOA: 04/04/2013 PCP: Ignatius Specking., MD   62 y.o. 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 Karilyn Cota, HTN, and T2DM who presents with fall with right upper quadrant pain found to have bacteremia and GIB  Assessment/Plan  1. Acinetobacter bacteremia (9/27):  Afebrile; -  blood cultures from 9/28 NGTD -  Acinetobacter sens to imipenem; d/w Dr. Janina Mayo ID; who reccommended to complete antibiotic that can be given during HD to complete a 14-day course   2. Abdominal pain:  CT scan demonstrated anasarca with asymmetric left flank edema which may represent hematoma, small volume ascites, stable chronic right pleural effusion, indicentally noticed chronic calcific pancreatitis and bilateral renal atrophy.  TIPS appeared stable.  On 9/26, abdominal pain increased.   -Attempted paracentesis, however, there was not enough fluid to complete the procedure. Tx empirically for SBP.  Pain improving.   -  LFTs stable to improved.KUB unremarkable IV atx   3. Alcoholic cirrhosis.  Low salt diet.  Fluid balance managed with HD. -s/p EGD (11/2012): portal hypertensive gastropathy, vascular ectasia; esophagitis  -Hepatic encephalopathy, ammonia level 132 down to 50s and more alert today; Resume lactulose TID  4. Acute blood loss anemia, differential diagnoses includes acute on chronic GI bleeding from GAVE, portal hypertensive gastropathy. Occult stool positive. Transfused a total of 3 units of PRBC and hgb increased from 6.4 to 8.8mg /dl.   - had hematochezia post HD on 10/1; hypotensive tachycardia TF to ICU - TFsed two more units on 10/1; HG improved to 9.1Continue PPI;  - d/w GI Dr. Karilyn Cota who kindly agreed to f/u on  the patient for possible endoscopy   5. Hypothyroidism.  Started synthroid.  F/u TSH level in about 4 weeks.  6. End-stage renal disease.  Continue epo and iron with HD.   7. Severe protein calorie malnutrition.   Low salt diet with supplements   8. HTN, blood pressures low normal and hypoTN with HD recently;  Avoid medications to lower BP   9. Atypical chest pain, may have anginal pain. Patient has risk factors, including HTN, HLD, hx of tobacco use, hx of DM, and ESRD.  - If recurrent, consider cardiology consult for cath    11. Hx of DM, last A1c in 4 range a few months ago.   12. Thrombocytopenia, plt stable.  Secondary to cirrhosis.   D/w patient, and called d/w Rigor,Ray Son [phone];  -informed about ongoing acute medical issues; patient, family still wants full code but may decide to withdrawal of care in future  -prognosis is poor;   Diet: Low salt per patient request  Access: Fistula, PIV  IVF: OFF  Proph: SCDs   Code Status: full code  Family Communication: spoke with patient alone  Disposition Plan:  SNF may be 48 hours if stable   Consultants:  Nephrology  Wound care  GI  Procedures:  PRBC x 1 on 9/24  PRBC x 2 on 9/25  CT abd/pelvis 9/24  Antibiotics:  None  HPI/Subjective:  Patient is more awake today, sitting up to eat breakfast, still slow to answer questions and occasionally drifts off.        Objective: Filed Vitals:   04/12/13 0530 04/12/13 0545 04/12/13 0600 04/12/13 0700  BP:   109/55 109/54  Pulse:   103   Temp:      TempSrc:      Resp: 13 19 13 12   Height:      Weight:      SpO2:   100%     Intake/Output Summary (Last 24 hours) at 04/12/13 0813 Last data filed at 04/12/13 0700  Gross per 24 hour  Intake   1348 ml  Output   1286 ml  Net     62 ml   Filed Weights   04/11/13 0419 04/11/13 0915 04/12/13 0500  Weight: 111.6 kg (246 lb 0.5 oz) 111.6 kg (246 lb 0.5 oz) 112.8 kg (248 lb 10.9 oz)     Exam:   General:  Cachectic CM, No acute distress, more alert today   HEENT:  NCAT, MMM  Cardiovascular:  RRR, nl S1, S2 no mrg, 2+ pulses, warm extremities  Respiratory:  CTAB, no increased WOB  Abdomen:   NABS, soft, TTP diffusely without rebound or guarding, moderately distended  MSK:   Normal tone and bulk, Diffuse anasarca with dependent edema  Neuro:  Awake and able to stay awake today for conversation  Data Reviewed: Basic Metabolic Panel:  Recent Labs Lab 04/06/13 0427 04/07/13 0550 04/08/13 0536 04/09/13 0506 04/11/13 0520  NA 135 134* 134* 139 135  K 4.3 4.6 4.5 4.4 4.6  CL 101 99 100 104 101  CO2 28 25 28 27 28   GLUCOSE 116* 144* 120* 134* 171*  BUN 30* 39* 42* 48* 33*  CREATININE 4.75* 5.39* 5.47* 6.08* 4.62*  CALCIUM 8.0* 8.2* 8.6 8.7 8.2*  PHOS 2.5  --   --   --  3.0   Liver Function Tests:  Recent Labs Lab 04/07/13 0704 04/08/13 1203  AST 29 29  ALT 11 10  ALKPHOS 144* 150*  BILITOT 2.0* 1.4*  PROT 5.5* 5.9*  ALBUMIN 1.2* 1.2*    Recent Labs Lab 04/07/13 0704  LIPASE 34    Recent Labs Lab 04/06/13 0954 04/07/13 1247 04/08/13 1204  AMMONIA 132* 52 56   CBC:  Recent Labs Lab 04/07/13 0550 04/08/13 0536 04/09/13 0506 04/11/13 0520 04/11/13 2230 04/12/13 0535  WBC 14.4* 12.7* 8.6 6.7  --  8.5  HGB 9.0* 8.5* 8.3* 7.2* 9.1* 9.1*  HCT 27.4* 25.8* 25.5* 22.2* 26.9* 27.6*  MCV 92.3 92.8 93.1 92.9  --  92.6  PLT 130* 125* 124* 120*  --  100*   Cardiac Enzymes: No results found for this basename: CKTOTAL, CKMB, CKMBINDEX, TROPONINI,  in the last 168 hours BNP (last 3 results)  Recent Labs  11/21/12 1145  PROBNP 1167.0*   CBG:  Recent Labs Lab 04/11/13 0737 04/11/13 1212 04/11/13 1714 04/11/13 2106 04/12/13 0723  GLUCAP 196* 163* 150* 124* 142*    Recent Results (from the past 240 hour(s))  CULTURE, BLOOD (ROUTINE X 2)     Status: None   Collection Time    04/07/13 12:47 PM      Result Value Range Status    Specimen Description BLOOD RIGHT HAND   Final   Special Requests BOTTLES DRAWN AEROBIC ONLY 10CC   Final   Culture  Setup Time     Final   Value: 04/08/2013 20:10     Performed at [facility]   Culture     Final   Value: ACINETOBACTER CALCOACETICUS/BAUMANNII COMPLEX     Note: SUSCEPTIBILITIES PERFORMED ON PREVIOUS CULTURE WITHIN THE LAST 5 DAYS.     Note: CORRECTED REPORT CALLED TO  MARGARET BAUGHAM 04/09/13 1045 BY SMITHERSJ     Performed at [facility]   Report Status 04/11/2013 FINAL   Final  CULTURE, BLOOD (ROUTINE X 2)     Status: None   Collection Time    04/07/13  1:02 PM      Result Value Range Status   Specimen Description BLOOD RIGHT HAND   Final   Special Requests BOTTLES DRAWN AEROBIC ONLY 10CC   Final   Culture  Setup Time     Final   Value: 04/08/2013 20:10     Performed at [facility]   Culture     Final   Value: ACINETOBACTER CALCOACETICUS/BAUMANNII COMPLEX     Note: COLISTIN 0.50ug/ml ETEST results for this drug are "FOR INVESTIGATIONAL USE ONLY" and should NOT be used for clinical purposes.     Note: CORRECTED REPORT CALLED TO MARGARET BAUGHAM AT [facility] 04/09/13 1045     Performed at [facility]   Report Status 04/11/2013 FINAL   Final   Organism ID, Bacteria ACINETOBACTER CALCOACETICUS/BAUMANNII COMPLEX   Final  CULTURE, BLOOD (ROUTINE X 2)     Status: None   Collection Time    04/08/13 12:04 PM      Result Value Range Status   Specimen Description BLOOD RIGHT HAND   Final   Special Requests BOTTLES DRAWN AEROBIC AND ANAEROBIC 8CC   Final   Culture NO GROWTH 3 DAYS   Final   Report Status PENDING   Incomplete  CULTURE, BLOOD (ROUTINE X 2)     Status: None   Collection Time    04/08/13  4:27 PM      Result Value Range Status   Specimen Description BLOOD RIGHT HAND   Final   Special Requests BOTTLES DRAWN AEROBIC ONLY 6CC BOTTLE   Final   Culture NO GROWTH 3 DAYS   Final   Report Status PENDING   Incomplete      Studies: No results found.  Scheduled Meds: . feeding supplement  237 mL Oral BID BM  . folic acid  1 mg Oral Daily  . Gerhardt's butt cream   Topical BID  . imipenem-cilastatin  250 mg Intravenous Q12H  . insulin aspart  0-9 Units Subcutaneous TID WC  . lactulose  30 g Oral TID  . levothyroxine  100 mcg Oral QAC breakfast  . metoCLOPramide  5 mg Oral TID AC & HS  . pantoprazole (PROTONIX) IV  40 mg Intravenous Q12H  . sodium chloride  3 mL Intravenous Q12H   Continuous Infusions:   Principal Problem:   Sepsis due to Acinetobacter species Active Problems:   Alcoholic cirrhosis/remote TIPS   Diabetes mellitus type 2, uncontrolled, with complications   CKD (chronic kidney disease) stage V requiring chronic dialysis   Acute blood loss anemia   GI bleed   Abdominal pain   Thrombocytopenia, unspecified   GAVE (gastric antral vascular ectasia)   Protein-calorie malnutrition, severe   Chest pain   Encephalopathy, hepatic    Time spent: 30 min    Esperanza Sheets  [facility] Pager [phone]. If 7PM-7AM, please contact night-coverage at [email or web address], password [facility] 04/12/2013, 8:13 AM  LOS: 8 days

## 2013-04-12 NOTE — Progress Notes (Signed)
Preliminary results ascitic fluid analysis noted. Clear fluid with WBC of 2; he therefore does not have SBP.

## 2013-04-13 LAB — GLUCOSE, CAPILLARY
Glucose-Capillary: 258 mg/dL — ABNORMAL HIGH (ref 70–99)
Glucose-Capillary: 243 mg/dL — ABNORMAL HIGH (ref 70–99)
Glucose-Capillary: 281 mg/dL — ABNORMAL HIGH (ref 70–99)
Glucose-Capillary: 345 mg/dL — ABNORMAL HIGH (ref 70–99)

## 2013-04-13 LAB — CULTURE, BLOOD (ROUTINE X 2)
Culture: NO GROWTH
Culture: NO GROWTH

## 2013-04-13 LAB — BASIC METABOLIC PANEL
BUN: 32 mg/dL — ABNORMAL HIGH (ref 6–23)
Calcium: 8.5 mg/dL (ref 8.4–10.5)
GFR calc Af Amer: 17 mL/min — ABNORMAL LOW (ref >90)
GFR calc non Af Amer: 15 mL/min — ABNORMAL LOW (ref >90)
Glucose, Bld: 252 mg/dL — ABNORMAL HIGH (ref 70–99)
Sodium: 137 mEq/L (ref 135–145)

## 2013-04-13 LAB — HEMOGLOBIN AND HEMATOCRIT, BLOOD
HCT: 23.9 % — ABNORMAL LOW (ref 39.0–52.0)
Hemoglobin: 7.9 g/dL — ABNORMAL LOW (ref 13.0–17.0)

## 2013-04-13 LAB — PHOSPHORUS: Phosphorus: 2.8 mg/dL (ref 2.3–4.6)

## 2013-04-13 NOTE — Progress Notes (Signed)
Results for BERTRAN, ZEIMET (MRN [id number]) as of 04/13/2013 09:42  Ref. Range 04/12/2013 07:23 04/12/2013 11:25 04/12/2013 11:44 04/12/2013 12:50 04/12/2013 13:00 04/12/2013 15:29 04/12/2013 17:22 04/12/2013 18:59 04/12/2013 21:46 04/13/2013 05:55 04/13/2013 07:35 04/13/2013 08:56  Glucose-Capillary Latest Range: 70-99 mg/dL 130 (H) 865 (H)    784 (H) 169 (H)  252 (H)  201 (H) 256 (H)    Please consider increasing correction to moderate tidwc.  Thank you, Lenor Coffin, RN, CNS, Diabetes Coordinator [phone])

## 2013-04-13 NOTE — Progress Notes (Signed)
Subjective: Interval History: Patient is more alert today. Complaining been hungery. Objective: Vital signs in last 24 hours: Temp:  [97.4 F (36.3 C)-98.6 F (37 C)] 97.8 F (36.6 C) (10/03 0636) Pulse Rate:  [102-114] 107 (10/03 0636) Resp:  [9-25] 20 (10/03 0636) BP: (104-137)/(40-86) 114/69 mmHg (10/03 0636) SpO2:  [96 %-100 %] 98 % (10/03 0636) Weight:  [111.7 kg (246 lb 4.1 oz)-112.492 kg (248 lb)] 111.857 kg (246 lb 9.6 oz) (10/03 0636) Weight change: 0.892 kg (1 lb 15.5 oz)  Intake/Output from previous day: 10/02 0701 - 10/03 0700 In: 350 [P.O.:240; I.V.:10; IV Piggyback:100] Out: -  Intake/Output this shift:    Patient generally is alert and orenited to place and person. Presently still he wants to continue with dialysis. Asked me my feeling about it. I told him his general conditioning is  continusly declining with out much sign of recovery. The dialysis may extend his life but with poor quality  Of life.I told him he hs been spending his life in the hospital the last couple of months for similar issues. I encourage him to consider DNR and possibly hold dialysis. He asked what will happen if he stop dialysis and I told him he may not survive. Patient wants to continue with dialysis until he makes his descion . Chest is clear to auscultation His heart exam regular rate and rhythm Abdomen is full with mild tenderness. Extremities he has 1+ edema  Lab Results:  Recent Labs  04/11/13 0520  04/12/13 0535  04/12/13 1859 04/13/13 0555  WBC 6.7  --  8.5  --   --   --   HGB 7.2*  < > 9.1*  < > 8.6* 8.1*  HCT 22.2*  < > 27.6*  < > 25.5* 24.6*  PLT 120*  --  100*  --   --   --   < > = values in this interval not displayed. BMET:   Recent Labs  04/11/13 0520 04/13/13 0555  NA 135 137  K 4.6 3.8  CL 101 100  CO2 28 28  GLUCOSE 171* 252*  BUN 33* 32*  CREATININE 4.62* 3.98*  CALCIUM 8.2* 8.5   No results found for this basename: PTH,  in the last 72 hours Iron  Studies:  No results found for this basename: IRON, TIBC, TRANSFERRIN, FERRITIN,  in the last 72 hours  Studies/Results: US Paracentesis  04/12/2013   CLINICAL DATA:  Sepsis, fever, ascites, abdominal pain, question spontaneous bacterial peritonitis  EXAM: ULTRASOUND GUIDED DIAGNOSTIC PARACENTESIS  COMPARISON:  04/06/2013  FINDINGS: Procedure, benefits, and risks of procedure were discussed with patient.  Written informed consent for procedure was obtained.  Time out protocol followed.  Small collection of ascites localized in lateral left mid abdomen.  Skin prepped and draped in usual sterile fashion.  Skin and soft tissues anesthetized with 10 mL of 1% lidocaine.  5 [place] Yueh catheter placed into peritoneal cavity.  120 mL of yellow fluid aspirated by syringe.  Procedure tolerated well by patient without immediate complication.  IMPRESSION: Successful ultrasound guided paracentesis yielding 120 mL of yellow ascites fluid for laboratory analysis.   Electronically Signed   By: Ulyses Southward M.D.   On: 04/12/2013 16:41    I have reviewed the patient's current medications.  Assessment/Plan: Problem #1 end-stage as this is status post hemodialysis dialysis yesterday. His BUN is 32 creatinine is 3.96 with potassium of 43.98. Problem #2 anemia presently patient is status post blood transfusion his hemoglobin and  hematocrit has improved. Problem #3 history of diabetes. Reasonably controlled Problem #4 history of liver cirrhosis/alcholic Problem #5 history of hepatic encephalopathy. Patient presently more alert Problem #6 metabolic bone disease his calcium and phosphorus is with  in acceptable range. Presently he is  off his  binder Problem #7 history of GI bleeding.Reqiuring repeated blood transfusion Problem #8 Actinobacter infection. Patient on imipenem and  Presently afebrile with normal white blood cell count. Plan: We'll make arrangements for patient to get dialysis today We'll check his basic  metabolic panel,  CBC in the morning. Patient overall was poor prognosis.     LOS: 9 days   Rameen Quinney S 04/13/2013,8:07 AM

## 2013-04-13 NOTE — Clinical Social Work Note (Signed)
CSW updated [facility] on pt. Facility is willing to accept pt at any point over the weekend. Aware pt will require IV antibiotics at dialysis.  Derenda Fennel, [place] [phone]

## 2013-04-13 NOTE — Progress Notes (Signed)
Subjective; Patient denies nausea vomiting or abdominal pain. He states his stools this morning was dark brown and not black. He is hungry. Objective; BP 114/69  Pulse 107  Temp(Src) 97.8 F (36.6 C) (Oral)  Resp 20  Ht 6\' 3"  (1.905 m)  Wt 246 lb 9.6 oz (111.857 kg)  BMI 30.82 kg/m2  SpO2 98% Patient is alert. Asterixis absent. Conjunctiva was pale. Sclerae nonicteric. Abdomen is full but edema to lower abdominal wall. Mild tenderness at RUQ. He has edema at to both lower extremities. He also has scrotal edema.  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 Status post therapeutic EGD yesterday. He has undergone multiple sessions distended without significant benefit and that would appear to be secondary to worsening portal hypertension and diminishing hepatic function. He may even have more lesions in his small intestine.  Prognosis remains very poor given multiple comorbidities Patient not a candidate for a pararenal transplant for reasons stated previously. He has been evaluated at [facility] and more recently at [facility].  Recommendations; Advance diet. CBC in am.

## 2013-04-13 NOTE — Progress Notes (Signed)
Timothy Chan:[id number] DOB: 04-27-1951 DOA: 04/04/2013 PCP: Ignatius Specking., MD   Subjective: This patient, who is well-known to the hospital, says he feels slightly better today but looks awful. He is due for dialysis again today. He had EGD yesterday by Dr. 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           Physical Exam: Blood pressure 114/69, pulse 107, temperature 97.8 F (36.6 C), temperature source Oral, resp. rate 20, height 6\' 3"  (1.905 m), weight 111.857 kg (246 lb 9.6 oz), SpO2 98.00%. He looks pale, sick. He is sleepy/drowsy. Heart sounds are present without murmurs. Lung fields are relatively clear. Abdomen is distended, not particularly tender.   Investigations:  Recent Results (from the past 240 hour(s))  CULTURE, BLOOD (ROUTINE X 2)     Status: None   Collection Time    04/07/13 12:47 PM      Result Value Range Status   Specimen Description BLOOD RIGHT HAND   Final   Special Requests BOTTLES DRAWN AEROBIC ONLY 10CC   Final   Culture  Setup Time     Final   Value: 04/08/2013 20:10     Performed at [facility]   Culture     Final   Value: ACINETOBACTER CALCOACETICUS/BAUMANNII COMPLEX     Note: SUSCEPTIBILITIES PERFORMED ON PREVIOUS CULTURE WITHIN THE LAST 5 DAYS.     Note: CORRECTED REPORT CALLED TO MARGARET BAUGHAM 04/09/13 1045 BY SMITHERSJ     Performed at [facility]   Report Status 04/11/2013 FINAL   Final  CULTURE, BLOOD (ROUTINE X 2)     Status: None   Collection Time    04/07/13  1:02 PM      Result Value Range Status   Specimen Description BLOOD RIGHT HAND   Final   Special Requests BOTTLES DRAWN AEROBIC ONLY 10CC   Final   Culture  Setup Time     Final   Value: 04/08/2013 20:10     Performed at [facility]   Culture     Final   Value: ACINETOBACTER CALCOACETICUS/BAUMANNII COMPLEX     Note: COLISTIN 0.50ug/ml ETEST results for  this drug are "FOR INVESTIGATIONAL USE ONLY" and should NOT be used for clinical purposes.     Note: CORRECTED REPORT CALLED TO MARGARET BAUGHAM AT [facility] 04/09/13 1045     Performed at [facility]   Report Status 04/11/2013 FINAL   Final   Organism ID, Bacteria ACINETOBACTER CALCOACETICUS/BAUMANNII COMPLEX   Final  CULTURE, BLOOD (ROUTINE X 2)     Status: None   Collection Time    04/08/13 12:04 PM      Result Value Range Status   Specimen Description BLOOD RIGHT HAND   Final   Special Requests BOTTLES DRAWN AEROBIC AND ANAEROBIC 8CC   Final   Culture NO GROWTH 4 DAYS   Final   Report Status PENDING   Incomplete  CULTURE, BLOOD (ROUTINE X 2)     Status: None   Collection Time    04/08/13  4:27 PM      Result Value Range Status   Specimen Description BLOOD RIGHT HAND   Final   Special Requests BOTTLES DRAWN AEROBIC ONLY 6CC BOTTLE   Final   Culture NO GROWTH 4 DAYS   Final   Report Status PENDING   Incomplete  ANAEROBIC CULTURE     Status: None  Collection Time    04/12/13 12:50 PM      Result Value Range Status   Specimen Description FLUID ASCITIC COLLECTED BY DOCTOR BOLES   Final   Special Requests NONE   Final   Gram Stain     Final   Value: RARE WBC PRESENT, PREDOMINANTLY PMN     NO ORGANISMS SEEN     Performed at [facility]   Culture PENDING   Incomplete   Report Status PENDING   Incomplete  BODY FLUID CULTURE     Status: None   Collection Time    04/12/13 12:50 PM      Result Value Range Status   Specimen Description FLUID ASCITIC COLLECTED BY DOCTOR BOLES   Final   Special Requests NONE   Final   Gram Stain     Final   Value: RARE WBC PRESENT, PREDOMINANTLY PMN     NO ORGANISMS SEEN     Performed at [facility]   Culture     Final   Value: NO GROWTH 1 DAY     Performed at [facility]   Report Status PENDING   Incomplete     Basic Metabolic Panel:  Recent Labs  16/10/96 0520 04/12/13 0013 04/13/13 0555  NA 135  --  137   K 4.6  --  3.8  CL 101  --  100  CO2 28  --  28  GLUCOSE 171*  --  252*  BUN 33*  --  32*  CREATININE 4.62*  --  3.98*  CALCIUM 8.2*  --  8.5  PHOS 3.0 2.8  --    Liver Function Tests: No results found for this basename: AST, ALT, ALKPHOS, BILITOT, PROT, ALBUMIN,  in the last 72 hours   CBC:  Recent Labs  04/11/13 0520  04/12/13 0535  04/12/13 1859 04/13/13 0555  WBC 6.7  --  8.5  --   --   --   HGB 7.2*  < > 9.1*  < > 8.6* 8.1*  HCT 22.2*  < > 27.6*  < > 25.5* 24.6*  MCV 92.9  --  92.6  --   --   --   PLT 120*  --  100*  --   --   --   < > = values in this interval not displayed.  US Paracentesis  04/12/2013   CLINICAL DATA:  Sepsis, fever, ascites, abdominal pain, question spontaneous bacterial peritonitis  EXAM: ULTRASOUND GUIDED DIAGNOSTIC PARACENTESIS  COMPARISON:  04/06/2013  FINDINGS: Procedure, benefits, and risks of procedure were discussed with patient.  Written informed consent for procedure was obtained.  Time out protocol followed.  Small collection of ascites localized in lateral left mid abdomen.  Skin prepped and draped in usual sterile fashion.  Skin and soft tissues anesthetized with 10 mL of 1% lidocaine.  5 [place] Yueh catheter placed into peritoneal cavity.  120 mL of yellow fluid aspirated by syringe.  Procedure tolerated well by patient without immediate complication.  IMPRESSION: Successful ultrasound guided paracentesis yielding 120 mL of yellow ascites fluid for laboratory analysis.   Electronically Signed   By: Ulyses Southward M.D.   On: 04/12/2013 16:41      Medications: I have reviewed the patient's current medications.  Impression:  Principal Problem:   Sepsis due to Acinetobacter species Active Problems:   Acute blood loss anemia   Alcoholic cirrhosis/remote TIPS   Diabetes mellitus type 2, uncontrolled, with complications   CKD (chronic  kidney disease) stage V requiring chronic dialysis   GI bleed   Abdominal pain   Thrombocytopenia,  unspecified   GAVE (gastric antral vascular ectasia)   Protein-calorie malnutrition, severe   Chest pain   Encephalopathy, hepatic     Plan: 1. Continue with current therapy for the time being. 2. Dialysis today.  Consultants:  Gastroenterology, Dr. Karilyn Cota  Nephrology, Dr Fausto Skillern.   Procedures:  None.   Antibiotics:  Primaxin started 04/11/2013.                   Code Status: Currently full code.  Family Communication: I discussed with them regarding CODE STATUS again. He still is clinging to a hope that he will be cured. I've told him that this will not happen. I've told him we'll discuss this further again tomorrow.   Disposition Plan: Depending on progress.  Time spent: 20 minutes.   LOS: 9 days   Wilson Singer Pager [phone]  04/13/2013, 11:17 AM

## 2013-04-14 DIAGNOSIS — K922 Gastrointestinal hemorrhage, unspecified: Secondary | ICD-10-CM

## 2013-04-14 DIAGNOSIS — E43 Unspecified severe protein-calorie malnutrition: Secondary | ICD-10-CM

## 2013-04-14 LAB — CBC
Hemoglobin: 6.5 g/dL — CL (ref 13.0–17.0)
MCH: 31 pg (ref 26.0–34.0)
MCHC: 33.5 g/dL (ref 30.0–36.0)
MCV: 92.4 fL (ref 78.0–100.0)
Platelets: 91 10*3/uL — ABNORMAL LOW (ref 150–400)
RBC: 2.1 MIL/uL — ABNORMAL LOW (ref 4.22–5.81)

## 2013-04-14 LAB — BASIC METABOLIC PANEL WITH GFR
BUN: 29 mg/dL — ABNORMAL HIGH (ref 6–23)
CO2: 26 meq/L (ref 19–32)
Calcium: 7.8 mg/dL — ABNORMAL LOW (ref 8.4–10.5)
Chloride: 99 meq/L (ref 96–112)
Creatinine, Ser: 3.53 mg/dL — ABNORMAL HIGH (ref 0.50–1.35)
GFR calc Af Amer: 20 mL/min — ABNORMAL LOW
GFR calc non Af Amer: 17 mL/min — ABNORMAL LOW
Glucose, Bld: 261 mg/dL — ABNORMAL HIGH (ref 70–99)
Potassium: 4.3 meq/L (ref 3.5–5.1)
Sodium: 134 meq/L — ABNORMAL LOW (ref 135–145)

## 2013-04-14 LAB — GLUCOSE, CAPILLARY
Glucose-Capillary: 241 mg/dL — ABNORMAL HIGH (ref 70–99)
Glucose-Capillary: 273 mg/dL — ABNORMAL HIGH (ref 70–99)

## 2013-04-14 MED ORDER — METOCLOPRAMIDE HCL 5 MG/ML IJ SOLN
10.0000 mg | Freq: Once | INTRAMUSCULAR | Status: AC
  Administered 2013-04-14: 10 mg via INTRAVENOUS
  Filled 2013-04-14: qty 2

## 2013-04-14 MED ORDER — FLEET ENEMA 7-19 GM/118ML RE ENEM
2.0000 | ENEMA | Freq: Once | RECTAL | Status: AC
  Administered 2013-04-14: 2 via RECTAL

## 2013-04-14 MED ORDER — DIPHENHYDRAMINE HCL 25 MG PO CAPS
25.0000 mg | ORAL_CAPSULE | Freq: Three times a day (TID) | ORAL | Status: DC | PRN
  Administered 2013-04-14 – 2013-04-16 (×5): 25 mg via ORAL
  Filled 2013-04-14 (×6): qty 1

## 2013-04-14 MED ORDER — MORPHINE SULFATE 2 MG/ML IJ SOLN
2.0000 mg | INTRAMUSCULAR | Status: DC | PRN
  Administered 2013-04-14 – 2013-04-16 (×10): 2 mg via INTRAVENOUS
  Filled 2013-04-14 (×10): qty 1

## 2013-04-14 NOTE — Progress Notes (Signed)
CRITICAL VALUE ALERT  Critical value received: HGB 6.5  Date of notification:  04/14/13  Time of notification: 0755  Critical value read back:Yes  Nurse who received alert: Karl Luke, RN  MD notified (1st page): Dr. Karilyn Cota  Time of first page:0800  MD notified (2nd page):  Time of second page:  Responding MD:  Dr. Karilyn Cota  Time MD responded:  0800

## 2013-04-14 NOTE — Progress Notes (Signed)
Subjective; Patient states he does not feel well. He complains of abdominal bloating. He had a small BM yesterday but hasn't had good results in 4 or 5 days. He ate most of his lunch. He is hoping that his family will take him to [facility] next week because he wants to be evaluated by Dr. Sharlene Motts and his team.  Objective; BP 108/57  Pulse 116  Temp(Src) 98.6 F (37 C) (Oral)  Resp 20  Ht 6\' 3"  (1.905 m)  Wt 246 lb 9.6 oz (111.856 kg)  BMI 30.82 kg/m2  SpO2 97% Patient is alert and does not have asterixis. He appears pale. Abdomen is full with edema 2 abdominal wall. Dressing at left lower quadrant of abdomen the abdominal tap 2 days ago. Band-Aid is dry. Percussion note is very tympanitic and epigastric region. It is soft elsewhere. Massive lower extremity edema remains unchanged.  Lab data; WBC 8.0, H&H 6.5 and 19.4 and platelet count 91K  Assessment; #1. Chronic/recurrent GI bleed from gastric antral vascular ectasia. He underwent EGD with APC therapy 2 days ago. H&H is down again. He possibly has more vascular lesions involving small bowel. Patient was to be treated aggressively. Last month he was transferred to [facility].  #2. Abdominal pain secondary to gastric dilation. He possibly has gastroparesis he has gastroparesis. #3. Advanced alcoholic cirrhosis. #4. Hepatic encephalopathy. #5. Acinetobacter calcoaceticus sepsis. From the does not appear to be from peritoneal source as ascitic fluid cultures negative.  Recommendations; Fleets enema once or twice. Single does of metoclopramide 10 mg IV now. For labs in a.m.

## 2013-04-14 NOTE — Progress Notes (Signed)
After several discussions by myself and consultants including gastroenterology, Dr. Karilyn Cota and nephrology Dr. Wolfgang Phoenix, the patient himself has made the decision for comfort measures only and discontinue dialysis. He has agreed to go to a hospice home.  Plan: 1. Would hold blood transfusions. 2. Discontinue dialysis. 3. Discontinue intravenous antibiotics. 4. Discontinued lab work. 5. Hospice home referral.

## 2013-04-14 NOTE — Progress Notes (Signed)
ANTIBIOTIC CONSULT NOTE   Pharmacy Consult for Primaxin Indication: bacteremia  Allergies  Allergen Reactions  . Tylenol [Acetaminophen] Other (See Comments)    cirrhosis   Patient Measurements: Height: 6\' 3"  (190.5 cm) Weight: 246 lb 9.6 oz (111.856 kg) IBW/kg (Calculated) : 84.5  Vital Signs:   Intake/Output from previous day: 10/03 0701 - 10/04 0700 In: 240 [P.O.:240] Out: 990  Intake/Output from this shift:    Labs:  Recent Labs  04/12/13 0535  04/12/13 1859 04/13/13 0555 04/14/13 0732  WBC 8.5  --   --   --  8.0  HGB 9.1*  < > 8.6* 8.1* 6.5*  PLT 100*  --   --   --  91*  CREATININE  --   --   --  3.98* 3.53*  < > = values in this interval not displayed. Estimated Creatinine Clearance: 29.7 ml/min (by C-G formula based on Cr of 3.53). No results found for this basename: VANCOTROUGH, Leodis Binet, VANCORANDOM, GENTTROUGH, GENTPEAK, GENTRANDOM, TOBRATROUGH, TOBRAPEAK, TOBRARND, AMIKACINPEAK, AMIKACINTROU, AMIKACIN,  in the last 72 hours   Microbiology: Recent Results (from the past 720 hour(s))  MRSA PCR SCREENING     Status: Abnormal   Collection Time    03/19/13  3:00 AM      Result Value Range Status   MRSA by PCR POSITIVE (*) NEGATIVE Final   Comment:            The GeneXpert MRSA Assay (FDA     approved for NASAL specimens     only), is one component of a     comprehensive MRSA colonization     surveillance program. It is not     intended to diagnose MRSA     infection nor to guide or     monitor treatment for     MRSA infections.     RESULT CALLED TO, READ BACK BY AND VERIFIED WITH:     NEILSON,T @0530  ON 03/19/13 BY WOODIE,J  CULTURE, BLOOD (ROUTINE X 2)     Status: None   Collection Time    04/07/13 12:47 PM      Result Value Range Status   Specimen Description BLOOD RIGHT HAND   Final   Special Requests BOTTLES DRAWN AEROBIC ONLY 10CC   Final   Culture  Setup Time     Final   Value: 04/08/2013 20:10     Performed at [facility]    Culture     Final   Value: ACINETOBACTER CALCOACETICUS/BAUMANNII COMPLEX     Note: SUSCEPTIBILITIES PERFORMED ON PREVIOUS CULTURE WITHIN THE LAST 5 DAYS.     Note: CORRECTED REPORT CALLED TO MARGARET BAUGHAM 04/09/13 1045 BY SMITHERSJ     Performed at [facility]   Report Status 04/11/2013 FINAL   Final  CULTURE, BLOOD (ROUTINE X 2)     Status: None   Collection Time    04/07/13  1:02 PM      Result Value Range Status   Specimen Description BLOOD RIGHT HAND   Final   Special Requests BOTTLES DRAWN AEROBIC ONLY 10CC   Final   Culture  Setup Time     Final   Value: 04/08/2013 20:10     Performed at [facility]   Culture     Final   Value: ACINETOBACTER CALCOACETICUS/BAUMANNII COMPLEX     Note: COLISTIN 0.50ug/ml ETEST results for this drug are "FOR INVESTIGATIONAL USE ONLY" and should NOT be used for clinical purposes.  Note: CORRECTED REPORT CALLED TO MARGARET BAUGHAM AT [facility] 04/09/13 1045     Performed at [facility]   Report Status 04/11/2013 FINAL   Final   Organism ID, Bacteria ACINETOBACTER CALCOACETICUS/BAUMANNII COMPLEX   Final  CULTURE, BLOOD (ROUTINE X 2)     Status: None   Collection Time    04/08/13 12:04 PM      Result Value Range Status   Specimen Description BLOOD RIGHT HAND   Final   Special Requests BOTTLES DRAWN AEROBIC AND ANAEROBIC 8CC   Final   Culture NO GROWTH 5 DAYS   Final   Report Status 04/13/2013 FINAL   Final  CULTURE, BLOOD (ROUTINE X 2)     Status: None   Collection Time    04/08/13  4:27 PM      Result Value Range Status   Specimen Description BLOOD RIGHT HAND   Final   Special Requests BOTTLES DRAWN AEROBIC ONLY 6CC BOTTLE   Final   Culture NO GROWTH 5 DAYS   Final   Report Status 04/13/2013 FINAL   Final  ANAEROBIC CULTURE     Status: None   Collection Time    04/12/13 12:50 PM      Result Value Range Status   Specimen Description FLUID ASCITIC COLLECTED BY DOCTOR BOLES   Final   Special Requests NONE   Final    Gram Stain     Final   Value: RARE WBC PRESENT, PREDOMINANTLY PMN     NO ORGANISMS SEEN     Performed at [facility]   Culture     Final   Value: NO ANAEROBES ISOLATED; CULTURE IN PROGRESS FOR 5 DAYS     Performed at [facility]   Report Status PENDING   Incomplete  BODY FLUID CULTURE     Status: None   Collection Time    04/12/13 12:50 PM      Result Value Range Status   Specimen Description FLUID ASCITIC COLLECTED BY DOCTOR BOLES   Final   Special Requests NONE   Final   Gram Stain     Final   Value: RARE WBC PRESENT, PREDOMINANTLY PMN     NO ORGANISMS SEEN     Performed at [facility]   Culture     Final   Value: NO GROWTH 1 DAY     Performed at [facility]   Report Status PENDING   Incomplete    Medical History: Past Medical History  Diagnosis Date  . Rectal bleeding   . Dialysis care   . GI bleed   . Hypertension   . Esophageal varices   . Alcoholic liver disease   . Hepatic encephalopathy   . Ascites   . Diabetes mellitus   . Detached retina   . Pneumonia   . 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 ESRD on hemodialysis     Medications:  Scheduled:  . feeding supplement  237 mL Oral BID BM  . folic acid  1 mg Oral Daily  . Gerhardt's butt cream   Topical BID  . imipenem-cilastatin  250 mg Intravenous Q12H  . insulin aspart  0-9 Units Subcutaneous TID WC  . lactulose  30 g Oral TID  . levothyroxine  100 mcg Oral QAC breakfast  . metoCLOPramide  5 mg Oral TID AC & HS  . pantoprazole (PROTONIX) IV  40 mg Intravenous Q12H  . sodium chloride  3 mL Intravenous Q12H  . sucralfate  1 g Oral TID WC & HS   Assessment: 62yo male with positive blood cultures.  Acinetobacter bacteremia.  Pt with ESRD requiring dialysis.    ANTIBIOTICS: Primaxin 10/1 >> Cefotaxime 9/28 >> 10/1 Vancomycin 9/28 >> 9/30  Report Status 04/11/2013 FINAL    Organism ID, Bacteria ACINETOBACTER CALCOACETICUS/BAUMANNII COMPLEX    Resulting Agency [facility]   Culture & Susceptibility    Antibiotic  Organism Organism Organism     ACINETOBACTER CALCOACETICUS/BAUMANNII COMPLEX     AMIKACIN  <=16 SENSITIVE S Final       AMPICILLIN/SULBACTAM  >=32 RESISTANT R Final       CEFEPIME  16 INTERMEDIATE I Final       CEFTAZIDIME  >=64 RESISTANT R Final       CEFTRIAXONE  >=64 RESISTANT R Final       CIPROFLOXACIN  >=4 RESISTANT R Final       GENTAMICIN  >=16 RESISTANT R Final       IMIPENEM  1 SENSITIVE S Final       TOBRAMYCIN  8 INTERMEDIATE I Final       TRIMETH/SULFA  >=320 RESISTANT R Final       Goal of Therapy:  Eradicate infection.  Plan:  Continue Primaxin 250mg  IV q12hrs (dialysis dosing) Monitor labs, cultures, and progress  Raquel James, Kaymon Denomme Bennett 04/14/2013,10:05 AM

## 2013-04-14 NOTE — Progress Notes (Addendum)
Timothy Chan:[id number] DOB: 1950-12-08 DOA: 04/04/2013 PCP: Ignatius Specking., MD   Subjective: This patient, who is well-known to the hospital, says he feels slightly better today but looks awful.  He had EGD  by Dr. 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 He has not active bleeding by history today.           Physical Exam: Blood pressure 108/57, pulse 116, temperature 98.6 F (37 C), temperature source Oral, resp. rate 20, height 6\' 3"  (1.905 m), weight 111.856 kg (246 lb 9.6 oz), SpO2 97.00%. He looks pale, sick. He is sleepy/drowsy. Heart sounds are present without murmurs. Lung fields are relatively clear. Abdomen is distended, somewhat tender in a generalized fashion. He is not septic clinically.   Investigations:  Recent Results (from the past 240 hour(s))  CULTURE, BLOOD (ROUTINE X 2)     Status: None   Collection Time    04/07/13 12:47 PM      Result Value Range Status   Specimen Description BLOOD RIGHT HAND   Final   Special Requests BOTTLES DRAWN AEROBIC ONLY 10CC   Final   Culture  Setup Time     Final   Value: 04/08/2013 20:10     Performed at [facility]   Culture     Final   Value: ACINETOBACTER CALCOACETICUS/BAUMANNII COMPLEX     Note: SUSCEPTIBILITIES PERFORMED ON PREVIOUS CULTURE WITHIN THE LAST 5 DAYS.     Note: CORRECTED REPORT CALLED TO MARGARET BAUGHAM 04/09/13 1045 BY SMITHERSJ     Performed at [facility]   Report Status 04/11/2013 FINAL   Final  CULTURE, BLOOD (ROUTINE X 2)     Status: None   Collection Time    04/07/13  1:02 PM      Result Value Range Status   Specimen Description BLOOD RIGHT HAND   Final   Special Requests BOTTLES DRAWN AEROBIC ONLY 10CC   Final   Culture  Setup Time     Final   Value: 04/08/2013 20:10     Performed at [facility]   Culture     Final   Value: ACINETOBACTER CALCOACETICUS/BAUMANNII COMPLEX   Note: COLISTIN 0.50ug/ml ETEST results for this drug are "FOR INVESTIGATIONAL USE ONLY" and should NOT be used for clinical purposes.     Note: CORRECTED REPORT CALLED TO MARGARET BAUGHAM AT [facility] 04/09/13 1045     Performed at [facility]   Report Status 04/11/2013 FINAL   Final   Organism ID, Bacteria ACINETOBACTER CALCOACETICUS/BAUMANNII COMPLEX   Final  CULTURE, BLOOD (ROUTINE X 2)     Status: None   Collection Time    04/08/13 12:04 PM      Result Value Range Status   Specimen Description BLOOD RIGHT HAND   Final   Special Requests BOTTLES DRAWN AEROBIC AND ANAEROBIC 8CC   Final   Culture NO GROWTH 5 DAYS   Final   Report Status 04/13/2013 FINAL   Final  CULTURE, BLOOD (ROUTINE X 2)     Status: None   Collection Time    04/08/13  4:27 PM      Result Value Range Status   Specimen Description BLOOD RIGHT HAND   Final   Special Requests BOTTLES DRAWN AEROBIC ONLY 6CC BOTTLE   Final   Culture NO GROWTH 5 DAYS   Final   Report Status 04/13/2013 FINAL   Final  ANAEROBIC CULTURE     Status: None   Collection Time    04/12/13 12:50 PM      Result Value Range Status   Specimen Description FLUID ASCITIC COLLECTED BY DOCTOR BOLES   Final   Special Requests NONE   Final   Gram Stain     Final   Value: RARE WBC PRESENT, PREDOMINANTLY PMN     NO ORGANISMS SEEN     Performed at [facility]   Culture     Final   Value: NO ANAEROBES ISOLATED; CULTURE IN PROGRESS FOR 5 DAYS     Performed at [facility]   Report Status PENDING   Incomplete  BODY FLUID CULTURE     Status: None   Collection Time    04/12/13 12:50 PM      Result Value Range Status   Specimen Description FLUID ASCITIC COLLECTED BY DOCTOR BOLES   Final   Special Requests NONE   Final   Gram Stain     Final   Value: RARE WBC PRESENT, PREDOMINANTLY PMN     NO ORGANISMS SEEN     Performed at [facility]   Culture     Final   Value: NO GROWTH 1 DAY     Performed at [facility]    Report Status PENDING   Incomplete     Basic Metabolic Panel:  Recent Labs  14/78/29 0013 04/13/13 0555 04/14/13 0732  NA  --  137 134*  K  --  3.8 4.3  CL  --  100 99  CO2  --  28 26  GLUCOSE  --  252* 261*  BUN  --  32* 29*  CREATININE  --  3.98* 3.53*  CALCIUM  --  8.5 7.8*  PHOS 2.8  --   --        CBC:  Recent Labs  04/12/13 0535  04/13/13 0555 04/14/13 0732  WBC 8.5  --   --  8.0  HGB 9.1*  < > 8.1* 6.5*  HCT 27.6*  < > 24.6* 19.4*  MCV 92.6  --   --  92.4  PLT 100*  --   --  91*  < > = values in this interval not displayed.  US Paracentesis  04/12/2013   CLINICAL DATA:  Sepsis, fever, ascites, abdominal pain, question spontaneous bacterial peritonitis  EXAM: ULTRASOUND GUIDED DIAGNOSTIC PARACENTESIS  COMPARISON:  04/06/2013  FINDINGS: Procedure, benefits, and risks of procedure were discussed with patient.  Written informed consent for procedure was obtained.  Time out protocol followed.  Small collection of ascites localized in lateral left mid abdomen.  Skin prepped and draped in usual sterile fashion.  Skin and soft tissues anesthetized with 10 mL of 1% lidocaine.  5 [place] Yueh catheter placed into peritoneal cavity.  120 mL of yellow fluid aspirated by syringe.  Procedure tolerated well by patient without immediate complication.  IMPRESSION: Successful ultrasound guided paracentesis yielding 120 mL of yellow ascites fluid for laboratory analysis.   Electronically Signed   By: Ulyses Southward M.D.   On: 04/12/2013 16:41      Medications: I have reviewed the patient's current medications.  Impression: 1.Sepsis due to Acinetobacter species 2. 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 No active obvious bleeding but hemoglobin decreased today to 6.5. 3. 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 4. End-stage renal disease on hemodialysis. 5. Severe protein calorie  malnutrition with  anarsarca. 6. Type 2 diabetes mellitus.      Plan: 1. Continue with antibiotics for bacteremia. 2. Transfuse 2 units of blood today.   Consultants:  Gastroenterology, Dr. Karilyn Cota  Nephrology, Dr Fausto Skillern.   Procedures:  None.   Antibiotics:  Primaxin started 04/11/2013.                   Code Status: Currently full code.  Family Communication: I had a long discussion with the patient, a friend, [name], who is one of the health care power of attorney's, the patient's son by telephone, who is also her health care power of attorney. I discussed at length pathophysiology and treatment options and prognosis. I have told them that treatment options are now extremely limited and I do not believe that he will improve. I recommended that the patient be DO NOT RESUSCITATE and that hemodialysis be discontinued. The patient says that at the present time he does want to be resuscitated but family members do not wish him to suffer. I am somewhat doubtful as to whether the patient at this point he is able to make competent medical decisions. I have asked the family and [name] to discuss my conversation in detail today. For the time being we will continue to treat him and he is a full code.  Disposition Plan: Depending on progress.  Time spent: 30 minutes.   LOS: 10 days   Wilson Singer Pager [phone]  04/14/2013, 10:53 AM

## 2013-04-14 NOTE — Progress Notes (Signed)
Timothy Chan  MRN: [id number]  DOB/AGE: [birth year] 62 y.o.  Primary Care Physician:VYAS,DHRUV B., MD  Admit date: 04/04/2013  Chief Complaint:  Chief Complaint  Patient presents with  . Flank Pain    S-Pt presented on  04/04/2013 with  Chief Complaint  Patient presents with  . Flank Pain  .  Pt offers no new complaints. Pt said he would like to stop dialysis . Pt said he would like to be comfortable.   Meds . feeding supplement  237 mL Oral BID BM  . folic acid  1 mg Oral Daily  . Gerhardt's butt cream   Topical BID  . imipenem-cilastatin  250 mg Intravenous Q12H  . insulin aspart  0-9 Units Subcutaneous TID WC  . lactulose  30 g Oral TID  . levothyroxine  100 mcg Oral QAC breakfast  . metoCLOPramide  5 mg Oral TID AC & HS  . pantoprazole (PROTONIX) IV  40 mg Intravenous Q12H  . sodium chloride  3 mL Intravenous Q12H  . sucralfate  1 g Oral TID WC & HS       Physical Exam: Vital signs in last 24 hours: Temp:  [97.9 F (36.6 C)-98.6 F (37 C)] 98.6 F (37 C) (10/03 2117) Pulse Rate:  [104-117] 116 (10/03 2117) Resp:  [18-20] 20 (10/03 2117) BP: (93-126)/(49-60) 108/57 mmHg (10/03 2117) SpO2:  [97 %-98 %] 97 % (10/03 2117) Weight:  [246 lb 9.6 oz (111.856 kg)] 246 lb 9.6 oz (111.856 kg) (10/04 0500) Weight change: -1 lb 6.4 oz (-0.636 kg) Last BM Date: 04/13/13  Intake/Output from previous day: 10/03 0701 - 10/04 0700 In: 240 [P.O.:240] Out: 990      Physical Exam: General- pt is awake, follows commands. Resp- No acute REsp distress, Decreased BS at bases. CVS- S1S2 regular in rhythm GIT- BS+, soft, NT, ND EXT- 2+ LE Edema,  Access- left AVF +  Lab Results: CBC  Recent Labs  04/12/13 0535  04/13/13 0555 04/14/13 0732  WBC 8.5  --   --  8.0  HGB 9.1*  < > 8.1* 6.5*  HCT 27.6*  < > 24.6* 19.4*  PLT 100*  --   --  91*  < > = values in this interval not displayed.  BMET  Recent Labs  04/13/13 0555 04/14/13 0732  NA 137 134*  K 3.8  4.3  CL 100 99  CO2 28 26  GLUCOSE 252* 261*  BUN 32* 29*  CREATININE 3.98* 3.53*  CALCIUM 8.5 7.8*    MICRO Recent Results (from the past 240 hour(s))  CULTURE, BLOOD (ROUTINE X 2)     Status: None   Collection Time    04/07/13 12:47 PM      Result Value Range Status   Specimen Description BLOOD RIGHT HAND   Final   Special Requests BOTTLES DRAWN AEROBIC ONLY 10CC   Final   Culture  Setup Time     Final   Value: 04/08/2013 20:10     Performed at [facility]   Culture     Final   Value: ACINETOBACTER CALCOACETICUS/BAUMANNII COMPLEX     Note: SUSCEPTIBILITIES PERFORMED ON PREVIOUS CULTURE WITHIN THE LAST 5 DAYS.     Note: CORRECTED REPORT CALLED TO MARGARET BAUGHAM 04/09/13 1045 BY SMITHERSJ     Performed at [facility]   Report Status 04/11/2013 FINAL   Final  CULTURE, BLOOD (ROUTINE X 2)     Status: None   Collection Time  04/07/13  1:02 PM      Result Value Range Status   Specimen Description BLOOD RIGHT HAND   Final   Special Requests BOTTLES DRAWN AEROBIC ONLY 10CC   Final   Culture  Setup Time     Final   Value: 04/08/2013 20:10     Performed at [facility]   Culture     Final   Value: ACINETOBACTER CALCOACETICUS/BAUMANNII COMPLEX     Note: COLISTIN 0.50ug/ml ETEST results for this drug are "FOR INVESTIGATIONAL USE ONLY" and should NOT be used for clinical purposes.     Note: CORRECTED REPORT CALLED TO MARGARET BAUGHAM AT [facility] 04/09/13 1045     Performed at [facility]   Report Status 04/11/2013 FINAL   Final   Organism ID, Bacteria ACINETOBACTER CALCOACETICUS/BAUMANNII COMPLEX   Final  CULTURE, BLOOD (ROUTINE X 2)     Status: None   Collection Time    04/08/13 12:04 PM      Result Value Range Status   Specimen Description BLOOD RIGHT HAND   Final   Special Requests BOTTLES DRAWN AEROBIC AND ANAEROBIC 8CC   Final   Culture NO GROWTH 5 DAYS   Final   Report Status 04/13/2013 FINAL   Final  CULTURE, BLOOD (ROUTINE X 2)      Status: None   Collection Time    04/08/13  4:27 PM      Result Value Range Status   Specimen Description BLOOD RIGHT HAND   Final   Special Requests BOTTLES DRAWN AEROBIC ONLY 6CC BOTTLE   Final   Culture NO GROWTH 5 DAYS   Final   Report Status 04/13/2013 FINAL   Final  ANAEROBIC CULTURE     Status: None   Collection Time    04/12/13 12:50 PM      Result Value Range Status   Specimen Description FLUID ASCITIC COLLECTED BY DOCTOR BOLES   Final   Special Requests NONE   Final   Gram Stain     Final   Value: RARE WBC PRESENT, PREDOMINANTLY PMN     NO ORGANISMS SEEN     Performed at [facility]   Culture     Final   Value: NO ANAEROBES ISOLATED; CULTURE IN PROGRESS FOR 5 DAYS     Performed at [facility]   Report Status PENDING   Incomplete  BODY FLUID CULTURE     Status: None   Collection Time    04/12/13 12:50 PM      Result Value Range Status   Specimen Description FLUID ASCITIC COLLECTED BY DOCTOR BOLES   Final   Special Requests NONE   Final   Gram Stain     Final   Value: RARE WBC PRESENT, PREDOMINANTLY PMN     NO ORGANISMS SEEN     Performed at [facility]   Culture     Final   Value: NO GROWTH 1 DAY     Performed at [facility]   Report Status PENDING   Incomplete      Lab Results  Component Value Date   CALCIUM 7.8* 04/14/2013   PHOS 2.8 04/12/2013       Impression: 1)Renal  ESRD on HD On MWF schedule  Pt Dialyzed  yesterday   2)CVS-   BP stable.   3)Anemia HGb not at goal (9--11) Hx  Of GI bleed ( hx of GAVE) Multiple transfusions  To recive Transfusion on HD  4)ID-  Sepsis On ABX  5)Liver Hx of Alc Cirrhosis. Hepatic Encephalopathy  6)Electrolytes Normokalemic NOrmonatremic   7)Acid base Co2 at goal   8)Prognosis Pt with poor prognosis. Pt now wants to go hospice and does not want dialysis.    Plan:  Agree with pt decision. Pt family ( son -Alinda Money was on phone) is in agreement. Pt friend  was in room.( she also respects pt decision) Will d/c  Dialysis.         Christean Silvestri S 04/14/2013, 8:08 AM

## 2013-04-15 LAB — TYPE AND SCREEN
ABO/RH(D): B NEG
Antibody Screen: NEGATIVE
Unit division: 0
Unit division: 0
Unit division: 0
Unit division: 0

## 2013-04-15 LAB — GLUCOSE, CAPILLARY
Glucose-Capillary: 274 mg/dL — ABNORMAL HIGH (ref 70–99)
Glucose-Capillary: 205 mg/dL — ABNORMAL HIGH (ref 70–99)

## 2013-04-15 LAB — BODY FLUID CULTURE: Culture: NO GROWTH

## 2013-04-15 NOTE — Progress Notes (Signed)
Patient ID: Timothy Chan, male   DOB: Jan 28, 1951, 62 y.o.   MRN: [id number] This is a courtesy note. He is a patient with history of severe liver cirrhosis, diabetes and end-stage renal disease presently he is made comfort only. Patient today is alert and doesn't seem to be any apparent distress. I have discussed with him and agreed with the decision to make him comfortable as patient has multiple medical problems and progressively worsening. Presently dialysis has been discontinued. He told me he slept last night very well after he was given some morphine. As this moment we'll continue with comfort care and possibly involve hospice.

## 2013-04-15 NOTE — Progress Notes (Signed)
Timothy Chan:[id number] DOB: 08/29/1950 DOA: 04/04/2013 PCP: Ignatius Specking., MD   Subjective: This patient has decided found to discontinue aggressive care and is in for comfort measures only. He is agreed to go to a hospice home when a bed is available, hopefully tomorrow. He has been requiring intravenous morphine for pain and this is helping him.[name]           Physical Exam: Blood pressure 98/45, pulse 107, temperature 98.4 F (36.9 C), temperature source Oral, resp. rate 20, height 6\' 3"  (1.905 m), weight 113.7 kg (250 lb 10.6 oz), SpO2 96.00%. He looks pale, sick. He is sleepy/drowsy. Heart sounds are present without murmurs. Lung fields are relatively clear. Abdomen is distended, somewhat tender in a generalized fashion. He is not septic clinically.   Investigations:  Recent Results (from the past 240 hour(s))  CULTURE, BLOOD (ROUTINE X 2)     Status: None   Collection Time    04/07/13 12:47 PM      Result Value Range Status   Specimen Description BLOOD RIGHT HAND   Final   Special Requests BOTTLES DRAWN AEROBIC ONLY 10CC   Final   Culture  Setup Time     Final   Value: 04/08/2013 20:10     Performed at [facility]   Culture     Final   Value: ACINETOBACTER CALCOACETICUS/BAUMANNII COMPLEX     Note: SUSCEPTIBILITIES PERFORMED ON PREVIOUS CULTURE WITHIN THE LAST 5 DAYS.     Note: CORRECTED REPORT CALLED TO MARGARET BAUGHAM 04/09/13 1045 BY SMITHERSJ     Performed at [facility]   Report Status 04/11/2013 FINAL   Final  CULTURE, BLOOD (ROUTINE X 2)     Status: None   Collection Time    04/07/13  1:02 PM      Result Value Range Status   Specimen Description BLOOD RIGHT HAND   Final   Special Requests BOTTLES DRAWN AEROBIC ONLY 10CC   Final   Culture  Setup Time     Final   Value: 04/08/2013 20:10     Performed at [facility]   Culture     Final   Value: ACINETOBACTER CALCOACETICUS/BAUMANNII COMPLEX     Note: COLISTIN 0.50ug/ml ETEST  results for this drug are "FOR INVESTIGATIONAL USE ONLY" and should NOT be used for clinical purposes.     Note: CORRECTED REPORT CALLED TO MARGARET BAUGHAM AT [facility] 04/09/13 1045     Performed at [facility]   Report Status 04/11/2013 FINAL   Final   Organism ID, Bacteria ACINETOBACTER CALCOACETICUS/BAUMANNII COMPLEX   Final  CULTURE, BLOOD (ROUTINE X 2)     Status: None   Collection Time    04/08/13 12:04 PM      Result Value Range Status   Specimen Description BLOOD RIGHT HAND   Final   Special Requests BOTTLES DRAWN AEROBIC AND ANAEROBIC 8CC   Final   Culture NO GROWTH 5 DAYS   Final   Report Status 04/13/2013 FINAL   Final  CULTURE, BLOOD (ROUTINE X 2)     Status: None   Collection Time    04/08/13  4:27 PM      Result Value Range Status   Specimen Description BLOOD RIGHT HAND   Final   Special Requests BOTTLES DRAWN AEROBIC ONLY 6CC BOTTLE   Final   Culture NO GROWTH 5 DAYS   Final   Report Status 04/13/2013 FINAL   Final  ANAEROBIC CULTURE  Status: None   Collection Time    04/12/13 12:50 PM      Result Value Range Status   Specimen Description FLUID ASCITIC COLLECTED BY DOCTOR BOLES   Final   Special Requests NONE   Final   Gram Stain     Final   Value: RARE WBC PRESENT, PREDOMINANTLY PMN     NO ORGANISMS SEEN     Performed at [facility]   Culture     Final   Value: NO ANAEROBES ISOLATED; CULTURE IN PROGRESS FOR 5 DAYS     Performed at [facility]   Report Status PENDING   Incomplete  BODY FLUID CULTURE     Status: None   Collection Time    04/12/13 12:50 PM      Result Value Range Status   Specimen Description FLUID ASCITIC COLLECTED BY DOCTOR BOLES   Final   Special Requests NONE   Final   Gram Stain     Final   Value: RARE WBC PRESENT, PREDOMINANTLY PMN     NO ORGANISMS SEEN     Performed at [facility]   Culture     Final   Value: NO GROWTH 2 DAYS     Performed at [facility]   Report Status PENDING   Incomplete      Basic Metabolic Panel:  Recent Labs  28/41/32 0555 04/14/13 0732  NA 137 134*  K 3.8 4.3  CL 100 99  CO2 28 26  GLUCOSE 252* 261*  BUN 32* 29*  CREATININE 3.98* 3.53*  CALCIUM 8.5 7.8*       CBC:  Recent Labs  04/13/13 0555 04/14/13 0732  WBC  --  8.0  HGB 8.1* 6.5*  HCT 24.6* 19.4*  MCV  --  92.4  PLT  --  91*        Medications: I have reviewed the patient's current medications.  Impression: 1.Sepsis due to Acinetobacter species 2. 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  3. 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 4. End-stage renal disease , dialysis discontinued. 5. Severe protein calorie malnutrition with anarsarca. 6. Type 2 diabetes mellitus.  7. Palliative performance score of 30-40%, prognosis poor for survival, patient is hospice home of appropriate.     Plan: 1. Comfort measures only. 2. Hopefully, he can be discharged to a hospice home for end-of-life care tomorrow..   Consultants:  Gastroenterology, Dr. Karilyn Cota  Nephrology, Dr Fausto Skillern.   Procedures:  None.   Antibiotics:  Primaxin started 04/11/2013.                   Code Status: DO NOT RESUSCITATE.  Family Communication: Discussed plan with patient at the bedside and he is in agreement.  Disposition Plan: Depending on progress.  Time spent: 20 minutes.   LOS: 11 days   Wilson Singer Pager [phone]  04/15/2013, 10:08 AM

## 2013-04-16 ENCOUNTER — Encounter (HOSPITAL_COMMUNITY): Payer: Self-pay | Admitting: Internal Medicine

## 2013-04-16 MED ORDER — LACTULOSE 10 GM/15ML PO SOLN: 30.0000 g | Freq: Two times a day (BID) | ORAL | Status: AC

## 2013-04-16 MED ORDER — OXYCODONE HCL 5 MG PO TABS: 5.0000 mg | ORAL_TABLET | Freq: Four times a day (QID) | ORAL | Status: DC | PRN

## 2013-04-16 MED ORDER — MORPHINE SULFATE 2 MG/ML IJ SOLN: 2.0000 mg | INTRAMUSCULAR | Status: AC | PRN

## 2013-04-16 MED ORDER — HALOPERIDOL 2 MG PO TABS: 2.0000 mg | ORAL_TABLET | Freq: Two times a day (BID) | ORAL | Status: AC | PRN

## 2013-04-16 MED ORDER — OXYCODONE HCL 5 MG/5ML PO SOLN: 5.0000 mg | ORAL | Status: AC | PRN

## 2013-04-16 MED ORDER — LORAZEPAM 1 MG PO TABS: 1.0000 mg | ORAL_TABLET | Freq: Three times a day (TID) | ORAL | Status: AC

## 2013-04-16 MED ORDER — OXYCODONE HCL 5 MG/5ML PO SOLN: 5.0000 mg | ORAL | Status: DC | PRN

## 2013-04-16 NOTE — Progress Notes (Signed)
04/16/13 1625 Patient being discharged to [facility]. Report given to Martie Lee, receiving nurse at facility. Pt awaiting EMS arrival for transport. Son at bedside. Earnstine Regal, RN

## 2013-04-16 NOTE — Discharge Summary (Addendum)
Physician Discharge Summary  Timothy Chan ZOX:[id number] DOB: [birth year] DOA: 04/04/2013  PCP: Timothy Specking., Chan  Admit date: 04/04/2013 Discharge date: 04/16/2013  Time spent: >35 minutes  Recommendations for Outpatient Follow-up:  F/u with hospice care   Discharge Diagnoses:  Principal Problem:   Sepsis due to Acinetobacter species Active Problems:   Alcoholic cirrhosis/remote TIPS   Diabetes mellitus type 2, uncontrolled, with complications   CKD (chronic kidney disease) stage V requiring chronic dialysis   Acute blood loss anemia   GI bleed   Abdominal pain   Thrombocytopenia, unspecified   GAVE (gastric antral vascular ectasia)   Protein-calorie malnutrition, severe   Chest pain   Encephalopathy, hepatic   Discharge Condition: poor   Diet recommendation: heart healthy   Filed Weights   04/13/13 0636 04/14/13 0500 04/15/13 0502  Weight: 111.857 kg (246 lb 9.6 oz) 111.856 kg (246 lb 9.6 oz) 113.7 kg (250 lb 10.6 oz)    History of present illness:  62 y.o. year-old male with history of alcoholic Timothy Chan status post TIPS procedure approximately 10 years ago, end-stage renal disease on hemodialysis, recurrent upper GI bleeding secondary to gastric antral vascular ectasia (GAVE) who is followed at [facility], [place] and by Dr. Karilyn Chan, HTN, and T2DM who presents with fall with right upper quadrant pain found to have bacteremia and GIB   Hospital Course:  Patient found to have Acinetobacter bacteremia was on IV atx post HD; hospitalizations was complicated with acute GIB, underwent endoscopy; patient progressively declined with acute GIB, on top of End stage liver, and renal disease;  -patient requested to be under comfort care; hospice care was consulted; HD stopped  -patient is being d/c under hospice care     Procedures:  EGD (i.e. Studies not automatically included, echos, thoracentesis, etc; not x-rays)  Consultations:  Nephrology, GI  Discharge Exam: Filed Vitals:    04/16/13 0755  BP: 90/45  Pulse: 82  Temp: 97.9 F (36.6 C)  Resp: 18    General: alert  Cardiovascular: s1,s2 rrr Respiratory: LL crackles   Discharge Instructions  Discharge Orders   Future Orders Complete By Expires   Diet - low sodium heart healthy  As directed    Discharge instructions  As directed    Comments:     Follow up with hospice care   Increase activity slowly  As directed        Medication List    STOP taking these medications       ferrous sulfate 325 (65 FE) MG tablet     folic acid 400 MCG tablet  Commonly known as:  FOLVITE     sevelamer carbonate 800 MG tablet  Commonly known as:  RENVELA     sucralfate 1 GM/10ML suspension  Commonly known as:  CARAFATE     traMADol 50 MG tablet  Commonly known as:  ULTRAM      TAKE these medications       haloperidol 2 MG tablet  Commonly known as:  HALDOL  Take 1 tablet (2 mg total) by mouth 2 (two) times daily as needed.     lactulose 10 GM/15ML solution  Commonly known as:  CHRONULAC  Take 45 mLs (30 g total) by mouth 2 (two) times daily.     LORazepam 1 MG tablet  Commonly known as:  ATIVAN  Take 1 tablet (1 mg total) by mouth every 8 (eight) hours.     metoCLOPramide 5 MG tablet  Commonly known as:  REGLAN  Take 1 tablet (5 mg total) by mouth 4 (four) times daily -  before meals and at bedtime.     morphine 2 MG/ML injection  Inject 1 mL (2 mg total) into the vein every 4 (four) hours as needed.     ondansetron 4 MG tablet  Commonly known as:  ZOFRAN  Take 4 mg by mouth daily as needed for nausea.     oxyCODONE 5 MG/5ML solution  Commonly known as:  ROXICODONE  Take 5 mLs (5 mg total) by mouth every 4 (four) hours as needed for pain.     pantoprazole 40 MG tablet  Commonly known as:  PROTONIX  Take 1 tablet (40 mg total) by mouth 2 (two) times daily before a meal.     WESTCORT 0.2 %  Generic drug:  hydrocortisone valerate ointment  Apply 1 application topically every Monday,  Wednesday, and Friday. For numbing prior to dialysis       Allergies  Allergen Reactions  . Tylenol [Acetaminophen] Other (See Comments)    cirrhosis       Follow-up Information   Follow up with Timothy Chan. Schedule an appointment as soon as possible for a visit in 2 weeks.   Specialty:  Internal Medicine   Contact information:   [address] [place] [address] [phone]       Follow up with Timothy Chan.   Specialty:  Gastroenterology   Contact information:   [address] [place] [address]        The results of significant diagnostics from this hospitalization (including imaging, microbiology, ancillary and laboratory) are listed below for reference.    Significant Diagnostic Studies: Ct Abdomen Wo Contrast  04/08/2013   CLINICAL DATA:  Right upper quadrant abdominal pain and anemia status post fall today. Question hematoma.  EXAM: CT ABDOMEN WITHOUT CONTRAST  TECHNIQUE: Multidetector CT imaging of the abdomen was performed following the standard protocol without IV contrast.  COMPARISON:  Abdominal pelvic CT 10/27/2012.  FINDINGS: The small chronic right pleural effusion and associated pleural thickening are stable. Adjacent rounded atelectasis in the right lower lobe appears stable. There is a new small left pleural effusion with mild left lower lobe atelectasis. The heart size is stable. Coronary artery calcifications are noted.  There has been interval development of anasarca with extensive soft tissue edema and ill-defined fluid throughout the subcutaneous fat, greater on the left. This does not demonstrate increased density, although could reflect subcutaneous subacute hemorrhage, or if the patient has been anemic for some time, more recent hemorrhage. There is relatively mild mesenteric edema. Ascites is stable in volume. There is no retroperitoneal hematoma.  The peripherally calcified lesion posteriorly in the right hepatic lobe  appears unchanged. The TIPS appears unchanged. The spleen and adrenal glands appear normal. The pancreas is atrophied with multiple parenchymal calcifications. Both kidneys are chronically atrophied. There is no hydronephrosis.  There is diffuse aortoiliac atherosclerosis. The stomach and intra-abdominal portions of the small bowel and colon demonstrate no significant findings. Prominent lymph nodes in the upper abdomen appear unchanged.  There is no evidence of acute fracture. Anterior wedging at T12 appears unchanged. There is chronic disc space loss at L2-3 which appears stable.  The pelvis was not imaged.  IMPRESSION: 1. Anasarca with asymmetric left flank edema/ill-defined fluid. This could reflect subacute hemorrhage or more recent hemorrhage if the patient is anemic. There is no well-defined hematoma. 2. Stable small volume ascites and relatively mild mesenteric  edema. 3. Stable chronic right pleural effusion. New small left pleural effusion. 4. Stable incidental abdominal findings including findings of chronic calcific pancreatitis and bilateral renal atrophy. Prior TIPS appears unchanged.   Electronically Signed   By: Roxy Horseman   On: 04/04/2013 20:23   Dg Abd 1 View  04/07/2013   CLINICAL DATA:  Abdominal pain and distention trauma data is patient, leukocytosis  ABDOMEN - 1 VIEW  COMPARISON:  Portable exam 1251 hr compared to 04/04/2013  FINDINGS: Normal bowel gas pattern.  Tips shunt identified.  No bowel dilatation or bowel wall thickening.  Bones demineralized.  Small right pleural effusion and mild bibasilar opacities.  IMPRESSION: Normal bowel gas pattern.   Electronically Signed   By: Ulyses Southward M.D.   On: 04/07/2013 13:36   Ct Angio Chest Pe W/cm &/or Wo Cm  03/26/2013   *RADIOLOGY REPORT*  Clinical Data: Short of breath  CT ANGIOGRAPHY CHEST  Technique:  Multidetector CT imaging of the chest using the standard protocol during bolus administration of intravenous contrast. Multiplanar  reconstructed images including MIPs were obtained and reviewed to evaluate the vascular anatomy.  Contrast: OMNIPAQUE IOHEXOL 350 MG/ML SOLN  Comparison: Abdomen CT 10/27/2012  Findings: There are no filling defects in the pulmonary arterial tree to suggest acute pulmonary thromboembolism.  Atherosclerotic changes of the aortic arch and descending aorta are noted.  Mild peripheral calcifications of the aortic valve.  Three- vessel advance coronary artery calcifications are present.  There is calcified plaque in the proximal left subclavian artery without obvious significant narrowing.  Left common carotid artery is patent.  Innominate arteries patent.  Right common carotid artery and subclavian artery grossly patent.  Bilateral vertebral arteries are also grossly patent.  13 mm right paratracheal lymph node on image 30.  Other smaller mediastinal nodes are scattered.  11 mm right paratracheal node on image 14 at the thoracic inlet.  There is a small loculated pleural effusion at the right lung base and is not significantly changed.  An adjacent mass-like area of round consolidation in the right lower lobe measuring 4.6 cm is not significantly changed.  There is extensive volume loss in the right lower lung.  Linear opacities extend from this round area to the hilum and this has been previously characterized and round atelectasis.  A small left pleural effusion primarily posteriorly is slightly increased in size.  It is somewhat irregular and also appears loculated.  Reticulonodular opacities are present at the left lung base have increased and are most consistent with an inflammatory process. Primarily linear opacities are present at the right lung base compatible with volume loss.  Minimal T4 and T5 superior end plate depressions at a chronic appearance.  No definite acute bony deformity.  There are calcifications at the dome of the right hemidiaphragm adjacent to a partially calcified liver lesion.  This is  not significantly changed.  A TIPS device is in place.  Diffuse subcutaneous fat edema. Small amount of fluid density is present in the abdomen anterior to the spleen.  IMPRESSION: No evidence of acute pulmonary thromboembolism.  There is extensive volume loss in the right lung.  Round atelectasis is present at the right lung base.  Small bilateral loculated pleural effusions.  On the right, it is not significantly changed.  On the left it is new.  Reticulonodular opacities at the left lung base worrisome for inflammatory process.  Mild mediastinal adenopathy.  Differential diagnosis includes both inflammatory and neoplastic etiology.  Correlate  clinically as for the need for follow-up chest CT to ensure resolution.  Stable findings in the abdomen.   Original Report Authenticated By: Jolaine Click, M.D.   US Abdomen Limited  04/06/2013   CLINICAL DATA:  Abdominal pain and distention. Evaluate for ascites.  EXAM: LIMITED ABDOMEN ULTRASOUND FOR ASCITES  TECHNIQUE: Limited ultrasound survey for ascites was performed in all four abdominal quadrants.  COMPARISON:  None.  FINDINGS: The limited survey evaluation of all 4 abdominal quadrants shows a small amount of ascites in the left upper and lower quadrants.  IMPRESSION: Small amount of ascites in left upper and lower quadrants.   Electronically Signed   By: Myles Rosenthal   On: 04/06/2013 11:18   US Paracentesis  04/12/2013   CLINICAL DATA:  Sepsis, fever, ascites, abdominal pain, question spontaneous bacterial peritonitis  EXAM: ULTRASOUND GUIDED DIAGNOSTIC PARACENTESIS  COMPARISON:  04/06/2013  FINDINGS: Procedure, benefits, and risks of procedure were discussed with patient.  Written informed consent for procedure was obtained.  Time out protocol followed.  Small collection of ascites localized in lateral left mid abdomen.  Skin prepped and draped in usual sterile fashion.  Skin and soft tissues anesthetized with 10 mL of 1% lidocaine.  5 [place] Yueh catheter placed  into peritoneal cavity.  120 mL of yellow fluid aspirated by syringe.  Procedure tolerated well by patient without immediate complication.  IMPRESSION: Successful ultrasound guided paracentesis yielding 120 mL of yellow ascites fluid for laboratory analysis.   Electronically Signed   By: Ulyses Southward M.D.   On: 04/12/2013 16:41   Dg Chest Port 1 View  04/07/2013   CLINICAL DATA:  Leukocytosis.  EXAM: PORTABLE CHEST - 1 VIEW  COMPARISON:  04/04/2013  FINDINGS: Small left pleural effusion is stable. Cardiomegaly is stable as well as chronic interstitial prominence. No acute infiltrate seen. Pulmonary hyperinflation again noted.  IMPRESSION: Stable cardiomegaly, COPD, and small right pleural effusion.   Electronically Signed   By: Myles Rosenthal   On: 04/07/2013 08:27   Dg Chest Port 1 View  03/26/2013   *RADIOLOGY REPORT*  Clinical Data: Chest pain and shortness of breath  PORTABLE CHEST - 1 VIEW  Comparison: 03/18/2013  Findings: Chronically prominent interstitial markings noted without focal opacity or evidence for edema.  Trace pleural effusions or thickening again noted.  No new focal pulmonary opacity.  No pneumothorax.  No acute osseous finding.  IMPRESSION: Stable trace bilateral pleural effusions or thickening.  CT is scheduled for later today and will be dictated under separate report.   Original Report Authenticated By: Christiana Pellant, M.D.   Dg Chest Port 1 View  03/18/2013   CLINICAL DATA:  Abdominal pain. Shortness of breath.  EXAM: PORTABLE CHEST - 1 VIEW  COMPARISON:  03/09/2013  FINDINGS: Emphysema noted with stable right pleural thickening. Indistinct opacity medially at the right lung base. Heart size within normal limits for technique. Atherosclerotic aortic arch noted. Mild interstitial accentuation noted bilaterally, likely chronic.  IMPRESSION: 1. Suspected small right pleural effusion. 2. Emphysema. 3. Ill-defined density at the right medial lung base. This has been previously characterized  as rounded atelectasis on prior CT scans.   Electronically Signed   By: Herbie Baltimore   On: 03/18/2013 20:13   Dg Abd Acute W/chest  04/04/2013   CLINICAL DATA:  Right upper quadrant pain, history dialysis, hypertension, alcoholic liver disease, diabetes mellitus, hepatic encephalopathy  EXAM: ACUTE ABDOMEN SERIES (ABDOMEN 2 VIEW & CHEST 1 VIEW)  COMPARISON:  Chest radiograph  03/26/2013, abdominal radiographs 02/18/2013  FINDINGS: Mild enlargement of cardiac silhouette.  Mediastinal contours and pulmonary vascularity normal.  Bibasilar atelectasis with bronchitic and probable underlying emphysematous changes.  Remaining lungs clear.  Small right pleural effusion noted.  TIPS shunt in right upper quadrant.  Examination limited by body habitus.  Probable ascites.  Nonobstructive bowel gas pattern with scattered mildly increased stool throughout colon.  No bowel dilatation, bowel wall thickening or obvious free intraperitoneal air.  Bones demineralized.  IMPRESSION: Probable COPD changes with minimal bibasilar atelectasis and small right pleural effusion.  Probable ascites.  Nonobstructive bowel gas pattern with increased stool throughout colon.   Electronically Signed   By: Ulyses Southward M.D.   On: 04/04/2013 15:14    Microbiology: Recent Results (from the past 240 hour(s))  CULTURE, BLOOD (ROUTINE X 2)     Status: None   Collection Time    04/07/13 12:47 PM      Result Value Range Status   Specimen Description BLOOD RIGHT HAND   Final   Special Requests BOTTLES DRAWN AEROBIC ONLY 10CC   Final   Culture  Setup Time     Final   Value: 04/08/2013 20:10     Performed at [facility]   Culture     Final   Value: ACINETOBACTER CALCOACETICUS/BAUMANNII COMPLEX     Note: SUSCEPTIBILITIES PERFORMED ON PREVIOUS CULTURE WITHIN THE LAST 5 DAYS.     Note: CORRECTED REPORT CALLED TO MARGARET BAUGHAM 04/09/13 1045 BY SMITHERSJ     Performed at [facility]   Report Status 04/11/2013 FINAL    Final  CULTURE, BLOOD (ROUTINE X 2)     Status: None   Collection Time    04/07/13  1:02 PM      Result Value Range Status   Specimen Description BLOOD RIGHT HAND   Final   Special Requests BOTTLES DRAWN AEROBIC ONLY 10CC   Final   Culture  Setup Time     Final   Value: 04/08/2013 20:10     Performed at [facility]   Culture     Final   Value: ACINETOBACTER CALCOACETICUS/BAUMANNII COMPLEX     Note: COLISTIN 0.50ug/ml ETEST results for this drug are "FOR INVESTIGATIONAL USE ONLY" and should NOT be used for clinical purposes.     Note: CORRECTED REPORT CALLED TO MARGARET BAUGHAM AT [facility] 04/09/13 1045     Performed at [facility]   Report Status 04/11/2013 FINAL   Final   Organism ID, Bacteria ACINETOBACTER CALCOACETICUS/BAUMANNII COMPLEX   Final  CULTURE, BLOOD (ROUTINE X 2)     Status: None   Collection Time    04/08/13 12:04 PM      Result Value Range Status   Specimen Description BLOOD RIGHT HAND   Final   Special Requests BOTTLES DRAWN AEROBIC AND ANAEROBIC 8CC   Final   Culture NO GROWTH 5 DAYS   Final   Report Status 04/13/2013 FINAL   Final  CULTURE, BLOOD (ROUTINE X 2)     Status: None   Collection Time    04/08/13  4:27 PM      Result Value Range Status   Specimen Description BLOOD RIGHT HAND   Final   Special Requests BOTTLES DRAWN AEROBIC ONLY 6CC BOTTLE   Final   Culture NO GROWTH 5 DAYS   Final   Report Status 04/13/2013 FINAL   Final  ANAEROBIC CULTURE     Status: None   Collection Time  04/12/13 12:50 PM      Result Value Range Status   Specimen Description FLUID ASCITIC COLLECTED BY DOCTOR BOLES   Final   Special Requests NONE   Final   Gram Stain     Final   Value: RARE WBC PRESENT, PREDOMINANTLY PMN     NO ORGANISMS SEEN     Performed at [facility]   Culture     Final   Value: NO ANAEROBES ISOLATED; CULTURE IN PROGRESS FOR 5 DAYS     Performed at [facility]   Report Status PENDING   Incomplete  BODY FLUID CULTURE      Status: None   Collection Time    04/12/13 12:50 PM      Result Value Range Status   Specimen Description FLUID ASCITIC COLLECTED BY DOCTOR BOLES   Final   Special Requests NONE   Final   Gram Stain     Final   Value: RARE WBC PRESENT, PREDOMINANTLY PMN     NO ORGANISMS SEEN     Performed at [facility]   Culture     Final   Value: NO GROWTH 3 DAYS     Performed at [facility]   Report Status 04/15/2013 FINAL   Final     Labs: Basic Metabolic Panel:  Recent Labs Lab 04/11/13 0520 04/12/13 0013 04/13/13 0555 04/14/13 0732  NA 135  --  137 134*  K 4.6  --  3.8 4.3  CL 101  --  100 99  CO2 28  --  28 26  GLUCOSE 171*  --  252* 261*  BUN 33*  --  32* 29*  CREATININE 4.62*  --  3.98* 3.53*  CALCIUM 8.2*  --  8.5 7.8*  PHOS 3.0 2.8  --   --    Liver Function Tests: No results found for this basename: AST, ALT, ALKPHOS, BILITOT, PROT, ALBUMIN,  in the last 168 hours No results found for this basename: LIPASE, AMYLASE,  in the last 168 hours No results found for this basename: AMMONIA,  in the last 168 hours CBC:  Recent Labs Lab 04/11/13 0520  04/12/13 0535 04/12/13 1144 04/12/13 1859 04/13/13 0555 04/14/13 0732  WBC 6.7  --  8.5  --   --   --  8.0  HGB 7.2*  < > 9.1* 9.1* 8.6* 8.1* 6.5*  HCT 22.2*  < > 27.6* 27.4* 25.5* 24.6* 19.4*  MCV 92.9  --  92.6  --   --   --  92.4  PLT 120*  --  100*  --   --   --  91*  < > = values in this interval not displayed. Cardiac Enzymes: No results found for this basename: CKTOTAL, CKMB, CKMBINDEX, TROPONINI,  in the last 168 hours BNP: BNP (last 3 results)  Recent Labs  11/21/12 1145  PROBNP 1167.0*   CBG:  Recent Labs Lab 04/14/13 1635 04/14/13 2112 04/15/13 0747 04/15/13 1136 04/15/13 1629  GLUCAP 329* 274* 205* 214* 292*       Signed:  Jonette Mate N  [facility] 04/16/2013, 1:44 PM

## 2013-04-16 NOTE — Progress Notes (Signed)
04/16/13 1706 received call from social work regarding patient discharge. [facility] physician preferred to have patient on Roxanol for pain instead of morphine IV at facility. Notified Dr. York Spaniel, stated okay for patient to discharge and be on Roxanol as ordered per facility MD.  Order received to d/c IV access. IV site d/c'd, within normal limits. Facility aware of IV access d/c'd. Stated okay. Pt left floor in stable condition via EMS transport. Earnstine Regal, RN

## 2013-04-16 NOTE — Progress Notes (Signed)
Nutrition Brief Note  Chart reviewed. Pt now transitioning to comfort care.  No further nutrition interventions warranted at this time.  Please re-consult as needed.   Lynn Guinevere Stephenson MS,RD,CSG,LDN Office: #[phone] Pager: #[phone]    

## 2013-04-16 NOTE — Progress Notes (Signed)
04/16/13 1643 Patient c/o abdominal pain rated 10/10 after receiving morphine 2 mg IV this afternoon for pain. Requested oxycodone for pain. Notified Dr. York Spaniel of patient persistent pain, stated okay to give dose of oxycodone early since not due again until 1700.  Patient asleep on reassessment, oxycodone not given at this time. Earnstine Regal, RN

## 2013-04-16 NOTE — Clinical Social Work Note (Addendum)
Pt made comfort care over weekend and referral faxed to hospice then. He will be d/c today back to [facility]. CSW spoke with pt's son Timothy Chan who requested [facility]ospice [facility]. No beds available in [place]. Referral faxed to [facility] in [place] and no report yet whether they could accept pt. Notified pt as well. He is agreeable to return to [facility] and did not want to consider other hospice facilities. Pt's son plans to follow up with hospice to transfer when bed is available. CSW notified [facility] and he will get in touch with [facility]. [facility] is aware of peripheral IV for PRN morphine and feel they can handle this. D/C summary faxed. Out of facility DNR sent with pt. Pt will transfer via [facility].   Derenda Fennel, [place] [phone]

## 2013-04-17 LAB — ANAEROBIC CULTURE

## 2013-04-18 ENCOUNTER — Emergency Department (HOSPITAL_COMMUNITY): Payer: Medicare Other

## 2013-04-18 ENCOUNTER — Encounter (HOSPITAL_COMMUNITY): Payer: Self-pay | Admitting: Emergency Medicine

## 2013-04-18 ENCOUNTER — Inpatient Hospital Stay (HOSPITAL_COMMUNITY)
Admission: EM | Admit: 2013-04-18 | Discharge: 2013-05-12 | DRG: 871 | Disposition: E | Payer: Medicare Other | Attending: Internal Medicine | Admitting: Internal Medicine

## 2013-04-18 DIAGNOSIS — Z8249 Family history of ischemic heart disease and other diseases of the circulatory system: Secondary | ICD-10-CM

## 2013-04-18 DIAGNOSIS — J9600 Acute respiratory failure, unspecified whether with hypoxia or hypercapnia: Secondary | ICD-10-CM | POA: Diagnosis present

## 2013-04-18 DIAGNOSIS — K7290 Hepatic failure, unspecified without coma: Secondary | ICD-10-CM | POA: Diagnosis present

## 2013-04-18 DIAGNOSIS — D500 Iron deficiency anemia secondary to blood loss (chronic): Secondary | ICD-10-CM | POA: Diagnosis present

## 2013-04-18 DIAGNOSIS — E8779 Other fluid overload: Secondary | ICD-10-CM | POA: Diagnosis present

## 2013-04-18 DIAGNOSIS — N186 End stage renal disease: Secondary | ICD-10-CM | POA: Diagnosis present

## 2013-04-18 DIAGNOSIS — K7030 Alcoholic cirrhosis of liver without ascites: Secondary | ICD-10-CM | POA: Diagnosis present

## 2013-04-18 DIAGNOSIS — R0603 Acute respiratory distress: Secondary | ICD-10-CM

## 2013-04-18 DIAGNOSIS — Z87891 Personal history of nicotine dependence: Secondary | ICD-10-CM

## 2013-04-18 DIAGNOSIS — Z515 Encounter for palliative care: Secondary | ICD-10-CM

## 2013-04-18 DIAGNOSIS — F1020 Alcohol dependence, uncomplicated: Secondary | ICD-10-CM | POA: Diagnosis present

## 2013-04-18 DIAGNOSIS — Z8349 Family history of other endocrine, nutritional and metabolic diseases: Secondary | ICD-10-CM

## 2013-04-18 DIAGNOSIS — Z66 Do not resuscitate: Secondary | ICD-10-CM | POA: Diagnosis present

## 2013-04-18 DIAGNOSIS — K922 Gastrointestinal hemorrhage, unspecified: Secondary | ICD-10-CM | POA: Diagnosis present

## 2013-04-18 DIAGNOSIS — K7682 Hepatic encephalopathy: Secondary | ICD-10-CM | POA: Diagnosis present

## 2013-04-18 DIAGNOSIS — D62 Acute posthemorrhagic anemia: Secondary | ICD-10-CM | POA: Diagnosis present

## 2013-04-18 DIAGNOSIS — A419 Sepsis, unspecified organism: Principal | ICD-10-CM | POA: Diagnosis present

## 2013-04-18 DIAGNOSIS — K769 Liver disease, unspecified: Secondary | ICD-10-CM | POA: Diagnosis present

## 2013-04-18 DIAGNOSIS — K31819 Angiodysplasia of stomach and duodenum without bleeding: Secondary | ICD-10-CM | POA: Diagnosis present

## 2013-04-18 DIAGNOSIS — I120 Hypertensive chronic kidney disease with stage 5 chronic kidney disease or end stage renal disease: Secondary | ICD-10-CM | POA: Diagnosis present

## 2013-04-18 DIAGNOSIS — Z992 Dependence on renal dialysis: Secondary | ICD-10-CM

## 2013-04-18 DIAGNOSIS — E871 Hypo-osmolality and hyponatremia: Secondary | ICD-10-CM | POA: Diagnosis present

## 2013-04-18 DIAGNOSIS — Z8701 Personal history of pneumonia (recurrent): Secondary | ICD-10-CM

## 2013-04-18 DIAGNOSIS — E875 Hyperkalemia: Secondary | ICD-10-CM | POA: Diagnosis present

## 2013-04-18 DIAGNOSIS — E119 Type 2 diabetes mellitus without complications: Secondary | ICD-10-CM | POA: Diagnosis present

## 2013-04-18 LAB — COMPREHENSIVE METABOLIC PANEL
ALT: 19 U/L (ref 0–53)
AST: 97 U/L — ABNORMAL HIGH (ref 0–37)
Alkaline Phosphatase: 244 U/L — ABNORMAL HIGH (ref 39–117)
CO2: 19 mEq/L (ref 19–32)
Calcium: 9 mg/dL (ref 8.4–10.5)
Chloride: 93 mEq/L — ABNORMAL LOW (ref 96–112)
Creatinine, Ser: 6.36 mg/dL — ABNORMAL HIGH (ref 0.50–1.35)
GFR calc Af Amer: 10 mL/min — ABNORMAL LOW (ref >90)
GFR calc non Af Amer: 8 mL/min — ABNORMAL LOW (ref >90)
Glucose, Bld: 238 mg/dL — ABNORMAL HIGH (ref 70–99)
Potassium: 5.6 mEq/L — ABNORMAL HIGH (ref 3.5–5.1)
Sodium: 132 mEq/L — ABNORMAL LOW (ref 135–145)
Total Bilirubin: 0.9 mg/dL (ref 0.3–1.2)

## 2013-04-18 LAB — URINALYSIS, ROUTINE W REFLEX MICROSCOPIC
Glucose, UA: 100 mg/dL — AB
Nitrite: NEGATIVE
Protein, ur: 30 mg/dL — AB
Specific Gravity, Urine: 1.025 (ref 1.005–1.030)
pH: 5.5 (ref 5.0–8.0)

## 2013-04-18 LAB — PRO B NATRIURETIC PEPTIDE: Pro B Natriuretic peptide (BNP): 1756 pg/mL — ABNORMAL HIGH (ref 0–125)

## 2013-04-18 LAB — PROTIME-INR
INR: 1.5 — ABNORMAL HIGH (ref 0.00–1.49)
Prothrombin Time: 17.7 seconds — ABNORMAL HIGH (ref 11.6–15.2)

## 2013-04-18 LAB — CBC WITH DIFFERENTIAL/PLATELET
Basophils Relative: 0 % (ref 0–1)
HCT: 15.4 % — ABNORMAL LOW (ref 39.0–52.0)
Hemoglobin: 5.1 g/dL — CL (ref 13.0–17.0)
Lymphs Abs: 2 10*3/uL (ref 0.7–4.0)
Monocytes Absolute: 3.1 10*3/uL — ABNORMAL HIGH (ref 0.1–1.0)
Monocytes Relative: 7 % (ref 3–12)
Neutro Abs: 41.1 10*3/uL — ABNORMAL HIGH (ref 1.7–7.7)
Neutrophils Relative %: 89 % — ABNORMAL HIGH (ref 43–77)
RBC: 1.56 MIL/uL — ABNORMAL LOW (ref 4.22–5.81)

## 2013-04-18 LAB — URINE MICROSCOPIC-ADD ON

## 2013-04-18 MED ORDER — ONDANSETRON HCL 4 MG PO TABS: 4.0000 mg | ORAL_TABLET | Freq: Four times a day (QID) | ORAL | Status: DC | PRN

## 2013-04-18 MED ORDER — HYDROMORPHONE HCL PF 1 MG/ML IJ SOLN
1.0000 mg | Freq: Once | INTRAMUSCULAR | Status: AC
  Administered 2013-04-18: 1 mg via INTRAVENOUS
  Filled 2013-04-18: qty 1

## 2013-04-18 MED ORDER — ACETAMINOPHEN 650 MG RE SUPP: 650.0000 mg | Freq: Four times a day (QID) | RECTAL | Status: DC | PRN

## 2013-04-18 MED ORDER — ALBUTEROL SULFATE (5 MG/ML) 0.5% IN NEBU
2.5000 mg | INHALATION_SOLUTION | RESPIRATORY_TRACT | Status: DC | PRN
  Filled 2013-04-18: qty 0.5

## 2013-04-18 MED ORDER — ATROPINE SULFATE 1 % OP SOLN
3.0000 [drp] | Freq: Four times a day (QID) | OPHTHALMIC | Status: DC
  Filled 2013-04-18: qty 2

## 2013-04-18 MED ORDER — IPRATROPIUM BROMIDE 0.02 % IN SOLN
0.5000 mg | Freq: Four times a day (QID) | RESPIRATORY_TRACT | Status: DC
  Filled 2013-04-18: qty 2.5

## 2013-04-18 MED ORDER — ONDANSETRON HCL 4 MG/2ML IJ SOLN: 4.0000 mg | Freq: Four times a day (QID) | INTRAMUSCULAR | Status: DC | PRN

## 2013-04-18 MED ORDER — HYDROMORPHONE HCL PF 1 MG/ML IJ SOLN: 1.0000 mg | INTRAMUSCULAR | Status: DC | PRN

## 2013-04-18 MED ORDER — ACETAMINOPHEN 325 MG PO TABS: 650.0000 mg | ORAL_TABLET | Freq: Four times a day (QID) | ORAL | Status: DC | PRN

## 2013-04-18 MED ORDER — VANCOMYCIN HCL IN DEXTROSE 1-5 GM/200ML-% IV SOLN: 1000.0000 mg | Freq: Once | INTRAVENOUS | Status: DC

## 2013-04-18 MED ORDER — PIPERACILLIN-TAZOBACTAM 3.375 G IVPB: 3.3750 g | Freq: Once | INTRAVENOUS | Status: DC

## 2013-04-21 LAB — BLOOD GAS, ARTERIAL
Bicarbonate: 19.8 mEq/L — ABNORMAL LOW (ref 20.0–24.0)
pH, Arterial: 7.319 — ABNORMAL LOW (ref 7.350–7.450)
pO2, Arterial: 24.9 mmHg — CL (ref 80.0–100.0)

## 2013-04-22 LAB — URINE CULTURE

## 2013-05-12 NOTE — Progress Notes (Signed)
Nutrition Brief Note  RD pulled to pt due to Malnutrition Screen Score. Known to dietitians from recent admission. Chart reviewed. Pt is comfort care.  No further nutrition interventions warranted at this time.  Please re-consult as needed.   Royann Shivers MS,RD,CSG,LDN Office: [phone] Pager: [phone]

## 2013-05-12 NOTE — ED Notes (Signed)
Patient is a resident at [facility]; patient presents to ER via [facility] in respiratory distress and edema noted all over.  Patient has weeping from bilateral legs with large blister on lower left leg.  Patient has 3+ pitting edema in bilateral legs, feet and arms.  Patient on NRB at 15L/min.

## 2013-05-12 NOTE — ED Provider Notes (Addendum)
CSN: [id number]     Arrival date & time Apr 21, 2013  0245 History   First MD Initiated Contact with Patient 2013-04-21 0302     Chief Complaint  Patient presents with  . Respiratory Distress   (Consider location/radiation/quality/duration/timing/severity/associated sxs/prior Treatment) HPI Patient presents in extremis from his nursing facility.  Per report the patient had increasing dyspnea, decreased interactivity over the past days. Patient is nonverbal on arrival.  He is incapable of providing any details of the history of present illness. This is a level V caveat. Quick chart review prior to resuscitation demonstrates the patient has previously been DO NOT RESUSCITATE, has history of encephalopathy, liver and kidney disease. Nursing home notes do not state the patient still is DO NOT RESUSCITATE.    Past Medical History  Diagnosis Date  . Rectal bleeding   . Dialysis care   . GI bleed   . Hypertension   . Esophageal varices   . Alcoholic liver disease   . Hepatic encephalopathy   . Ascites   . Diabetes mellitus   . Detached retina   . Pneumonia   . 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 ESRD on hemodialysis    Past Surgical History  Procedure Laterality Date  . Esophagogastroduodenoscopy  12/31/2010    EGD [facility] THERAPY  . Esophagogastroduodenoscopy  05/31/2012E    GD [facility] ABLATION  . Small bowel givens  12/01/2010  . Colonoscopy  09/07/2010  . Esophagogastroduodenoscopy  09/05/2010    OUTLAW  . Lung surgery  6/11    [place]  . Esophagogastroduodenoscopy  03/26/2011    Procedure: ESOPHAGOGASTRODUODENOSCOPY (EGD);  Surgeon: Malissa Hippo, MD;  Location: [facility];  Service: Endoscopy;  Laterality: N/A;  8:30 am  . Hot hemostasis  03/26/2011    Procedure: HOT HEMOSTASIS (ARGON PLASMA COAGULATION/BICAP);  Surgeon: Malissa Hippo, MD;  Location: [facility];  Service: Endoscopy;  Laterality: N/A;  . Stent in liver    .  Esophagogastroduodenoscopy N/A 10/24/2012    Procedure: ESOPHAGOGASTRODUODENOSCOPY (EGD);  Surgeon: Malissa Hippo, MD;  [facility];  Service: Endoscopy;  Laterality: N/A;  . Esophagogastroduodenoscopy N/A 11/22/2012    Procedure: ESOPHAGOGASTRODUODENOSCOPY (EGD);  Surgeon: Charna Elizabeth, MD;  Location: [facility] ENDOSCOPY;  Service: Endoscopy;  Laterality: N/A;  . Paracentesis    . Esophagogastroduodenoscopy N/A 12/21/2012    Procedure: ESOPHAGOGASTRODUODENOSCOPY (EGD);  Surgeon: Malissa Hippo, MD;  [facility];  Service: Endoscopy;  Laterality: N/A;  250-moved to 155 Ann to notify pt  . Hot hemostasis N/A 12/21/2012    Procedure: HOT HEMOSTASIS (ARGON PLASMA COAGULATION/BICAP);  Surgeon: Malissa Hippo, MD;  [facility];  Service: Endoscopy;  Laterality: N/A;  . Esophagogastroduodenoscopy N/A 01/25/2013    Procedure: ESOPHAGOGASTRODUODENOSCOPY (EGD);  Surgeon: Malissa Hippo, MD;  [facility];  Service: Endoscopy;  Laterality: N/A;  100  . Hot hemostasis N/A 01/25/2013    Procedure: HOT HEMOSTASIS (ARGON PLASMA COAGULATION/BICAP);  Surgeon: Malissa Hippo, MD;  [facility];  Service: Endoscopy;  Laterality: N/A;  . Esophagogastroduodenoscopy N/A 02/16/2013    Procedure: ESOPHAGOGASTRODUODENOSCOPY (EGD);  Surgeon: Malissa Hippo, MD;  [facility];  Service: Endoscopy;  Laterality: N/A;  . Esophagogastroduodenoscopy N/A 03/07/2013    Procedure: ESOPHAGOGASTRODUODENOSCOPY (EGD);  Surgeon: Malissa Hippo, MD;  [facility];  Service: Endoscopy;  Laterality: N/A;  . Esophagogastroduodenoscopy N/A 03/09/2013    Procedure: ESOPHAGOGASTRODUODENOSCOPY (EGD);  Surgeon:  Malissa Hippo, MD;  [facility];  Service: Endoscopy;  Laterality: N/A;  . Esophagogastroduodenoscopy N/A 04/12/2013    Procedure: ESOPHAGOGASTRODUODENOSCOPY (EGD);  Surgeon: Malissa Hippo, MD;  [facility];  Service: Endoscopy;  Laterality:  N/A;   Family History  Problem Relation Age of Onset  . Kidney failure Neg Hx   . CAD Father   . High blood pressure Brother   . Gout Brother    History  Substance Use Topics  . Smoking status: Former Smoker -- 1.00 packs/day for 30 years    Types: Cigarettes    Quit date: 08/11/2011  . Smokeless tobacco: Former Neurosurgeon    Quit date: 05/12/2012  . Alcohol Use: No     Comment: denies-quit 2000    Review of Systems  Unable to perform ROS: Acuity of condition    Allergies  Tylenol  Home Medications   Current Outpatient Rx  Name  Route  Sig  Dispense  Refill  . haloperidol (HALDOL) 2 MG tablet   Oral   Take 1 tablet (2 mg total) by mouth 2 (two) times daily as needed.   20 tablet   0   . hydrocortisone valerate ointment (WESTCORT) 0.2 %   Topical   Apply 1 application topically every Monday, Wednesday, and Friday. For numbing prior to dialysis         . lactulose (CHRONULAC) 10 GM/15ML solution   Oral   Take 45 mLs (30 g total) by mouth 2 (two) times daily.   240 mL   0   . LORazepam (ATIVAN) 1 MG tablet   Oral   Take 1 tablet (1 mg total) by mouth every 8 (eight) hours.   20 tablet   0   . metoCLOPramide (REGLAN) 5 MG tablet   Oral   Take 1 tablet (5 mg total) by mouth 4 (four) times daily -  before meals and at bedtime.         [name] morphine 2 MG/ML injection   Intravenous   Inject 1 mL (2 mg total) into the vein every 4 (four) hours as needed.   1 mL   0   . ondansetron (ZOFRAN) 4 MG tablet   Oral   Take 4 mg by mouth daily as needed for nausea.          [name] oxyCODONE (ROXICODONE) 5 MG/5ML solution   Oral   Take 5 mLs (5 mg total) by mouth every 4 (four) hours as needed for pain.   30 mL   0   . pantoprazole (PROTONIX) 40 MG tablet   Oral   Take 1 tablet (40 mg total) by mouth 2 (two) times daily before a meal.   120 tablet   2    BP 148/54  Pulse 27  Resp 28  SpO2 97% Physical Exam  Nursing note and vitals reviewed. Constitutional:   Grossly edematous male with weeping legs bilaterally, significant pitting edema, open mouth breathing, with gurgling sounds  HENT:  Head: Atraumatic.  Pale, dry mucous membranes  Eyes: Right eye exhibits no discharge. Left eye exhibits no discharge.  Neck: No tracheal deviation present.  Cardiovascular: Regular rhythm.  Tachycardia present.   Pulmonary/Chest: Accessory muscle usage present. No stridor. Tachypnea noted. He is in respiratory distress. He has decreased breath sounds. He has wheezes.  Abdominal: He exhibits no distension.  Musculoskeletal:  No gross deformities, though all extremities are diffusely edematous  Neurological:  Patient is encephalopathic like, though  he moves all extremities spontaneously, minimally, he does not interact for the neurologic exam  Psychiatric:  Patient is noncommunicative    ED Course  Procedures (including critical care time) Labs Review Labs Reviewed  COMPREHENSIVE METABOLIC PANEL - Abnormal; Notable for the following:    Sodium 132 (*)    Potassium 5.6 (*)    Chloride 93 (*)    Glucose, Bld 238 (*)    BUN 64 (*)    Creatinine, Ser 6.36 (*)    Total Protein 5.5 (*)    Albumin 1.0 (*)    AST 97 (*)    Alkaline Phosphatase 244 (*)    GFR calc non Af Amer 8 (*)    GFR calc Af Amer 10 (*)    All other components within normal limits  LACTIC ACID, PLASMA - Abnormal; Notable for the following:    Lactic Acid, Venous 9.0 (*)    All other components within normal limits  PROTIME-INR - Abnormal; Notable for the following:    Prothrombin Time 17.7 (*)    INR 1.50 (*)    All other components within normal limits  URINALYSIS, ROUTINE W REFLEX MICROSCOPIC - Abnormal; Notable for the following:    Glucose, UA 100 (*)    Hgb urine dipstick SMALL (*)    Bilirubin Urine SMALL (*)    Ketones, ur TRACE (*)    Protein, ur 30 (*)    Leukocytes, UA SMALL (*)    All other components within normal limits  CBC WITH DIFFERENTIAL - Abnormal; Notable  for the following:    WBC 46.3 (*)    RBC 1.56 (*)    Hemoglobin 5.1 (*)    HCT 15.4 (*)    RDW 20.7 (*)    Neutrophils Relative % 89 (*)    Neutro Abs 41.1 (*)    Lymphocytes Relative 4 (*)    Monocytes Absolute 3.1 (*)    All other components within normal limits  PRO B NATRIURETIC PEPTIDE - Abnormal; Notable for the following:    Pro B Natriuretic peptide (BNP) 1756.0 (*)    All other components within normal limits  URINE MICROSCOPIC-ADD ON - Abnormal; Notable for the following:    Bacteria, UA MANY (*)    All other components within normal limits  BLOOD GAS, ARTERIAL   Imaging Review Dg Chest Port 1 View  2013-04-23   *RADIOLOGY REPORT*  Clinical Data: Dyspnea; respiratory distress.  PORTABLE CHEST - 1 VIEW  Comparison: Chest radiograph performed 04/07/2013  Findings: The lungs are relatively well-aerated.  Vascular congestion is noted, with increased interstitial markings, likely reflecting slightly worsened interstitial edema.  Small bilateral pleural effusions are suspected.  No pneumothorax is seen.  The cardiomediastinal silhouette is borderline enlarged.  No acute osseous abnormalities identified.  IMPRESSION: Vascular congestion and borderline cardiomegaly, with increased interstitial markings, likely reflecting slightly worsened interstitial edema.  Suspect small bilateral pleural effusions.   Original Report Authenticated By: Tonia Ghent, M.D.   Dg Chest Port 1v Same Day  April 23, 2013   *RADIOLOGY REPORT*  Clinical Data: Central line placement.  PORTABLE CHEST - 1 VIEW SAME DAY  Comparison: Chest radiograph performed earlier today at 03:29 a.m.  Findings: The patient's left IJ line is noted ending about the proximal SVC.  There is mildly worsened bibasilar airspace opacity, though this may reflect technique.  This could reflect mildly worsened interstitial edema or possibly pneumonia.  Underlying vascular congestion is noted.  Small bilateral pleural effusions are again  suspected,  though the left lung base is not completely imaged.  The heart mediastinal silhouette is borderline enlarged.  No acute osseous abnormalities are seen.  IMPRESSION:  1.  Left IJ line noted ending about the proximal SVC. 2.  Apparent mildly worsened bibasilar airspace opacities may reflect mildly worse interstitial edema or possibly pneumonia. 3.  Underlying vascular congestion and borderline cardiomegaly; likely small bilateral pleural effusions.   Original Report Authenticated By: Tonia Ghent, M.D.   On arrival I discussed the patient's case with the EMS providers. Immediately thereafter, we attempted to obtain IV access in this patient.  This was complicated, but the diffuse edema, is medical issues.  Soon after the arrival, discussed the patient's case with his son.  The son states that the patient should not be intubated, per his wish.  Update: Patient remained tachycardic, tachypneic, with notable secretions. His requires suctioning several times.  Attempts to obtain blood for lab studies were unsuccessful, including arterial blood draw attempt.  Update: I obtained IV access via central line catheterization due to inability to cannulate any peripheral veins, even with ultrasound access.   Immediately after the  CENTRAL LINE Performed by: Gerhard Munch Consent: The procedure was performed in an emergent situation. Required items: required blood products, implants, devices, and special equipment available Patient identity confirmed: arm band and provided demographic data Time out: Immediately prior to procedure a "time out" was called to verify the correct patient, procedure, equipment, support staff and site/side marked as required. Indications: vascular access Anesthesia: local infiltration Local anesthetic: lidocaine 1% with epinephrine Anesthetic total: 3 ml Patient sedated: no Preparation: skin prepped with 2% chlorhexidine Skin prep agent dried: skin prep agent  completely dried prior to procedure Sterile barriers: all five maximum sterile barriers used - cap, mask, sterile gown, sterile gloves, and large sterile sheet Hand hygiene: hand hygiene performed prior to central venous catheter insertion  Location details: L IJ  Catheter type: triple lumen Catheter size: 8 Fr Pre-procedure: landmarks identified Ultrasound guidance: yes Successful placement: yes Post-procedure: line sutured and dressing applied Assessment: blood return through all parts, free fluid flow, placement verified by x-ray and no pneumothorax on x-ray Patient tolerance: Patient tolerated the procedure well with no immediate complications.   Update: Patient's initial blood work is notable for lactic acidosis, anemia, leukocytosis.  Subsequently I discussed the patient's case with his son, and nursing home staff.  Then I discussed the patient's case with our admitting hospitalist. Though the patient's son states the patient is DO NOT RESUSCITATE, should not be resuscitated, and comfort care measures are his request, the patient was at the nursing home and had not completed formal designation as a DO NOT RESUSCITATE candidate there by a staff M.D., thus he was transferred here for evaluation. Again, I confirmed the patient's DO NOT RESUSCITATE status with the son.   5:40 AM I discussed lab results, the patient's course with his son.  The patient's son confirms that the patient is DO NOT RESUSCITATE, and no new blood, no antibiotics will be performed, given the patient's clinical state.   Cardiac monitor 121 sinus tachycardia abnormal Pulse ox and she was hypoxic, 60s, on room air, but improved to the ED is with supplemental oxygen.  EKG had sinus tachycardia, rate 126, T wave changes, artifact, abnormal  MDM   1. Respiratory distress    This patient presents in extremis, respiratory distress.  Notably, the patient has multiple medical problems, including hepatic and renal  disease.  Patient had recent pneumonia.  After initial resuscitation, placement of central line, patient's goals of care became apparent after multiple conversations with family members, nursing home staff.  The patient has multiple critical abnormalities, additional resuscitative measures will not be provided, and the patient's prognosis is poor.  The patient was admitted to the comfort care service/hospitalist's. Patient's son will be arriving in the near future.  Son Jaser Fullen [phone])  CRITICAL CARE Performed by: Gerhard Munch Total critical care time: 35 Critical care time was exclusive of separately billable procedures and treating other patients. Critical care was necessary to treat or prevent imminent or life-threatening deterioration. Critical care was time spent personally by me on the following activities: development of treatment plan with patient and/or surrogate as well as nursing, discussions with consultants, evaluation of patient's response to treatment, examination of patient, obtaining history from patient or surrogate, ordering and performing treatments and interventions, ordering and review of laboratory studies, ordering and review of radiographic studies, pulse oximetry and re-evaluation of patient's condition.     Gerhard Munch, MD 05/05/13 3086  Gerhard Munch, MD 05-05-13 [phone]

## 2013-05-12 NOTE — ED Notes (Signed)
Pt resting.  Respirations continued to be labored.  EKG completed, central line placed, foley cath inserted.

## 2013-05-12 NOTE — Progress Notes (Signed)
Pt time of death as 1157.  Family noticed pt appeared to have stopped breathing and called RN to room.  Lyda Jester, RN assisted in confirming pt status.  Pt was not breathing and had no pulse.  Family was in room when confirmed.

## 2013-05-12 NOTE — ED Notes (Signed)
CRITICAL VALUE ALERT  Critical value received:  Hbg 5.1 Date of notification:  05-04-2013  Time of notification: 0500 Critical value read back:yes Nurse who received alert:  Jeannette How, RN  MD notified:  Dr. Jeraldine Loots  Time:  0500

## 2013-05-12 NOTE — Progress Notes (Signed)
Inpatient Diabetes Program Recommendations  AACE/ADA: New Consensus Statement on Inpatient Glycemic Control (2013)  Target Ranges:  Prepandial:   less than 140 mg/dL      Peak postprandial:   less than 180 mg/dL (1-2 hours)      Critically ill patients:  140 - 180 mg/dL   Results for HO, PARISI (MRN [id number]) as of Apr 21, 2013 08:05  Ref. Range 04-21-13 02:56  Glucose Latest Range: 70-99 mg/dL 409 (H)    Inpatient Diabetes Program Recommendations Correction (SSI): If appropriate for patient circumstances, please consider ordering CBGs and Novolog correction scale Q4H while inpatient.  Note: Patient has a history of diabetes.  However, when patient was discharged from [facility] on 10/6 he was not discharged on any medications for diabetes control as he was made comfort care with Hospice care at [facility].  Initial blood glucose on labs was 238 mg/dl this morning at 8:11.  If appropriate for patient circumstances, please consider ordering CBGs and Novolog correction scale Q4H while inpatient.  Will continue to follow.  Thanks, Orlando Penner, RN, MSN, CCRN Diabetes Coordinator Inpatient Diabetes Program [phone] (Team Pager) [phone] ([facility] office) [phone] [facility] office)

## 2013-05-12 NOTE — H&P (Signed)
[facility] History and Physical  [name]:[id number] DOB: 23-Aug-1950 DOA: 04/29/13  Referring physician: Dr. Jeraldine Loots PCP: Ignatius Specking., MD  Specialists:   Chief Complaint: unresponsive  HPI: Timothy Chan is a 62 y.o. male who is well-known to our service, with history of end-stage liver disease, end-stage renal disease, chronic GI blood loss with transfusion-dependent anemia. The patient was recently in the hospital and was discharged on 10/6 back to his nursing home for comfort measures. His family reports that he was conversing appropriately at that time. He did not complain of shortness of breath, did not have any fever. Over the last 48 hours, patient has become progressively lethargic. He is now unresponsive. According to the family, they were contacted last night by the nursing facility and were told the patient was having difficulty breathing and it was likely that he had respiratory secretions. Family requested transfer to the emergency room for further management, since they felt that this could not be managed at the nursing home. On arrival to the emergency room, patient was noted to be tachycardic, hypotensive, obtunded. Admission was requested for further comfort measures.  Review of Systems: unable to assess due to mental status  Past Medical History  Diagnosis Date  . Rectal bleeding   . Dialysis care   . GI bleed   . Hypertension   . Esophageal varices   . Alcoholic liver disease   . Hepatic encephalopathy   . Ascites   . Diabetes mellitus   . Detached retina   . Pneumonia   . 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 ESRD on hemodialysis    Past Surgical History  Procedure Laterality Date  . Esophagogastroduodenoscopy  12/31/2010    EGD [facility] THERAPY  . Esophagogastroduodenoscopy  05/31/2012E    GD [facility] ABLATION  . Small bowel givens  12/01/2010  . Colonoscopy  09/07/2010  . Esophagogastroduodenoscopy  09/05/2010    OUTLAW  .  Lung surgery  6/11    [place]  . Esophagogastroduodenoscopy  03/26/2011    Procedure: ESOPHAGOGASTRODUODENOSCOPY (EGD);  Surgeon: Malissa Hippo, MD;  Location: [facility];  Service: Endoscopy;  Laterality: N/A;  8:30 am  . Hot hemostasis  03/26/2011    Procedure: HOT HEMOSTASIS (ARGON PLASMA COAGULATION/BICAP);  Surgeon: Malissa Hippo, MD;  Location: [facility];  Service: Endoscopy;  Laterality: N/A;  . Stent in liver    . Esophagogastroduodenoscopy N/A 10/24/2012    Procedure: ESOPHAGOGASTRODUODENOSCOPY (EGD);  Surgeon: Malissa Hippo, MD;  Location: [facility];  Service: Endoscopy;  Laterality: N/A;  . Esophagogastroduodenoscopy N/A 11/22/2012    Procedure: ESOPHAGOGASTRODUODENOSCOPY (EGD);  Surgeon: Charna Elizabeth, MD;  Location: [facility] ENDOSCOPY;  Service: Endoscopy;  Laterality: N/A;  . Paracentesis    . Esophagogastroduodenoscopy N/A 12/21/2012    Procedure: ESOPHAGOGASTRODUODENOSCOPY (EGD);  Surgeon: Malissa Hippo, MD;  Location: [facility];  Service: Endoscopy;  Laterality: N/A;  250-moved to 155 Ann to notify pt  . Hot hemostasis N/A 12/21/2012    Procedure: HOT HEMOSTASIS (ARGON PLASMA COAGULATION/BICAP);  Surgeon: Malissa Hippo, MD;  Location: [facility];  Service: Endoscopy;  Laterality: N/A;  . Esophagogastroduodenoscopy N/A 01/25/2013    Procedure: ESOPHAGOGASTRODUODENOSCOPY (EGD);  Surgeon: Malissa Hippo, MD;  Location: [facility];  Service: Endoscopy;  Laterality: N/A;  100  . Hot hemostasis N/A 01/25/2013    Procedure: HOT HEMOSTASIS (ARGON PLASMA COAGULATION/BICAP);  Surgeon: Malissa Hippo, MD;  Location: [facility];  Service: Endoscopy;  Laterality: N/A;  . Esophagogastroduodenoscopy N/A 02/16/2013    Procedure: ESOPHAGOGASTRODUODENOSCOPY (EGD);  Surgeon: Malissa Hippo, MD;  Location: [facility];  Service: Endoscopy;  Laterality: N/A;  . Esophagogastroduodenoscopy N/A 03/07/2013    Procedure: ESOPHAGOGASTRODUODENOSCOPY (EGD);  Surgeon: Malissa Hippo, MD;  Location: [facility];  Service: Endoscopy;  Laterality: N/A;  . Esophagogastroduodenoscopy N/A 03/09/2013    Procedure: ESOPHAGOGASTRODUODENOSCOPY (EGD);  Surgeon: Malissa Hippo, MD;  Location: [facility];  Service: Endoscopy;  Laterality: N/A;  . Esophagogastroduodenoscopy N/A 04/12/2013    Procedure: ESOPHAGOGASTRODUODENOSCOPY (EGD);  Surgeon: Malissa Hippo, MD;  Location: [facility];  Service: Endoscopy;  Laterality: N/A;   Social History:  reports that he quit smoking about 20 months ago. His smoking use included Cigarettes. He has a 30 pack-year smoking history. He quit smokeless tobacco use about 11 months ago. He reports that he does not drink alcohol or use illicit drugs.   Allergies  Allergen Reactions  . Tylenol [Acetaminophen] Other (See Comments)    cirrhosis    Family History  Problem Relation Age of Onset  . Kidney failure Neg Hx   . CAD Father   . High blood pressure Brother   . Gout Brother     Prior to Admission medications   Medication Sig Start Date End Date Taking? Authorizing Provider  haloperidol (HALDOL) 2 MG tablet Take 1 tablet (2 mg total) by mouth 2 (two) times daily as needed. 04/16/13  Yes Esperanza Sheets, MD  hydrocortisone valerate ointment (WESTCORT) 0.2 % Apply 1 application topically every Monday, Wednesday, and Friday. For numbing prior to dialysis   Yes Historical Provider, MD  lactulose (CHRONULAC) 10 GM/15ML solution Take 45 mLs (30 g total) by mouth 2 (two) times daily. 04/16/13  Yes Esperanza Sheets, MD  LORazepam (ATIVAN) 1 MG tablet Take 1 tablet (1 mg total) by mouth every 8 (eight) hours. 04/16/13  Yes Esperanza Sheets, MD  metoCLOPramide (REGLAN) 5 MG tablet Take 1 tablet (5 mg total) by mouth 4 (four) times daily -  before meals and at bedtime. 03/16/13  Yes Standley Brooking, MD  morphine 2 MG/ML injection Inject 1 mL (2 mg total) into the vein every 4 (four) hours as needed. 04/16/13  Yes Esperanza Sheets, MD   ondansetron (ZOFRAN) 4 MG tablet Take 4 mg by mouth daily as needed for nausea.    Yes Historical Provider, MD  oxyCODONE (ROXICODONE) 5 MG/5ML solution Take 5 mLs (5 mg total) by mouth every 4 (four) hours as needed for pain. 04/16/13  Yes Esperanza Sheets, MD  pantoprazole (PROTONIX) 40 MG tablet Take 1 tablet (40 mg total) by mouth 2 (two) times daily before a meal. 02/27/13  Yes Richarda Overlie, MD   Physical Exam: Filed Vitals:   May 02, 2013 0619  BP: 49/29  Pulse: 113  Temp: 97.5 F (36.4 C)  Resp: 28     General:  Unresponsive, agonal breaths  Eyes: pupils are equal, round  ENT: mucous membranes are dry  Neck: supple  Cardiovascular: s1, s2, rrr  Respiratory: diminished breath sounds at bases with rhonchi in upper airways  Abdomen: soft, distended bs +  Skin: cool to touch, no rashes  Musculoskeletal: diffuse anasarca  Psychiatric: unable to assess due to mental status  Neurologic: unresponsive, cannot cooperate with exam  Labs on Admission:  Basic Metabolic Panel:  Recent Labs Lab 04/12/13 0013 04/13/13 0555 04/14/13 0732 05/02/13 9147  NA  --  137 134* 132*  K  --  3.8 4.3 5.6*  CL  --  100 99 93*  CO2  --  28 26 19   GLUCOSE  --  252* 261* 238*  BUN  --  32* 29* 64*  CREATININE  --  3.98* 3.53* 6.36*  CALCIUM  --  8.5 7.8* 9.0  PHOS 2.8  --   --   --    Liver Function Tests:  Recent Labs Lab 04-27-13 0256  AST 97*  ALT 19  ALKPHOS 244*  BILITOT 0.9  PROT 5.5*  ALBUMIN 1.0*   No results found for this basename: LIPASE, AMYLASE,  in the last 168 hours No results found for this basename: AMMONIA,  in the last 168 hours CBC:  Recent Labs Lab 04/12/13 0535 04/12/13 1144 04/12/13 1859 04/13/13 0555 04/14/13 0732 04-27-13 0256  WBC 8.5  --   --   --  8.0 46.3*  NEUTROABS  --   --   --   --   --  41.1*  HGB 9.1* 9.1* 8.6* 8.1* 6.5* 5.1*  HCT 27.6* 27.4* 25.5* 24.6* 19.4* 15.4*  MCV 92.6  --   --   --  92.4 98.7  PLT 100*  --   --   --   91* 253   Cardiac Enzymes: No results found for this basename: CKTOTAL, CKMB, CKMBINDEX, TROPONINI,  in the last 168 hours  BNP (last 3 results)  Recent Labs  11/21/12 1145 27-Apr-2013 0256  PROBNP 1167.0* 1756.0*   CBG:  Recent Labs Lab 04/14/13 1635 04/14/13 2112 04/15/13 0747 04/15/13 1136 04/15/13 1629  GLUCAP 329* 274* 205* 214* 292*    Radiological Exams on Admission: Dg Chest Port 1 View  Apr 27, 2013   *RADIOLOGY REPORT*  Clinical Data: Dyspnea; respiratory distress.  PORTABLE CHEST - 1 VIEW  Comparison: Chest radiograph performed 04/07/2013  Findings: The lungs are relatively well-aerated.  Vascular congestion is noted, with increased interstitial markings, likely reflecting slightly worsened interstitial edema.  Small bilateral pleural effusions are suspected.  No pneumothorax is seen.  The cardiomediastinal silhouette is borderline enlarged.  No acute osseous abnormalities identified.  IMPRESSION: Vascular congestion and borderline cardiomegaly, with increased interstitial markings, likely reflecting slightly worsened interstitial edema.  Suspect small bilateral pleural effusions.   Original Report Authenticated By: Tonia Ghent, M.D.   Dg Chest Port 1v Same Day  April 27, 2013   *RADIOLOGY REPORT*  Clinical Data: Central line placement.  PORTABLE CHEST - 1 VIEW SAME DAY  Comparison: Chest radiograph performed earlier today at 03:29 a.m.  Findings: The patient's left IJ line is noted ending about the proximal SVC.  There is mildly worsened bibasilar airspace opacity, though this may reflect technique.  This could reflect mildly worsened interstitial edema or possibly pneumonia.  Underlying vascular congestion is noted.  Small bilateral pleural effusions are again suspected, though the left lung base is not completely imaged.  The heart mediastinal silhouette is borderline enlarged.  No acute osseous abnormalities are seen.  IMPRESSION:  1.  Left IJ line noted ending about the  proximal SVC. 2.  Apparent mildly worsened bibasilar airspace opacities may reflect mildly worse interstitial edema or possibly pneumonia. 3.  Underlying vascular congestion and borderline cardiomegaly; likely small bilateral pleural effusions.   Original Report Authenticated By: Tonia Ghent, M.D.     Assessment/Plan Principal Problem:   Acute respiratory failure Active Problems:   Alcoholic cirrhosis/remote TIPS   Acute blood loss anemia   GI bleed  GAVE (gastric antral vascular ectasia)   ESRD on dialysis   Hyponatremia   Encephalopathy, hepatic   Hyperkalemia   Sepsis   1. This patient has multiple medical problems. His current presentation is likely multifactorial with presumed hepatic encephalopathy, pulmonary edema due to volume overload, secondary to end-stage renal disease. Patient's last dialysis session was 5 days ago. He also has severe anemia which is likely due to chronic blood loss. With severe leukocytosis, hemodynamic instability, he likely has a comment of sepsis as well. Due to his multiple comorbidities, extremely poor prognosis, comfort measures appears to be appropriate. He'll be managed in the hospital with analgesics, IV hydromorphone, atropine drops for respiratory secretions and nebulizer treatments as needed. In his current state, I anticipate that his prognosis would be a few hours to a day. We'll continue to monitor for stability. Anticipate in hospital death.  Code Status: DNR, comfort measures Family Communication: discussed with family at the bedside Disposition Plan: observe for the next 24-48 hours.  Anticipate in hospital death  Time spent:  [facility] Pager [phone]  If 7PM-7AM, please contact night-coverage [email or web address] Password [email or web address] 05/18/13, 10:21 AM

## 2013-05-12 NOTE — Progress Notes (Signed)
Called Donor services: confirmation # [phone] (spoke with Milas Gain) Told of pt's wishes to be donated to science (informed by family) and that [employer] has been contacted.

## 2013-05-12 NOTE — Discharge Summary (Signed)
Death Summary  Timothy Chan NWG:[id number] DOB: 1951/04/01 DOA: April 27, 2013  PCP: Ignatius Specking., MD  Admit date: April 27, 2013 Date of Death: 04-27-13  Final Diagnoses:  Principal Problem:   Acute respiratory failure Active Problems:   Alcoholic cirrhosis/remote TIPS   Acute blood loss anemia   GI bleed   GAVE (gastric antral vascular ectasia)   ESRD on dialysis   Hyponatremia   Encephalopathy, hepatic   Hyperkalemia   Sepsis   History of present illness and hospital course:   Timothy Chan is a 62 y.o. male who is well-known to our service, with history of end-stage liver disease, end-stage renal disease, chronic GI blood loss with transfusion-dependent anemia. The patient was recently in the hospital and was discharged on 10/6 back to his nursing home for comfort measures. His family reports that he was conversing appropriately at that time. He did not complain of shortness of breath, did not have any fever. Over the last 48 hours, patient has become progressively lethargic. He is now unresponsive. According to the family, they were contacted last night by the nursing facility and were told the patient was having difficulty breathing and it was likely that he had respiratory secretions. Family requested transfer to the emergency room for further management, since they felt that this could not be managed at the nursing home. On arrival to the emergency room, patient was noted to be tachycardic, hypotensive, obtunded. Admission was requested for further comfort measures.  Patient was admitted to the medical floor for comfort measures.  He was given opiates for resp distress and atropine drops for resp secretions.  Within a few hours from admission, patient was pronounced dead by nursing staff. Family was present in the room.  Patient was already on comfort measures/hospice so case was not referred to medical examiner.   Time:  Signed:  MEMON,JEHANZEB  [facility] 04/27/2013,  7:17 PM

## 2013-05-12 NOTE — ED Notes (Signed)
Unable to obtain vascular access.  Respiratory at bedside to perform arterial stick to obtain ABG and labs.

## 2013-05-12 NOTE — Progress Notes (Signed)
UR Chart Review Completed  

## 2013-05-12 NOTE — Progress Notes (Signed)
Dr. Kerry Hough was texted to be told of pt's demise. Pt will be transported to morgue in [facility], awaiting for family to make arrangement for transport to [facility].

## 2013-05-12 DEATH — deceased

## 2014-08-12 IMAGING — CT CT ABDOMEN WO/W CM
2 of 9 series · 10 of 46 positions shown, 16 images · IV contrast (omnipaque)
Comparison: 09/03/2010
Correlation:  Ultrasound abdomen 08/10/2012

CLINICAL DATA: Abnormal ultrasound question lesion right lobe liver
adjacent to TIPS shunt, history of end-stage renal disease on
dialysis, alcoholic liver disease/cirrhosis, hypertension, diabetes

CT ABDOMEN WITHOUT AND WITH CONTRAST
TECHNIQUE: Multidetector CT imaging of the abdomen was performed
following the standard protocol before and during bolus
administration of intravenous contrast. Sagittal and coronal MPR
images reconstructed from axial data set.  Imaging of the pelvis
was inadvertently performed, reported below.
Contrast: 100mL OMNIPAQUE IOHEXOL 300 MG/ML  SOLN Dilute oral
contrast.

[Series 4: mpr arterial cor (id) · coronal · arterial · 0.53mm/px · 3 of 99 slices shown, 4 images]
[im 25/99  soft-tissue]
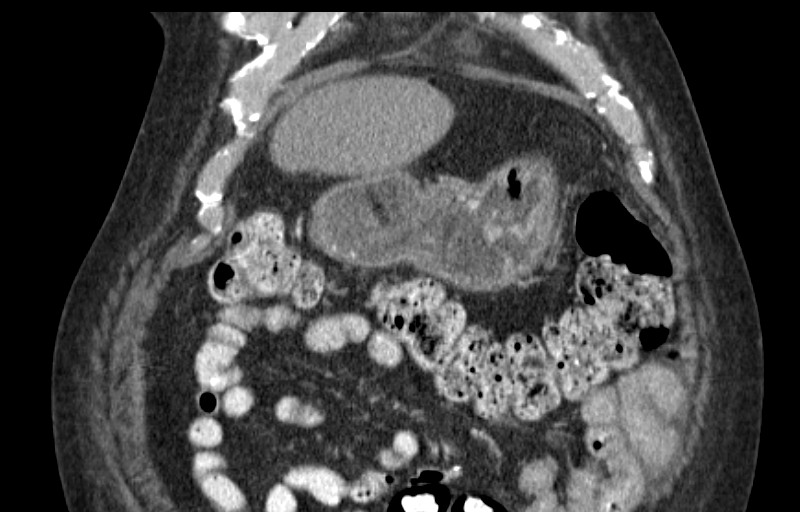
[im 50/99  soft-tissue]
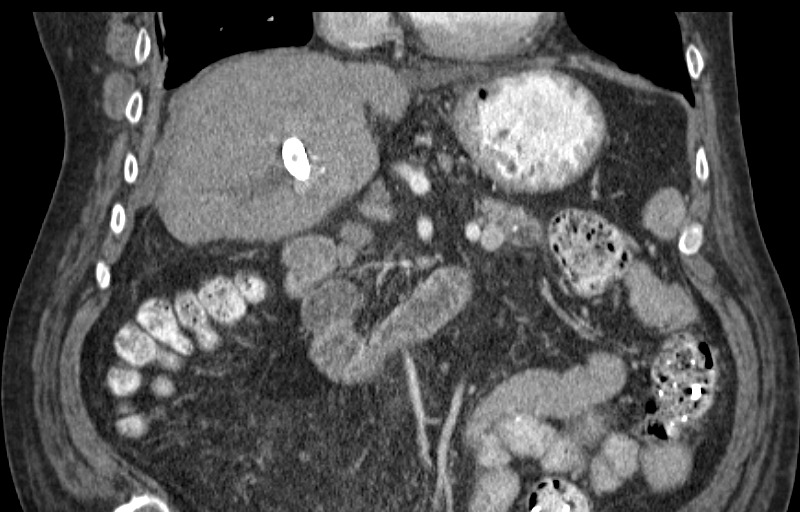
[im 50/99  bone]
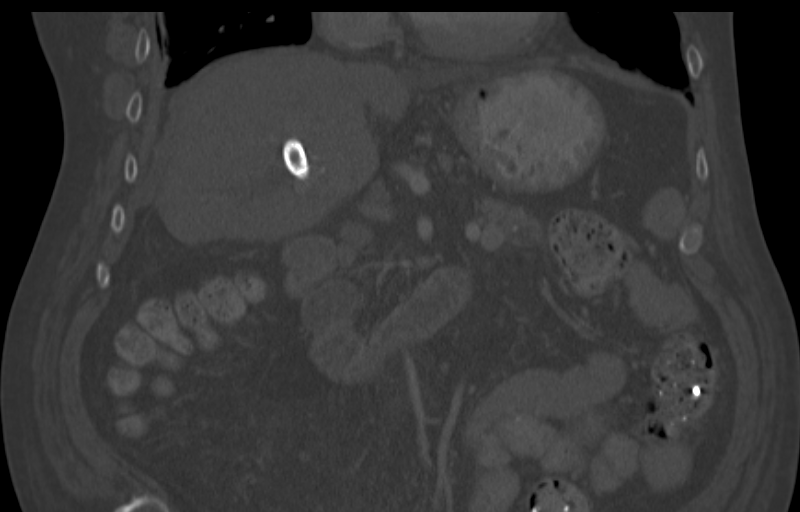
[im 74/99  soft-tissue]
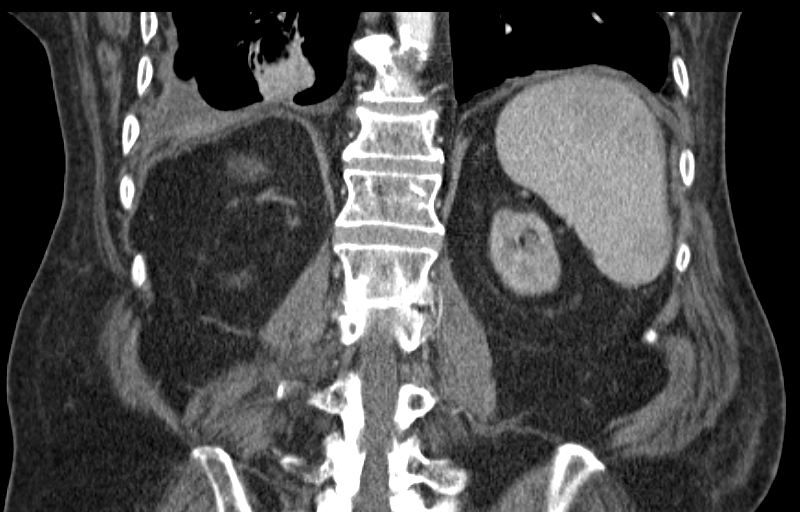

[Series 7: venous 60 sec 3.0 b40f · axial · portal-venous · 0.86mm/px · z∈[-434,-59]mm · 7 of 167 slices shown, 12 images]
[im 21/167  soft-tissue]
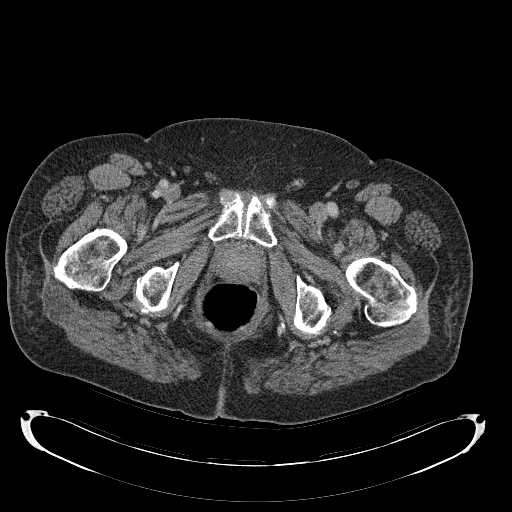
[im 21/167  bone]
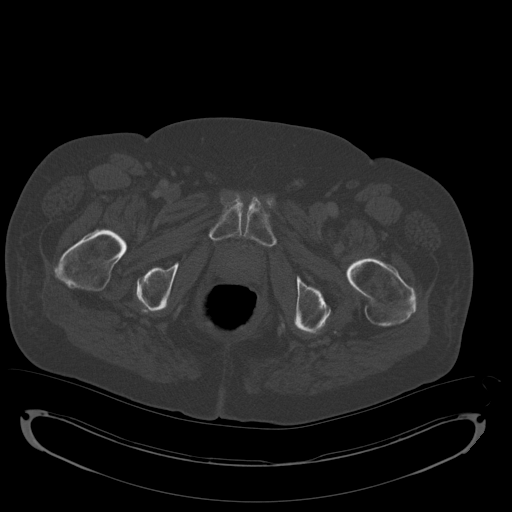
[im 42/167  soft-tissue]
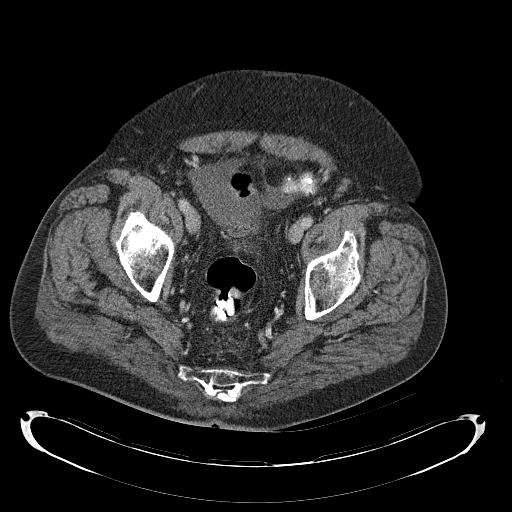
[im 63/167  soft-tissue]
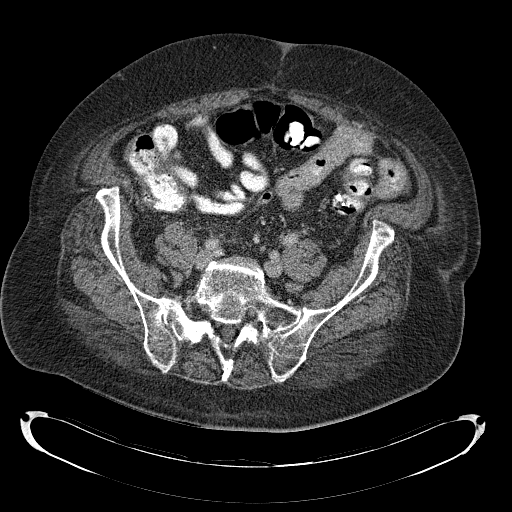
[im 84/167  soft-tissue]
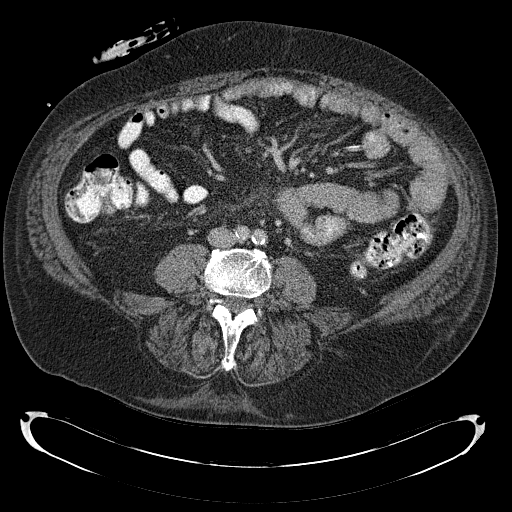
[im 84/167  lung]
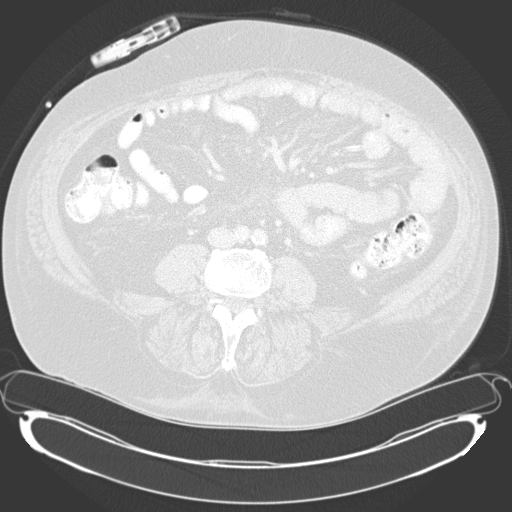
[im 104/167  soft-tissue]
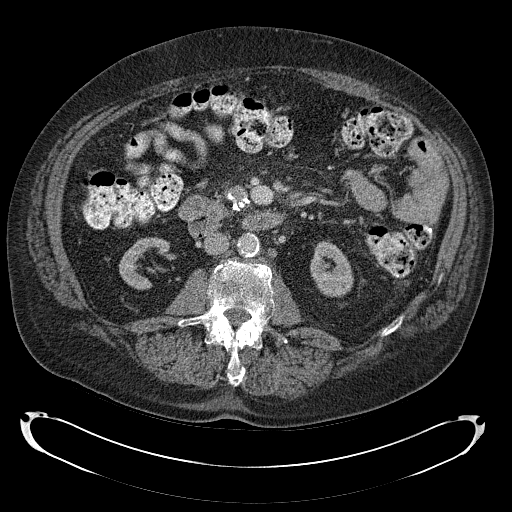
[im 104/167  lung]
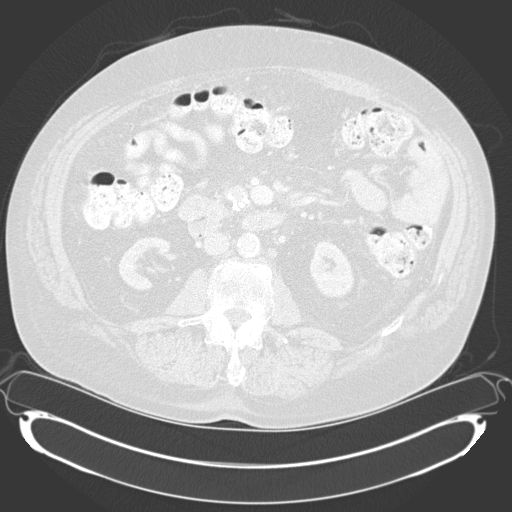
[im 125/167  soft-tissue]
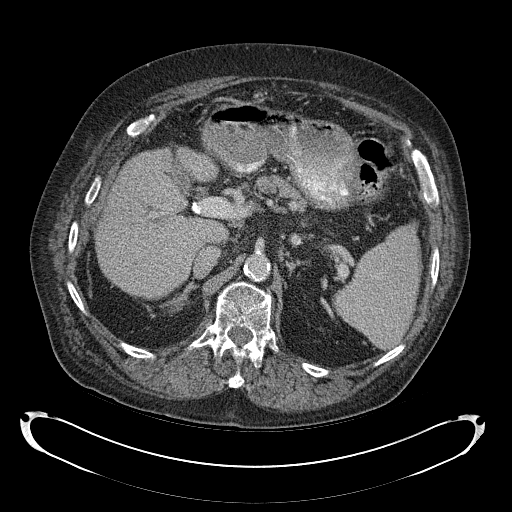
[im 125/167  lung]
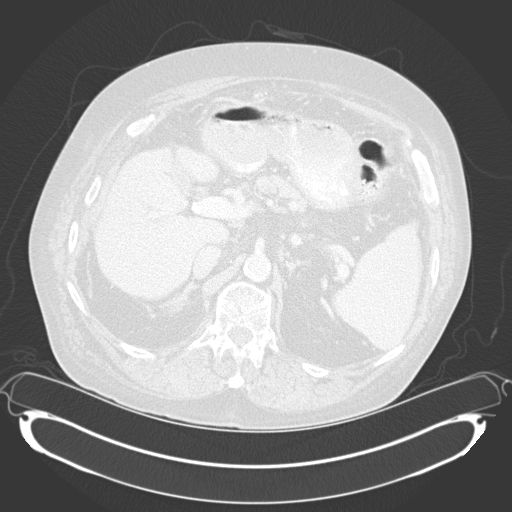
[im 146/167  soft-tissue]
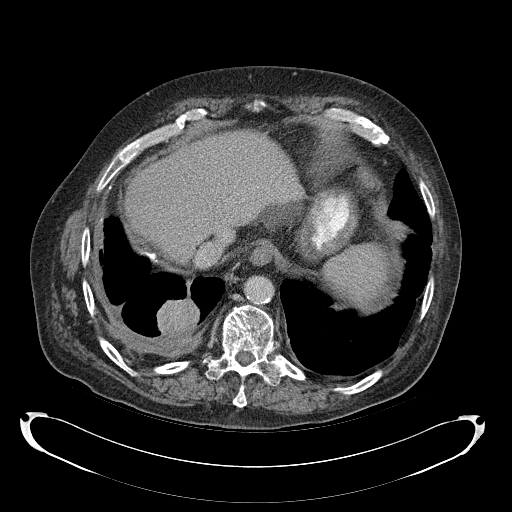
[im 146/167  lung]
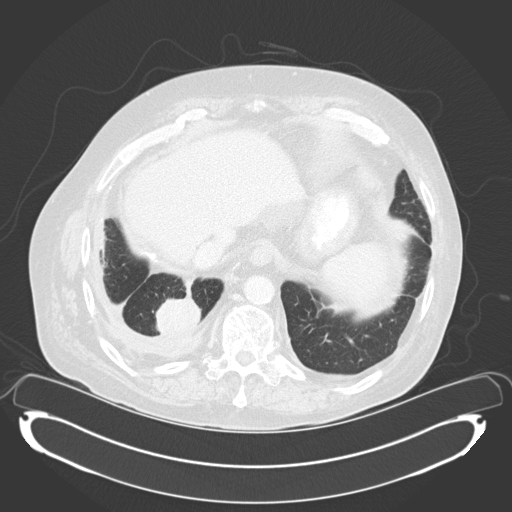

[10 of 46 positions shown; findings below may reference images not displayed]

FINDINGS: Small right pleural effusion with a rounded opacity in the right
lower lobe, 4.7 x 2.8 cm in size, potentially representing rounded
atelectasis, unchanged since 4504.
Minimal pleural thickening at the inferior right hemithorax is
unchanged.
Chronic haziness of the mesentery is unchanged.

TIPS shunt identified in right lobe liver extending to the IVC,
appearance unchanged.
Cirrhotic liver with no focal hepatic mass lesion identified.
Specifically, no CT abnormalities or abnormal enhancement are seen
adjacent to the cranial aspect of the tip shunt to suggest mass.
Remainder of liver, spleen, atrophic kidneys, and adrenal glands
normal appearance.
Scattered parenchymal calcifications are seen within the pancreas
with ductal dilatation at the pancreatic tail compatible with
chronic calcific pancreatitis.

Small duodenal diverticulum.
Scattered small upper abdominal varices.
No definite enhancing esophageal varices noted.
Scattered atherosclerotic calcifications aorta and coronary
arteries.
Peritoneal dialysis catheter present.
Normal appendix.
Small amount of pelvic ascites, nonspecific, may be related to
dialysate.
Thickening of bladder wall increased since previous exam.
Stomach and bowel loops normal appearance.
No mass, adenopathy, hernia or acute bony lesion.
IMPRESSION: Cirrhotic appearing liver with a TIPS shunt and small scattered
upper abdominal varices.

No focal hepatic mass identified.
Chronic calcific pancreatitis with ductal dilatation at pancreatic
tail.
Chronic small right pleural effusion with chronic pleural
thickening / enhancement and adjacent chronic rounded atelectasis
right lower lobe unchanged since 4504. Peritoneal dialysis catheter
with minimal scattered free fluid question dialysate.
Increased bladder wall thickening, nonspecific.

## 2014-11-01 NOTE — Op Note (Signed)
PATIENT NAME:  Timothy Chan, Mani MR#:  [id number] DATE OF BIRTH:  1951/02/07  DATE OF PROCEDURE:  07/20/2012  PREOPERATIVE DIAGNOSES: 1. End-stage renal disease.  2. Hypertension.  3. Diabetes.   POSTOPERATIVE DIAGNOSES: 1. End-stage renal disease.  2. Hypertension.  3. Diabetes.   PROCEDURE PERFORMED: Laparoscopic-assisted peritoneal dialysis catheter placement.   SURGEON: Annice NeedyJason S. Rodert Hinch, M.D.   ANESTHESIA: General.   ESTIMATED BLOOD LOSS: Minimal.   INDICATION FOR PROCEDURE: This is a 64 year old male with end-stage renal disease. He desires switch from hemodialysis to peritoneal dialysis. The risks and benefits of peritoneal dialysis and catheter placement were discussed and informed consent was obtained.   DESCRIPTION OF PROCEDURE: The patient is brought to the operative suite and after an adequate level of general anesthesia was obtained the abdomen was sterilely prepped and draped and a sterile surgical field was created. A small incision was made in a transverse fashion just to the right of the umbilicus. He preferred to have the catheter on the right as he slept on his left side. We dissected down to the fascia and put a Vicryl pursestring suture in at this point. I then made a small incision in the left upper quadrant and used the Optiview 11 mm trocar to enter the peritoneal cavity. The peritoneum was then insufflated with carbon dioxide. Using a trocar the peritoneal dialysis catheter was then placed through the fascial incision just to the right of the umbilicus, in the middle of pursestring suture, and placed into the pelvis under direct visualization. The deep cuff was then secured at the fascial layer and the catheter was brought out in the right abdomen with the second cuff between the periumbilical incision and the exit site. The appropriate distal titanium connector was placed and we instilled 500 mL of sterile saline and immediately it ran well. The bag was then placed on the floor  and almost the entire 500 mL returned with a matter of a couple of minutes. During this time the abdomen was desufflated, the wounds were closed with 4-0 Vicryl. The appropriate distal connectors were then placed with the peritoneal dialysis catheter and sterile dressings were then placed. The patient tolerated the procedure well and was taken to the recovery room in stable condition. ____________________________ Annice NeedyJason S. Tywana Robotham, MD jsd:sb D: 07/20/2012 13:06:45 ET T: 07/20/2012 13:12:57 ET JOB#: [id number]  cc: Annice NeedyJason S. Graci Hulce, MD, <Dictator> Annice NeedyJASON S Garnell Begeman MD ELECTRONICALLY SIGNED 07/21/2012 8:33

## 2015-03-04 IMAGING — CR DG KNEE 1-2V*L*
2 series · 2 of 2 positions shown · non-contrast
Comparison: None.

CLINICAL DATA: Left knee pain and swelling.

LEFT KNEE - 1-2 VIEW

[ap]
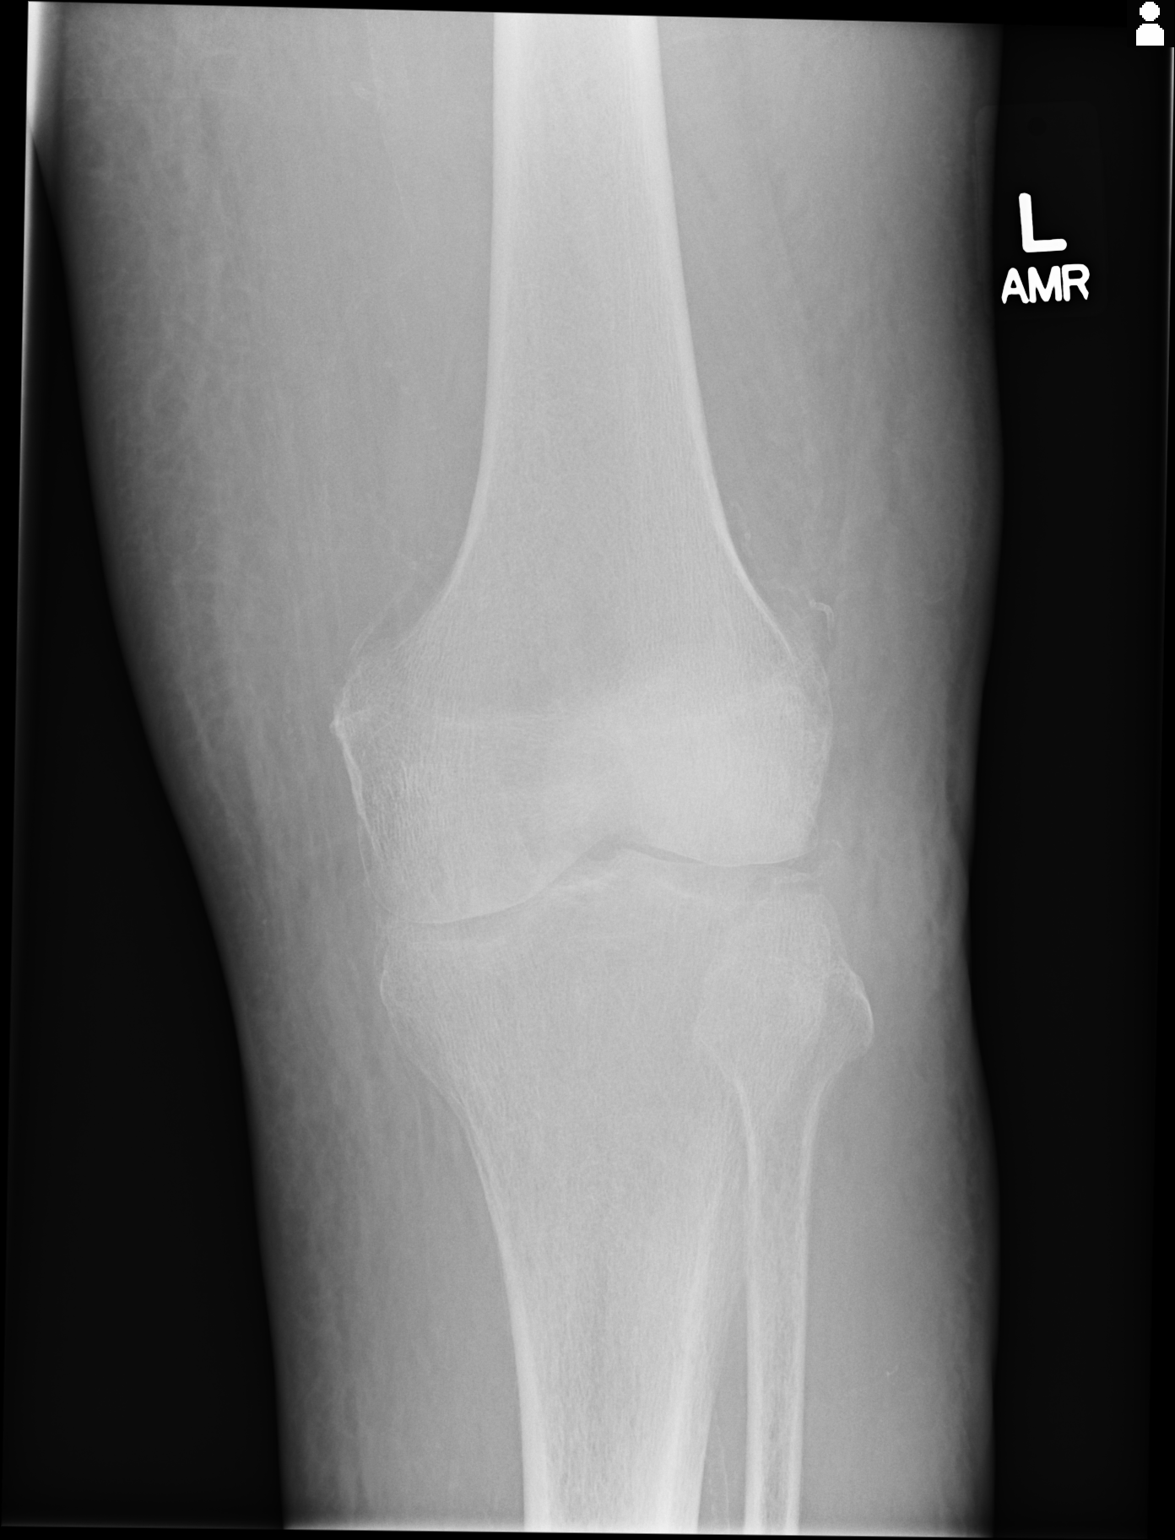

[lat]
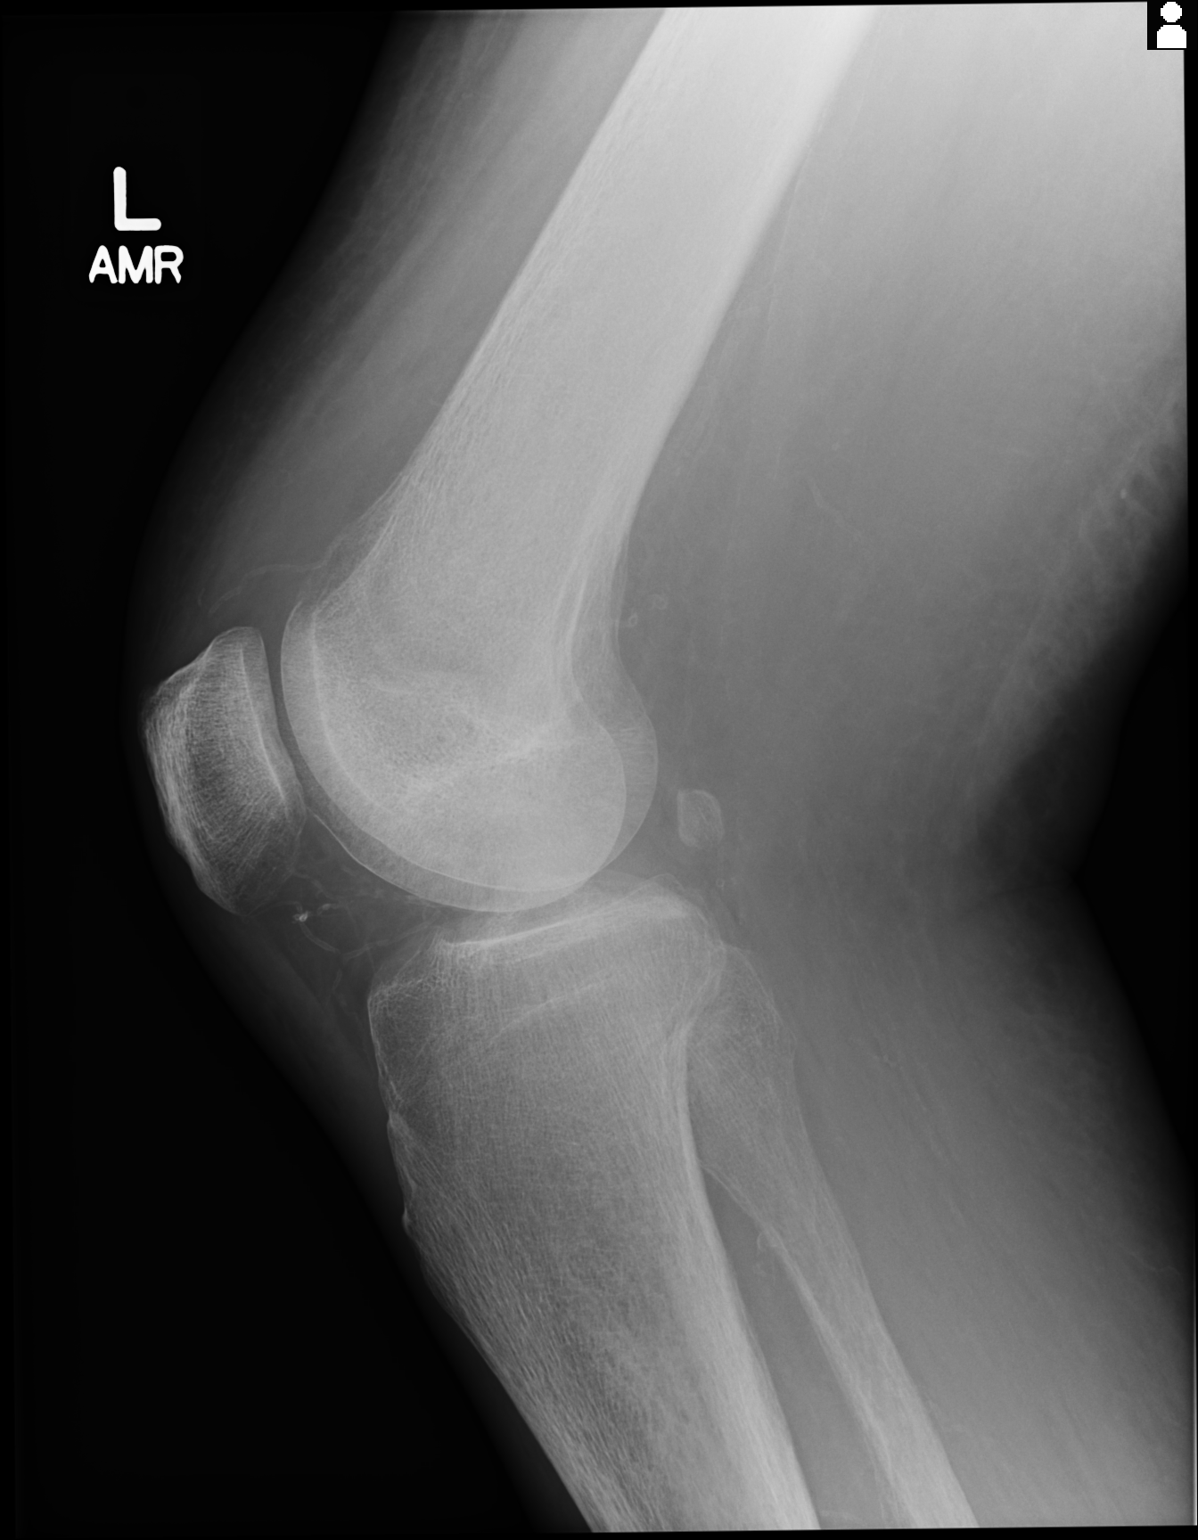

[2 of 2 positions shown; findings below may reference images not displayed]

FINDINGS: There is evidence of moderate osteoarthritis involving
medial and lateral joint spaces.  Vascular calcifications are
present.  No fracture, dislocation or bony lesion is identified.
There may be a small amount of joint fluid in the suprapatellar
region.  Soft tissues appear diffusely edematous.
IMPRESSION: Osteoarthritis.  No acute bony findings.  Potential small amount of
joint fluid in the suprapatellar region.
